# Patient Record
Sex: Male | Born: 1940
Health system: Southern US, Community
[De-identification: ages and names within clinical notes are randomized; demographics above are authoritative.]

## PROBLEM LIST (undated history)

## (undated) DIAGNOSIS — Z972 Presence of dental prosthetic device (complete) (partial): Secondary | ICD-10-CM

## (undated) DIAGNOSIS — R7303 Prediabetes: Secondary | ICD-10-CM

## (undated) DIAGNOSIS — F321 Major depressive disorder, single episode, moderate: Secondary | ICD-10-CM

## (undated) DIAGNOSIS — H269 Unspecified cataract: Secondary | ICD-10-CM

## (undated) DIAGNOSIS — I10 Essential (primary) hypertension: Secondary | ICD-10-CM

## (undated) DIAGNOSIS — F419 Anxiety disorder, unspecified: Secondary | ICD-10-CM

## (undated) DIAGNOSIS — H409 Unspecified glaucoma: Secondary | ICD-10-CM

## (undated) DIAGNOSIS — J449 Chronic obstructive pulmonary disease, unspecified: Secondary | ICD-10-CM

## (undated) DIAGNOSIS — E785 Hyperlipidemia, unspecified: Secondary | ICD-10-CM

## (undated) DIAGNOSIS — C4491 Basal cell carcinoma of skin, unspecified: Secondary | ICD-10-CM

## (undated) HISTORY — DX: Prediabetes: R73.03

## (undated) HISTORY — DX: Anxiety disorder, unspecified: F41.9

## (undated) HISTORY — PX: TONSILLECTOMY: SUR1361

## (undated) HISTORY — DX: Basal cell carcinoma of skin, unspecified: C44.91

## (undated) HISTORY — PX: EYE SURGERY: SHX253

## (undated) HISTORY — DX: Chronic obstructive pulmonary disease, unspecified: J44.9

## (undated) HISTORY — DX: Hyperlipidemia, unspecified: E78.5

## (undated) HISTORY — PX: WISDOM TOOTH EXTRACTION: SHX21

## (undated) HISTORY — DX: Unspecified glaucoma: H40.9

---
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
## 2001-03-14 ENCOUNTER — Encounter: Payer: Self-pay | Admitting: Family Medicine

## 2001-03-14 ENCOUNTER — Ambulatory Visit (HOSPITAL_COMMUNITY): Admission: RE | Admit: 2001-03-14 | Discharge: 2001-03-14 | Payer: Self-pay | Admitting: Family Medicine

## 2001-03-21 ENCOUNTER — Encounter: Payer: Self-pay | Admitting: Family Medicine

## 2001-03-21 ENCOUNTER — Ambulatory Visit (HOSPITAL_COMMUNITY): Admission: RE | Admit: 2001-03-21 | Discharge: 2001-03-21 | Payer: Self-pay | Admitting: Family Medicine

## 2002-03-26 ENCOUNTER — Other Ambulatory Visit: Admission: RE | Admit: 2002-03-26 | Discharge: 2002-03-26 | Payer: Self-pay | Admitting: Dermatology

## 2002-04-20 ENCOUNTER — Ambulatory Visit (HOSPITAL_COMMUNITY): Admission: RE | Admit: 2002-04-20 | Discharge: 2002-04-20 | Payer: Self-pay | Admitting: Internal Medicine

## 2002-05-27 ENCOUNTER — Other Ambulatory Visit: Admission: RE | Admit: 2002-05-27 | Discharge: 2002-05-27 | Payer: Self-pay | Admitting: Dermatology

## 2003-07-29 ENCOUNTER — Other Ambulatory Visit: Admission: RE | Admit: 2003-07-29 | Discharge: 2003-07-29 | Payer: Self-pay | Admitting: Unknown Physician Specialty

## 2005-03-07 ENCOUNTER — Ambulatory Visit (HOSPITAL_COMMUNITY): Admission: RE | Admit: 2005-03-07 | Discharge: 2005-03-07 | Payer: Self-pay | Admitting: Family Medicine

## 2005-03-13 ENCOUNTER — Other Ambulatory Visit: Admission: RE | Admit: 2005-03-13 | Discharge: 2005-03-13 | Payer: Self-pay | Admitting: Dermatology

## 2005-11-13 ENCOUNTER — Ambulatory Visit: Payer: Self-pay | Admitting: *Deleted

## 2005-11-23 ENCOUNTER — Ambulatory Visit: Payer: Self-pay | Admitting: *Deleted

## 2005-11-23 ENCOUNTER — Encounter (HOSPITAL_COMMUNITY): Admission: RE | Admit: 2005-11-23 | Discharge: 2005-12-23 | Payer: Self-pay | Admitting: *Deleted

## 2005-11-30 ENCOUNTER — Ambulatory Visit: Payer: Self-pay | Admitting: *Deleted

## 2005-12-03 ENCOUNTER — Ambulatory Visit: Payer: Self-pay | Admitting: *Deleted

## 2005-12-03 ENCOUNTER — Inpatient Hospital Stay (HOSPITAL_BASED_OUTPATIENT_CLINIC_OR_DEPARTMENT_OTHER): Admission: RE | Admit: 2005-12-03 | Discharge: 2005-12-03 | Payer: Self-pay | Admitting: *Deleted

## 2005-12-30 HISTORY — PX: CARDIAC CATHETERIZATION: SHX172

## 2005-12-31 ENCOUNTER — Ambulatory Visit: Payer: Self-pay | Admitting: *Deleted

## 2007-03-17 ENCOUNTER — Ambulatory Visit (HOSPITAL_COMMUNITY): Admission: RE | Admit: 2007-03-17 | Discharge: 2007-03-17 | Payer: Self-pay | Admitting: Ophthalmology

## 2007-03-17 ENCOUNTER — Encounter (INDEPENDENT_AMBULATORY_CARE_PROVIDER_SITE_OTHER): Payer: Self-pay | Admitting: Ophthalmology

## 2008-04-20 ENCOUNTER — Ambulatory Visit (HOSPITAL_BASED_OUTPATIENT_CLINIC_OR_DEPARTMENT_OTHER): Admission: RE | Admit: 2008-04-20 | Discharge: 2008-04-20 | Payer: Self-pay | Admitting: Orthopedic Surgery

## 2008-04-20 ENCOUNTER — Encounter (INDEPENDENT_AMBULATORY_CARE_PROVIDER_SITE_OTHER): Payer: Self-pay | Admitting: Orthopedic Surgery

## 2008-07-02 HISTORY — PX: HAND SURGERY: SHX662

## 2009-04-15 ENCOUNTER — Ambulatory Visit (HOSPITAL_COMMUNITY): Admission: RE | Admit: 2009-04-15 | Discharge: 2009-04-15 | Payer: Self-pay | Admitting: Family Medicine

## 2009-12-26 DIAGNOSIS — J449 Chronic obstructive pulmonary disease, unspecified: Secondary | ICD-10-CM

## 2009-12-26 DIAGNOSIS — R1032 Left lower quadrant pain: Secondary | ICD-10-CM | POA: Insufficient documentation

## 2009-12-26 DIAGNOSIS — E781 Pure hyperglyceridemia: Secondary | ICD-10-CM

## 2009-12-27 ENCOUNTER — Ambulatory Visit: Payer: Self-pay | Admitting: Urgent Care

## 2009-12-27 DIAGNOSIS — K589 Irritable bowel syndrome without diarrhea: Secondary | ICD-10-CM

## 2009-12-27 DIAGNOSIS — F411 Generalized anxiety disorder: Secondary | ICD-10-CM

## 2009-12-27 DIAGNOSIS — R195 Other fecal abnormalities: Secondary | ICD-10-CM | POA: Insufficient documentation

## 2009-12-29 ENCOUNTER — Encounter: Payer: Self-pay | Admitting: Internal Medicine

## 2009-12-30 HISTORY — PX: COLONOSCOPY: SHX174

## 2010-01-05 ENCOUNTER — Encounter: Payer: Self-pay | Admitting: Internal Medicine

## 2010-01-17 ENCOUNTER — Ambulatory Visit: Payer: Self-pay | Admitting: Internal Medicine

## 2010-01-17 ENCOUNTER — Ambulatory Visit (HOSPITAL_COMMUNITY): Admission: RE | Admit: 2010-01-17 | Discharge: 2010-01-17 | Payer: Self-pay | Admitting: Internal Medicine

## 2010-01-19 ENCOUNTER — Encounter: Payer: Self-pay | Admitting: Internal Medicine

## 2010-01-20 ENCOUNTER — Encounter: Payer: Self-pay | Admitting: Internal Medicine

## 2010-08-03 NOTE — Assessment & Plan Note (Signed)
Summary: CONSULT TCS/LLQ PAIN X 1 MONTH/SS   Visit Type:  Initial Consult Referring Provider:  Dr [name] Primary Care Provider:  Dr [name]  Chief Complaint:  abd pain, 1 month ago, and time for tcs.  History of Present Illness: c/o constipation, LLQ pain lasted approx 10 days.  Took clear lax prn from [employer] and eased pain.  Was having BM q 3 days, lifelong constipation.  Tried fiber previously.  Occ diarrhea.  Hx IBS.  Denies rectal bleeding or melena.  Denies fever or chills.  Was eating a lot popcorn.  Denies heartburn, indigestion, nausea or vomiting.  Weight down 13# intentionally over past 8 months by decreasing calories & mowing yards.    Current Problems (verified): 1)  Ibs  (ICD-564.1) 2)  Anxiety  (ICD-300.00) 3)  Abdominal Pain, Left Lower Quadrant  (ICD-789.04) 4)  Hypertriglyceridemia  (ICD-272.1) 5)  COPD  (ICD-496)  Current Medications (verified): 1)  Ambien 10 Mg Tabs (Zolpidem Tartrate) .... At Bedtime As Needed 2)  Saw Palmetto Plus  Caps (Misc Natural Products) .... At Bedtime 3)  Flaxseed  Misc (Flaxseed) .... Two Times A Day 4)  Pravastatin Sodium 20 Mg Tabs (Pravastatin Sodium) .... Once Daily 5)  Fenofibrate 160 Mg Tabs (Fenofibrate) .... Once Daily  Allergies (verified): No Known Drug Allergies  Past History:  Past Medical History: ANXIETY (ICD-300.00) ABDOMINAL PAIN, LEFT LOWER QUADRANT (ICD-789.04) HYPERTRIGLYCERIDEMIA (ICD-272.1) COPD (ICD-496) BPH Basal cell skin ca IBS  Family History: No known family history of colorectal carcinoma, IBD, liver problems. 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 quit 3 yrs ago 60 pkyr hx Alcohol Use - no Illicit Drug Use - no Patient gets regular exercise. Smoking Status:  quit Drug Use:  no Does  Patient Exercise:  yes  Review of Systems General:  Denies fever, chills, sweats, anorexia, fatigue, weakness, malaise, weight loss, and sleep disorder. CV:  Denies chest pains, angina, palpitations, syncope, dyspnea on exertion, orthopnea, PND, peripheral edema, and claudication. Resp:  Complains of dyspnea with exercise; denies dyspnea at rest, cough, sputum, wheezing, coughing up blood, and pleurisy. GI:  Denies difficulty swallowing, pain on swallowing, nausea, indigestion/heartburn, vomiting, vomiting blood, jaundice, gas/bloating, change in bowel habits, bloody BM's, black BMs, and fecal incontinence. GU:  Denies urinary burning, blood in urine, urinary frequency, urinary hesitancy, nocturnal urination, and urinary incontinence. Derm:  Denies rash, itching, dry skin, hives, moles, warts, and unhealing ulcers. Psych:  Denies depression, anxiety, memory loss, suicidal ideation, hallucinations, paranoia, phobia, and confusion. Heme:  Denies bruising, bleeding, and enlarged lymph nodes.  Vital Signs:  Patient profile:   70 year old male Height:      68 inches Weight:      170 pounds BMI:     25.94 Temp:     97.7 degrees F oral Pulse rate:   88 / minute BP sitting:   122 / 84  (left arm) Cuff size:   regular  Vitals Entered By: Hendricks Limes LPN (December 27, 2009 10:20 AM)  Physical Exam  General:  Well developed, well nourished, no acute distress. Head:  Normocephalic and atraumatic x scars left forehead prev lens replacement Eyes:  Sclera clear, no icterus. Ears:  Normal auditory acuity. Nose:  No deformity, discharge,  or lesions. Mouth:  No deformity or lesions, dentition normal. Neck:  Supple; no masses or thyromegaly. Lungs:  Clear throughout to auscultation. Heart:  Regular rate and rhythm; no murmurs, rubs,  or bruits. Abdomen:  Soft, nontender and nondistended. No masses, hepatosplenomegaly or hernias noted. Normal bowel sounds.without guarding and without rebound.     Rectal:  hemoccult positive.  somewhat mod dilated rectum.  No ext/int masses palpated. Msk:  Symmetrical with no gross deformities. Normal posture. Pulses:  Normal pulses noted. Extremities:  No clubbing, cyanosis, edema or deformities noted. Neurologic:  Alert and  oriented x4;  grossly normal neurologically. Skin:  Intact without significant lesions or rashes. Cervical Nodes:  No significant cervical adenopathy. Inguinal Nodes:  No significant inguinal adenopathy. Psych:  Alert and cooperative. Normal mood and affect.  Impression & Recommendations:  Problem # 1:  BLOOD IN STOOL, OCCULT (ICD-792.1) 70 y/o caucasian male w/ hx IBS-constipation predominate had 10-day history of LLQ pain that has resolved. Has was found to be hemocult positive in office today.  We do need to r/o colorectal carcinoma, and other differentials include flare IBS-C w/ benign source for heme positive stool.  Diagnostic colonoscopy to be performed by Dr. Suszanne Conners Rourk in the near future.  I have discussed risks and benefits which include, but are not limited to, bleeding, infection, perforation, or medication reaction.  The patient agrees with this plan and consent will be obtained.  Orders: Consultation Level III (16109) Hemoccult Guaiac-1 spec.(in office) (82270)  Problem # 2:  IBS (ICD-564.1) See #1  Problem # 3:  ABDOMINAL PAIN, LEFT LOWER QUADRANT (ICD-789.04) See #1  Patient Instructions: 1)  If pain returns, please call our office or go directly to ER

## 2010-08-03 NOTE — Letter (Signed)
Summary: referral from dr Lorin Picket luking  referral from dr scott luking   Imported By: Rosine Beat 01/05/2010 15:09:08  _____________________________________________________________________  External Attachment:    Type:   Image     Comment:   External Document

## 2010-08-03 NOTE — Letter (Signed)
Summary: Patient Notice, Colon Biopsy Results  [facility]  [address]   [place], [place] [address]   Phone: [phone]  Fax: [phone]       January 19, 2010   JOFFRE LUCKS [address] [place], [place]  [address] 1941-06-27    Dear Mr. Willets,  I am pleased to inform you that the biopsies taken during your recent colonoscopy did not show any evidence of cancer upon pathologic examination.  Additional information/recommendations:  Continue with the treatment plan as outlined on the day of your exam.  You should have a repeat colonoscopy examination  in 10 years.  Please call us if you are having persistent problems or have questions about your condition that have not been fully answered at this time.  Sincerely,    R. Roetta Sessions MD, FACP [facility] Ph: [phone]    Fax: [phone]   Appended Document: Patient Notice, Colon Biopsy Results mailed letter to pt  Appended Document: Patient Notice, Colon Biopsy Results REMINDER IN COMPUTER

## 2010-08-03 NOTE — Letter (Signed)
Summary: insurance confirmation  insurance confirmation   Imported By: Minna Merritts 01/20/2010 17:40:48  _____________________________________________________________________  External Attachment:    Type:   Image     Comment:   External Document

## 2010-08-03 NOTE — Letter (Signed)
Summary: Internal Other  Internal Other   Imported By: Peggyann Shoals 12/29/2009 14:09:11  _____________________________________________________________________  External Attachment:    Type:   Image     Comment:   External Document

## 2010-10-05 LAB — BLOOD GAS, ARTERIAL
Acid-Base Excess: 0.8 mmol/L (ref 0.0–2.0)
pCO2 arterial: 39.6 mmHg (ref 35.0–45.0)
pH, Arterial: 7.415 (ref 7.350–7.450)
pO2, Arterial: 92.3 mmHg (ref 80.0–100.0)

## 2010-11-14 NOTE — Op Note (Signed)
George Meyer, George Meyer             ACCOUNT NO.:  [id number]   MEDICAL RECORD NO.:  [id number]          PATIENT TYPE:  AMB   LOCATION:  DSC                          FACILITY:  [facility]   PHYSICIAN:  Cindee Salt, M.D.       DATE OF BIRTH:  August 28, 1940   DATE OF PROCEDURE:  04/20/2008  DATE OF DISCHARGE:                               OPERATIVE REPORT   PREOPERATIVE DIAGNOSIS:  Dupuytren contracture, right little finger.   POSTOPERATIVE DIAGNOSIS:  Dupuytren contracture, right little finger.   OPERATION:  Excision of palmar fascia, right little finger and ring  finger with V-Y advancements.   SURGEON:  Cindee Salt, MD   ASSISTANT:  Joaquin Courts. RN   ANESTHESIA:  General.   DATE OF OPERATION:  04/20/2008.   ANESTHESIOLOGIST:  Dr. Jean Rosenthal.   HISTORY:  The patient is a 70 year old male with a history of a palmar  cord contracture of his right little finger.  He is desirous of having  this excised.  He is aware of risks and complications including  infection, recurrence, injury to arteries, nerves, and tendons,  incomplete relief of symptoms, dystrophy, recurrence, and extension.  In  the preoperative area, the patient is seen.  The extremity marked by  both the patient and surgeon.  Antibiotic given.   PROCEDURE:  The patient is brought to the operating room where a general  anesthetic was carried out without difficulty using an LMA.  He was  prepped using DuraPrep, supine position, right arm free.  A time-out was  taken.  The limb was exsanguinated with an Esmarch bandage.  Tourniquet  placed on forearm, was inflated to 250 mmHg.  The planned V-Y  advancements were then marked out on the finger back to the level of the  carpal retinaculum.  The incisions were carried down through  subcutaneous tissue.  Bleeders were electrocauterized.  The dissection  was carried down to the palmar fascia over the retinaculum.  This was  isolated and transected, removing a portion of the cord to the  ring  finger.  The dissection was then carried distally after identification  of the neurovascular bundles both radially and ulnarly.  This was  followed.  The cords were identified.  A large abductor digiti quinti  cord was present along with the cord present around the flexor tendon at  the A2 pulley.  This was excised protecting neurovascular structures.  This was then followed out to the middle phalanx.  The primary cord was  on the ulnar aspect.  Both radial and ulnar digital arteries and nerves  were found to be intact.  The finger came fully straight without  difficulty.  The wound was copiously irrigated with saline.  The V's  were converted to Y's.  A doubled-over  vessel loop drain was placed.  The skin was then closed with interrupted 5-0 Vicryl Rapide sutures.  A  sterile compressive dressing was applied.  Tourniquet deflated.  Bleeders were electrocauterized during the procedure with bipolar.  On  deflation  of the tourniquet, the finger pinked.  A dorsal splint and completion of  the  dressing was performed.  The patient tolerated the procedure well  and was taken to the recovery room for observation in satisfactory  condition.  He will be discharged home to return to [facility] in 1 week on Vicodin.           ______________________________  Cindee Salt, M.D.     GK/MEDQ  D:  04/20/2008  T:  04/20/2008  Job:  [id number]   cc:   Lorin Picket A. Gerda Diss, MD

## 2010-11-14 NOTE — Op Note (Signed)
George Meyer, George Meyer             ACCOUNT NO.:  [id number]   MEDICAL RECORD NO.:  [id number]          PATIENT TYPE:  AMB   LOCATION:  SDS                          FACILITY:  [facility]   PHYSICIAN:  Lanna Poche, M.D. DATE OF BIRTH:  [birth year]   DATE OF PROCEDURE:  03/17/2007  DATE OF DISCHARGE:                               OPERATIVE REPORT   PREOPERATIVE DIAGNOSIS:  Dislocating intraocular lens, left eye.   POSTOPERATIVE DIAGNOSIS:  Dislocating intraocular lens, left eye.   PROCEDURE:  Pars plana vitrectomy, removal of dislocated IOL, secondary  IOL insertion with suture fixation ciliary body, laser photocoagulation  for retinal break, left eye.   SURGEON:  Lanna Poche, M.D.   ASSISTANT:  Bryan Lemma. Lundquist, P.A.   ANESTHESIA:  General endotracheal.   ESTIMATED BLOOD LOSS:  Less than 1 mL.   COMPLICATIONS:  None.   OPERATIVE NOTE:  The patient was taken to the operating room where after  induction of general anesthesia the left eye was prepped and draped in  usual fashion.  Lid speculum was introduced.  Conjunctival peritomy was  developed at 270 degrees sparing the inferonasal quadrant.  Hemostasis  was obtained with eraser cautery.  Sclerotomies were fashioned 3 mm  posterior limbus at 1:30, 10:30 and 4:30.  The suprasclerotomies were  plugged and 4-mm infusion cannula secured at the 4:30 sclerotomy with a  temporary suture of 7-0 Vicryl.  The tip was visually inspected and  found in good position.  A partial-thickness scleral incision of 7 mm  was made 2 mm posterior limbus superiorly and dissecting up to and then  into the anterior chamber for the full cord length.  A temporary suture  of 7-0 Vicryl was placed over this incision.  Landers ring was secured  with 7-0 Vicryl suture at 3 and 9.  Plugs removed and 30-degree  prismatic lens applied to the surface of the eye.  A central core  followed by a peripheral vitrectomy was performed.  There was apparent  posterior hyaloid separation.  The IOL was seen to be displaced  inferiorly.  It was somewhat mobile and then temporarily moved more into  a superior position, but with little manipulation, it was seen to be  fairly unstable and slipped inferiorly again.  It was elected therefore  to remove the IOL or try to place it into the anterior capsular area.  The IOL was brought into anterior capsular area as a support on the  capsule, but the anterior capsule was not deemed to be adequate as the  IOL would be stable in one area but not in the other.  It was elected to  completely remove this IOL.  The suture superiorly was cut. Lens grasped  with a straight forceps and brought out through the opening.  The  opening was temporally sutured.  Landers ring was removed and plugs  placed in the superior hole.  Scleral depression revealed there to be no  clear retinal breaks or tears but several areas of vitreoretinal pumping  temporally.  This was prophylactically surrounded with a  laser.  Attention was then  directed back to the eye where partial-thickness  scleral flaps were made at 3 and 9.  The 10-0 Prolene suture was passed  from one flap to the other with egress guided with a 27-gauge needle.  The suture was externalized, brought out superiorly and tied to both  ends of the IOL.  The IOL was a CZ70BD, serial number [id number] of  19.0 diopters power and found to be free of defect.  Once the end of the  sutures had been secured to the eye lid, the IOL was placed into the  anterior aspect of posterior chamber anterior to the sulcus.  Partial-  thickness scleral bites were made on each side with a 10-0 Prolene and  the IOL secured in place.  It was well centered.  The superior wound was  closed with interrupted stitches of 10-0 nylon.  The sclerotomies were  closed with 7-0 Vicryl.  The flaps were tied down with 7-0 Vicryl.  The  infusion capsule removed and preplaced suture secured.  The  conjunctiva  was brought up and  reapproximated with running sutures of 6-0 plain  gut.  Pressure was checked with a Baer keratometer and found to lay at  approximately 15.  Lid speculum was then removed and subconjunctival  irrigation 0.75% Marcaine followed by subconjunctival injection of 100  mg of ceftazidime and 10 mg of Decadron were given.  Mixed antibiotic  ointment was then placed over the patient's eye.  Eye patch and shield  was placed over the patient's eye, and the patient left the operating  room in stable condition.           ______________________________  Lanna Poche, M.D.     JTH/MEDQ  D:  03/17/2007  T:  03/18/2007  Job:  [id number]

## 2010-11-17 NOTE — Op Note (Signed)
NAME:  George Meyer, George Meyer                       ACCOUNT NO.:  [id number]   MEDICAL RECORD NO.:  [id number]                   PATIENT TYPE:  AMB   LOCATION:  DAY                                  FACILITY:  [facility]   PHYSICIAN:  R. Roetta Sessions, M.D.              DATE OF BIRTH:  [birth year]   DATE OF PROCEDURE:  DATE OF DISCHARGE:                                 OPERATIVE REPORT   PROCEDURE:  Screening colonoscopy.   INDICATION FOR PROCEDURE:  The patient is a 70 year old gentleman sent here  by the courtesy of Scott A. Gerda Diss, M.D., for colorectal cancer screening  via colonoscopy.  The patient is devoid of any lower GI tract symptoms, no  family history of colorectal neoplasia, and he has never had any prior  imaging of his colon.  Colonoscopy is now being done as a standard screening  maneuver.  The approach has been discussed with the patient at length at the  bedside.  The potential risks, benefits, and alternatives have been reviewed  and any questions answered and he is agreeable.  Please see my handwritten  H&P for more information.  He is at low risk for conscious sedation with  Versed and Demerol.   DESCRIPTION OF PROCEDURE:  The patient was placed in the left lateral  decubitus position.  O2 saturation, blood pressure, pulse, and respirations  were monitored throughout the entire procedure.  Conscious sedation with  Versed 6 mg IV, Demerol 75 mg IV in divided doses.   Instrument:  Olympus video chip adult colonoscope.   Findings:  Digital rectal exam revealed no abnormalities.   Endoscopic findings:  Prep was excellent.   Rectum:  Examination of the rectal mucosa, including  retroflexed view and  anteversion, revealed no abnormalities.   Colon:  The colonic mucosa was surveyed from the rectosigmoid junction  through the left, transverse, and right colon to the area of the appendiceal  orifice, the ileocecal valve, and cecum.  These structures were well-seen  and  photographed for the record.  No colonic mucosal abnormalities were  noted upon advancing the scope to the cecum.  From the level of the cecum  and ileocecal valve, the scope was slowly and cautiously withdrawn.  All  previously-mentioned mucosal surfaces were again seen, and again no  abnormalities were observed.  The patient tolerated the procedure well and  was reactivated in endoscopy.    IMPRESSION:  1. Normal rectum.  2. Normal colon.   RECOMMENDATIONS:  Repeat colonoscopy 10 years.                                                 Jonathon Bellows, M.D.    RMR/MEDQ  D:  04/20/2002  T:  04/20/2002  Job:  [id number]   cc:  Scott A. Gerda Diss, M.D.

## 2010-11-17 NOTE — Procedures (Signed)
NAMECOLEBY, YETT NO.:  [id number]   MEDICAL RECORD NO.:  [id number]           PATIENT TYPE:  REC   LOCATION:                                FACILITY:  [facility]   PHYSICIAN:  Vida Roller, M.D.   DATE OF BIRTH:  [birth year]   DATE OF PROCEDURE:  11/23/2005  DATE OF DISCHARGE:  11/23/2005                                    STRESS TEST   HISTORY:  Mr. Mcmanaway is a 70 year old male with no known coronary disease  with atypical chest discomfort.  Cardiac risk factors include age, sex,  tobacco and family history.   BASELINE DATA:  Electrocardiogram reveals a sinus rhythm at 68 beats per  minute with right bundle branch block pattern.   Patient exercised for a total of 9 minutes and 25 seconds and 10.1 METS.  Maximal heart rate achieved was 150 beats per minute which is 97% of  predicted,  maximum blood pressure 150/80, resolved down to 138/80 in  recovery.  The patient reports mild shortness of breath at end of exercise,  no chest discomfort was noted.  No ischemic changes was noted.   Final images and results are pending MD review.      Jae Dire, P.A. [facility]      Vida Roller, M.D.  Electronically Signed    AB/MEDQ  D:  11/23/2005  T:  11/23/2005  Job:  [id number]

## 2010-11-17 NOTE — Procedures (Signed)
NAMEMUNIR, VICTORIAN NO.:  [id number]   MEDICAL RECORD NO.:  [id number]           PATIENT TYPE:  REC   LOCATION:  RADP                          FACILITY:  [facility]   PHYSICIAN:  Vida Roller, M.D.   DATE OF BIRTH:  1941/02/01   DATE OF PROCEDURE:  11/23/2005  DATE OF DISCHARGE:                                  ECHOCARDIOGRAM   There is no primary physician.   Tape No. [id number], tape count [id number].   A 70 year old man with chest pain.   The technical quality of this study is adequate.   M-MODE TRACINGS:  The aorta is 34 mm.   Left atrium is 31 mm.   Septum is 9 mm.   Posterior wall is 9 mm.   The left ventricular diastolic dimension is 45 mm.   Left ventricular systolic dimension 24 mm.   2-D AND DOPPLER IMAGING:  The left ventricle is normal size.  There is  preserved LV systolic function with an ejection fraction of 60-65%.  There  are no wall motion abnormalities seen.   The right ventricle is normal size with normal systolic function.   Both atria appear to be normal size.   The aortic valve is without significant stenosis or regurgitation.   Mitral valve with trivial regurgitation.  No stenosis is seen.   Tricuspid valve with trivial regurgitation.   No pericardial effusion.   Inferior vena cava appears to be normal size.      Vida Roller, M.D.  Electronically Signed     JH/MEDQ  D:  11/23/2005  T:  11/24/2005  Job:  [id number]

## 2010-11-17 NOTE — Cardiovascular Report (Signed)
George Meyer, George Meyer NO.:  [id number]   MEDICAL RECORD NO.:  [id number]          PATIENT TYPE:  OIB   LOCATION:  1966                         FACILITY:  [facility]   PHYSICIAN:  Vida Roller, M.D.   DATE OF BIRTH:  1941-03-22   DATE OF PROCEDURE:  12/03/2005  DATE OF DISCHARGE:                              CARDIAC CATHETERIZATION   PRIMARY:  Dr. [name].   HISTORY OF PRESENT ILLNESS:  George Meyer is a 70 year old man with cardiac  risk factors and chest discomfort who presented for evaluation and had a  Myoview stress test which showed question of an anterior defect.  He was  referred for heart catheterization.   PROCEDURES PERFORMED:  1.  Left heart catheterization  2.  Selective coronary angiography.  3.  Left ventriculography, all in the right femoral artery approach.   DETAILS OF PROCEDURE:  After obtaining informed consent, the patient was  brought to the cardiac catheterization laboratory in the fasting state.  There, he was prepped and draped in the usual sterile manner.  Local  anesthetic was obtained over the right groin using 1% lidocaine without  epinephrine.  The right femoral artery was cannulated using the modified  Seldinger technique with a 4-French, 10-cm sheath.  Left heart  catheterization was performed using a Judkins left #4, a 3D RC catheter and  an angled pigtail catheter, all 4-French.  At the conclusion of the  procedure, the catheters were removed.  The patient was moved back to the  cardiology holding area where the femoral artery sheath was removed.  Hemostasis was obtained using direct manual pressure.  At the conclusion of  the hold, there was no evidence of ecchymosis or hematoma formation, and  distal pulses were intact.  Total fluoroscopic time was 2.7 minutes.  Total  ionized contrast 2.8 minutes.  Total ionized contrast was 75 mL.   RESULTS:  Aortic pressure 133/80, mean of 104 mmHg.   Left ventricular pressure 133/8  with an end-diastolic pressure of 14 mmHg.   SELECTIVE CORONARY ANGIOGRAPHY:  The left main coronary artery is a moderate  caliber vessel which is angiographically free of disease.   Left anterior descending coronary artery is a moderate caliber vessel with  two branching diagonal vessels.  It has some luminal irregularities in the  distal portion of the artery after the takeoff of the second diagonal which  is nonobstructive.   The left circumflex coronary artery is a moderate caliber nondominant vessel  with several branching obtuse marginals.  It has no significant obstructive  disease.   The right coronary artery is a moderate caliber dominant vessel with a  moderate caliber posterior descending coronary artery and several posterior  lateral branches.  It has 725% stenosis in the mid portion of the artery  just prior to the take off of an acute marginal.  The acute marginal system  is relatively large and branching and is without significant obstructive  disease.   Left ventriculogram reveals preserved LV systolic function estimated at 60%  with no wall motion abnormalities and no mitral regurgitation.   ASSESSMENT:  1.  Nonobstructive coronary disease.  2.  Normal LV systolic function.  3.  Normal left heart pressures.   PLAN:  Plan then is medical therapy for risk factors and will follow him up  in the regional office in four to six weeks.      Vida Roller, M.D.  Electronically Signed     JH/MEDQ  D:  12/03/2005  T:  12/04/2005  Job:  [id number]   cc:   Lorin Picket A. Gerda Diss, MD  Fax: [phone]

## 2011-03-10 ENCOUNTER — Emergency Department (HOSPITAL_COMMUNITY)
Admission: EM | Admit: 2011-03-10 | Discharge: 2011-03-10 | Disposition: A | Discharge status: Home / Self Care | Payer: Self-pay

## 2011-04-02 LAB — POCT HEMOGLOBIN-HEMACUE: Hemoglobin: 14.4

## 2011-04-12 LAB — COMPREHENSIVE METABOLIC PANEL
AST: 29
Alkaline Phosphatase: 49
BUN: 11
CO2: 30
Calcium: 8.9
Chloride: 106
Creatinine, Ser: 0.79
GFR calc Af Amer: >60
Total Protein: 6.3

## 2011-04-12 LAB — URINALYSIS, ROUTINE W REFLEX MICROSCOPIC
Bilirubin Urine: NEGATIVE
Glucose, UA: NEGATIVE
Hgb urine dipstick: NEGATIVE
pH: 5

## 2011-04-12 LAB — CBC
MCHC: 35
Platelets: 403 — ABNORMAL HIGH
RDW: 14.1 — ABNORMAL HIGH

## 2012-09-06 ENCOUNTER — Encounter: Payer: Self-pay | Admitting: *Deleted

## 2012-09-17 ENCOUNTER — Other Ambulatory Visit: Payer: Self-pay | Admitting: *Deleted

## 2012-09-17 MED ORDER — TRAZODONE HCL 150 MG PO TABS: 150.0000 mg | ORAL_TABLET | Freq: Every day | ORAL | Status: DC

## 2012-12-04 ENCOUNTER — Ambulatory Visit (INDEPENDENT_AMBULATORY_CARE_PROVIDER_SITE_OTHER): Payer: Medicare Other | Admitting: Family Medicine

## 2012-12-04 ENCOUNTER — Encounter: Payer: Self-pay | Admitting: Family Medicine

## 2012-12-04 VITALS — BP 122/74 | HR 88 | Temp 98.7°F | Wt 175.0 lb

## 2012-12-04 DIAGNOSIS — E781 Pure hyperglyceridemia: Secondary | ICD-10-CM

## 2012-12-04 DIAGNOSIS — R5383 Other fatigue: Secondary | ICD-10-CM | POA: Insufficient documentation

## 2012-12-04 DIAGNOSIS — R195 Other fecal abnormalities: Secondary | ICD-10-CM

## 2012-12-04 DIAGNOSIS — J449 Chronic obstructive pulmonary disease, unspecified: Secondary | ICD-10-CM

## 2012-12-04 DIAGNOSIS — R5381 Other malaise: Secondary | ICD-10-CM | POA: Insufficient documentation

## 2012-12-04 NOTE — Progress Notes (Signed)
  Subjective:    Patient ID: George Meyer, male    DOB: 1941-02-16, 72 y.o.   MRN: [id number]  HPI feeling fatigue for 1 week, with "cold sweats", denies sob, headaches, visual changes.  No new medications.   Patient does have a history of CBD using his medicines he denies excessive shortness of breath he does state a fair amount of fatigue tiredness does not have the energy he used to this been going on in the past several weeks. He denies any vomiting diarrhea he denies any type of bloody stools or hematuria. PMH-COPD, irritable bowel, Hyperlipidemia Family history noncontributory Social no longer smokes Review of Systems Negative for chest pain shortness of breath vomiting diarrhea bloody stools.    Objective:   Physical Exam  Lungs are clear, hearts regular, pulse normal, abdomen soft prostate normal extremities no edema no rash noted neck no masses throat normal ears normal sinus nontender neurologic grossly normal      Assessment & Plan:  Significant fatigue-hard to explain currently will do lab work. It is possible patient may need to have further testing followup within 2 weeks await the results of the lab work as well.

## 2012-12-05 LAB — CBC WITH DIFFERENTIAL/PLATELET
Eosinophils Absolute: 0.6 10*3/uL (ref 0.0–0.7)
Lymphocytes Relative: 24 % (ref 12–46)
Lymphs Abs: 2.9 10*3/uL (ref 0.7–4.0)
MCHC: 34.8 g/dL (ref 30.0–36.0)
MCV: 89.4 fL (ref 78.0–100.0)
WBC: 11.9 10*3/uL — ABNORMAL HIGH (ref 4.0–10.5)

## 2012-12-05 LAB — LIPID PANEL
Cholesterol: 148 mg/dL (ref 0–200)
LDL Cholesterol: 81 mg/dL (ref 0–99)
Triglycerides: 155 mg/dL — ABNORMAL HIGH (ref <150)
VLDL: 31 mg/dL (ref 0–40)

## 2012-12-05 LAB — BASIC METABOLIC PANEL
BUN: 14 mg/dL (ref 6–23)
CO2: 28 mEq/L (ref 19–32)
Chloride: 105 mEq/L (ref 96–112)
Glucose, Bld: 95 mg/dL (ref 70–99)
Sodium: 141 mEq/L (ref 135–145)

## 2012-12-05 LAB — TSH: TSH: 1.597 u[IU]/mL (ref 0.350–4.500)

## 2012-12-06 ENCOUNTER — Other Ambulatory Visit: Payer: Self-pay | Admitting: Family Medicine

## 2012-12-09 ENCOUNTER — Other Ambulatory Visit (INDEPENDENT_AMBULATORY_CARE_PROVIDER_SITE_OTHER): Payer: Medicare Other | Admitting: *Deleted

## 2012-12-09 DIAGNOSIS — K921 Melena: Secondary | ICD-10-CM

## 2012-12-09 DIAGNOSIS — Z0000 Encounter for general adult medical examination without abnormal findings: Secondary | ICD-10-CM

## 2012-12-09 LAB — POC HEMOCCULT BLD/STL (HOME/3-CARD/SCREEN)

## 2012-12-09 NOTE — Addendum Note (Signed)
Addended by: Metro Kung on: 12/09/2012 10:46 AM   Modules accepted: Orders

## 2012-12-18 LAB — CBC WITH DIFFERENTIAL/PLATELET
Eosinophils Relative: 5 % (ref 0–5)
HCT: 48 % (ref 39.0–52.0)
Hemoglobin: 16.3 g/dL (ref 13.0–17.0)
Lymphocytes Relative: 24 % (ref 12–46)
Lymphs Abs: 2.9 10*3/uL (ref 0.7–4.0)
MCV: 91.6 fL (ref 78.0–100.0)
Monocytes Absolute: 0.9 10*3/uL (ref 0.1–1.0)
Monocytes Relative: 8 % (ref 3–12)
Neutrophils Relative %: 62 % (ref 43–77)
Platelets: 456 10*3/uL — ABNORMAL HIGH (ref 150–400)
RBC: 5.24 MIL/uL (ref 4.22–5.81)
RDW: 13.9 % (ref 11.5–15.5)
WBC: 11.9 10*3/uL — ABNORMAL HIGH (ref 4.0–10.5)

## 2012-12-22 ENCOUNTER — Ambulatory Visit (INDEPENDENT_AMBULATORY_CARE_PROVIDER_SITE_OTHER): Payer: Medicare Other | Admitting: Family Medicine

## 2012-12-22 ENCOUNTER — Encounter: Payer: Self-pay | Admitting: Family Medicine

## 2012-12-22 VITALS — BP 134/76 | HR 70 | Ht 67.5 in | Wt 172.4 lb

## 2012-12-22 DIAGNOSIS — D72829 Elevated white blood cell count, unspecified: Secondary | ICD-10-CM | POA: Insufficient documentation

## 2012-12-22 DIAGNOSIS — J449 Chronic obstructive pulmonary disease, unspecified: Secondary | ICD-10-CM

## 2012-12-22 MED ORDER — PREDNISONE 20 MG PO TABS: ORAL_TABLET | ORAL | Status: DC

## 2012-12-22 NOTE — Progress Notes (Signed)
  Subjective:    Patient ID: George Meyer, male    DOB: October 25, 1940, 72 y.o.   MRN: [id number]  HPI Patient is here today for follow up visit on fatigue. He is also here to discuss lab work that was completed recently. He states that he has no concerns today.  Patient does relate that he has had some shortness of breath when he pushes himself but otherwise doing well denies any chest tightness pressure pain he denies fever chills sweats energy level overall doing better appetite doing well no sweats. PMH he states long ago he had slightly elevated WBC. Review of Systems See above. Patient does not smoke. Family history benign.    Objective:   Physical Exam Lungs are Think wheezing not rest for distress heart regular pulse normal extremities no edema skin warm dry neck no masses eardrums normal throat normal       Assessment & Plan:  Leukocytosis-repeat CBC in 6 weeks with followup office visit. This quite possibly could be just how the patient is certainly if progressive troubles as he gets further along we may need to have hematology see him.

## 2013-01-28 LAB — CBC WITH DIFFERENTIAL/PLATELET
Basophils Relative: 1 % (ref 0–1)
HCT: 44.5 % (ref 39.0–52.0)
Lymphocytes Relative: 28 % (ref 12–46)
Lymphs Abs: 3.2 10*3/uL (ref 0.7–4.0)
MCV: 89 fL (ref 78.0–100.0)
Monocytes Absolute: 0.9 10*3/uL (ref 0.1–1.0)
Neutrophils Relative %: 57 % (ref 43–77)
RBC: 5 MIL/uL (ref 4.22–5.81)
RDW: 14.5 % (ref 11.5–15.5)
WBC: 11.4 10*3/uL — ABNORMAL HIGH (ref 4.0–10.5)

## 2013-02-02 ENCOUNTER — Ambulatory Visit (INDEPENDENT_AMBULATORY_CARE_PROVIDER_SITE_OTHER): Payer: Medicare Other | Admitting: Family Medicine

## 2013-02-02 ENCOUNTER — Encounter: Payer: Self-pay | Admitting: Family Medicine

## 2013-02-02 VITALS — BP 122/82 | Ht 68.0 in | Wt 174.0 lb

## 2013-02-02 DIAGNOSIS — D72829 Elevated white blood cell count, unspecified: Secondary | ICD-10-CM

## 2013-02-02 NOTE — Progress Notes (Signed)
  Subjective:    Patient ID: George Meyer, male    DOB: [birth year], 72 y.o.   MRN: [id number]  HPI  Patient arrives to follow up on CBC results. He relates overall he is feeling okay. Not any particular worries or concerns. He is aware that his white blood count has been mildly elevated. No fevers Weight steady, appetite PMH benign no night sweats. Review of Systems  Constitutional: Negative for fever, chills, appetite change and fatigue.  HENT: Negative for congestion and neck pain.   Respiratory: Negative for chest tightness.   Cardiovascular: Negative for chest pain.  Gastrointestinal: Negative for abdominal pain.       Objective:   Physical Exam  Constitutional: He appears well-developed and well-nourished.  HENT:  Head: Normocephalic and atraumatic.  Neck: No thyromegaly present.  Cardiovascular: Normal rate, regular rhythm and normal heart sounds.   No murmur heard. Pulmonary/Chest: Effort normal. He has wheezes (faint). He has no rales.  Abdominal: Soft. He exhibits no distension and no mass. There is no tenderness.  Lymphadenopathy:    He has no cervical adenopathy.          Assessment & Plan:  persistant leukocytosis- refer hematology Copd stable, flu shot in fall, fu ov in 3 months

## 2013-02-10 ENCOUNTER — Encounter (HOSPITAL_COMMUNITY): Payer: Medicare Other | Attending: Internal Medicine

## 2013-02-10 ENCOUNTER — Encounter (HOSPITAL_COMMUNITY): Payer: Self-pay

## 2013-02-10 VITALS — BP 145/83 | HR 64 | Temp 98.0°F | Resp 20 | Ht 66.0 in | Wt 173.8 lb

## 2013-02-10 DIAGNOSIS — Z87891 Personal history of nicotine dependence: Secondary | ICD-10-CM

## 2013-02-10 DIAGNOSIS — D72829 Elevated white blood cell count, unspecified: Secondary | ICD-10-CM

## 2013-02-10 LAB — URINALYSIS, ROUTINE W REFLEX MICROSCOPIC
Glucose, UA: NEGATIVE mg/dL
Hgb urine dipstick: NEGATIVE
Specific Gravity, Urine: >1.03 — ABNORMAL HIGH (ref 1.005–1.030)
pH: 5.5 (ref 5.0–8.0)

## 2013-02-10 LAB — CBC WITH DIFFERENTIAL/PLATELET
Eosinophils Absolute: 0.7 10*3/uL (ref 0.0–0.7)
Eosinophils Relative: 5 % (ref 0–5)
HCT: 46.9 % (ref 39.0–52.0)
Hemoglobin: 15.7 g/dL (ref 13.0–17.0)
Lymphs Abs: 2.9 10*3/uL (ref 0.7–4.0)
Monocytes Relative: 8 % (ref 3–12)
Neutrophils Relative %: 64 % (ref 43–77)
RBC: 5.11 MIL/uL (ref 4.22–5.81)
RDW: 13.8 % (ref 11.5–15.5)

## 2013-02-10 NOTE — Patient Instructions (Addendum)
[facility] Discharge Instructions  RECOMMENDATIONS MADE BY THE CONSULTANT AND ANY TEST RESULTS WILL BE SENT TO YOUR REFERRING PHYSICIAN.  EXAM FINDINGS BY THE PHYSICIAN TODAY AND SIGNS OR SYMPTOMS TO REPORT TO CLINIC OR PRIMARY PHYSICIAN:  We are drawing a BCR-ABL test, checking your Iron, Total Iron Binding Capacity, and Ferritin level also.  We are checking your urine for bacteria.   We want you to return in 2 weeks to followup with the MD    Thank you for choosing [facility] to provide your oncology and hematology care.  To afford each patient quality time with our providers, please arrive at least 15 minutes before your scheduled appointment time.  With your help, our goal is to use those 15 minutes to complete the necessary work-up to ensure our physicians have the information they need to help with your evaluation and healthcare recommendations.    Effective January 1st, 2014, we ask that you re-schedule your appointment with our physicians should you arrive 10 or more minutes late for your appointment.  We strive to give you quality time with our providers, and arriving late affects you and other patients whose appointments are after yours.    Again, thank you for choosing [facility].  Our hope is that these requests will decrease the amount of time that you wait before being seen by our physicians.       _____________________________________________________________  Should you have questions after your visit to [facility], please contact our office at [phone] between the hours of 8:30 a.m. and 5:00 p.m.  Voicemails left after 4:30 p.m. will not be returned until the following business day.  For prescription refill requests, have your pharmacy contact our office with your prescription refill request.

## 2013-02-10 NOTE — Progress Notes (Signed)
[facility]    MEDICAL ONCOLOGY - INITIAL CONSULATION  Referral MD  Reason for Referral: Evaluate leukocytosis   : Chief complaint, no acute complaints   HPI: Patient referred by Dr. Threasa Beards to evaluate leukocytosis. Patient to with no acute complaints. In June he had an office visit and the total white count was high although neutrophils were borderline high normal, you followup on July 30 "white count is high at hemoglobin normal platelets were borderline high on both occasions, today he comes in for evaluation of same issue white count is actually a bit higher his absolute neutrophil borderline high at 8.4. He has no acute illness and no significant intercurrent illness. No headache dizziness chills sweats fever no cough no urinary symptoms no sinus symptoms no subacute or chronic pain he reports no particular distress as her anxieties. He did have a tick bite 2 months ago, he said that he noted the head was probably left in, he developed an irritated area in the skin that was raised inflamed but he went to a dermatologist who frozen in the head came out, this was all month ago, no residual symptoms the area is completely healed he said no discomfort in the area for over 3 weeks     Past Medical History  Diagnosis Date  . Anxiety   . Hyperlipidemia   . COPD (chronic obstructive pulmonary disease)   . Pre-diabetes   . Skin cancer, basal cell   . Glaucoma     both eyes  :  Past Surgical History  Procedure Laterality Date  . Hand surgery Right 2010    dupuytrens excision right little finger  . Cardiac catheterization N/A 12/2005    negative  . Colonoscopy N/A 12/2009    small polyp repeat in 10 years  :  Current Outpatient Prescriptions  Medication Sig Dispense Refill  . albuterol (PROVENTIL) (2.5 MG/3ML) 0.083% nebulizer solution Take 2.5 mg by nebulization every 4 (four) hours as needed for wheezing.      . budesonide-formoterol (SYMBICORT) 160-4.5 MCG/ACT  inhaler Inhale 2 puffs into the lungs 2 (two) times daily.      . dorzolamide-timolol (COSOPT) 22.3-6.8 MG/ML ophthalmic solution Place 1 drop into both eyes 2 (two) times daily.       . fenofibrate 160 MG tablet 160 mg daily.       Marland Kitchen LUMIGAN 0.01 % SOLN Place 1 drop into both eyes daily.       . pravastatin (PRAVACHOL) 20 MG tablet Take 20 mg by mouth daily.      . SAW PALMETTO, SERENOA REPENS, PO Take by mouth daily.      . traZODone (DESYREL) 150 MG tablet Take 1 tablet (150 mg total) by mouth at bedtime.  30 tablet  3  . VENTOLIN HFA 108 (90 BASE) MCG/ACT inhaler INHALE TWO PUFFS EVERY 4 HOURS AS NEEDED  18 each  2   No current facility-administered medications for this visit.     No Known Allergies:  Family History  Problem Relation Age of Onset  . Heart attack Father   . Heart attack Mother   . Lung cancer Brother   :  History   Social History  . Marital Status: Single    Spouse Name: N/A    Number of Children: N/A  . Years of Education: N/A   Occupational History  . Not on file.   Social History Main Topics  . Smoking status: Former Smoker -- 2.00 packs/day for  60 years    Types: Cigarettes    Quit date: 02/29/2012  . Smokeless tobacco: Not on file  . Alcohol Use: No  . Drug Use: No  . Sexually Active: Not on file   Other Topics Concern  . Not on file   Social History Narrative  . No narrative on file  :   a comprehensive 10 point review of systems was negative, except for the patient experiencing some shortness of breath on exertion, he had a brief episode of diuresis 3 months ago without any other associated symptoms and has been evaluated by his primary physician, he has a history of mild BPH and has nocturia x1, he's never had discomfort in the prostate or an abnormal exam   Exam: @IPVITALS @  General:  well-nourished in no acute distress.  Eyes:  no scleral icterus.  ENT: No thrush.  .  Lymphatics:  Negative cervical, supraclavicular, submandibular or  axillary adenopathy.  Respiratory: lungs were clear bilaterally without wheezing or crackles, air entry was symmetrically decreased.  Cardiovascular:  Regular rate and rhythm, .  There was no pedal edema.  GI:  abdomen was soft, flat, nontender, nondistended, without organomegaly or palpable mass.  Muscoloskeletal:  no spinal tenderness of palpation of vertebral spine.  Skin exam was without echymosis, petichae.  Neuro exam was grossly nonfocal. Cranial nerves intact.  Gait was normal.  Patient was alerted and oriented.  Attention was good.   Language was appropriate.  Mood was normal without depression.       Lab Results  Component Value Date   WBC 13.2* 02/10/2013   HGB 15.7 02/10/2013   HCT 46.9 02/10/2013   PLT 419* 02/10/2013   GLUCOSE 95 12/05/2012   CHOL 148 12/05/2012   TRIG 155* 12/05/2012   HDL 36* 12/05/2012   LDLCALC 81 12/05/2012   ALT 17 03/17/2007   AST 29 03/17/2007   NA 141 12/05/2012   K 4.3 12/05/2012   CL 105 12/05/2012   CREATININE 1.07 12/05/2012   BUN 14 12/05/2012   CO2 28 12/05/2012   TSH 1.597 12/05/2012    Additional labs, June/19/14 white count 11.9, neutrophils 7.4, platelets 456, lymphocytes 2.9, monocytes 900, eosinophils 600, repeat on July 30/14 results similar, neutrophils with 6.6, eosinophils 600, total white count 11.4  Pathology none   .  Assessment and Plan:   1.Neutrophilia. No apparent cause. No signs or symptoms of infection inflammation pain distress. He is not a smoker currently. Likely this is spurious possibly due to the steroids in his inhalers although may patient intake same medication without a leukocytosis. The platelets are borderline elevated. There is a possibility that this could be early CML or early myeloproliferative disease or even iron deficient P. vera. Also iron deficiency it can cause borderline high platelets. 2. Significant smoking history with high risk of lung cancer  3. Borderline high normal eosinophils, need to watch               Plan 1. Today  he checked the CBC as above, plus go-ahead and check iron studies and ferritin and a BCR/CML to rule out early stage CML. If abnormal take appropriate action. IF iron studies ABNORMAL would go on and check for JAK mutation to rule out IRON. deficient P. Vera, plus and appropriately recommended for a GI evaluation. Otherwise we'll follow serial CBC, for all cell lines, note also neutrophils,  and determine if it was appropriate to check the JAK mutation and/or epo level.  2.  Will want to advise patient regarding high risk of lung cancer, he is at the upper limit of the age range where noncontrast low-dose spiral CT can be recommended for cancer screening although unfortunately this is not currently covered by Medicare and by most insurance. He should be advised to discuss with his primary care physician and he also might want to look at the available resources including discounted CT scan at the [facility].  Marin Roberts, MD 02/10/2013,3:11 PM

## 2013-02-11 LAB — IRON AND TIBC
Iron: 54 ug/dL (ref 42–135)
Saturation Ratios: 18 % — ABNORMAL LOW (ref 20–55)
TIBC: 305 ug/dL (ref 215–435)

## 2013-02-11 LAB — URINE CULTURE: Colony Count: 7000

## 2013-02-11 LAB — FERRITIN: Ferritin: 192 ng/mL (ref 22–322)

## 2013-02-20 ENCOUNTER — Encounter (HOSPITAL_BASED_OUTPATIENT_CLINIC_OR_DEPARTMENT_OTHER): Payer: Medicare Other

## 2013-02-20 ENCOUNTER — Encounter (HOSPITAL_COMMUNITY): Payer: Self-pay

## 2013-02-20 VITALS — BP 123/73 | HR 72 | Temp 98.0°F | Resp 18 | Wt 176.4 lb

## 2013-02-20 DIAGNOSIS — D7289 Other specified disorders of white blood cells: Secondary | ICD-10-CM

## 2013-02-20 DIAGNOSIS — D729 Disorder of white blood cells, unspecified: Secondary | ICD-10-CM

## 2013-02-20 NOTE — Progress Notes (Signed)
[facility] Telephone:([phone]   [phone]  OFFICE PROGRESS NOTE  George Punt, MD [address] [place] [address]  DIAGNOSIS: Leukocytosis  INTERVAL HISTORY:   George Meyer 72 y.o. male returns to the clinic today for scheduled followup to review the results of labs done for leukocytosis.  Who was seen initially by Dr. Lorre Meyer on 02/10/2013 and other testing done was negative including BCR /ABL PCR. Ferritin was normal and the repeat WBC was 13.2 K with ANC of 8.4K. Monocytes was 1.1K. Patient feels well and has no new problems.  He denies shortness of breath, weight loss, night sweats fever or chills.   MEDICAL HISTORY: Past Medical History  Diagnosis Date  . Anxiety   . Hyperlipidemia   . COPD (chronic obstructive pulmonary disease)   . Pre-diabetes   . Skin cancer, basal cell   . Glaucoma     both eyes    ALLERGIES:  has No Known Allergies.  MEDICATIONS:  Current Outpatient Prescriptions  Medication Sig Dispense Refill  . albuterol (PROVENTIL) (2.5 MG/3ML) 0.083% nebulizer solution Take 2.5 mg by nebulization every 4 (four) hours as needed for wheezing.      . budesonide-formoterol (SYMBICORT) 160-4.5 MCG/ACT inhaler Inhale 2 puffs into the lungs 2 (two) times daily.      . dorzolamide-timolol (COSOPT) 22.3-6.8 MG/ML ophthalmic solution Place 1 drop into both eyes 2 (two) times daily.       . fenofibrate 160 MG tablet 160 mg daily.       Marland Kitchen LUMIGAN 0.01 % SOLN Place 1 drop into both eyes daily.       . pravastatin (PRAVACHOL) 20 MG tablet Take 20 mg by mouth daily.      . SAW PALMETTO, SERENOA REPENS, PO Take by mouth daily.      . traZODone (DESYREL) 150 MG tablet Take 1 tablet (150 mg total) by mouth at bedtime.  30 tablet  3  . VENTOLIN HFA 108 (90 BASE) MCG/ACT inhaler INHALE TWO PUFFS EVERY 4 HOURS AS NEEDED  18 each  2   No current facility-administered medications for this visit.    SURGICAL HISTORY:  Past  Surgical History  Procedure Laterality Date  . Hand surgery Right 2010    dupuytrens excision right little finger  . Cardiac catheterization N/A 12/2005    negative  . Colonoscopy N/A 12/2009    small polyp repeat in 10 years      PHYSICAL EXAMINATION:  Blood pressure 123/73, pulse 72, temperature 98 F (36.7 C), temperature source Oral, resp. rate 18, weight 176 lb 6.4 oz (80.015 kg). GENERAL: No acute distress. NEURO: Alert & oriented    LABORATORY DATA: Lab Results  Component Value Date   WBC 13.2* 02/10/2013   HGB 15.7 02/10/2013   HCT 46.9 02/10/2013   MCV 91.8 02/10/2013   PLT 419* 02/10/2013      Chemistry      Component Value Date/Time   NA 141 12/05/2012 0715   K 4.3 12/05/2012 0715   CL 105 12/05/2012 0715   CO2 28 12/05/2012 0715   BUN 14 12/05/2012 0715   CREATININE 1.07 12/05/2012 0715   CREATININE 0.79 03/17/2007 0825      Component Value Date/Time   CALCIUM 9.4 12/05/2012 0715   ALKPHOS 49 03/17/2007 0825   AST 29 03/17/2007 0825   ALT 17 03/17/2007 0825   BILITOT 0.9 03/17/2007 0825       RADIOGRAPHIC STUDIES:  No results found.   ASSESSMENT:  The patient most likely has a reactive neutrophilia , I suspect that this may be related to steroid inhalers There is no evidence of lymphoproliferative disorder at this time and WBC elevation is only marginal. PCR for BCR /ABL transcript was negative.   PLAN:  1.6 monthly followup visits. 2. Patient reassured that no evidence of malignancy exists at this time 3. Will repeat CBC with diff at that time. .   All questions were satisfactorily answered. Patient knows to call if  any concern arises.  I spent more than 50 % counseling the patient face to face. The total time spent in the appointment was 30 minutes.   Sherral Hammers, MD FACP. Hematology/Oncology.

## 2013-02-20 NOTE — Patient Instructions (Addendum)
[facility] Discharge Instructions  RECOMMENDATIONS MADE BY THE CONSULTANT AND ANY TEST RESULTS WILL BE SENT TO YOUR REFERRING PHYSICIAN.  EXAM FINDINGS BY THE PHYSICIAN TODAY AND SIGNS OR SYMPTOMS TO REPORT TO CLINIC OR PRIMARY PHYSICIAN: Discussion by Dr. Sharia Reeve.  Nothing needs to be done at this time.  Will monitor only.  MEDICATIONS PRESCRIBED:  none  INSTRUCTIONS GIVEN AND DISCUSSED: Report fevers, night sweats, unexplained weight loss, etc.  SPECIAL INSTRUCTIONS/FOLLOW-UP: Follow-up in 6 months with blood work and MD visit.  Thank you for choosing [facility] to provide your oncology and hematology care.  To afford each patient quality time with our providers, please arrive at least 15 minutes before your scheduled appointment time.  With your help, our goal is to use those 15 minutes to complete the necessary work-up to ensure our physicians have the information they need to help with your evaluation and healthcare recommendations.    Effective January 1st, 2014, we ask that you re-schedule your appointment with our physicians should you arrive 10 or more minutes late for your appointment.  We strive to give you quality time with our providers, and arriving late affects you and other patients whose appointments are after yours.    Again, thank you for choosing [facility].  Our hope is that these requests will decrease the amount of time that you wait before being seen by our physicians.       _____________________________________________________________  Should you have questions after your visit to [facility], please contact our office at [phone] between the hours of 8:30 a.m. and 5:00 p.m.  Voicemails left after 4:30 p.m. will not be returned until the following business day.  For prescription refill requests, have your pharmacy contact our office with your prescription refill request.

## 2013-03-02 ENCOUNTER — Other Ambulatory Visit: Payer: Self-pay | Admitting: Family Medicine

## 2013-04-13 ENCOUNTER — Other Ambulatory Visit: Payer: Self-pay | Admitting: Family Medicine

## 2013-05-06 ENCOUNTER — Ambulatory Visit (INDEPENDENT_AMBULATORY_CARE_PROVIDER_SITE_OTHER): Payer: Medicare Other | Admitting: Family Medicine

## 2013-05-06 ENCOUNTER — Encounter: Payer: Self-pay | Admitting: Family Medicine

## 2013-05-06 VITALS — BP 130/86 | Ht 68.0 in | Wt 174.2 lb

## 2013-05-06 DIAGNOSIS — Z23 Encounter for immunization: Secondary | ICD-10-CM

## 2013-05-06 DIAGNOSIS — J449 Chronic obstructive pulmonary disease, unspecified: Secondary | ICD-10-CM

## 2013-05-06 DIAGNOSIS — E781 Pure hyperglyceridemia: Secondary | ICD-10-CM

## 2013-05-06 NOTE — Progress Notes (Signed)
  Subjective:    Patient ID: George Meyer, male    DOB: October 21, 1940, 72 y.o.   MRN: [id number]  HPI  Patient arrives to follow up on cholesterol. Patient does try to watch the fats in his diet. He has also try to stay physically active he feels his health problems are doing well. He denies any wheezing currently denies any significant shortness breath no nausea vomiting diarrhea he does have history of leukocytosis but hematology recently told him to followup 6 months. PMH COPD hyperlipidemia leukocytosis family history noncontributory  Review of Systems Denies fever chills sweats nausea or diarrhea    Objective:   Physical Exam  Lungs clear hearts regular pulse normal extremities no edema skin warm dry blood pressure      Assessment & Plan:  #1 hyperlipidemia patient is doing a good job taken fenofibrate he is minimizing starches in the diet as well as cholesterol. His most recent lab work several months back showed triglyceride mildly elevated but I do not believe that this needs to be repeated at this point in time I recommend repeating and again in the spring  #2 COPD under good control currently warning signs regarding infections and what to watch for were discussed. Flu vaccine up-to-date.  #3 leukocytosis hematology saw him several times did a bunch of different blood work and came away with conclusion that everything was okay. They recommended a repeat his CBC in 6 months I told him that that certainly something we could follow here and I believe that he will do some lab work before he follows up at his next visit in the spring. At that point we would want to do several tests.

## 2013-07-23 ENCOUNTER — Other Ambulatory Visit: Payer: Self-pay | Admitting: Family Medicine

## 2013-08-03 ENCOUNTER — Telehealth: Payer: Self-pay | Admitting: Family Medicine

## 2013-08-03 ENCOUNTER — Other Ambulatory Visit: Payer: Self-pay | Admitting: *Deleted

## 2013-08-03 DIAGNOSIS — Z79899 Other long term (current) drug therapy: Secondary | ICD-10-CM

## 2013-08-03 DIAGNOSIS — Z125 Encounter for screening for malignant neoplasm of prostate: Secondary | ICD-10-CM

## 2013-08-03 DIAGNOSIS — E785 Hyperlipidemia, unspecified: Secondary | ICD-10-CM

## 2013-08-03 NOTE — Telephone Encounter (Signed)
Patient wants to know if he needs order for bloodwork?

## 2013-08-03 NOTE — Telephone Encounter (Signed)
Lipid, liver, metabolic 7, PSA. Diagnosis screening for prostate cancer, hyperlipidemia, high risk meds

## 2013-08-03 NOTE — Telephone Encounter (Signed)
Patient notified

## 2013-08-05 LAB — LIPID PANEL
CHOLESTEROL: 134 mg/dL (ref 0–200)
HDL: 41 mg/dL (ref >39)
LDL Cholesterol: 74 mg/dL (ref 0–99)
Total CHOL/HDL Ratio: 3.3 Ratio
Triglycerides: 95 mg/dL (ref <150)
VLDL: 19 mg/dL (ref 0–40)

## 2013-08-05 LAB — HEPATIC FUNCTION PANEL
ALT: 16 U/L (ref 0–53)
AST: 18 U/L (ref 0–37)
Albumin: 4.5 g/dL (ref 3.5–5.2)
Alkaline Phosphatase: 37 U/L — ABNORMAL LOW (ref 39–117)
BILIRUBIN INDIRECT: 0.5 mg/dL (ref 0.2–1.2)
BILIRUBIN TOTAL: 0.6 mg/dL (ref 0.2–1.2)
Bilirubin, Direct: 0.1 mg/dL (ref 0.0–0.3)
Total Protein: 6.6 g/dL (ref 6.0–8.3)

## 2013-08-05 LAB — BASIC METABOLIC PANEL
BUN: 16 mg/dL (ref 6–23)
CO2: 30 meq/L (ref 19–32)
Calcium: 9.5 mg/dL (ref 8.4–10.5)
Chloride: 105 mEq/L (ref 96–112)
Creat: 1.09 mg/dL (ref 0.50–1.35)
Glucose, Bld: 96 mg/dL (ref 70–99)
Potassium: 4.9 mEq/L (ref 3.5–5.3)
SODIUM: 141 meq/L (ref 135–145)

## 2013-08-05 LAB — PSA, MEDICARE: PSA: 0.82 ng/mL (ref <=4.00)

## 2013-08-06 ENCOUNTER — Ambulatory Visit (INDEPENDENT_AMBULATORY_CARE_PROVIDER_SITE_OTHER): Payer: Medicare Other | Admitting: Family Medicine

## 2013-08-06 ENCOUNTER — Encounter: Payer: Self-pay | Admitting: Family Medicine

## 2013-08-06 VITALS — BP 134/86 | Ht 68.0 in | Wt 171.0 lb

## 2013-08-06 DIAGNOSIS — J449 Chronic obstructive pulmonary disease, unspecified: Secondary | ICD-10-CM

## 2013-08-06 DIAGNOSIS — D72829 Elevated white blood cell count, unspecified: Secondary | ICD-10-CM

## 2013-08-06 DIAGNOSIS — G609 Hereditary and idiopathic neuropathy, unspecified: Secondary | ICD-10-CM

## 2013-08-06 NOTE — Progress Notes (Signed)
   Subjective:    Patient ID: George Meyer, male    DOB: January 17, 1941, 73 y.o.   MRN: [id number]  HPIMed check up. Follow up on bloodwork done on 08/03/13. He states he's been eating healthy. He is trying to stay physically active. His COPD does limit him. Spot on tongue. He denies any nodule. He states it just burns and feels a little discomforting.  He does take his cholesterol medicine on regular basis tries to watch fats in his diet.  He states COPD he has been stable lately. But it does bother him to some degree when he tries to walk  He does use salt palmetto he states urination is going okay denies rectal bleeding    Review of Systems  Constitutional: Negative for activity change, appetite change and fatigue.  HENT: Negative for congestion and rhinorrhea.   Respiratory: Positive for shortness of breath and wheezing. Negative for cough.   Cardiovascular: Negative for chest pain.  Gastrointestinal: Negative for abdominal pain.  Endocrine: Negative for polydipsia and polyphagia.  Genitourinary: Negative for difficulty urinating.  Neurological: Negative for weakness.  Psychiatric/Behavioral: Negative for confusion.       Objective:   Physical Exam  Constitutional: He appears well-developed and well-nourished.  HENT:  Head: Normocephalic and atraumatic.  Nose: Nose normal.  Mouth/Throat: Oropharynx is clear and moist.  Neck: No thyromegaly present.  Cardiovascular: Normal rate, regular rhythm and normal heart sounds.   No murmur heard. Pulmonary/Chest: Effort normal and breath sounds normal. No respiratory distress. He has no wheezes.  Abdominal: Soft. Bowel sounds are normal. He exhibits no distension and no mass. There is no tenderness.  Musculoskeletal: Normal range of motion. He exhibits no edema.  Lymphadenopathy:    He has no cervical adenopathy.  Neurological: He is alert. He exhibits normal muscle tone.  Skin: Skin is warm and dry. No erythema.  Psychiatric:  He has a normal mood and affect. His behavior is normal. Judgment normal.          Assessment & Plan:  Prostate exam next visit patient defers at this time  Leukocytosis check CBC await the result. Has seen oncology before.  Numbness on the end of the tongue will check B12 deficiency possible  COPD-wheezing noted, otherwise stable if flareup notify us.  Hyperlipidemia stable continue medication.

## 2013-08-10 LAB — CBC WITH DIFFERENTIAL/PLATELET
BASOS ABS: 0.1 10*3/uL (ref 0.0–0.1)
BASOS PCT: 1 % (ref 0–1)
EOS PCT: 5 % (ref 0–5)
Eosinophils Absolute: 0.6 10*3/uL (ref 0.0–0.7)
HEMATOCRIT: 44.9 % (ref 39.0–52.0)
Hemoglobin: 15.5 g/dL (ref 13.0–17.0)
Lymphocytes Relative: 25 % (ref 12–46)
Lymphs Abs: 3.1 10*3/uL (ref 0.7–4.0)
MCH: 30.8 pg (ref 26.0–34.0)
MCHC: 34.5 g/dL (ref 30.0–36.0)
MCV: 89.3 fL (ref 78.0–100.0)
MONO ABS: 1 10*3/uL (ref 0.1–1.0)
Monocytes Relative: 8 % (ref 3–12)
Neutro Abs: 7.6 10*3/uL (ref 1.7–7.7)
Neutrophils Relative %: 61 % (ref 43–77)
PLATELETS: 443 10*3/uL — AB (ref 150–400)
RBC: 5.03 MIL/uL (ref 4.22–5.81)
RDW: 14.7 % (ref 11.5–15.5)
WBC: 12.3 10*3/uL — ABNORMAL HIGH (ref 4.0–10.5)

## 2013-08-11 LAB — VITAMIN B12: Vitamin B-12: 372 pg/mL (ref 211–911)

## 2013-08-24 ENCOUNTER — Other Ambulatory Visit (HOSPITAL_COMMUNITY): Payer: Self-pay

## 2013-08-24 ENCOUNTER — Ambulatory Visit (HOSPITAL_COMMUNITY): Payer: Self-pay

## 2013-09-16 ENCOUNTER — Encounter: Payer: Self-pay | Admitting: Family Medicine

## 2013-09-16 ENCOUNTER — Other Ambulatory Visit: Payer: Self-pay | Admitting: Family Medicine

## 2013-09-16 ENCOUNTER — Ambulatory Visit (INDEPENDENT_AMBULATORY_CARE_PROVIDER_SITE_OTHER): Payer: Medicare Other | Admitting: Family Medicine

## 2013-09-16 VITALS — BP 130/88 | Ht 68.0 in | Wt 173.1 lb

## 2013-09-16 DIAGNOSIS — K1321 Leukoplakia of oral mucosa, including tongue: Secondary | ICD-10-CM

## 2013-09-16 NOTE — Progress Notes (Signed)
   Subjective:    Patient ID: George Meyer, male    DOB: 1941-04-08, 73 y.o.   MRN: [id number]  Mouth Lesions  The current episode started more than 2 weeks ago. The onset was gradual. The problem occurs continuously. The problem has been unchanged. The problem is moderate. Nothing relieves the symptoms. Nothing aggravates the symptoms. Associated symptoms include mouth sores. He has been behaving normally. He has been eating and drinking normally. Recently, medical care has been given by the PCP.  Patient has no other concerns at this time.  PMH benign   Review of Systems  HENT: Positive for mouth sores.    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

## 2013-09-24 ENCOUNTER — Other Ambulatory Visit: Payer: Self-pay | Admitting: Family Medicine

## 2013-10-01 ENCOUNTER — Ambulatory Visit (INDEPENDENT_AMBULATORY_CARE_PROVIDER_SITE_OTHER): Payer: Medicare Other | Admitting: Otolaryngology

## 2013-10-01 DIAGNOSIS — K121 Other forms of stomatitis: Secondary | ICD-10-CM

## 2013-10-01 DIAGNOSIS — K1230 Oral mucositis (ulcerative), unspecified: Secondary | ICD-10-CM

## 2013-10-02 ENCOUNTER — Ambulatory Visit (INDEPENDENT_AMBULATORY_CARE_PROVIDER_SITE_OTHER): Payer: Medicare Other | Admitting: Family Medicine

## 2013-10-02 ENCOUNTER — Telehealth: Payer: Self-pay | Admitting: Family Medicine

## 2013-10-02 ENCOUNTER — Encounter: Payer: Self-pay | Admitting: Family Medicine

## 2013-10-02 VITALS — BP 130/90 | Temp 98.4°F | Ht 68.0 in | Wt 175.0 lb

## 2013-10-02 DIAGNOSIS — J441 Chronic obstructive pulmonary disease with (acute) exacerbation: Secondary | ICD-10-CM

## 2013-10-02 MED ORDER — LEVOFLOXACIN 500 MG PO TABS: 500.0000 mg | ORAL_TABLET | Freq: Every day | ORAL | Status: AC

## 2013-10-02 MED ORDER — PREDNISONE 20 MG PO TABS: ORAL_TABLET | ORAL | Status: DC

## 2013-10-02 NOTE — Progress Notes (Signed)
   Subjective:    Patient ID: George Meyer, male    DOB: 11-14-1940, 73 y.o.   MRN: [id number]  Sinusitis This is a new problem. The current episode started yesterday. The problem is unchanged. There has been no fever. The pain is moderate. Associated symptoms include congestion and coughing. (Myalgias) Treatments tried: ibuprofen. The treatment provided no relief.  Patient has no other concerns at this time.   Started wed turn for the worst  Quite a bit of headache  Cough dry aggravating  Chest irritated  Chilled a bit  Little cong or stuffiness  No current smoking  Review of Systems  HENT: Positive for congestion.   Respiratory: Positive for cough.        Objective:   Physical Exam Alert hydration good. Frontal mass or tenderness evident. Pharynx erythematous neck supple. Lungs bronchial cough wheezing is noted heart regular in rhythm.       Assessment & Plan:  Impression sinusitis bronchitis with exacerbation of COPD. Plan BUN dose prednisone taper. Antibiotics prescribed. Albuterol when necessary. Warning signs discussed. WSL

## 2013-10-02 NOTE — Telephone Encounter (Signed)
Error

## 2013-10-08 ENCOUNTER — Ambulatory Visit (INDEPENDENT_AMBULATORY_CARE_PROVIDER_SITE_OTHER): Payer: Medicare Other | Admitting: Otolaryngology

## 2013-10-08 DIAGNOSIS — K141 Geographic tongue: Secondary | ICD-10-CM

## 2013-11-02 ENCOUNTER — Other Ambulatory Visit: Payer: Self-pay | Admitting: Family Medicine

## 2013-12-08 ENCOUNTER — Telehealth: Payer: Self-pay | Admitting: Family Medicine

## 2013-12-08 DIAGNOSIS — E785 Hyperlipidemia, unspecified: Secondary | ICD-10-CM

## 2013-12-08 DIAGNOSIS — Z0189 Encounter for other specified special examinations: Secondary | ICD-10-CM

## 2013-12-08 DIAGNOSIS — Z79899 Other long term (current) drug therapy: Secondary | ICD-10-CM

## 2013-12-08 NOTE — Telephone Encounter (Signed)
Pt needs bw orders for appt on 6/18 call when done

## 2013-12-08 NOTE — Telephone Encounter (Signed)
08/10/13 had: CBC and Vit B12  08/03/13: PSA, Lip, Liv, Met7

## 2013-12-09 NOTE — Telephone Encounter (Signed)
bloodwork orders ready. Pt notified.  

## 2013-12-09 NOTE — Telephone Encounter (Signed)
Lipid, liver, glucose 

## 2013-12-14 LAB — LIPID PANEL
CHOLESTEROL: 134 mg/dL (ref 0–200)
HDL: 35 mg/dL — AB (ref >39)
LDL Cholesterol: 75 mg/dL (ref 0–99)
Total CHOL/HDL Ratio: 3.8 Ratio
Triglycerides: 119 mg/dL (ref <150)
VLDL: 24 mg/dL (ref 0–40)

## 2013-12-14 LAB — HEPATIC FUNCTION PANEL
ALBUMIN: 4.3 g/dL (ref 3.5–5.2)
ALK PHOS: 38 U/L — AB (ref 39–117)
ALT: 13 U/L (ref 0–53)
AST: 19 U/L (ref 0–37)
BILIRUBIN INDIRECT: 0.5 mg/dL (ref 0.2–1.2)
Bilirubin, Direct: 0.2 mg/dL (ref 0.0–0.3)
Total Bilirubin: 0.7 mg/dL (ref 0.2–1.2)
Total Protein: 6.8 g/dL (ref 6.0–8.3)

## 2013-12-14 LAB — GLUCOSE, RANDOM: Glucose, Bld: 89 mg/dL (ref 70–99)

## 2013-12-17 ENCOUNTER — Ambulatory Visit (INDEPENDENT_AMBULATORY_CARE_PROVIDER_SITE_OTHER): Payer: Medicare Other | Admitting: Family Medicine

## 2013-12-17 ENCOUNTER — Encounter: Payer: Self-pay | Admitting: Family Medicine

## 2013-12-17 VITALS — BP 130/90 | Ht 68.0 in | Wt 171.5 lb

## 2013-12-17 DIAGNOSIS — J449 Chronic obstructive pulmonary disease, unspecified: Secondary | ICD-10-CM

## 2013-12-17 DIAGNOSIS — Z23 Encounter for immunization: Secondary | ICD-10-CM

## 2013-12-17 DIAGNOSIS — I10 Essential (primary) hypertension: Secondary | ICD-10-CM

## 2013-12-17 MED ORDER — AMLODIPINE BESYLATE 2.5 MG PO TABS: 2.5000 mg | ORAL_TABLET | Freq: Every day | ORAL | Status: DC

## 2013-12-17 NOTE — Progress Notes (Signed)
   Subjective:    Patient ID: George Meyer, male    DOB: 06/15/1941, 73 y.o.   MRN: [id number]  HPI Patient states he is here today for his COPD follow up visit. Patient is complaint with all medications. Patient states he has no concerns at this time. He is doing very well.   Patient does have a has a history of slight elevation of white blood cells in his stayed stable recently. He also has a history of HTN and lipid problems. He does try to eat healthy. He is unable to do a lot of physical activity because of his COPD  Review of Systems  Constitutional: Negative for activity change, appetite change and fatigue.  HENT: Negative for congestion.   Respiratory: Negative for cough.   Cardiovascular: Negative for chest pain.  Gastrointestinal: Negative for abdominal pain.  Endocrine: Negative for polydipsia and polyphagia.  Neurological: Negative for weakness.  Psychiatric/Behavioral: Negative for confusion.       Objective:   Physical Exam  Vitals reviewed. Constitutional: He appears well-nourished. No distress.  Cardiovascular: Normal rate, regular rhythm and normal heart sounds.   No murmur heard. Pulmonary/Chest: Effort normal and breath sounds normal. No respiratory distress.  Musculoskeletal: He exhibits no edema.  Lymphadenopathy:    He has no cervical adenopathy.  Neurological: He is alert.  Psychiatric: His behavior is normal.          Assessment & Plan:  1-COPD stable continue current measures Leukocytosis recheck CBC later this fall Hyperlipidemia labs look good no need to recheck any time soon Lipid profile will not be needed in the fall Followup in 4-6 months check CBC at that time

## 2013-12-17 NOTE — Patient Instructions (Signed)
DASH Eating Plan DASH stands for "Dietary Approaches to Stop Hypertension." The DASH eating plan is a healthy eating plan that has been shown to reduce high blood pressure (hypertension). Additional health benefits may include reducing the risk of type 2 diabetes mellitus, heart disease, and stroke. The DASH eating plan may also help with weight loss. WHAT DO I NEED TO KNOW ABOUT THE DASH EATING PLAN? For the DASH eating plan, you will follow these general guidelines:  Choose foods with a percent daily value for sodium of less than 5% (as listed on the food label).  Use salt-free seasonings or herbs instead of table salt or sea salt.  Check with your health care provider or pharmacist before using salt substitutes.  Eat lower-sodium products, often labeled as "lower sodium" or "no salt added."  Eat fresh foods.  Eat more vegetables, fruits, and low-fat dairy products.  Choose whole grains. Look for the word "whole" as the first word in the ingredient list.  Choose fish and skinless chicken or turkey more often than red meat. Limit fish, poultry, and meat to 6 oz (170 g) each day.  Limit sweets, desserts, sugars, and sugary drinks.  Choose heart-healthy fats.  Limit cheese to 1 oz (28 g) per day.  Eat more home-cooked food and less restaurant, buffet, and fast food.  Limit fried foods.  [place] Peden foods using methods other than frying.  Limit canned vegetables. If you do use them, rinse them well to decrease the sodium.  When eating at a restaurant, ask that your food be prepared with less salt, or no salt if possible. WHAT FOODS CAN I EAT? Seek help from a dietitian for individual calorie needs. Grains Whole grain or whole wheat bread. Brown rice. Whole grain or whole wheat pasta. Quinoa, bulgur, and whole grain cereals. Low-sodium cereals. Corn or whole wheat flour tortillas. Whole grain cornbread. Whole grain crackers. Low-sodium crackers. Vegetables Fresh or frozen vegetables  (raw, steamed, roasted, or grilled). Low-sodium or reduced-sodium tomato and vegetable juices. Low-sodium or reduced-sodium tomato sauce and paste. Low-sodium or reduced-sodium canned vegetables.  Fruits All fresh, canned (in natural juice), or frozen fruits. Meat and Other Protein Products Ground beef (85% or leaner), grass-fed beef, or beef trimmed of fat. Skinless chicken or turkey. Ground chicken or turkey. Pork trimmed of fat. All fish and seafood. Eggs. Dried beans, peas, or lentils. Unsalted nuts and seeds. Unsalted canned beans. Dairy Low-fat dairy products, such as skim or 1% milk, 2% or reduced-fat cheeses, low-fat ricotta or cottage cheese, or plain low-fat yogurt. Low-sodium or reduced-sodium cheeses. Fats and Oils Tub margarines without trans fats. Light or reduced-fat mayonnaise and salad dressings (reduced sodium). Avocado. Safflower, olive, or canola oils. Natural peanut or almond butter. Other Unsalted popcorn and pretzels. The items listed above may not be a complete list of recommended foods or beverages. Contact your dietitian for more options. WHAT FOODS ARE NOT RECOMMENDED? Grains White bread. White pasta. White rice. Refined cornbread. Bagels and croissants. Crackers that contain trans fat. Vegetables Creamed or fried vegetables. Vegetables in a cheese sauce. Regular canned vegetables. Regular canned tomato sauce and paste. Regular tomato and vegetable juices. Fruits Dried fruits. Canned fruit in light or heavy syrup. Fruit juice. Meat and Other Protein Products Fatty cuts of meat. Ribs, chicken wings, bacon, sausage, bologna, salami, chitterlings, fatback, hot dogs, bratwurst, and packaged luncheon meats. Salted nuts and seeds. Canned beans with salt. Dairy Whole or 2% milk, cream, half-and-half, and cream cheese. Whole-fat or sweetened yogurt. Full-fat   cheeses or blue cheese. Nondairy creamers and whipped toppings. Processed cheese, cheese spreads, or cheese  curds. Condiments Onion and garlic salt, seasoned salt, table salt, and sea salt. Canned and packaged gravies. Worcestershire sauce. Tartar sauce. Barbecue sauce. Teriyaki sauce. Soy sauce, including reduced sodium. Steak sauce. Fish sauce. Oyster sauce. Cocktail sauce. Horseradish. Ketchup and mustard. Meat flavorings and tenderizers. Bouillon cubes. Hot sauce. Tabasco sauce. Marinades. Taco seasonings. Relishes. Fats and Oils Butter, stick margarine, lard, shortening, ghee, and bacon fat. Coconut, palm kernel, or palm oils. Regular salad dressings. Other Pickles and olives. Salted popcorn and pretzels. The items listed above may not be a complete list of foods and beverages to avoid. Contact your dietitian for more information. WHERE CAN I FIND MORE INFORMATION? National Heart, Lung, and Blood Institute: travelstabloid.com Document Released: 06/07/2011 Document Revised: 06/23/2013 Document Reviewed: 04/22/2013 [facility] Patient Information 2015 [place], [place]. This information is not intended to replace advice given to you by your health care provider. Make sure you discuss any questions you have with your health care provider. Hypertension Hypertension, commonly called high blood pressure, is when the force of blood pumping through your arteries is too strong. Your arteries are the blood vessels that carry blood from your heart throughout your body. A blood pressure reading consists of a higher number over a lower number, such as 110/72. The higher number (systolic) is the pressure inside your arteries when your heart pumps. The lower number (diastolic) is the pressure inside your arteries when your heart relaxes. Ideally you want your blood pressure below 120/80. Hypertension forces your heart to work harder to pump blood. Your arteries may become narrow or stiff. Having hypertension puts you at risk for heart disease, stroke, and other problems.  RISK  FACTORS Some risk factors for high blood pressure are controllable. Others are not.  Risk factors you cannot control include:   Race. You may be at higher risk if you are African American.  Age. Risk increases with age.  Gender. Men are at higher risk than women before age 46 years. After age 78, women are at higher risk than men. Risk factors you can control include:  Not getting enough exercise or physical activity.  Being overweight.  Getting too much fat, sugar, calories, or salt in your diet.  Drinking too much alcohol. SIGNS AND SYMPTOMS Hypertension does not usually cause signs or symptoms. Extremely high blood pressure (hypertensive crisis) may cause headache, anxiety, shortness of breath, and nosebleed. DIAGNOSIS  To check if you have hypertension, your health care provider will measure your blood pressure while you are seated, with your arm held at the level of your heart. It should be measured at least twice using the same arm. Certain conditions can cause a difference in blood pressure between your right and left arms. A blood pressure reading that is higher than normal on one occasion does not mean that you need treatment. If one blood pressure reading is high, ask your health care provider about having it checked again. TREATMENT  Treating high blood pressure includes making lifestyle changes and possibly taking medication. Living a healthy lifestyle can help lower high blood pressure. You may need to change some of your habits. Lifestyle changes may include:  Following the DASH diet. This diet is high in fruits, vegetables, and whole grains. It is low in salt, red meat, and added sugars.  Getting at least 2 1/2 hours of brisk physical activity every week.  Losing weight if necessary.  Not smoking.  Limiting alcoholic beverages.  Learning ways to reduce stress. If lifestyle changes are not enough to get your blood pressure under control, your health care provider  may prescribe medicine. You may need to take more than one. Work closely with your health care provider to understand the risks and benefits. HOME CARE INSTRUCTIONS  Have your blood pressure rechecked as directed by your health care provider.   Only take medicine as directed by your health care provider. Follow the directions carefully. Blood pressure medicines must be taken as prescribed. The medicine does not work as well when you skip doses. Skipping doses also puts you at risk for problems.   Do not smoke.   Monitor your blood pressure at home as directed by your health care provider. SEEK MEDICAL CARE IF:   You think you are having a reaction to medicines taken.  You have recurrent headaches or feel dizzy.  You have swelling in your ankles.  You have trouble with your vision. SEEK IMMEDIATE MEDICAL CARE IF:  You develop a severe headache or confusion.  You have unusual weakness, numbness, or feel faint.  You have severe chest or abdominal pain.  You vomit repeatedly.  You have trouble breathing. MAKE SURE YOU:   Understand these instructions.  Will watch your condition.  Will get help right away if you are not doing well or get worse. Document Released: 06/18/2005 Document Revised: 06/23/2013 Document Reviewed: 04/10/2013 [facility] Patient Information 2015 [place], [place]. This information is not intended to replace advice given to you by your health care provider. Make sure you discuss any questions you have with your health care provider.

## 2013-12-21 ENCOUNTER — Telehealth: Payer: Self-pay | Admitting: Family Medicine

## 2013-12-21 MED ORDER — LISINOPRIL 2.5 MG PO TABS: 2.5000 mg | ORAL_TABLET | Freq: Every day | ORAL | Status: DC

## 2013-12-21 NOTE — Telephone Encounter (Signed)
#  1 May stop amlodipine #2 please put amlodipine on allergy list #3 lisinopril 2.5 mg one daily, #30, 4 refills, he should followup in 4 weeks' time to recheck blood pressure

## 2013-12-21 NOTE — Telephone Encounter (Signed)
Patient was allergic to medication prescribes to him on 6/18 amlodipine vesylate 2.5mg  . It makes him itche all over. What else can he take in place of that medication.

## 2013-12-21 NOTE — Addendum Note (Signed)
Addended byCharolotte Capuchin D on: 12/21/2013 02:35 PM   Modules accepted: Orders

## 2013-12-21 NOTE — Telephone Encounter (Signed)
D/C'd med, added to allergy list, and send in new med. Pt notified, verbalized understanding, and transferred up front for f/u in 4 weeks.

## 2014-01-06 ENCOUNTER — Ambulatory Visit (INDEPENDENT_AMBULATORY_CARE_PROVIDER_SITE_OTHER): Payer: Medicare Other | Admitting: Family Medicine

## 2014-01-06 ENCOUNTER — Encounter: Payer: Self-pay | Admitting: Family Medicine

## 2014-01-06 VITALS — BP 124/80 | Temp 98.4°F | Ht 68.0 in | Wt 173.4 lb

## 2014-01-06 DIAGNOSIS — R590 Localized enlarged lymph nodes: Secondary | ICD-10-CM

## 2014-01-06 DIAGNOSIS — R599 Enlarged lymph nodes, unspecified: Secondary | ICD-10-CM

## 2014-01-06 MED ORDER — BUDESONIDE-FORMOTEROL FUMARATE 160-4.5 MCG/ACT IN AERO: INHALATION_SPRAY | RESPIRATORY_TRACT | Status: DC

## 2014-01-06 NOTE — Progress Notes (Signed)
   Subjective:    Patient ID: George Meyer, male    DOB: [birth year], 73 y.o.   MRN: [id number]  HPI Patient is here today because he found a lump in his throat. Patient first noticed this on Sunday morning. No pain noted. Patient is concerned because he was a former smoker.   Patient states he has no other concerns at this time.    Review of Systems Denies fever chills weight loss sweats nausea vomiting diarrhea    Objective:   Physical Exam He has a small nodule in the left side of his neck the neck is supple the lungs clear to throat normal heart regular pulse normal no other masses are felt       Assessment & Plan:  CT scan first- soft tissue neck, am appointment, this patient has not 1 left-sided neck this is new. It is firm and hard. Approximately 1 cm. Along the left anterior cervical line. I would recommend a scan also recommend ENT evaluation he is a former smoker just recently quit concerning for the possibility of cancer  ENT-may need ENT referral depending on the results of the scan to my gut feeling says it would be best to go ahead and set him up with ENT so that they can visualize his vocal cords he is a former smoker.

## 2014-01-08 LAB — BASIC METABOLIC PANEL
BUN: 14 mg/dL (ref 6–23)
CHLORIDE: 104 meq/L (ref 96–112)
CO2: 29 meq/L (ref 19–32)
CREATININE: 1.09 mg/dL (ref 0.50–1.35)
Calcium: 9.4 mg/dL (ref 8.4–10.5)
GLUCOSE: 92 mg/dL (ref 70–99)
Potassium: 4.7 mEq/L (ref 3.5–5.3)
Sodium: 139 mEq/L (ref 135–145)

## 2014-01-12 ENCOUNTER — Encounter (HOSPITAL_COMMUNITY): Payer: Self-pay

## 2014-01-12 ENCOUNTER — Ambulatory Visit (HOSPITAL_COMMUNITY)
Admission: RE | Admit: 2014-01-12 | Discharge: 2014-01-12 | Disposition: A | Discharge status: Home / Self Care | Payer: Medicare Other | Source: Ambulatory Visit | Attending: Family Medicine | Admitting: Family Medicine

## 2014-01-12 DIAGNOSIS — F17200 Nicotine dependence, unspecified, uncomplicated: Secondary | ICD-10-CM | POA: Insufficient documentation

## 2014-01-12 DIAGNOSIS — R220 Localized swelling, mass and lump, head: Secondary | ICD-10-CM | POA: Insufficient documentation

## 2014-01-12 DIAGNOSIS — R937 Abnormal findings on diagnostic imaging of other parts of musculoskeletal system: Secondary | ICD-10-CM | POA: Insufficient documentation

## 2014-01-12 DIAGNOSIS — I7090 Unspecified atherosclerosis: Secondary | ICD-10-CM | POA: Insufficient documentation

## 2014-01-12 DIAGNOSIS — R221 Localized swelling, mass and lump, neck: Principal | ICD-10-CM

## 2014-01-12 MED ORDER — IOHEXOL 300 MG/ML  SOLN
80.0000 mL | Freq: Once | INTRAMUSCULAR | Status: AC | PRN
  Administered 2014-01-12: 80 mL via INTRAVENOUS

## 2014-01-17 ENCOUNTER — Other Ambulatory Visit: Payer: Self-pay | Admitting: Family Medicine

## 2014-01-21 ENCOUNTER — Encounter: Payer: Self-pay | Admitting: Family Medicine

## 2014-01-21 ENCOUNTER — Ambulatory Visit (INDEPENDENT_AMBULATORY_CARE_PROVIDER_SITE_OTHER): Payer: Medicare Other | Admitting: Family Medicine

## 2014-01-21 VITALS — BP 138/86 | Ht 68.0 in | Wt 173.0 lb

## 2014-01-21 DIAGNOSIS — F411 Generalized anxiety disorder: Secondary | ICD-10-CM

## 2014-01-21 DIAGNOSIS — I10 Essential (primary) hypertension: Secondary | ICD-10-CM

## 2014-01-21 MED ORDER — CITALOPRAM HYDROBROMIDE 10 MG PO TABS: 10.0000 mg | ORAL_TABLET | Freq: Every day | ORAL | Status: DC

## 2014-01-21 MED ORDER — ALPRAZOLAM 0.5 MG PO TABS
ORAL_TABLET | ORAL | Status: DC
Start: 2014-01-21 — End: 2014-02-20

## 2014-01-21 NOTE — Progress Notes (Signed)
   Subjective:    Patient ID: George Meyer, male    DOB: 1940-11-26, 72 y.o.   MRN: [id number]  HPI  Patient arrives to follow up on knot in neck. ENT said everything was good- no concerns. Patient relates feeling anxious multiple different times per week. Denies being depressed no nausea vomiting diarrhea Review of Systems No fever cough wheezing    Objective:   Physical Exam  Lungs clear heart regular neck no masses blood pressure good      Assessment & Plan:  ENT/resolved. Followup if ongoing troubles  Chronic anxiety low dose Celexa Xanax when necessary followup if ongoing troubles warning signs discussed do not use Xanax when driving

## 2014-02-20 ENCOUNTER — Emergency Department (HOSPITAL_COMMUNITY)
Admission: EM | Admit: 2014-02-20 | Discharge: 2014-02-20 | Disposition: A | Discharge status: Home / Self Care | Payer: Medicare Other | Attending: Emergency Medicine | Admitting: Emergency Medicine

## 2014-02-20 ENCOUNTER — Emergency Department (HOSPITAL_COMMUNITY): Payer: Medicare Other

## 2014-02-20 ENCOUNTER — Encounter (HOSPITAL_COMMUNITY): Payer: Self-pay | Admitting: Emergency Medicine

## 2014-02-20 DIAGNOSIS — Z9889 Other specified postprocedural states: Secondary | ICD-10-CM | POA: Diagnosis not present

## 2014-02-20 DIAGNOSIS — F411 Generalized anxiety disorder: Secondary | ICD-10-CM | POA: Diagnosis not present

## 2014-02-20 DIAGNOSIS — Z8639 Personal history of other endocrine, nutritional and metabolic disease: Secondary | ICD-10-CM | POA: Insufficient documentation

## 2014-02-20 DIAGNOSIS — Z792 Long term (current) use of antibiotics: Secondary | ICD-10-CM | POA: Diagnosis not present

## 2014-02-20 DIAGNOSIS — J441 Chronic obstructive pulmonary disease with (acute) exacerbation: Secondary | ICD-10-CM | POA: Diagnosis not present

## 2014-02-20 DIAGNOSIS — IMO0002 Reserved for concepts with insufficient information to code with codable children: Secondary | ICD-10-CM | POA: Insufficient documentation

## 2014-02-20 DIAGNOSIS — H409 Unspecified glaucoma: Secondary | ICD-10-CM | POA: Insufficient documentation

## 2014-02-20 DIAGNOSIS — Z79899 Other long term (current) drug therapy: Secondary | ICD-10-CM | POA: Insufficient documentation

## 2014-02-20 DIAGNOSIS — Z87891 Personal history of nicotine dependence: Secondary | ICD-10-CM | POA: Diagnosis not present

## 2014-02-20 DIAGNOSIS — Z85828 Personal history of other malignant neoplasm of skin: Secondary | ICD-10-CM | POA: Diagnosis not present

## 2014-02-20 DIAGNOSIS — R0602 Shortness of breath: Secondary | ICD-10-CM | POA: Diagnosis present

## 2014-02-20 DIAGNOSIS — Z862 Personal history of diseases of the blood and blood-forming organs and certain disorders involving the immune mechanism: Secondary | ICD-10-CM | POA: Diagnosis not present

## 2014-02-20 MED ORDER — IPRATROPIUM-ALBUTEROL 0.5-2.5 (3) MG/3ML IN SOLN
3.0000 mL | Freq: Once | RESPIRATORY_TRACT | Status: AC
  Administered 2014-02-20: 3 mL via RESPIRATORY_TRACT
  Filled 2014-02-20: qty 3

## 2014-02-20 MED ORDER — IPRATROPIUM BROMIDE 0.02 % IN SOLN
0.5000 mg | Freq: Once | RESPIRATORY_TRACT | Status: DC
  Filled 2014-02-20: qty 2.5

## 2014-02-20 MED ORDER — ALBUTEROL SULFATE (2.5 MG/3ML) 0.083% IN NEBU
5.0000 mg | INHALATION_SOLUTION | Freq: Once | RESPIRATORY_TRACT | Status: DC
  Filled 2014-02-20: qty 6

## 2014-02-20 MED ORDER — ALBUTEROL SULFATE (2.5 MG/3ML) 0.083% IN NEBU
2.5000 mg | INHALATION_SOLUTION | Freq: Once | RESPIRATORY_TRACT | Status: AC
  Administered 2014-02-20: 2.5 mg via RESPIRATORY_TRACT
  Filled 2014-02-20: qty 3

## 2014-02-20 MED ORDER — PREDNISONE 20 MG PO TABS: 60.0000 mg | ORAL_TABLET | Freq: Every day | ORAL | Status: DC

## 2014-02-20 MED ORDER — LEVOFLOXACIN 500 MG PO TABS: 500.0000 mg | ORAL_TABLET | Freq: Every day | ORAL | Status: DC

## 2014-02-20 NOTE — ED Notes (Signed)
Pt reports fatigue, SOB, intermittent fever, and cough productive at times.

## 2014-02-20 NOTE — ED Notes (Signed)
MD at bedside. 

## 2014-02-20 NOTE — Discharge Instructions (Signed)

## 2014-02-20 NOTE — ED Provider Notes (Signed)
CSN: [id number]     Arrival date & time 02/20/14  6734 History  This chart was scribed for [employer]. Alvino Chapel, MD by Erling Conte, ED Scribe. This patient was seen in room APA08/APA08 and the patient's care was started at 9:39 AM.    Chief Complaint  Patient presents with  . Fatigue  . Shortness of Breath     The history is provided by the patient. No language interpreter was used.   HPI Comments: George Meyer is a 73 y.o. male with a h/o COPD, hyperlipidemia, anxiety, basal cell skin cancer, and bilateral glaucoma, who presents to the Emergency Department complaining of intermittent, gradually worsening shortness of breath for 4 days. Pt has an associated sore throat, intermittent subjective fever, and cough productive of yellow sputum. He states he has been taking a Z-pak since Wednesday with no relief.. Did not see a provider, states he had a spare Z-pak that has previously been prescribed. COPD limits activity. Denies any h/o taking oral steroids for his COPD. Denies any sick contacts. Denies any h/o hospitalization for COPD. Denies any nausea, emesis, chest pain, diarrhea or abdominal pain.  Past Medical History  Diagnosis Date  . Anxiety   . Hyperlipidemia   . COPD (chronic obstructive pulmonary disease)   . Pre-diabetes   . Skin cancer, basal cell   . Glaucoma     both eyes   Past Surgical History  Procedure Laterality Date  . Hand surgery Right 2010    dupuytrens excision right little finger  . Cardiac catheterization N/A 12/2005    negative  . Colonoscopy N/A 12/2009    small polyp repeat in 10 years   Family History  Problem Relation Age of Onset  . Heart attack Father   . Heart attack Mother   . Lung cancer Brother    History  Substance Use Topics  . Smoking status: Former Smoker -- 2.00 packs/day for 60 years    Types: Cigarettes    Quit date: 02/29/2012  . Smokeless tobacco: Never Used  . Alcohol Use: No    Review of Systems  Constitutional:  Positive for fever (subjective).  HENT: Positive for sore throat.   Respiratory: Positive for cough (productive of yellow sputum) and shortness of breath.   Cardiovascular: Negative for chest pain.  Gastrointestinal: Negative for nausea, vomiting, abdominal pain and diarrhea.  All other systems reviewed and are negative.     Allergies  Amlodipine  Home Medications   Prior to Admission medications   Medication Sig Start Date End Date Taking? Authorizing Provider  albuterol (PROVENTIL HFA;VENTOLIN HFA) 108 (90 BASE) MCG/ACT inhaler Inhale 2 puffs into the lungs every 4 (four) hours as needed for wheezing or shortness of breath.   Yes Historical Provider, MD  ALPRAZolam Duanne Moron) 0.5 MG tablet Take 0.25-0.5 mg by mouth 3 (three) times daily as needed for anxiety.   Yes Historical Provider, MD  budesonide-formoterol (SYMBICORT) 160-4.5 MCG/ACT inhaler Inhale 2 puffs into the lungs 2 (two) times daily.   Yes Historical Provider, MD  citalopram (CELEXA) 10 MG tablet Take 1 tablet (10 mg total) by mouth daily. 01/21/14 01/21/15 Yes Kathyrn Drown, MD  dorzolamide-timolol (COSOPT) 22.3-6.8 MG/ML ophthalmic solution Place 1 drop into both eyes 2 (two) times daily.  10/15/12  Yes Historical Provider, MD  fenofibrate 160 MG tablet Take 160 mg by mouth daily.   Yes Historical Provider, MD  ibuprofen (ADVIL,MOTRIN) 200 MG tablet Take 400 mg by mouth every 6 (six) hours as  needed for headache.   Yes Historical Provider, MD  lisinopril (PRINIVIL,ZESTRIL) 2.5 MG tablet Take 1 tablet (2.5 mg total) by mouth daily. 12/21/13  Yes Scott A Luking, MD  LUMIGAN 0.01 % SOLN Place 1 drop into both eyes daily.  10/15/12  Yes Historical Provider, MD  SAW PALMETTO, SERENOA REPENS, PO Take 1 capsule by mouth daily.    Yes Historical Provider, MD  levofloxacin (LEVAQUIN) 500 MG tablet Take 1 tablet (500 mg total) by mouth daily. 02/20/14   Jasper Riling. Dellanira Dillow, MD  predniSONE (DELTASONE) 20 MG tablet Take 3 tablets (60 mg  total) by mouth daily. 02/20/14   Jasper Riling. Demiya Magno, MD   Triage Vitals: BP 130/73  Pulse 100  Temp(Src) 98.2 F (36.8 C) (Oral)  Resp 20  Ht 5\' 8"  (1.727 m)  Wt 166 lb (75.297 kg)  BMI 25.25 kg/m2  SpO2 94%  Physical Exam  Nursing note and vitals reviewed. Constitutional: He is oriented to person, place, and time. He appears well-developed and well-nourished. No distress.  Awake, appropriate, NAD  HENT:  Head: Normocephalic and atraumatic.  Mouth/Throat: Mucous membranes are normal. Posterior oropharyngeal erythema present. No oropharyngeal exudate or posterior oropharyngeal edema.  Eyes: Conjunctivae and EOM are normal.  Neck: Neck supple. No tracheal deviation present.  Cardiovascular: Normal rate.   Pulmonary/Chest: Effort normal. No respiratory distress. He has wheezes (slight with inspiration).  Upper airway sounds on lung exam. Slight wheezes, no respiratory distress  Abdominal: Soft. Bowel sounds are normal. There is no tenderness.  Musculoskeletal: Normal range of motion.  Lymphadenopathy:    He has cervical adenopathy (mild).  Neurological: He is alert and oriented to person, place, and time. He has normal reflexes. No cranial nerve deficit. Coordination normal.  Skin: Skin is warm and dry.  Psychiatric: He has a normal mood and affect. His behavior is normal.    ED Course  Procedures (including critical care time)  COORDINATION OF CARE: 9:45 AM- Will order EKG, CXR, albuterol and Atrovent breathing treatment.  Pt advised of plan for treatment and pt agrees.   Labs Review Labs Reviewed - No data to display  Imaging Review Dg Chest 2 View  02/20/2014   CLINICAL DATA:  Fatigue with shortness of breath, congestion and wheezing. Former smoker.  EXAM: CHEST  2 VIEW  COMPARISON:  04/15/2009.  FINDINGS: The heart size and mediastinal contours are stable. There are calcified left hilar lymph nodes. The lungs are hyperinflated with stable scattered scarring. No airspace  disease, edema or developing mass identified. There is no pleural effusion. There are stable degenerative changes within the spine.  IMPRESSION: Stable chronic obstructive lung disease with evidence of prior granulomatous disease. No acute cardiopulmonary process.   Electronically Signed   By: Camie Patience M.D.   On: 02/20/2014 11:11     EKG Interpretation None      MDM   Final diagnoses:  COPD with exacerbation    Patient with shortness of breath. No severe distress. Appears to be COPD exacerbation. With increased sputum production will give antibiotics. Able to ambulate without drop her pulse ox. Will discharge home.  I personally performed the services described in this documentation, which was scribed in my presence. The recorded information has been reviewed and is accurate.      Jasper Riling. Alvino Chapel, MD 02/21/14 [phone]

## 2014-02-20 NOTE — ED Notes (Signed)
SOB, poor appetite since Wed.  Used neb about 1007 with mild relief.

## 2014-02-20 NOTE — ED Notes (Signed)
SATs 94% during and following ambulation.  Pt says he is not SOB.    HR 102.

## 2014-02-22 ENCOUNTER — Other Ambulatory Visit: Payer: Self-pay | Admitting: Family Medicine

## 2014-03-02 ENCOUNTER — Ambulatory Visit (INDEPENDENT_AMBULATORY_CARE_PROVIDER_SITE_OTHER): Payer: Medicare Other | Admitting: Family Medicine

## 2014-03-02 ENCOUNTER — Encounter: Payer: Self-pay | Admitting: Family Medicine

## 2014-03-02 VITALS — BP 130/82 | Ht 68.0 in | Wt 167.0 lb

## 2014-03-02 DIAGNOSIS — J441 Chronic obstructive pulmonary disease with (acute) exacerbation: Secondary | ICD-10-CM

## 2014-03-02 MED ORDER — PREDNISONE 20 MG PO TABS: ORAL_TABLET | ORAL | Status: DC

## 2014-03-02 NOTE — Progress Notes (Signed)
   Subjective:    Patient ID: George Meyer, male    DOB: January 11, 1941, 73 y.o.   MRN: [id number]  HPI Patient is here today for follow up visit after emergency room visit for COPD. Patient states he has no other concerns & that he feels great. He relates he had a severe COPD now prednisone helped he started to do better now  Review of Systems He states no fever currently less productive cough denies sweats chills nausea vomiting.    Objective:   Physical Exam Lungs are clear with a slight wheeze no respiratory distress heart regular pulse normal skin warm dry       Assessment & Plan:  COPD flareup doing better now he has emergency prednisone at home in case he has another flareup he needs to followup this fall continue all medicines as is  15 minutes spent with patient greater than half in discussion and review of all his tests in the ER visit

## 2014-04-02 ENCOUNTER — Ambulatory Visit (INDEPENDENT_AMBULATORY_CARE_PROVIDER_SITE_OTHER): Payer: Medicare Other

## 2014-04-02 DIAGNOSIS — Z23 Encounter for immunization: Secondary | ICD-10-CM

## 2014-04-20 ENCOUNTER — Other Ambulatory Visit: Payer: Self-pay | Admitting: Family Medicine

## 2014-04-30 ENCOUNTER — Other Ambulatory Visit: Payer: Self-pay | Admitting: *Deleted

## 2014-04-30 ENCOUNTER — Telehealth: Payer: Self-pay | Admitting: Family Medicine

## 2014-04-30 MED ORDER — NYSTATIN 100000 UNIT/ML MT SUSP: OROMUCOSAL | Status: DC

## 2014-04-30 NOTE — Telephone Encounter (Signed)
Discussed with patient. Med sent to pharm.  

## 2014-04-30 NOTE — Telephone Encounter (Signed)
Patient has a yeast infection in his mouth from the symbicort inhaler that he was using.  He is requesting something called in for this. Please advise.   [facility] [place]

## 2014-04-30 NOTE — Telephone Encounter (Signed)
Nystatin swish and swallow qid for 7 days. 3 refills. Rinse with water after using steriod inhaler. Per Dr. Nicki Reaper.

## 2014-05-16 ENCOUNTER — Other Ambulatory Visit: Payer: Self-pay | Admitting: Family Medicine

## 2014-05-19 ENCOUNTER — Other Ambulatory Visit: Payer: Self-pay | Admitting: Family Medicine

## 2014-05-22 ENCOUNTER — Other Ambulatory Visit: Payer: Self-pay | Admitting: *Deleted

## 2014-05-22 MED ORDER — FENOFIBRATE 160 MG PO TABS: 160.0000 mg | ORAL_TABLET | Freq: Every day | ORAL | Status: DC

## 2014-05-28 ENCOUNTER — Other Ambulatory Visit: Payer: Self-pay | Admitting: Family Medicine

## 2014-06-05 ENCOUNTER — Other Ambulatory Visit: Payer: Self-pay | Admitting: Family Medicine

## 2014-06-24 ENCOUNTER — Other Ambulatory Visit: Payer: Self-pay | Admitting: Family Medicine

## 2014-07-12 ENCOUNTER — Ambulatory Visit: Payer: Self-pay | Admitting: Family Medicine

## 2014-07-13 ENCOUNTER — Other Ambulatory Visit: Payer: Self-pay | Admitting: Family Medicine

## 2014-09-13 ENCOUNTER — Other Ambulatory Visit: Payer: Self-pay | Admitting: Family Medicine

## 2014-10-18 ENCOUNTER — Other Ambulatory Visit: Payer: Self-pay | Admitting: Family Medicine

## 2014-10-19 ENCOUNTER — Ambulatory Visit (INDEPENDENT_AMBULATORY_CARE_PROVIDER_SITE_OTHER): Payer: Medicare HMO | Admitting: Family Medicine

## 2014-10-19 ENCOUNTER — Encounter: Payer: Self-pay | Admitting: Family Medicine

## 2014-10-19 VITALS — BP 122/88 | Ht 68.0 in | Wt 163.0 lb

## 2014-10-19 DIAGNOSIS — F17200 Nicotine dependence, unspecified, uncomplicated: Secondary | ICD-10-CM

## 2014-10-19 DIAGNOSIS — E785 Hyperlipidemia, unspecified: Secondary | ICD-10-CM | POA: Diagnosis not present

## 2014-10-19 DIAGNOSIS — R634 Abnormal weight loss: Secondary | ICD-10-CM

## 2014-10-19 DIAGNOSIS — Z720 Tobacco use: Secondary | ICD-10-CM

## 2014-10-19 DIAGNOSIS — I10 Essential (primary) hypertension: Secondary | ICD-10-CM

## 2014-10-19 NOTE — Patient Instructions (Signed)
symbicort alternative is:  duoneb ( ipratropium and albuterol ) via a nebulizer 4 times a day  Covered?  Doughnut hole with it?

## 2014-10-19 NOTE — Progress Notes (Addendum)
   Subjective:    Patient ID: George Meyer, male    DOB: [birth year], 74 y.o.   MRN: [id number]  Hypertension This is a chronic problem. The current episode started more than 1 year ago. Pertinent negatives include no chest pain. Compliance problems include diet.   pt does not exercise but he cuts grass. Stays active.  Pt states no concerns today.  Needs all meds refilled and put on file.  25 minutes spent today discussing multiple things including medications alternatives to Symbicort screening for lung cancer new importance of maintaining a healthy diet he has also little bit of weight I believe it's due to healthy eating also discussed high blood pressure dietary measures as well as cholesterol in the importance of taking his medicine  Review of Systems  Constitutional: Negative for activity change, appetite change and fatigue.  HENT: Negative for congestion.   Respiratory: Negative for cough.   Cardiovascular: Negative for chest pain.  Gastrointestinal: Negative for abdominal pain.  Endocrine: Negative for polydipsia and polyphagia.  Neurological: Negative for weakness.  Psychiatric/Behavioral: Negative for confusion.       Objective:   Physical Exam  Constitutional: He appears well-nourished. No distress.  Cardiovascular: Normal rate, regular rhythm and normal heart sounds.   No murmur heard. Pulmonary/Chest: Effort normal and breath sounds normal. No respiratory distress.  Musculoskeletal: He exhibits no edema.  Lymphadenopathy:    He has no cervical adenopathy.  Neurological: He is alert.  Psychiatric: His behavior is normal.  Vitals reviewed.         Assessment & Plan:  Hypertension decent control continue current measures. Watch diet closely  COPD having a hard time affording Symbicort he might end up going to a DuoNeb treatment 4 times a day he will let us know.  Patient with extensive history of smoking to get a CT scan of the chest  Significant fatigue  and tiredness check CBC see thyroid function. Denies sleep apnea symptoms  Hyperlipidemia continue current medication check lipid profile  Hypertension need to check kidney function sodium-potassium.  This patient quit smoking in 2013. He has the equivalent of 120 pack years. He is not having any signs of cancer currently. He is 74 years old. We'll discuss the low dose CAT scans and how this can decrease the risk of dying from lung cancer but we also discussed that there are risks including overdiagnosis, faults positive rates, radiation exposure, and the need for follow-up diagnostic testing. We also discussed that doing nothing could potentially overlook a problem. We also discussed the importance of staying away from smoking. We also discussed the importance of continuing the annual lung cancer screening all the way through age 61. We also discussed that if this does show a cancer he must be willing to treat it or go through further testing. In addition to this we discussed how Medicare does provide coverage for this.

## 2014-10-26 ENCOUNTER — Ambulatory Visit (HOSPITAL_COMMUNITY): Admission: RE | Admit: 2014-10-26 | Payer: Medicare HMO | Source: Ambulatory Visit

## 2014-10-26 ENCOUNTER — Other Ambulatory Visit: Payer: Self-pay | Admitting: Family Medicine

## 2014-10-26 LAB — CBC WITH DIFFERENTIAL/PLATELET
BASOS ABS: 0.1 10*3/uL (ref 0.0–0.2)
Basos: 1 %
EOS (ABSOLUTE): 0.5 10*3/uL — ABNORMAL HIGH (ref 0.0–0.4)
Eos: 4 %
HEMOGLOBIN: 16.8 g/dL (ref 12.6–17.7)
Hematocrit: 48.4 % (ref 37.5–51.0)
IMMATURE GRANS (ABS): 0 10*3/uL (ref 0.0–0.1)
Immature Granulocytes: 0 %
Lymphocytes Absolute: 2.6 10*3/uL (ref 0.7–3.1)
Lymphs: 21 %
MCH: 30.9 pg (ref 26.6–33.0)
MCHC: 34.7 g/dL (ref 31.5–35.7)
MCV: 89 fL (ref 79–97)
MONOCYTES: 9 %
MONOS ABS: 1.1 10*3/uL — AB (ref 0.1–0.9)
NEUTROS PCT: 65 %
Neutrophils Absolute: 8.2 10*3/uL — ABNORMAL HIGH (ref 1.4–7.0)
Platelets: 410 10*3/uL — ABNORMAL HIGH (ref 150–379)
RBC: 5.43 x10E6/uL (ref 4.14–5.80)
RDW: 15.4 % (ref 12.3–15.4)
WBC: 12.6 10*3/uL — AB (ref 3.4–10.8)

## 2014-10-26 LAB — LIPID PANEL
CHOLESTEROL TOTAL: 155 mg/dL (ref 100–199)
Chol/HDL Ratio: 3.6 ratio units (ref 0.0–5.0)
HDL: 43 mg/dL (ref >39)
LDL Calculated: 91 mg/dL (ref 0–99)
Triglycerides: 104 mg/dL (ref 0–149)
VLDL Cholesterol Cal: 21 mg/dL (ref 5–40)

## 2014-10-26 LAB — TSH: TSH: 1.33 u[IU]/mL (ref 0.450–4.500)

## 2014-10-26 LAB — BASIC METABOLIC PANEL
BUN/Creatinine Ratio: 12 (ref 10–22)
BUN: 14 mg/dL (ref 8–27)
CO2: 28 mmol/L (ref 18–29)
Calcium: 9.4 mg/dL (ref 8.6–10.2)
Chloride: 98 mmol/L (ref 97–108)
Creatinine, Ser: 1.19 mg/dL (ref 0.76–1.27)
GFR calc Af Amer: 69 mL/min/{1.73_m2} (ref >59)
GFR, EST NON AFRICAN AMERICAN: 59 mL/min/{1.73_m2} — AB (ref >59)
Glucose: 99 mg/dL (ref 65–99)
Potassium: 4.8 mmol/L (ref 3.5–5.2)
SODIUM: 140 mmol/L (ref 134–144)

## 2014-11-04 ENCOUNTER — Other Ambulatory Visit: Payer: Self-pay

## 2014-11-04 DIAGNOSIS — D72829 Elevated white blood cell count, unspecified: Secondary | ICD-10-CM

## 2014-11-09 ENCOUNTER — Encounter: Payer: Self-pay | Admitting: Family Medicine

## 2014-11-09 ENCOUNTER — Ambulatory Visit (HOSPITAL_COMMUNITY)
Admission: RE | Admit: 2014-11-09 | Discharge: 2014-11-09 | Disposition: A | Discharge status: Home / Self Care | Payer: Medicare HMO | Source: Ambulatory Visit | Attending: Family Medicine | Admitting: Family Medicine

## 2014-11-09 DIAGNOSIS — Z720 Tobacco use: Secondary | ICD-10-CM | POA: Diagnosis not present

## 2014-11-09 DIAGNOSIS — R911 Solitary pulmonary nodule: Secondary | ICD-10-CM | POA: Insufficient documentation

## 2014-11-09 DIAGNOSIS — Z122 Encounter for screening for malignant neoplasm of respiratory organs: Secondary | ICD-10-CM | POA: Diagnosis not present

## 2014-11-20 ENCOUNTER — Other Ambulatory Visit: Payer: Self-pay | Admitting: Family Medicine

## 2014-12-09 ENCOUNTER — Other Ambulatory Visit: Payer: Self-pay | Admitting: Family Medicine

## 2015-01-07 ENCOUNTER — Other Ambulatory Visit: Payer: Self-pay | Admitting: Family Medicine

## 2015-01-07 DIAGNOSIS — D72829 Elevated white blood cell count, unspecified: Secondary | ICD-10-CM

## 2015-01-08 ENCOUNTER — Other Ambulatory Visit: Payer: Self-pay | Admitting: Family Medicine

## 2015-01-08 LAB — CBC WITH DIFFERENTIAL/PLATELET
BASOS: 1 %
Basophils Absolute: 0.1 10*3/uL (ref 0.0–0.2)
EOS (ABSOLUTE): 0.5 10*3/uL — ABNORMAL HIGH (ref 0.0–0.4)
Eos: 4 %
HEMOGLOBIN: 16.1 g/dL (ref 12.6–17.7)
Hematocrit: 48.4 % (ref 37.5–51.0)
IMMATURE GRANS (ABS): 0 10*3/uL (ref 0.0–0.1)
Immature Granulocytes: 0 %
LYMPHS: 26 %
Lymphocytes Absolute: 3.3 10*3/uL — ABNORMAL HIGH (ref 0.7–3.1)
MCH: 30.6 pg (ref 26.6–33.0)
MCHC: 33.3 g/dL (ref 31.5–35.7)
MCV: 92 fL (ref 79–97)
Monocytes Absolute: 1 10*3/uL — ABNORMAL HIGH (ref 0.1–0.9)
Monocytes: 7 %
NEUTROS ABS: 8 10*3/uL — AB (ref 1.4–7.0)
NEUTROS PCT: 62 %
Platelets: 399 10*3/uL — ABNORMAL HIGH (ref 150–379)
RBC: 5.26 x10E6/uL (ref 4.14–5.80)
RDW: 15.1 % (ref 12.3–15.4)
WBC: 13 10*3/uL — AB (ref 3.4–10.8)

## 2015-01-11 NOTE — Addendum Note (Signed)
Addended by: Carmelina Noun on: 01/11/2015 05:23 PM   Modules accepted: Orders

## 2015-01-14 ENCOUNTER — Other Ambulatory Visit: Payer: Self-pay | Admitting: Family Medicine

## 2015-01-26 ENCOUNTER — Ambulatory Visit (HOSPITAL_COMMUNITY): Payer: Medicare HMO | Admitting: Hematology & Oncology

## 2015-01-27 ENCOUNTER — Other Ambulatory Visit (HOSPITAL_COMMUNITY)
Admission: RE | Admit: 2015-01-27 | Discharge: 2015-01-27 | Disposition: A | Discharge status: Home / Self Care | Payer: Medicare HMO | Source: Ambulatory Visit | Attending: Hematology & Oncology | Admitting: Hematology & Oncology

## 2015-01-27 ENCOUNTER — Telehealth: Payer: Self-pay | Admitting: Family Medicine

## 2015-01-27 ENCOUNTER — Encounter (HOSPITAL_COMMUNITY): Payer: Medicare HMO | Attending: Hematology & Oncology | Admitting: Hematology & Oncology

## 2015-01-27 VITALS — BP 165/85 | HR 64 | Temp 98.4°F | Resp 16 | Ht 65.0 in | Wt 163.9 lb

## 2015-01-27 DIAGNOSIS — D72829 Elevated white blood cell count, unspecified: Secondary | ICD-10-CM | POA: Insufficient documentation

## 2015-01-27 DIAGNOSIS — D473 Essential (hemorrhagic) thrombocythemia: Secondary | ICD-10-CM | POA: Diagnosis not present

## 2015-01-27 LAB — COMPREHENSIVE METABOLIC PANEL
ALK PHOS: 39 U/L (ref 38–126)
ALT: 15 U/L — ABNORMAL LOW (ref 17–63)
ANION GAP: 8 (ref 5–15)
AST: 20 U/L (ref 15–41)
Albumin: 4.5 g/dL (ref 3.5–5.0)
BILIRUBIN TOTAL: 0.8 mg/dL (ref 0.3–1.2)
BUN: 12 mg/dL (ref 6–20)
CALCIUM: 9.3 mg/dL (ref 8.9–10.3)
CO2: 29 mmol/L (ref 22–32)
Chloride: 101 mmol/L (ref 101–111)
Creatinine, Ser: 1.05 mg/dL (ref 0.61–1.24)
GFR calc Af Amer: >60 mL/min (ref >60)
GLUCOSE: 110 mg/dL — AB (ref 65–99)
POTASSIUM: 4.2 mmol/L (ref 3.5–5.1)
Sodium: 138 mmol/L (ref 135–145)
TOTAL PROTEIN: 7.3 g/dL (ref 6.5–8.1)

## 2015-01-27 LAB — C-REACTIVE PROTEIN

## 2015-01-27 LAB — CBC WITH DIFFERENTIAL/PLATELET
Basophils Absolute: 0.1 10*3/uL (ref 0.0–0.1)
Basophils Relative: 1 % (ref 0–1)
EOS PCT: 4 % (ref 0–5)
Eosinophils Absolute: 0.4 10*3/uL (ref 0.0–0.7)
HEMATOCRIT: 48.7 % (ref 39.0–52.0)
HEMOGLOBIN: 16.7 g/dL (ref 13.0–17.0)
LYMPHS PCT: 27 % (ref 12–46)
Lymphs Abs: 3.3 10*3/uL (ref 0.7–4.0)
MCH: 31.7 pg (ref 26.0–34.0)
MCHC: 34.3 g/dL (ref 30.0–36.0)
MCV: 92.6 fL (ref 78.0–100.0)
MONO ABS: 1 10*3/uL (ref 0.1–1.0)
MONOS PCT: 8 % (ref 3–12)
NEUTROS ABS: 7.4 10*3/uL (ref 1.7–7.7)
Neutrophils Relative %: 60 % (ref 43–77)
Platelets: 414 10*3/uL — ABNORMAL HIGH (ref 150–400)
RBC: 5.26 MIL/uL (ref 4.22–5.81)
RDW: 14.3 % (ref 11.5–15.5)
WBC: 12.2 10*3/uL — ABNORMAL HIGH (ref 4.0–10.5)

## 2015-01-27 LAB — SEDIMENTATION RATE: SED RATE: 5 mm/h (ref 0–16)

## 2015-01-27 NOTE — Progress Notes (Signed)
George Meyer presented for labwork. Labs per MD order drawn via Peripheral Line 21 gauge needle inserted in left AC  Good blood return present. Procedure without incident.  Needle removed intact. Patient tolerated procedure well.

## 2015-01-27 NOTE — Telephone Encounter (Signed)
Pt would like to have a script for chantix please   [facility]

## 2015-01-27 NOTE — Telephone Encounter (Signed)
Lets do it plus two ref

## 2015-01-27 NOTE — Progress Notes (Signed)
[facility] NOTE  Patient Care Team: Kathyrn Drown, MD as PCP - General (Family Medicine)  CHIEF COMPLAINTS/PURPOSE OF CONSULTATION:  Leukocytosis Thrombocytosis  HISTORY OF PRESENTING ILLNESS:  George Meyer 74 y.o. male is here because of persistent leukocytosis. On review of laboratory studies he is noted to have a mild elevation and multiple white cell lines. He is also noted to have a mild thrombocytosis. He has been referred here today by Dr. Wolfgang Phoenix for additional evaluation.  He is here alone today and goes by "George Meyer".  He has lost 8 lbs in the last year due to loss in appetite. This has not concerned him, as he has talked with friends of similar age that have the same issue. He denies night sweats, no significant change in his PS or energy level. Previously diagnosed with COPD, with occasional wheezing. He plans to quit smoking soon, having Dr. Wolfgang Phoenix prescribe him Chantix. This year he started having a problem with seasonal allergies. He has never had issues with them previously. He is up to date on his colonoscopies.  MEDICAL HISTORY:  Past Medical History  Diagnosis Date  . Anxiety   . Hyperlipidemia   . COPD (chronic obstructive pulmonary disease)   . Pre-diabetes   . Skin cancer, basal cell   . Glaucoma     both eyes    SURGICAL HISTORY: Past Surgical History  Procedure Laterality Date  . Hand surgery Right 2010    dupuytrens excision right little finger  . Cardiac catheterization N/A 12/2005    negative  . Colonoscopy N/A 12/2009    small polyp repeat in 10 years   SOCIAL HISTORY: Social History   Social History  . Marital Status: Single    Spouse Name: N/A  . Number of Children: N/A  . Years of Education: N/A   Occupational History  . Not on file.   Social History Main Topics  . Smoking status: Former Smoker -- 2.00 packs/day for 60 years    Types: Cigarettes    Quit date: 02/29/2012  . Smokeless  tobacco: Never Used  . Alcohol Use: No  . Drug Use: No  . Sexual Activity: Not on file   Other Topics Concern  . Not on file   Social History Narrative  Single. 2 children. 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 Smoker, started at 74 yo. He plans on quitting, having Dr. Wolfgang Phoenix prescribe him Chantix. ETOH, he drank in his early years, he hasn't drank in 12 years. No chewing tobacco use. 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  FAMILY HISTORY: Family History  Problem Relation Age of Onset  . Heart attack Father   . Heart attack Mother   . Lung cancer Brother    indicated that his mother is deceased. He indicated that his father is deceased. He indicated that his brother is deceased.  Mother deceased at 11 yo from heart attack. Father deceased at 60 yo from heart attack. 5 siblings deceased. He is the youngest. 1 died of lung cancer, having never smoked. 1 died of polio. 1 died "drinking himself to death" 1 did of unknown causes  ALLERGIES:  is allergic to amlodipine.  MEDICATIONS:  Current Outpatient Prescriptions  Medication Sig Dispense Refill  . dorzolamide-timolol (COSOPT) 22.3-6.8 MG/ML ophthalmic solution Place 1 drop into both eyes 2 (two) times daily.     Marland Kitchen ibuprofen (ADVIL,MOTRIN) 200  MG tablet Take 400 mg by mouth every 6 (six) hours as needed for headache.    . latanoprost (XALATAN) 0.005 % ophthalmic solution Place 1 drop into both eyes at bedtime.     Marland Kitchen lisinopril (PRINIVIL,ZESTRIL) 2.5 MG tablet TAKE ONE TABLET BY MOUTH ONCE DAILY 30 tablet 5  . pravastatin (PRAVACHOL) 20 MG tablet TAKE ONE TABLET BY MOUTH ONCE DAILY **PATIENT  NEEDS  OFFICE  VISIT** 30 tablet 3  . SAW PALMETTO, SERENOA REPENS, PO Take 1 capsule by mouth daily.     . SYMBICORT 160-4.5 MCG/ACT inhaler INHALE TWO PUFFS BY MOUTH TWICE DAILY ** STOP SPIRIVA ** 10.2 g 5  . VENTOLIN HFA 108 (90 BASE) MCG/ACT inhaler  INHALE TWO PUFFS BY MOUTH EVERY 4 HOURS AS NEEDED 18 each 4  . citalopram (CELEXA) 10 MG tablet TAKE ONE TABLET BY MOUTH ONCE DAILY 30 tablet 5  . fenofibrate 160 MG tablet TAKE ONE TABLET BY MOUTH ONCE DAILY 30 tablet 2  . varenicline (CHANTIX CONTINUING MONTH PAK) 1 MG tablet Take 1 tablet (1 mg total) by mouth 2 (two) times daily. 60 tablet 1  . varenicline (CHANTIX STARTING MONTH PAK) 0.5 MG X 11 & 1 MG X 42 tablet Take one 0.5 mg tablet by mouth once daily for 3 days, then increase to one 0.5 mg tablet twice daily for 4 days, then increase to one 1 mg tablet twice daily. 53 tablet 0   No current facility-administered medications for this visit.    Review of Systems  Constitutional: Positive for weight loss. Negative for malaise/fatigue.       Lost 8 lbs in the last year due to loss in appetite.  Respiratory: Positive for shortness of breath and wheezing.        COPD Seasonal allergies, which began this year.  Gastrointestinal: Negative for nausea, vomiting and abdominal pain.  All other systems reviewed and are negative.  14 point ROS was done and is otherwise as detailed above or in HPI   PHYSICAL EXAMINATION: ECOG PERFORMANCE STATUS: 0 - Asymptomatic  Filed Vitals:   01/27/15 1430  BP: 165/85  Pulse: 64  Temp: 98.4 F (36.9 C)  Resp: 16   Filed Weights   01/27/15 1430  Weight: 163 lb 14.4 oz (74.345 kg)     Physical Exam  Constitutional: He is oriented to person, place, and time and well-developed, well-nourished, and in no distress.  HENT:  Head: Normocephalic and atraumatic.  Nose: Nose normal.  Mouth/Throat: Oropharynx is clear and moist. No oropharyngeal exudate.  Eyes: Conjunctivae and EOM are normal. Pupils are equal, round, and reactive to light. Right eye exhibits no discharge. Left eye exhibits no discharge. No scleral icterus.  Neck: Normal range of motion. Neck supple. No tracheal deviation present. No thyromegaly present.  Cardiovascular: Normal rate,  regular rhythm and normal heart sounds.  Exam reveals no gallop and no friction rub.   No murmur heard. Pulmonary/Chest: Effort normal and breath sounds normal. He has no wheezes. He has no rales.  Abdominal: Soft. Bowel sounds are normal. He exhibits no distension and no mass. There is no tenderness. There is no rebound and no guarding.  Musculoskeletal: Normal range of motion. He exhibits no edema.  Lymphadenopathy:    He has no cervical adenopathy.  Neurological: He is alert and oriented to person, place, and time. He has normal reflexes. No cranial nerve deficit. Gait normal. Coordination normal.  Skin: Skin is warm and dry. No rash noted.  Psychiatric:  Mood, memory, affect and judgment normal.  Nursing note and vitals reviewed.    LABORATORY DATA:  I have reviewed the data as listed  Results for George Meyer, George Meyer (MRN [id number]) as of 02/25/2015 11:02  Ref. Range 10/25/2014 08:16 11/09/2014 09:35 01/07/2015 08:11  WBC Latest Ref Range: 3.4-10.8 x10E3/uL 12.6 (H)  13.0 (H)  RBC Latest Ref Range: 4.14-5.80 x10E6/uL 5.43  5.26  Hemoglobin Latest Ref Range: 12.6-17.7 g/dL 16.8  16.1  HCT Latest Ref Range: 37.5-51.0 % 48.4  48.4  MCV Latest Ref Range: 79-97 fL 89  92  MCH Latest Ref Range: 26.6-33.0 pg 30.9  30.6  MCHC Latest Ref Range: 31.5-35.7 g/dL 34.7  33.3  RDW Latest Ref Range: 12.3-15.4 % 15.4  15.1  Platelets Latest Ref Range: 150-379 x10E3/uL 410 (H)  399 (H)  NEUT# Latest Ref Range: 1.4-7.0 x10E3/uL 8.2 (H)  8.0 (H)  Neutrophils Latest Units: % 65  62  Lymphocyte # Latest Ref Range: 0.7-3.1 x10E3/uL 2.6  3.3 (H)  Monocytes Absolute Latest Ref Range: 0.1-0.9 x10E3/uL 1.1 (H)  1.0 (H)  Basophils Absolute Latest Ref Range: 0.0-0.2 x10E3/uL 0.1  0.1  Immature Granulocytes Latest Units: % 0  0  Immature Grans (Abs) Latest Ref Range: 0.0-0.1 x10E3/uL 0.0  0.0  Lymphs Latest Units: % 21  26  Monocytes Latest Units: % 9  7  Basos Latest Units: % 1  1  Eos Latest Units: % 4  4    EOS (ABSOLUTE) Latest Ref Range: 0.0-0.4 x10E3/uL 0.5 (H)  0.5 (H)  Hematology Comments: Unknown   Note:  Glucose Latest Ref Range: 65-99 mg/dL 99    TSH Latest Ref Range: 0.450-4.500 uIU/mL 1.330      ASSESSMENT & PLAN:  Leukocytosis, nonspecific Thrombocytosis  I discussed with the patient potential causes of his leukocytosis and thrombocytosis. He does not have specific elevation of any one white blood cell line but has overall "diffuse" leukocytosis. I recommended starting with peripheral evaluation today including flow cytometry and a peripheral smear review. I currently do not feel we need to move towards a bone marrow biopsy. I did discuss this procedure with the patient and advised him that at some point we may need to consider. I will have him follow up with [place] within 2 weeks to review the results of today's blood work. We will make additional recommendations regarding follow-up and/or additional evaluation at that time.  Orders Placed This Encounter  Procedures  . JAK2 genotypr  . CBC with Differential  . Comprehensive metabolic panel  . Sedimentation rate  . C-reactive protein    All questions were answered. The patient knows to call the clinic with any problems, questions or concerns.  This note was electronically signed.    This document serves as a record of services personally performed by Ancil Linsey, MD. It was created on her behalf by Arlyce Harman, a trained medical scribe. The creation of this record is based on the scribe's personal observations and the provider's statements to them. This document has been checked and approved by the attending provider.  I have reviewed the above documentation for accuracy and completeness, and I agree with the above.  Molli Hazard, MD  02/25/2015 11:02 AM

## 2015-01-27 NOTE — Patient Instructions (Signed)
[place] at [facility] Discharge Instructions  RECOMMENDATIONS MADE BY THE CONSULTANT AND ANY TEST RESULTS WILL BE SENT TO YOUR REFERRING PHYSICIAN.  Exam and discussion by Dr. Whitney Muse. Will check some additional labs today to complete your work-up and see you back in 2 weeks to discuss test results.  Thank you for choosing [place] at [facility] to provide your oncology and hematology care.  To afford each patient quality time with our provider, please arrive at least 15 minutes before your scheduled appointment time.    You need to re-schedule your appointment should you arrive 10 or more minutes late.  We strive to give you quality time with our providers, and arriving late affects you and other patients whose appointments are after yours.  Also, if you no show three or more times for appointments you may be dismissed from the clinic at the providers discretion.     Again, thank you for choosing [facility].  Our hope is that these requests will decrease the amount of time that you wait before being seen by our physicians.       _____________________________________________________________  Should you have questions after your visit to [facility], please contact our office at [phone] between the hours of 8:30 a.m. and 4:30 p.m.  Voicemails left after 4:30 p.m. will not be returned until the following business day.  For prescription refill requests, have your pharmacy contact our office.

## 2015-01-28 ENCOUNTER — Other Ambulatory Visit: Payer: Self-pay | Admitting: Family Medicine

## 2015-01-28 MED ORDER — VARENICLINE TARTRATE 1 MG PO TABS: 1.0000 mg | ORAL_TABLET | Freq: Two times a day (BID) | ORAL | Status: DC

## 2015-01-28 MED ORDER — VARENICLINE TARTRATE 0.5 MG X 11 & 1 MG X 42 PO MISC: ORAL | Status: DC

## 2015-01-28 NOTE — Telephone Encounter (Signed)
Scripts faxed to pharmacy. Left message on voicemail notifying patient.

## 2015-02-01 LAB — JAK2 GENOTYPR

## 2015-02-03 ENCOUNTER — Other Ambulatory Visit: Payer: Self-pay | Admitting: Family Medicine

## 2015-02-03 NOTE — Telephone Encounter (Signed)
Ok plus five ref scotts name

## 2015-02-11 ENCOUNTER — Encounter (HOSPITAL_COMMUNITY): Payer: Medicare HMO | Attending: Hematology & Oncology | Admitting: Hematology & Oncology

## 2015-02-11 VITALS — BP 166/81 | HR 81 | Temp 98.2°F | Resp 18 | Wt 164.0 lb

## 2015-02-11 DIAGNOSIS — D72829 Elevated white blood cell count, unspecified: Secondary | ICD-10-CM | POA: Insufficient documentation

## 2015-02-11 NOTE — Progress Notes (Signed)
Sallee Lange, MD  [place] [id number]   DIAGNOSIS:  Leukocytosis Thrombocytosis JAK2 V617F mutation negative JAK 2 exon 12 and 13 negative Negative Flow Cytometry Normal ESR, CRP  CURRENT THERAPY: Observation  INTERVAL HISTORY: George Meyer 74 y.o. male returns for follow-up of leukocytosis and thrombocytosis. As no major complaints today he is here for laboratory studies review.  He is feeling great.  He has started taking Chantix and says that it is already taking a positive effect on him.  He does not sleep well at night, this has been consistent for about 2 years.  MEDICAL HISTORY: Past Medical History  Diagnosis Date  . Anxiety   . Hyperlipidemia   . COPD (chronic obstructive pulmonary disease)   . Pre-diabetes   . Skin cancer, basal cell   . Glaucoma     both eyes    has HYPERTRIGLYCERIDEMIA; ANXIETY; COPD; IBS; ABDOMINAL PAIN, LEFT LOWER QUADRANT; BLOOD IN STOOL, OCCULT; Other malaise and fatigue; Leukocytosis, unspecified; HTN (hypertension); and Solitary pulmonary nodule on his problem list.     is allergic to amlodipine.  $RemoveBefo'@[email or web address]'[id number]$ @  SURGICAL HISTORY: Past Surgical History  Procedure Laterality Date  . Hand surgery Right 2010    dupuytrens excision right little finger  . Cardiac catheterization N/A 12/2005    negative  . Colonoscopy N/A 12/2009    small polyp repeat in 10 years    SOCIAL HISTORY: Social History   Social History  . Marital Status: Single    Spouse Name: N/A  . Number of Children: N/A  . Years of Education: N/A   Occupational History  . Not on file.   Social History Main Topics  . Smoking status: Former Smoker -- 2.00 packs/day for 60 years    Types: Cigarettes    Quit date: 02/29/2012  . Smokeless tobacco: Never Used  . Alcohol Use: No  . Drug Use: No  . Sexual Activity: Not on file   Other Topics Concern  . Not on file   Social History Narrative  Single. 2 children. 4  grandchildren Was a Horticulturist, commercial for a Copywriter, advertising, now retired. Smoker, started at 74 yo. He plans on quitting, having Dr. Wolfgang Phoenix prescribe him Chantix. ETOH, he drank in his early years, he hasn't drank in 12 years. No chewing tobacco use. 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  FAMILY HISTORY: Family History  Problem Relation Age of Onset  . Heart attack Father   . Heart attack Mother   . Lung cancer Brother   indicated that his mother is deceased. He indicated that his father is deceased. He indicated that his brother is deceased.  Mother deceased at 80 yo from heart attack. Father deceased at 60 yo from heart attack. 5 siblings deceased. He is the youngest. 1 died of lung cancer, having never smoked. 1 died of polio. 1 died "drinking himself to death" 1 did of unknown causes  Review of Systems  Constitutional: Positive for weight loss. Negative for fever, chills and malaise/fatigue.  HENT: Negative for congestion, hearing loss, nosebleeds, sore throat and tinnitus.   Eyes: Negative for blurred vision, double vision, pain and discharge.  Respiratory: Negative for cough, hemoptysis, sputum production, shortness of breath and wheezing.   Cardiovascular: Negative for chest pain, palpitations, claudication, leg swelling and PND.  Gastrointestinal: Negative for heartburn, nausea, vomiting, abdominal pain, diarrhea, constipation, blood in stool and melena.  Genitourinary: Negative for  dysuria, urgency, frequency and hematuria.  Musculoskeletal: Negative for myalgias, joint pain and falls.  Skin: Negative for itching and rash.  Neurological: Negative for dizziness, tingling, tremors, sensory change, speech change, focal weakness, seizures, loss of consciousness, weakness and headaches.  Endo/Heme/Allergies: Does not bruise/bleed easily.  Psychiatric/Behavioral: Negative for depression, suicidal ideas, memory loss and substance  abuse. The patient is not nervous/anxious and does not have insomnia.   14 point review of systems was performed and is negative except as detailed under history of present illness and above   PHYSICAL EXAMINATION  ECOG PERFORMANCE STATUS: 1 - Symptomatic but completely ambulatory  Filed Vitals:   02/11/15 1012  BP: 166/81  Pulse: 81  Temp: 98.2 F (36.8 C)  Resp: 18    Physical Exam  Constitutional: He is oriented to person, place, and time and well-developed, well-nourished, and in no distress.  HENT:  Head: Normocephalic and atraumatic.  Nose: Nose normal.  Mouth/Throat: Oropharynx is clear and moist. No oropharyngeal exudate.  Eyes: Conjunctivae and EOM are normal. Pupils are equal, round, and reactive to light. Right eye exhibits no discharge. Left eye exhibits no discharge. No scleral icterus.  Neck: Normal range of motion. Neck supple. No tracheal deviation present. No thyromegaly present.  Cardiovascular: Normal rate, regular rhythm and normal heart sounds.  Exam reveals no gallop and no friction rub.   No murmur heard. Pulmonary/Chest: Effort normal and breath sounds normal. He has no wheezes. He has no rales.  Abdominal: Soft. Bowel sounds are normal. He exhibits no distension and no mass. There is no tenderness. There is no rebound and no guarding.  Musculoskeletal: Normal range of motion. He exhibits no edema.  Lymphadenopathy:    He has no cervical adenopathy.  Neurological: He is alert and oriented to person, place, and time. He has normal reflexes. No cranial nerve deficit. Gait normal. Coordination normal.  Skin: Skin is warm and dry. No rash noted.  Psychiatric: Mood, memory, affect and judgment normal.  Nursing note and vitals reviewed.   LABORATORY DATA: I have reviewed the laboratory studies listed below  Results for WHITNEY, BINGAMAN (MRN [id number]) as of 03/13/2015 15:12  Ref. Range 01/27/2015 16:20  WBC Latest Ref Range: 4.0-10.5 K/uL 12.2 (H)  RBC  Latest Ref Range: 4.22-5.81 MIL/uL 5.26  Hemoglobin Latest Ref Range: 13.0-17.0 g/dL 16.7  HCT Latest Ref Range: 39.0-52.0 % 48.7  MCV Latest Ref Range: 78.0-100.0 fL 92.6  MCH Latest Ref Range: 26.0-34.0 pg 31.7  MCHC Latest Ref Range: 30.0-36.0 g/dL 34.3  RDW Latest Ref Range: 11.5-15.5 % 14.3  Platelets Latest Ref Range: 150-400 K/uL 414 (H)  Neutrophils Latest Ref Range: 43-77 % 60  Lymphocytes Latest Ref Range: 12-46 % 27  Monocytes Relative Latest Ref Range: 3-12 % 8  Eosinophil Latest Ref Range: 0-5 % 4  Basophil Latest Ref Range: 0-1 % 1  NEUT# Latest Ref Range: 1.7-7.7 K/uL 7.4  Lymphocyte # Latest Ref Range: 0.7-4.0 K/uL 3.3  Monocyte # Latest Ref Range: 0.1-1.0 K/uL 1.0  Eosinophils Absolute Latest Ref Range: 0.0-0.7 K/uL 0.4  Basophils Absolute Latest Ref Range: 0.0-0.1 K/uL 0.1  Sed Rate Latest Ref Range: 0-16 mm/hr 5  Glucose Latest Ref Range: 65-99 mg/dL 110 (H)    RADIOGRAPHIC STUDIES: CLINICAL DATA: 74 year old male former smoker (quit 3 years ago) with 180 pack-year history of smoking, for initial lung cancer screening  EXAM: CT CHEST WITHOUT CONTRAST IMPRESSION: 4 mm nodule in the posterior right upper lobe. Lung-RADS Category 2, benign appearance  or behavior. Continue annual screening with low-dose chest CT without contrast in 12 months.   Electronically Signed  By: Julian Hy M.D.  On: 11/09/2014 13:15  PATHOLOGY: RPRETATION Interpretation Peripheral Blood Flow Cytometry - NO MONOCLONAL B-CELL POPULATION OR ABNORMAL T-CELL PHENOTYPE IDENTIFIED. Susanne Greenhouse MD Pathologist, Electronic Signature (Case signed 01/28/2015)   ASSESSMENT and THERAPY PLAN:  Leukocytosis Thrombocytosis JAK2 V617F mutation negative JAK 2 exon 12 and 13 negative Negative Flow Cytometry Normal ESR, CRP  I discussed with the patient that currently it appears his findings are benign. I would like to however continue following his counts. I have recommended a  follow-up in 3 months with repeat CBC and peripheral smear. We can obtain a CALR are and MPL mutation at that visit.  I have continued to encourage his smoking cessation. He continues to follow with Dr. looking on a regular basis. We will see him as detailed.  Orders Placed This Encounter  Procedures  . CBC with Differential    Standing Status: Future     Number of Occurrences:      Standing Expiration Date: 02/11/2016  . JAK2 V617F, rflx exons 12 and 13    All questions were answered. The patient knows to call the clinic with any problems, questions or concerns. We can certainly see the patient much sooner if necessary. //   This document serves as a record of services personally performed by Ancil Linsey, MD. It was created on her behalf by Janace Hoard, a trained medical scribe. The creation of this record is based on the scribe's personal observations and the provider's statements to them. This document has been checked and approved by the attending provider.  I have reviewed the above documentation for accuracy and completeness, and I agree with the above.  This note was electronically signed.  Kelby Fam. Whitney Muse, MD

## 2015-02-15 LAB — JAK2 V617F, RFLX EXONS 12 AND 13: JAK2 EXONS 12 & 13: NOT DETECTED

## 2015-02-25 ENCOUNTER — Encounter (HOSPITAL_COMMUNITY): Payer: Self-pay | Admitting: Hematology & Oncology

## 2015-03-13 ENCOUNTER — Encounter (HOSPITAL_COMMUNITY): Payer: Self-pay | Admitting: Hematology & Oncology

## 2015-03-14 ENCOUNTER — Other Ambulatory Visit (HOSPITAL_COMMUNITY): Payer: Self-pay | Admitting: *Deleted

## 2015-03-22 ENCOUNTER — Other Ambulatory Visit (HOSPITAL_COMMUNITY): Payer: Self-pay

## 2015-04-18 ENCOUNTER — Encounter: Payer: Self-pay | Admitting: Family Medicine

## 2015-04-18 ENCOUNTER — Ambulatory Visit (INDEPENDENT_AMBULATORY_CARE_PROVIDER_SITE_OTHER): Payer: Medicare HMO | Admitting: Family Medicine

## 2015-04-18 VITALS — BP 130/88 | Ht 68.0 in | Wt 166.1 lb

## 2015-04-18 DIAGNOSIS — E785 Hyperlipidemia, unspecified: Secondary | ICD-10-CM | POA: Insufficient documentation

## 2015-04-18 DIAGNOSIS — J449 Chronic obstructive pulmonary disease, unspecified: Secondary | ICD-10-CM | POA: Diagnosis not present

## 2015-04-18 DIAGNOSIS — Z23 Encounter for immunization: Secondary | ICD-10-CM | POA: Diagnosis not present

## 2015-04-18 DIAGNOSIS — I10 Essential (primary) hypertension: Secondary | ICD-10-CM | POA: Diagnosis not present

## 2015-04-18 MED ORDER — BUPROPION HCL ER (SR) 150 MG PO TB12: 150.0000 mg | ORAL_TABLET | Freq: Two times a day (BID) | ORAL | Status: DC

## 2015-04-18 MED ORDER — LISINOPRIL 5 MG PO TABS: 5.0000 mg | ORAL_TABLET | Freq: Every day | ORAL | Status: DC

## 2015-04-18 MED ORDER — PREDNISONE 20 MG PO TABS: ORAL_TABLET | ORAL | Status: DC

## 2015-04-18 NOTE — Progress Notes (Signed)
   Subjective:    Patient ID: George Meyer, male    DOB: 05-26-1941, 74 y.o.   MRN: [id number]  Hypertension This is a chronic problem. The current episode started more than 1 year ago. The problem has been gradually improving since onset. Pertinent negatives include no chest pain. There are no associated agents to hypertension. There are no known risk factors for coronary artery disease. Treatments tried: lisinopril. The current treatment provides moderate improvement. There are no compliance problems.    Patient states that he has no concerns at this time.  Patient still smokes a few cigarettes a day he knows he needs to quit could not tolerate Chantix He isn't send try something different COPD intermittent troubles with this cannot afford medication. He knows smoking makes it worse. Uses albuterol seems to help. 2 no longer afford Symbicort. Patient states blood pressure is doing okay. Patient denies any significant reflux issues currently Review of Systems  Constitutional: Negative for activity change, appetite change and fatigue.  HENT: Negative for congestion.   Respiratory: Negative for cough.   Cardiovascular: Negative for chest pain.  Gastrointestinal: Negative for abdominal pain.  Endocrine: Negative for polydipsia and polyphagia.  Neurological: Negative for weakness.  Psychiatric/Behavioral: Negative for confusion.       Objective:   Physical Exam  Constitutional: He appears well-nourished. No distress.  Cardiovascular: Normal rate, regular rhythm and normal heart sounds.   No murmur heard. Pulmonary/Chest: Effort normal. No respiratory distress. He has wheezes.  Musculoskeletal: He exhibits no edema.  Lymphadenopathy:    He has no cervical adenopathy.  Neurological: He is alert.  Psychiatric: His behavior is normal.  Vitals reviewed.         Assessment & Plan:  Flu vaccine today Prevnar 13 and December Blood pressure good control continue current  medications but we will increase the dose from 2.5 now use 5 mg daily to get blood pressure under better control Hyperlipidemia-continue medication. Check lipid profile. Recommend to stop fenofibrate triglycerides at goal also medicine. Expensive COPD patient cannot afford Symbicort currently will use Pulmicort via nebulizer hopefully this will be less expensive Patient given a prescription prednisone in case he has a flareup of COPD in the next few months he continues this he is to follow-up here if any problems he understands that. Patient still smokes a few cigarettes a day I recommended Wellbutrin twice daily stopped Celexa. 81 mg aspirin recommended for prevention of heart dis 25 minutes was spent with the patient. Greater than half the time was spent in discussion and answering questions and counseling regarding the issues that the patient came in for today.

## 2015-04-22 ENCOUNTER — Other Ambulatory Visit: Payer: Self-pay | Admitting: Family Medicine

## 2015-04-27 ENCOUNTER — Encounter: Payer: Self-pay | Admitting: Family Medicine

## 2015-04-27 LAB — LIPID PANEL
CHOLESTEROL TOTAL: 173 mg/dL (ref 100–199)
Chol/HDL Ratio: 4 ratio units (ref 0.0–5.0)
HDL: 43 mg/dL (ref >39)
LDL Calculated: 102 mg/dL — ABNORMAL HIGH (ref 0–99)
TRIGLYCERIDES: 142 mg/dL (ref 0–149)
VLDL Cholesterol Cal: 28 mg/dL (ref 5–40)

## 2015-04-27 LAB — HEPATIC FUNCTION PANEL
ALBUMIN: 4.5 g/dL (ref 3.5–4.8)
ALT: 9 IU/L (ref 0–44)
AST: 14 IU/L (ref 0–40)
Alkaline Phosphatase: 50 IU/L (ref 39–117)
BILIRUBIN TOTAL: 0.4 mg/dL (ref 0.0–1.2)
BILIRUBIN, DIRECT: 0.11 mg/dL (ref 0.00–0.40)
Total Protein: 7 g/dL (ref 6.0–8.5)

## 2015-05-06 ENCOUNTER — Other Ambulatory Visit: Payer: Self-pay | Admitting: Family Medicine

## 2015-05-15 NOTE — Assessment & Plan Note (Signed)
Leukocytosis and thrombocytosis in the setting of JAK2 V617F mutation negative, JAK 2 exon 12 and 13 negative, Negative Flow Cytometry, and normal ESR and CRP.  Labs today: CBC diff, CALR mutation analysis, MPL mutation analysis, pathologist smear review.  Labs in 4 months: CBC diff  Smoking cessation education provided.  He will continue to follow-up with Dr. Wolfgang Phoenix as directed.  Return in 4 months for follow-up, sooner if necessary.

## 2015-05-15 NOTE — Progress Notes (Signed)
Sallee Lange, MD [place] [address]  Leukocytosis - Plan: MPL mutation analysis, Calreticulin (CALR) Mutation Analysis, Pathologist smear review, MPL mutation analysis, Calreticulin (CALR) Mutation Analysis, Pathologist smear review  CURRENT THERAPY: Observation  INTERVAL HISTORY: George Meyer 74 y.o. male returns for followup of leukocytosis and thrombocytosis in the setting of JAK2 V617F mutation negative, JAK 2 exon 12 and 13 negative, Negative Flow Cytometry, and normal ESR and CRP.  I personally reviewed and went over laboratory results with the patient.  The results are noted within this dictation.  Labs are being updated today.  Patient is provided smoking cessation education today.  Thus far, work-up has been benign.  As a result, we will complete peripheral work-up today.  I've provided the patient education on benign causes of leukocytosis with thrombocytosis.  This is likely related to his tobacco abuse.  I believe understands this cause quite well now, after our discussion today.  He is educated on the role of WBC and platelets.  He denies any B symptoms.  He denies any complaints today.  Weight is stable.  "I'm as healthy as a horse."  Past Medical History  Diagnosis Date  . Anxiety   . Hyperlipidemia   . COPD (chronic obstructive pulmonary disease) ([place])   . Pre-diabetes   . Skin cancer, basal cell   . Glaucoma     both eyes    has HYPERTRIGLYCERIDEMIA; ANXIETY; COPD mixed type ([place]); IBS; ABDOMINAL PAIN, LEFT LOWER QUADRANT; BLOOD IN STOOL, OCCULT; Other malaise and fatigue; Leukocytosis; HTN (hypertension); Solitary pulmonary nodule; and Hyperlipidemia on his problem list.     is allergic to amlodipine.  Current Outpatient Prescriptions on File Prior to Visit  Medication Sig Dispense Refill  . aspirin 81 MG tablet Take 81 mg by mouth daily.    . budesonide (PULMICORT) 0.5 MG/2ML nebulizer solution Take 0.5 mg by  nebulization 2 (two) times daily.    Marland Kitchen buPROPion (WELLBUTRIN SR) 150 MG 12 hr tablet Take 1 tablet (150 mg total) by mouth 2 (two) times daily. 60 tablet 5  . dorzolamide-timolol (COSOPT) 22.3-6.8 MG/ML ophthalmic solution Place 1 drop into both eyes 2 (two) times daily.     Marland Kitchen ibuprofen (ADVIL,MOTRIN) 200 MG tablet Take 400 mg by mouth every 6 (six) hours as needed for headache.    . latanoprost (XALATAN) 0.005 % ophthalmic solution Place 1 drop into both eyes at bedtime.     Marland Kitchen lisinopril (PRINIVIL,ZESTRIL) 5 MG tablet Take 1 tablet (5 mg total) by mouth daily. 30 tablet 5  . pravastatin (PRAVACHOL) 20 MG tablet Take 1 tablet (20 mg total) by mouth daily. 30 tablet 5  . SAW PALMETTO, SERENOA REPENS, PO Take 1 capsule by mouth daily.     . SYMBICORT 160-4.5 MCG/ACT inhaler INHALE TWO PUFFS BY MOUTH TWICE DAILY ** STOP SPIRIVA ** 10.2 g 5  . VENTOLIN HFA 108 (90 BASE) MCG/ACT inhaler INHALE TWO PUFFS BY MOUTH EVERY 4 HOURS AS NEEDED 18 each 5  . predniSONE (DELTASONE) 20 MG tablet 3qd for 3d then 2qd for 3d then 1qd for 3d (Patient not taking: Reported on 05/16/2015) 18 tablet 0   No current facility-administered medications on file prior to visit.    Past Surgical History  Procedure Laterality Date  . Hand surgery Right 2010    dupuytrens excision right little finger  . Cardiac catheterization N/A 12/2005    negative  . Colonoscopy N/A 12/2009  small polyp repeat in 10 years    Denies any headaches, dizziness, double vision, fevers, chills, night sweats, nausea, vomiting, diarrhea, constipation, chest pain, heart palpitations, shortness of breath, blood in stool, black tarry stool, urinary pain, urinary burning, urinary frequency, hematuria.   PHYSICAL EXAMINATION  ECOG PERFORMANCE STATUS: 0 - Asymptomatic  Filed Vitals:   05/16/15 0911  BP: 170/78  Pulse: 58  Temp: 97.8 F (36.6 C)  Resp: 18    GENERAL:alert, no distress, well nourished, well developed, comfortable,  cooperative, smiling and chronically ill appearing with thickened facial skin, unaccompanied today. SKIN: skin color, texture, turgor are normal, no rashes or significant lesions HEAD: Normocephalic, No masses, lesions, tenderness or abnormalities EYES: normal, PERRLA, EOMI, Conjunctiva are pink and non-injected EARS: External ears normal OROPHARYNX:lips, buccal mucosa, and tongue normal and mucous membranes are moist  NECK: supple, no adenopathy, thyroid normal size, non-tender, without nodularity, no stridor, non-tender, trachea midline LYMPH:  no palpable lymphadenopathy BREAST:not examined LUNGS: clear to auscultation and percussion, decreased breath sounds HEART: regular rate & rhythm, no murmurs, no gallops, S1 normal and S2 normal ABDOMEN:abdomen soft, non-tender and normal bowel sounds BACK: Back symmetric, no curvature., No CVA tenderness EXTREMITIES:less then 2 second capillary refill, no joint deformities, effusion, or inflammation, no skin discoloration, no clubbing, no cyanosis  NEURO: alert & oriented x 3 with fluent speech, no focal motor/sensory deficits, gait normal   LABORATORY DATA: CBC    Component Value Date/Time   WBC 12.2* 01/27/2015 1620   WBC 13.0* 01/07/2015 0811   RBC 5.26 01/27/2015 1620   RBC 5.26 01/07/2015 0811   HGB 16.7 01/27/2015 1620   HCT 48.7 01/27/2015 1620   HCT 48.4 01/07/2015 0811   PLT 414* 01/27/2015 1620   MCV 92.6 01/27/2015 1620   MCH 31.7 01/27/2015 1620   MCH 30.6 01/07/2015 0811   MCHC 34.3 01/27/2015 1620   MCHC 33.3 01/07/2015 0811   RDW 14.3 01/27/2015 1620   RDW 15.1 01/07/2015 0811   LYMPHSABS 3.3 01/27/2015 1620   LYMPHSABS 3.3* 01/07/2015 0811   MONOABS 1.0 01/27/2015 1620   EOSABS 0.4 01/27/2015 1620   BASOSABS 0.1 01/27/2015 1620   BASOSABS 0.1 01/07/2015 0811      Chemistry      Component Value Date/Time   NA 138 01/27/2015 1620   NA 140 10/25/2014 0816   K 4.2 01/27/2015 1620   CL 101 01/27/2015 1620   CO2  29 01/27/2015 1620   BUN 12 01/27/2015 1620   BUN 14 10/25/2014 0816   CREATININE 1.05 01/27/2015 1620   CREATININE 1.09 01/07/2014 0705      Component Value Date/Time   CALCIUM 9.3 01/27/2015 1620   ALKPHOS 50 04/26/2015 0803   AST 14 04/26/2015 0803   ALT 9 04/26/2015 0803   BILITOT 0.4 04/26/2015 0803   BILITOT 0.8 01/27/2015 1620        PENDING LABS:   RADIOGRAPHIC STUDIES:  No results found.   PATHOLOGY:    ASSESSMENT AND PLAN:  Leukocytosis Leukocytosis and thrombocytosis in the setting of JAK2 V617F mutation negative, JAK 2 exon 12 and 13 negative, Negative Flow Cytometry, and normal ESR and CRP.  Labs today: CBC diff, CALR mutation analysis, MPL mutation analysis, pathologist smear review.  Labs in 4 months: CBC diff  Smoking cessation education provided.  He will continue to follow-up with Dr. Wolfgang Phoenix as directed.  Return in 4 months for follow-up, sooner if necessary.   THERAPY PLAN:  Continue with observation and  completion of peripheral work-up.  All questions were answered. The patient knows to call the clinic with any problems, questions or concerns. We can certainly see the patient much sooner if necessary.  Patient and plan discussed with Dr. Ancil Linsey and she is in agreement with the aforementioned.   This note is electronically signed by: Doy Mince 05/16/2015 9:32 AM

## 2015-05-16 ENCOUNTER — Encounter (HOSPITAL_BASED_OUTPATIENT_CLINIC_OR_DEPARTMENT_OTHER): Payer: Medicare HMO | Admitting: Oncology

## 2015-05-16 ENCOUNTER — Encounter (HOSPITAL_COMMUNITY): Payer: Self-pay | Admitting: Oncology

## 2015-05-16 ENCOUNTER — Ambulatory Visit (HOSPITAL_COMMUNITY): Payer: Medicare HMO | Admitting: Hematology & Oncology

## 2015-05-16 ENCOUNTER — Encounter (HOSPITAL_COMMUNITY): Payer: Medicare HMO | Attending: Hematology & Oncology

## 2015-05-16 VITALS — BP 170/78 | HR 58 | Temp 97.8°F | Resp 18 | Wt 164.2 lb

## 2015-05-16 DIAGNOSIS — Z720 Tobacco use: Secondary | ICD-10-CM

## 2015-05-16 DIAGNOSIS — D72829 Elevated white blood cell count, unspecified: Secondary | ICD-10-CM | POA: Insufficient documentation

## 2015-05-16 DIAGNOSIS — D473 Essential (hemorrhagic) thrombocythemia: Secondary | ICD-10-CM

## 2015-05-16 LAB — CBC WITH DIFFERENTIAL/PLATELET
BASOS ABS: 0.1 10*3/uL (ref 0.0–0.1)
Basophils Relative: 1 %
Eosinophils Absolute: 0.6 10*3/uL (ref 0.0–0.7)
Eosinophils Relative: 5 %
HCT: 48.2 % (ref 39.0–52.0)
HEMOGLOBIN: 16.2 g/dL (ref 13.0–17.0)
LYMPHS PCT: 24 %
Lymphs Abs: 2.7 10*3/uL (ref 0.7–4.0)
MCH: 31.2 pg (ref 26.0–34.0)
MCHC: 33.6 g/dL (ref 30.0–36.0)
MCV: 92.9 fL (ref 78.0–100.0)
MONOS PCT: 7 %
Monocytes Absolute: 0.8 10*3/uL (ref 0.1–1.0)
Neutro Abs: 7.1 10*3/uL (ref 1.7–7.7)
Neutrophils Relative %: 63 %
PLATELETS: 378 10*3/uL (ref 150–400)
RBC: 5.19 MIL/uL (ref 4.22–5.81)
RDW: 13.9 % (ref 11.5–15.5)
WBC: 11.3 10*3/uL — AB (ref 4.0–10.5)

## 2015-05-16 NOTE — Progress Notes (Signed)
Labs drawn for calr and mpl. Calr had to be manually generated as miscso

## 2015-05-16 NOTE — Patient Instructions (Signed)
[place] at [facility] Discharge Instructions  RECOMMENDATIONS MADE BY THE CONSULTANT AND ANY TEST RESULTS WILL BE SENT TO YOUR REFERRING PHYSICIAN.  Exam and discussion by Robynn Pane, PA-C Will check labs today and if any concerns we will contact you Call with any concerns  Follow-up with labs and office visit in 4 months.  Thank you for choosing [place] at [facility] to provide your oncology and hematology care.  To afford each patient quality time with our provider, please arrive at least 15 minutes before your scheduled appointment time.    You need to re-schedule your appointment should you arrive 10 or more minutes late.  We strive to give you quality time with our providers, and arriving late affects you and other patients whose appointments are after yours.  Also, if you no show three or more times for appointments you may be dismissed from the clinic at the providers discretion.     Again, thank you for choosing [facility].  Our hope is that these requests will decrease the amount of time that you wait before being seen by our physicians.       _____________________________________________________________  Should you have questions after your visit to [facility], please contact our office at [phone] between the hours of 8:30 a.m. and 4:30 p.m.  Voicemails left after 4:30 p.m. will not be returned until the following business day.  For prescription refill requests, have your pharmacy contact our office.

## 2015-05-17 LAB — PATHOLOGIST SMEAR REVIEW

## 2015-05-23 LAB — MISC LABCORP TEST (SEND OUT): Labcorp test code: 489450

## 2015-06-01 LAB — MPL MUTATION ANALYSIS

## 2015-08-19 ENCOUNTER — Encounter: Payer: Self-pay | Admitting: Family Medicine

## 2015-08-19 ENCOUNTER — Ambulatory Visit (INDEPENDENT_AMBULATORY_CARE_PROVIDER_SITE_OTHER): Payer: PPO | Admitting: Family Medicine

## 2015-08-19 VITALS — BP 132/82 | Ht 68.0 in | Wt 162.2 lb

## 2015-08-19 DIAGNOSIS — Z79899 Other long term (current) drug therapy: Secondary | ICD-10-CM

## 2015-08-19 DIAGNOSIS — E781 Pure hyperglyceridemia: Secondary | ICD-10-CM | POA: Diagnosis not present

## 2015-08-19 DIAGNOSIS — I10 Essential (primary) hypertension: Secondary | ICD-10-CM | POA: Diagnosis not present

## 2015-08-19 MED ORDER — LISINOPRIL 5 MG PO TABS: 5.0000 mg | ORAL_TABLET | Freq: Every day | ORAL | Status: DC

## 2015-08-19 MED ORDER — ALBUTEROL SULFATE HFA 108 (90 BASE) MCG/ACT IN AERS: INHALATION_SPRAY | RESPIRATORY_TRACT | Status: DC

## 2015-08-19 MED ORDER — BUDESONIDE-FORMOTEROL FUMARATE 160-4.5 MCG/ACT IN AERO: INHALATION_SPRAY | RESPIRATORY_TRACT | Status: DC

## 2015-08-19 NOTE — Patient Instructions (Signed)
At the end of April or May do your labs  Recheck in 6 months

## 2015-08-19 NOTE — Progress Notes (Signed)
   Subjective:    Patient ID: George Meyer, male    DOB: 1941-04-30, 75 y.o.   MRN: [id number]  Hypertension This is a chronic problem. The current episode started more than 1 year ago. There are no compliance problems.    Patient denies any problems with his breathing recently. He is trying to be compliant with his medicine denies issues currently. Patient states no other concerns this visit.  Review of Systems    denies chest tightness pressure pain shortness breath Objective:   Physical Exam Lungs clear pulse normal BP recheck several times was good. Extremities no edema skin warm dry       Assessment & Plan:  Lab work before next visit Safety dietary measures discussed Continue current medications Blood pressure good

## 2015-09-13 ENCOUNTER — Encounter (HOSPITAL_COMMUNITY): Payer: Self-pay | Admitting: Hematology & Oncology

## 2015-09-13 ENCOUNTER — Encounter (HOSPITAL_COMMUNITY): Payer: PPO | Attending: Hematology & Oncology | Admitting: Hematology & Oncology

## 2015-09-13 ENCOUNTER — Encounter (HOSPITAL_COMMUNITY): Payer: PPO

## 2015-09-13 VITALS — BP 174/81 | HR 67 | Temp 98.0°F | Resp 20 | Wt 161.0 lb

## 2015-09-13 DIAGNOSIS — F419 Anxiety disorder, unspecified: Secondary | ICD-10-CM | POA: Insufficient documentation

## 2015-09-13 DIAGNOSIS — K589 Irritable bowel syndrome without diarrhea: Secondary | ICD-10-CM | POA: Insufficient documentation

## 2015-09-13 DIAGNOSIS — D72829 Elevated white blood cell count, unspecified: Secondary | ICD-10-CM

## 2015-09-13 DIAGNOSIS — Z85828 Personal history of other malignant neoplasm of skin: Secondary | ICD-10-CM | POA: Insufficient documentation

## 2015-09-13 DIAGNOSIS — Z87891 Personal history of nicotine dependence: Secondary | ICD-10-CM | POA: Insufficient documentation

## 2015-09-13 DIAGNOSIS — Z720 Tobacco use: Secondary | ICD-10-CM

## 2015-09-13 DIAGNOSIS — J449 Chronic obstructive pulmonary disease, unspecified: Secondary | ICD-10-CM | POA: Diagnosis not present

## 2015-09-13 DIAGNOSIS — I10 Essential (primary) hypertension: Secondary | ICD-10-CM | POA: Diagnosis not present

## 2015-09-13 DIAGNOSIS — R7303 Prediabetes: Secondary | ICD-10-CM | POA: Insufficient documentation

## 2015-09-13 DIAGNOSIS — D473 Essential (hemorrhagic) thrombocythemia: Secondary | ICD-10-CM

## 2015-09-13 DIAGNOSIS — E781 Pure hyperglyceridemia: Secondary | ICD-10-CM | POA: Diagnosis not present

## 2015-09-13 LAB — CBC WITH DIFFERENTIAL/PLATELET
BASOS ABS: 0.1 10*3/uL (ref 0.0–0.1)
Basophils Relative: 1 %
Eosinophils Absolute: 0.7 10*3/uL (ref 0.0–0.7)
Eosinophils Relative: 5 %
HEMATOCRIT: 46.4 % (ref 39.0–52.0)
Hemoglobin: 15.7 g/dL (ref 13.0–17.0)
LYMPHS ABS: 2.5 10*3/uL (ref 0.7–4.0)
LYMPHS PCT: 19 %
MCH: 31.8 pg (ref 26.0–34.0)
MCHC: 33.8 g/dL (ref 30.0–36.0)
MCV: 94.1 fL (ref 78.0–100.0)
Monocytes Absolute: 1.1 10*3/uL — ABNORMAL HIGH (ref 0.1–1.0)
Monocytes Relative: 9 %
NEUTROS ABS: 8.8 10*3/uL — AB (ref 1.7–7.7)
Neutrophils Relative %: 67 %
Platelets: 368 10*3/uL (ref 150–400)
RBC: 4.93 MIL/uL (ref 4.22–5.81)
RDW: 14.3 % (ref 11.5–15.5)
WBC: 13.3 10*3/uL — AB (ref 4.0–10.5)

## 2015-09-13 NOTE — Patient Instructions (Addendum)
[place] at [facility] Discharge Instructions  RECOMMENDATIONS MADE BY THE CONSULTANT AND ANY TEST RESULTS WILL BE SENT TO YOUR REFERRING PHYSICIAN.    Exam and discussion by Dr Whitney Muse today WBC 13.3 today All your blood work is negative  Return to see the doctor in 6 months with labs  Please call the clinic if you have any questions or concerns    Thank you for choosing [place] at [facility] to provide your oncology and hematology care.  To afford each patient quality time with our provider, please arrive at least 15 minutes before your scheduled appointment time.   Beginning January 23rd 2017 lab work for the [facility] will be done in the  Main lab at [facility] on 1st floor. If you have a lab appointment with the [facility] please come in thru the  Main Entrance and check in at the main information desk  You need to re-schedule your appointment should you arrive 10 or more minutes late.  We strive to give you quality time with our providers, and arriving late affects you and other patients whose appointments are after yours.  Also, if you no show three or more times for appointments you may be dismissed from the clinic at the providers discretion.     Again, thank you for choosing [facility].  Our hope is that these requests will decrease the amount of time that you wait before being seen by our physicians.       _____________________________________________________________  Should you have questions after your visit to [facility], please contact our office at [phone] between the hours of 8:30 a.m. and 4:30 p.m.  Voicemails left after 4:30 p.m. will not be returned until the following business day.  For prescription refill requests, have your pharmacy contact our office.         Resources For Cancer Patients and their Caregivers ? American Cancer Society: Can assist with  transportation, wigs, general needs, runs Look Good Feel Better.        267-852-0520 ? Cancer Care: Provides financial assistance, online support groups, medication/co-pay assistance.  1-800-813-HOPE 903-180-9034) ? [facility] Assists [place] cancer patients and their families through emotional , educational and financial support.  (340) 407-5658 ? [place] DSS Where to apply for food stamps, Medicaid and utility assistance. 661-657-6513 ? [facility]: Transportation to medical appointments. (717)880-5647 ? Social Security Administration: May apply for disability if have a Stage IV cancer. 530-061-9363 205-551-3610 ? [employer], Disability and Transit Services: Assists with nutrition, care and transit needs. 4797764523

## 2015-09-13 NOTE — Progress Notes (Signed)
[facility] [place], [place]  [place] [address]   DIAGNOSIS:  Leukocytosis Thrombocytosis JAK2 V617F mutation negative JAK 2 exon 12 and 13 negative Negative Flow Cytometry Normal ESR, CRP  CURRENT THERAPY: Observation  INTERVAL HISTORY: George Meyer 75 y.o. male returns for follow-up of leukocytosis and thrombocytosis. George Meyer returns to the [facility] alone today.  When asked if he has anything planned for the summer, he remarks "just living.''  He notes that his appetite hasn't been "real good" lately. He blames this on the fact that he's tired of eating by himself, tired of cooking, and tired of doing dishes. He notes that he does go to [employer] from time to time to "get my veggies."  He notes that he's smoking again. He says that the Chantix was about to drive him crazy. He says that he is now on Welbutrin instead, which has "slowed him down a lot." He's smoking about 1/2ppd.   He denies participating in golf as a hobby, noting that it's "too much walking." He describes himself as fairly inactive.    MEDICAL HISTORY: Past Medical History  Diagnosis Date  . Anxiety   . Hyperlipidemia   . COPD (chronic obstructive pulmonary disease) ([place])   . Pre-diabetes   . Skin cancer, basal cell   . Glaucoma     both eyes    has HYPERTRIGLYCERIDEMIA; ANXIETY; COPD mixed type ([place]); IBS; ABDOMINAL PAIN, LEFT LOWER QUADRANT; BLOOD IN STOOL, OCCULT; Other malaise and fatigue; Leukocytosis; HTN (hypertension); Solitary pulmonary nodule; and Hyperlipidemia on his problem list.     is allergic to amlodipine.  _0 @  SURGICAL HISTORY: Past Surgical History  Procedure Laterality Date  . Hand surgery Right 2010    dupuytrens excision right little finger  . Cardiac catheterization N/A 12/2005    negative  . Colonoscopy N/A 12/2009    small polyp repeat in 10 years    SOCIAL  HISTORY: Social History   Social History  . Marital Status: Single    Spouse Name: N/A  . Number of Children: N/A  . Years of Education: N/A   Occupational History  . Not on file.   Social History Main Topics  . Smoking status: Former Smoker -- 2.00 packs/day for 60 years    Types: Cigarettes    Quit date: 02/29/2012  . Smokeless tobacco: Never Used  . Alcohol Use: No  . Drug Use: No  . Sexual Activity: Not on file   Other Topics Concern  . Not on file   Social History Narrative  Single. 2 children. 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 Smoker, started at 75 yo. He plans on quitting, having Dr. Wolfgang Phoenix prescribe him Chantix. ETOH, he drank in his early years, he hasn't drank in 12 years. No chewing tobacco use. 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  FAMILY HISTORY: Family History  Problem Relation Age of Onset  . Heart attack Father   . Heart attack Mother   . Lung cancer Brother   indicated that his mother is deceased. He indicated that his father is deceased. He indicated that his brother is deceased.  Mother deceased at 31 yo from heart attack. Father deceased at 35 yo from heart attack. 5 siblings deceased. He is the youngest. 1 died of lung cancer, having never smoked. 1 died of polio.  1 died "drinking himself to death" 1 did of unknown causes  Review of Systems  Constitutional: Positive for weight loss. Negative for fever, chills and malaise/fatigue.  HENT: Negative for congestion, hearing loss, nosebleeds, sore throat and tinnitus.   Eyes: Negative for blurred vision, double vision, pain and discharge.  Respiratory: Negative for cough, hemoptysis, sputum production, shortness of breath and wheezing.   Cardiovascular: Negative for chest pain, palpitations, claudication, leg swelling and PND.  Gastrointestinal: Negative for heartburn, nausea, vomiting, abdominal pain,  diarrhea, constipation, blood in stool and melena.  Genitourinary: Negative for dysuria, urgency, frequency and hematuria.  Musculoskeletal: Negative for myalgias, joint pain and falls.  Skin: Negative for itching and rash.  Neurological: Negative for dizziness, tingling, tremors, sensory change, speech change, focal weakness, seizures, loss of consciousness, weakness and headaches.  Endo/Heme/Allergies: Does not bruise/bleed easily.  Psychiatric/Behavioral: Negative for depression, suicidal ideas, memory loss and substance abuse. The patient is not nervous/anxious and does not have insomnia.   14 point review of systems was performed and is negative except as detailed under history of present illness and above   PHYSICAL EXAMINATION  ECOG PERFORMANCE STATUS: 1 - Symptomatic but completely ambulatory  Filed Vitals:   09/13/15 1053  BP: 174/81  Pulse: 67  Temp: 98 F (36.7 C)  Resp: 20    Physical Exam  Constitutional: He is oriented to person, place, and time and well-developed, well-nourished, and in no distress.  HENT:  Head: Normocephalic and atraumatic.  Nose: Nose normal.  Mouth/Throat: Oropharynx is clear and moist. No oropharyngeal exudate.  Eyes: Conjunctivae and EOM are normal. Pupils are equal, round, and reactive to light. Right eye exhibits no discharge. Left eye exhibits no discharge. No scleral icterus.  Neck: Normal range of motion. Neck supple. No tracheal deviation present. No thyromegaly present.  Cardiovascular: Normal rate, regular rhythm and normal heart sounds.  Exam reveals no gallop and no friction rub.   No murmur heard. Pulmonary/Chest: Effort normal and breath sounds normal. He has no wheezes. He has no rales.  Abdominal: Soft. Bowel sounds are normal. He exhibits no distension and no mass. There is no tenderness. There is no rebound and no guarding.  Musculoskeletal: Normal range of motion. He exhibits no edema.  Lymphadenopathy:    He has no cervical  adenopathy.  Neurological: He is alert and oriented to person, place, and time. He has normal reflexes. No cranial nerve deficit. Gait normal. Coordination normal.  Skin: Skin is warm and dry. No rash noted.  Psychiatric: Mood, memory, affect and judgment normal.  Nursing note and vitals reviewed.   LABORATORY DATA: I have reviewed the laboratory studies listed below  CBC    Component Value Date/Time   WBC 13.3* 09/13/2015 0932   WBC 13.0* 01/07/2015 0811   RBC 4.93 09/13/2015 0932   RBC 5.26 01/07/2015 0811   HGB 15.7 09/13/2015 0932   HCT 46.4 09/13/2015 0932   HCT 48.4 01/07/2015 0811   PLT 368 09/13/2015 0932   PLT 399* 01/07/2015 0811   MCV 94.1 09/13/2015 0932   MCV 92 01/07/2015 0811   MCH 31.8 09/13/2015 0932   MCH 30.6 01/07/2015 0811   MCHC 33.8 09/13/2015 0932   MCHC 33.3 01/07/2015 0811   RDW 14.3 09/13/2015 0932   RDW 15.1 01/07/2015 0811   LYMPHSABS 2.5 09/13/2015 0932   LYMPHSABS 3.3* 01/07/2015 0811   MONOABS 1.1* 09/13/2015 0932   EOSABS 0.7 09/13/2015 0932   EOSABS 0.5* 01/07/2015 0811   BASOSABS 0.1  09/13/2015 0932   BASOSABS 0.1 01/07/2015 0811    CMP     Component Value Date/Time   NA 138 01/27/2015 1620   NA 140 10/25/2014 0816   K 4.2 01/27/2015 1620   CL 101 01/27/2015 1620   CO2 29 01/27/2015 1620   GLUCOSE 110* 01/27/2015 1620   GLUCOSE 99 10/25/2014 0816   BUN 12 01/27/2015 1620   BUN 14 10/25/2014 0816   CREATININE 1.05 01/27/2015 1620   CREATININE 1.09 01/07/2014 0705   CALCIUM 9.3 01/27/2015 1620   PROT 7.0 04/26/2015 0803   PROT 7.3 01/27/2015 1620   ALBUMIN 4.5 04/26/2015 0803   ALBUMIN 4.5 01/27/2015 1620   AST 14 04/26/2015 0803   ALT 9 04/26/2015 0803   ALKPHOS 50 04/26/2015 0803   BILITOT 0.4 04/26/2015 0803   BILITOT 0.8 01/27/2015 1620   GFRNONAA >60 01/27/2015 1620   GFRAA >60 01/27/2015 1620        RADIOGRAPHIC STUDIES: CLINICAL DATA: 75 year old male former smoker (quit 3 years ago) with 180 pack-year  history of smoking, for initial lung cancer screening  EXAM: CT CHEST WITHOUT CONTRAST IMPRESSION: 4 mm nodule in the posterior right upper lobe. Lung-RADS Category 2, benign appearance or behavior. Continue annual screening with low-dose chest CT without contrast in 12 months.   Electronically Signed  By: Julian Hy M.D.  On: 11/09/2014 13:15  PATHOLOGY: RPRETATION Interpretation Peripheral Blood Flow Cytometry - NO MONOCLONAL B-CELL POPULATION OR ABNORMAL T-CELL PHENOTYPE IDENTIFIED. Susanne Greenhouse MD Pathologist, Electronic Signature (Case signed 01/28/2015)   ASSESSMENT and THERAPY PLAN:  Leukocytosis Thrombocytosis JAK2 V617F mutation negative JAK 2 exon 12 and 13 negative Negative Flow Cytometry Negative MPL mutation Negative CALR mutation Normal ESR, CRP  I discussed with the patient that currently it appears his findings are benign. I would like to however continue following his counts.  I have continued to encourage his smoking cessation. He continues to follow with Dr. Wolfgang Phoenix on a regular basis. We will see him as detailed.  We will move him out to 6 months.   Orders Placed This Encounter  Procedures  . CBC with Differential    Standing Status: Standing     Number of Occurrences: 1     Standing Expiration Date:   . CBC with Differential    Standing Status: Future     Number of Occurrences:      Standing Expiration Date: 09/12/2016    All questions were answered. The patient knows to call the clinic with any problems, questions or concerns. We can certainly see the patient much sooner if necessary.   This document serves as a record of services personally performed by Ancil Linsey, MD. It was created on her behalf by Toni Amend, a trained medical scribe. The creation of this record is based on the scribe's personal observations and the provider's statements to them. This document has been checked and approved by the attending  provider.  I have reviewed the above documentation for accuracy and completeness, and I agree with the above.  This note was electronically signed.  Kelby Fam. Whitney Muse, MD

## 2015-12-01 ENCOUNTER — Other Ambulatory Visit: Payer: Self-pay | Admitting: Family Medicine

## 2015-12-01 NOTE — Telephone Encounter (Signed)
Patient taking both Wellbutrin and Celexa. He stated that he would prefer to take just the Wellbutrin.

## 2015-12-01 NOTE — Telephone Encounter (Signed)
Is not advisable for the patient be on both Wellbutrin and Celexa because of potential for drug interaction. Please talk with the patient find out is he truly taking both. If so we would recommend stopping one or the other. Let me know if any questions or difficulties

## 2015-12-05 NOTE — Telephone Encounter (Signed)
This patient due to medication interactions cannot be on citalopram and Wellbutrin. There is no increased risk of drug interaction neck could cause serotonin syndrome which could be very serious I would recommend talking with the patient. I believe the patient should have well putrid and stopped unless he would rather stop the Celexa

## 2015-12-16 DIAGNOSIS — I10 Essential (primary) hypertension: Secondary | ICD-10-CM | POA: Diagnosis not present

## 2015-12-16 DIAGNOSIS — E781 Pure hyperglyceridemia: Secondary | ICD-10-CM | POA: Diagnosis not present

## 2015-12-16 DIAGNOSIS — Z79899 Other long term (current) drug therapy: Secondary | ICD-10-CM | POA: Diagnosis not present

## 2015-12-17 LAB — HEPATIC FUNCTION PANEL
ALBUMIN: 4.1 g/dL (ref 3.5–4.8)
ALK PHOS: 61 IU/L (ref 39–117)
ALT: 8 IU/L (ref 0–44)
AST: 12 IU/L (ref 0–40)
BILIRUBIN TOTAL: 0.5 mg/dL (ref 0.0–1.2)
Bilirubin, Direct: 0.14 mg/dL (ref 0.00–0.40)
Total Protein: 6.5 g/dL (ref 6.0–8.5)

## 2015-12-17 LAB — BASIC METABOLIC PANEL
BUN/Creatinine Ratio: 16 (ref 10–24)
BUN: 13 mg/dL (ref 8–27)
CO2: 24 mmol/L (ref 18–29)
Calcium: 9.3 mg/dL (ref 8.6–10.2)
Chloride: 101 mmol/L (ref 96–106)
Creatinine, Ser: 0.81 mg/dL (ref 0.76–1.27)
GFR, EST AFRICAN AMERICAN: 100 mL/min/{1.73_m2} (ref >59)
GFR, EST NON AFRICAN AMERICAN: 86 mL/min/{1.73_m2} (ref >59)
Glucose: 95 mg/dL (ref 65–99)
POTASSIUM: 4.5 mmol/L (ref 3.5–5.2)
SODIUM: 141 mmol/L (ref 134–144)

## 2015-12-17 LAB — LIPID PANEL
CHOLESTEROL TOTAL: 133 mg/dL (ref 100–199)
Chol/HDL Ratio: 4 ratio units (ref 0.0–5.0)
HDL: 33 mg/dL — ABNORMAL LOW (ref >39)
LDL Calculated: 62 mg/dL (ref 0–99)
TRIGLYCERIDES: 192 mg/dL — AB (ref 0–149)
VLDL CHOLESTEROL CAL: 38 mg/dL (ref 5–40)

## 2015-12-18 ENCOUNTER — Encounter: Payer: Self-pay | Admitting: Family Medicine

## 2016-02-07 ENCOUNTER — Other Ambulatory Visit: Payer: Self-pay | Admitting: Family Medicine

## 2016-02-14 ENCOUNTER — Other Ambulatory Visit: Payer: Self-pay | Admitting: *Deleted

## 2016-02-14 DIAGNOSIS — R911 Solitary pulmonary nodule: Secondary | ICD-10-CM

## 2016-02-14 DIAGNOSIS — J432 Centrilobular emphysema: Secondary | ICD-10-CM | POA: Diagnosis not present

## 2016-02-14 NOTE — Progress Notes (Signed)
ct 

## 2016-02-16 ENCOUNTER — Ambulatory Visit (INDEPENDENT_AMBULATORY_CARE_PROVIDER_SITE_OTHER): Payer: PPO | Admitting: Family Medicine

## 2016-02-16 ENCOUNTER — Encounter: Payer: Self-pay | Admitting: Family Medicine

## 2016-02-16 ENCOUNTER — Telehealth: Payer: Self-pay | Admitting: Family Medicine

## 2016-02-16 VITALS — BP 112/74 | Ht 68.0 in | Wt 160.0 lb

## 2016-02-16 DIAGNOSIS — J449 Chronic obstructive pulmonary disease, unspecified: Secondary | ICD-10-CM

## 2016-02-16 DIAGNOSIS — D225 Melanocytic nevi of trunk: Secondary | ICD-10-CM | POA: Diagnosis not present

## 2016-02-16 DIAGNOSIS — Z23 Encounter for immunization: Secondary | ICD-10-CM

## 2016-02-16 DIAGNOSIS — Z1283 Encounter for screening for malignant neoplasm of skin: Secondary | ICD-10-CM | POA: Diagnosis not present

## 2016-02-16 DIAGNOSIS — L570 Actinic keratosis: Secondary | ICD-10-CM | POA: Diagnosis not present

## 2016-02-16 DIAGNOSIS — F411 Generalized anxiety disorder: Secondary | ICD-10-CM | POA: Diagnosis not present

## 2016-02-16 DIAGNOSIS — R911 Solitary pulmonary nodule: Secondary | ICD-10-CM

## 2016-02-16 DIAGNOSIS — E785 Hyperlipidemia, unspecified: Secondary | ICD-10-CM

## 2016-02-16 DIAGNOSIS — L723 Sebaceous cyst: Secondary | ICD-10-CM | POA: Diagnosis not present

## 2016-02-16 DIAGNOSIS — X32XXXD Exposure to sunlight, subsequent encounter: Secondary | ICD-10-CM | POA: Diagnosis not present

## 2016-02-16 DIAGNOSIS — Z85820 Personal history of malignant melanoma of skin: Secondary | ICD-10-CM | POA: Diagnosis not present

## 2016-02-16 DIAGNOSIS — G4700 Insomnia, unspecified: Secondary | ICD-10-CM | POA: Insufficient documentation

## 2016-02-16 DIAGNOSIS — L720 Epidermal cyst: Secondary | ICD-10-CM | POA: Diagnosis not present

## 2016-02-16 DIAGNOSIS — Z08 Encounter for follow-up examination after completed treatment for malignant neoplasm: Secondary | ICD-10-CM | POA: Diagnosis not present

## 2016-02-16 MED ORDER — BUDESONIDE-FORMOTEROL FUMARATE 160-4.5 MCG/ACT IN AERO: INHALATION_SPRAY | RESPIRATORY_TRACT | 4 refills | Status: DC

## 2016-02-16 MED ORDER — CLONAZEPAM 0.5 MG PO TABS: ORAL_TABLET | ORAL | 1 refills | Status: DC

## 2016-02-16 MED ORDER — ALBUTEROL SULFATE HFA 108 (90 BASE) MCG/ACT IN AERS: INHALATION_SPRAY | RESPIRATORY_TRACT | 4 refills | Status: DC

## 2016-02-16 NOTE — Progress Notes (Signed)
   Subjective:    Patient ID: George Meyer, male    DOB: 03/13/1941, 75 y.o.   MRN: [id number]  Hypertension  This is a chronic problem. The current episode started more than 1 year ago. Compliance problems include exercise.    patient does take his blood pressure medicine on a regular basis. Tries watch diet  Has COPD has not had any flareups recently inhalers help does not smoke  History of solitary pulmonary nodule needs follow-up on CT scan this is coming up in the next week  History leukocytosis no sign of any leukemia has follow-up with oncology hematology next month  Hyperlipidemia takes medication does well with it. Watch his diet recent labs reviewed with patient  Mild insomnia anxiety his ex-wife gave him a Klonopin at nighttime seen to help him he requests a prescription for. We had a long discussion held this type of medicine can be helpful but not to overuse it or to use large doses to minimize risk of side effects he was warned that he may be drowsy not to drive while taking the medication Pt states no concerns or problems.    Review of Systems     Objective:   Physical Exam  lungs has some slight expiratory wheezes heart regular pulse normal BP good extremities no edema  25 minutes was spent with the patient. Greater than half the time was spent in discussion and answering questions and counseling regarding the issues that the patient came in for today.      Assessment & Plan:   HTN good control continue current measures    Pulmonary nodule-follow-up CT scan of the lungs indicated later this month await the results of this.   pneumonia vaccine given today  Mild anxiety and insomnia may use Klonopin at nighttime 0.5 mg  COPD refills given he will be getting these through her pharmacy in [place]  Hyperlipidemia continue medication previous labs reviewed

## 2016-02-16 NOTE — Telephone Encounter (Signed)
Spoke with patient and informed him per Dr.Scott Luking- Dr.Scott is comfortable with Klonopin 0.5 mg 1 daily bedtime when necessary for insomnia. Patient verbalized understanding.

## 2016-02-16 NOTE — Telephone Encounter (Signed)
I am comfortable with Klonopin 0.5 mg 1 daily at bedtime when necessary insomnia, #30, 3 refills

## 2016-02-16 NOTE — Telephone Encounter (Signed)
Pt called stating that the nerve pill that he is on is Klonopin.

## 2016-02-22 ENCOUNTER — Ambulatory Visit (HOSPITAL_COMMUNITY): Admission: RE | Admit: 2016-02-22 | Payer: PPO | Source: Ambulatory Visit

## 2016-03-09 ENCOUNTER — Other Ambulatory Visit: Payer: Self-pay | Admitting: Family Medicine

## 2016-03-14 ENCOUNTER — Ambulatory Visit (HOSPITAL_COMMUNITY)
Admission: RE | Admit: 2016-03-14 | Discharge: 2016-03-14 | Disposition: A | Discharge status: Home / Self Care | Payer: PPO | Source: Ambulatory Visit | Attending: Family Medicine | Admitting: Family Medicine

## 2016-03-14 DIAGNOSIS — J432 Centrilobular emphysema: Secondary | ICD-10-CM | POA: Diagnosis not present

## 2016-03-14 DIAGNOSIS — R911 Solitary pulmonary nodule: Secondary | ICD-10-CM | POA: Diagnosis not present

## 2016-03-14 DIAGNOSIS — I700 Atherosclerosis of aorta: Secondary | ICD-10-CM | POA: Diagnosis not present

## 2016-03-14 DIAGNOSIS — Z87891 Personal history of nicotine dependence: Secondary | ICD-10-CM | POA: Diagnosis not present

## 2016-03-14 DIAGNOSIS — I2510 Atherosclerotic heart disease of native coronary artery without angina pectoris: Secondary | ICD-10-CM | POA: Insufficient documentation

## 2016-03-16 ENCOUNTER — Other Ambulatory Visit (HOSPITAL_COMMUNITY): Payer: PPO

## 2016-03-16 ENCOUNTER — Ambulatory Visit (HOSPITAL_COMMUNITY): Payer: PPO | Admitting: Oncology

## 2016-03-20 ENCOUNTER — Encounter (HOSPITAL_COMMUNITY): Payer: PPO

## 2016-03-20 ENCOUNTER — Encounter (HOSPITAL_COMMUNITY): Payer: PPO | Attending: Oncology | Admitting: Oncology

## 2016-03-20 ENCOUNTER — Encounter (HOSPITAL_COMMUNITY): Payer: Self-pay | Admitting: Oncology

## 2016-03-20 DIAGNOSIS — D72829 Elevated white blood cell count, unspecified: Secondary | ICD-10-CM | POA: Diagnosis not present

## 2016-03-20 DIAGNOSIS — R911 Solitary pulmonary nodule: Secondary | ICD-10-CM

## 2016-03-20 DIAGNOSIS — Z720 Tobacco use: Secondary | ICD-10-CM | POA: Diagnosis not present

## 2016-03-20 DIAGNOSIS — D473 Essential (hemorrhagic) thrombocythemia: Secondary | ICD-10-CM | POA: Diagnosis not present

## 2016-03-20 LAB — CBC WITH DIFFERENTIAL/PLATELET
Basophils Absolute: 0.1 10*3/uL (ref 0.0–0.1)
Basophils Relative: 1 %
EOS ABS: 0.9 10*3/uL — AB (ref 0.0–0.7)
EOS PCT: 6 %
HCT: 48.5 % (ref 39.0–52.0)
Hemoglobin: 16.4 g/dL (ref 13.0–17.0)
LYMPHS ABS: 3 10*3/uL (ref 0.7–4.0)
Lymphocytes Relative: 22 %
MCH: 31.5 pg (ref 26.0–34.0)
MCHC: 33.8 g/dL (ref 30.0–36.0)
MCV: 93.3 fL (ref 78.0–100.0)
MONO ABS: 0.9 10*3/uL (ref 0.1–1.0)
MONOS PCT: 7 %
Neutro Abs: 8.8 10*3/uL — ABNORMAL HIGH (ref 1.7–7.7)
Neutrophils Relative %: 64 %
PLATELETS: 349 10*3/uL (ref 150–400)
RBC: 5.2 MIL/uL (ref 4.22–5.81)
RDW: 14 % (ref 11.5–15.5)
WBC: 13.7 10*3/uL — ABNORMAL HIGH (ref 4.0–10.5)

## 2016-03-20 NOTE — Patient Instructions (Signed)
[place] at [facility] Discharge Instructions  RECOMMENDATIONS MADE BY THE CONSULTANT AND ANY TEST RESULTS WILL BE SENT TO YOUR REFERRING PHYSICIAN.   You were seen today by Kirby Crigler PA-C. Return in 6 months for labs and follow up visit.  Follow up with PCP regarding angina episode and flu shot.    Thank you for choosing [place] at [facility] to provide your oncology and hematology care.  To afford each patient quality time with our provider, please arrive at least 15 minutes before your scheduled appointment time.   Beginning January 23rd 2017 lab work for the [facility] will be done in the  Main lab at [facility] on 1st floor. If you have a lab appointment with the [facility] please come in thru the  Main Entrance and check in at the main information desk  You need to re-schedule your appointment should you arrive 10 or more minutes late.  We strive to give you quality time with our providers, and arriving late affects you and other patients whose appointments are after yours.  Also, if you no show three or more times for appointments you may be dismissed from the clinic at the providers discretion.     Again, thank you for choosing [facility].  Our hope is that these requests will decrease the amount of time that you wait before being seen by our physicians.       _____________________________________________________________  Should you have questions after your visit to [facility], please contact our office at [phone] between the hours of 8:30 a.m. and 4:30 p.m.  Voicemails left after 4:30 p.m. will not be returned until the following business day.  For prescription refill requests, have your pharmacy contact our office.         Resources For Cancer Patients and their Caregivers ? American Cancer Society: Can assist with transportation, wigs, general needs, runs Look Good Feel Better.         541-299-1434 ? Cancer Care: Provides financial assistance, online support groups, medication/co-pay assistance.  1-800-813-HOPE (681)099-9449) ? [facility] [place] Assists [place] cancer patients and their families through emotional , educational and financial support.  289-074-9360 ? [place] DSS Where to apply for food stamps, Medicaid and utility assistance. (807) 506-4545 ? [facility]: Transportation to medical appointments. 202-584-7675 ? Social Security Administration: May apply for disability if have a Stage IV cancer. (313)178-8974 (709) 173-3320 ? [employer], Disability and Transit Services: Assists with nutrition, care and transit needs. [place] Support Programs: @10RELATIVEDAYS @ > Cancer Support Group  2nd Tuesday of the month 1pm-2pm, Journey Room  > Creative Journey  3rd Tuesday of the month 1130am-1pm, Journey Room  > Look Good Feel Better  1st Wednesday of the month 10am-12 noon, Journey Room (Call [place] to register [phone])

## 2016-03-20 NOTE — Assessment & Plan Note (Addendum)
Leukocytosis with thrombocytosis.  Negative hematologic work-up including JAK2 V617F mutation negative, JAK 2 exon 12 and 13 negative, and negative Flow Cytometry.  Labs today: CBC diff.  I personally reviewed and went over laboratory results with the patient.  The results are noted within this dictation.  Labs in 6 months: CBC diff.  Smoking cessation education is provided.    Of note, recent CT of chest for follow-up on pulmonary nodule is reviewed.  Nodule is stable and radiology recommendation is to continue annual imaging.  I will defer to Dr. Wolfgang Phoenix who has been following.  He notes an episode of angina lasting 1 hour on Saturday.  He notes SOB and diaphoresis.  He nearly came to the ED, but it eased some and therefore he did not report to ED.  He denies any symptoms today.  He will discuss with his PCP next week at appointment.  He is advised to report to ED with recurrence.  Return in 6 months for follow-up.  He is advised of signs/symptoms to report to [place] including, but not limited to, unexplained fevers/chills/nigh sweats and unintentional weight loss.

## 2016-03-20 NOTE — Progress Notes (Signed)
Sallee Lange, MD [place] [address]  Leukocytosis - Plan: CBC with Differential  CURRENT THERAPY: Observation  INTERVAL HISTORY: George Meyer 75 y.o. male returns for followup of Leukocytosis with thrombocytosis.  Negative hematologic work-up including JAK2 V617F mutation negative, JAK 2 exon 12 and 13 negative, and negative Flow Cytometry. No BCR/ABL or bone marrow aspiration/biopsy performed to date.  He denies any B symptoms.  He continues to smoke.    He relays an episode of angina lasting 1 hour on Saturday.  This was accompanied by diaphoresis.  He notes that he rested and it resolved spontaneously after 1 hour.  "10 more minutes and I was coming to the ED."  He is on ASA daily.  I do not see any nitroglycerin on his medication list.  He is educated on signs and symptoms of MI.  Review of Systems  Constitutional: Negative.  Negative for chills, fever and weight loss.  HENT: Negative.   Eyes: Negative.  Negative for blurred vision.  Respiratory: Negative.  Negative for cough.   Cardiovascular: Negative for chest pain (None currently).  Gastrointestinal: Negative.   Genitourinary: Negative.   Musculoskeletal: Negative.   Skin: Negative.   Neurological: Negative.  Negative for weakness.  Endo/Heme/Allergies: Negative.   Psychiatric/Behavioral: Negative.     Past Medical History:  Diagnosis Date  . Anxiety   . COPD (chronic obstructive pulmonary disease) ([place])   . Glaucoma    both eyes  . Hyperlipidemia   . Pre-diabetes   . Skin cancer, basal cell     Past Surgical History:  Procedure Laterality Date  . CARDIAC CATHETERIZATION N/A 12/2005   negative  . COLONOSCOPY N/A 12/2009   small polyp repeat in 10 years  . HAND SURGERY Right 2010   dupuytrens excision right little finger    Family History  Problem Relation Age of Onset  . Heart attack Father   . Heart attack Mother   . Lung cancer Brother     Social History    Social History  . Marital status: Single    Spouse name: N/A  . Number of children: N/A  . Years of education: N/A   Social History Main Topics  . Smoking status: Current Some Day Smoker    Packs/day: 2.00    Years: 60.00    Types: Cigarettes    Last attempt to quit: 02/29/2012  . Smokeless tobacco: Never Used  . Alcohol use No  . Drug use: No  . Sexual activity: Not Asked   Other Topics Concern  . None   Social History Narrative  . None     PHYSICAL EXAMINATION  ECOG PERFORMANCE STATUS: 1 - Symptomatic but completely ambulatory  Vitals:   03/20/16 1055  BP: (!) 177/86  Pulse: 68  Resp: 16  Temp: 98.3 F (36.8 C)    GENERAL:alert, no distress, well nourished, well developed, comfortable, cooperative, smiling and un accompanied, chronically ill appearing. SKIN: skin color, texture, turgor are normal, no rashes or significant lesions HEAD: Normocephalic, No masses, lesions, tenderness or abnormalities EYES: normal, EOMI, Conjunctiva are pink and non-injected EARS: External ears normal OROPHARYNX:lips, buccal mucosa, and tongue normal and mucous membranes are moist  NECK: supple, no adenopathy, thyroid normal size, non-tender, without nodularity, trachea midline LYMPH:  no palpable lymphadenopathy BREAST:not examined LUNGS: positive findings: rhonchi  diffusely, wheezing  diffusely HEART: regular rate & rhythm ABDOMEN:abdomen soft and normal bowel sounds BACK: Back symmetric, no  curvature. EXTREMITIES:less then 2 second capillary refill, no joint deformities, effusion, or inflammation, no skin discoloration, no cyanosis  NEURO: alert & oriented x 3 with fluent speech, no focal motor/sensory deficits, gait normal   LABORATORY DATA: CBC    Component Value Date/Time   WBC 13.7 (H) 03/20/2016 0936   RBC 5.20 03/20/2016 0936   HGB 16.4 03/20/2016 0936   HCT 48.5 03/20/2016 0936   HCT 48.4 01/07/2015 0811   PLT 349 03/20/2016 0936   PLT 399 (H) 01/07/2015  0811   MCV 93.3 03/20/2016 0936   MCV 92 01/07/2015 0811   MCH 31.5 03/20/2016 0936   MCHC 33.8 03/20/2016 0936   RDW 14.0 03/20/2016 0936   RDW 15.1 01/07/2015 0811   LYMPHSABS 3.0 03/20/2016 0936   LYMPHSABS 3.3 (H) 01/07/2015 0811   MONOABS 0.9 03/20/2016 0936   EOSABS 0.9 (H) 03/20/2016 0936   EOSABS 0.5 (H) 01/07/2015 0811   BASOSABS 0.1 03/20/2016 0936   BASOSABS 0.1 01/07/2015 0811      Chemistry      Component Value Date/Time   NA 141 12/16/2015 0806   K 4.5 12/16/2015 0806   CL 101 12/16/2015 0806   CO2 24 12/16/2015 0806   BUN 13 12/16/2015 0806   CREATININE 0.81 12/16/2015 0806   CREATININE 1.09 01/07/2014 0705      Component Value Date/Time   CALCIUM 9.3 12/16/2015 0806   ALKPHOS 61 12/16/2015 0806   AST 12 12/16/2015 0806   ALT 8 12/16/2015 0806   BILITOT 0.5 12/16/2015 0806        PENDING LABS:   RADIOGRAPHIC STUDIES:  Ct Chest Nodule Follow Up Low Dose W/o  Result Date: 03/14/2016 CLINICAL DATA:  75 year old male former smoker with 180 pack-year smoking history, quit smoking 4 years prior. EXAM: CT CHEST WITHOUT CONTRAST TECHNIQUE: Multidetector CT imaging of the chest was performed following the standard protocol without IV contrast. COMPARISON:  11/09/2014 screening chest CT. FINDINGS: Cardiovascular: Normal heart size. No significant pericardial fluid/thickening. Left main, left anterior descending, left circumflex and right coronary atherosclerosis. Atherosclerotic nonaneurysmal thoracic aorta. Normal caliber pulmonary arteries. Mediastinum/Nodes: No discrete thyroid nodules. Unremarkable esophagus. No pathologically enlarged axillary, mediastinal or gross hilar lymph nodes, noting limited sensitivity for the detection of hilar adenopathy on this noncontrast study. Stable coarsely calcified granulomatous left hilar nodes. Lungs/Pleura: No pneumothorax. No pleural effusion. Mild centrilobular emphysema with diffuse bronchial wall thickening. A few  scattered pulmonary nodules in both lungs measuring up to 4.7 mm in volume derived mean diameter in the apical right upper lobe (series 3/ image 31) are not appreciably changed. No new significant pulmonary nodules. Calcified granuloma in the left lower lobe is stable. Upper abdomen: Unremarkable. Musculoskeletal: No aggressive appearing focal osseous lesions. Moderate thoracic spondylosis. Stable chronic mild T12 vertebral compression deformity. Stable sclerotic lesion in the T4 vertebral body, probably a benign bone island. IMPRESSION: 1. Lung-RADS Category 2S, benign appearance or behavior. Continue annual screening with low-dose chest CT without contrast in 12 months. 2. The "S" modifier above refers to potentially clinically significant non lung cancer related findings. Specifically, left main and 3 vessel coronary atherosclerosis . 3. Mild centrilobular emphysema with diffuse bronchial wall thickening, suggesting COPD. 4. Aortic atherosclerosis. Electronically Signed   By: Ilona Sorrel M.D.   On: 03/14/2016 12:47     PATHOLOGY:    ASSESSMENT AND PLAN:  Leukocytosis Leukocytosis with thrombocytosis.  Negative hematologic work-up including JAK2 V617F mutation negative, JAK 2 exon 12 and 13 negative, and  negative Flow Cytometry.  Labs today: CBC diff.  I personally reviewed and went over laboratory results with the patient.  The results are noted within this dictation.  Labs in 6 months: CBC diff.  Smoking cessation education is provided.    Of note, recent CT of chest for follow-up on pulmonary nodule is reviewed.  Nodule is stable and radiology recommendation is to continue annual imaging.  I will defer to Dr. Wolfgang Phoenix who has been following.  He notes an episode of angina lasting 1 hour on Saturday.  He notes SOB and diaphoresis.  He nearly came to the ED, but it eased some and therefore he did not report to ED.  He denies any symptoms today.  He will discuss with his PCP next week at  appointment.  He is advised to report to ED with recurrence.  Return in 6 months for follow-up.  He is advised of signs/symptoms to report to [place] including, but not limited to, unexplained fevers/chills/nigh sweats and unintentional weight loss.    ORDERS PLACED FOR THIS ENCOUNTER: Orders Placed This Encounter  Procedures  . CBC with Differential    MEDICATIONS PRESCRIBED THIS ENCOUNTER: No orders of the defined types were placed in this encounter.   THERAPY PLAN:  Continue ongoing observation.  All questions were answered. The patient knows to call the clinic with any problems, questions or concerns. We can certainly see the patient much sooner if necessary.  Patient and plan discussed with Dr. Ancil Linsey and she is in agreement with the aforementioned.   This note is electronically signed by: Doy Mince 03/20/2016 6:57 PM

## 2016-03-27 ENCOUNTER — Ambulatory Visit (INDEPENDENT_AMBULATORY_CARE_PROVIDER_SITE_OTHER): Payer: PPO | Admitting: Family Medicine

## 2016-03-27 ENCOUNTER — Encounter: Payer: Self-pay | Admitting: Family Medicine

## 2016-03-27 VITALS — BP 132/84 | Ht 68.0 in | Wt 156.5 lb

## 2016-03-27 DIAGNOSIS — Z23 Encounter for immunization: Secondary | ICD-10-CM | POA: Diagnosis not present

## 2016-03-27 DIAGNOSIS — R911 Solitary pulmonary nodule: Secondary | ICD-10-CM

## 2016-03-27 DIAGNOSIS — I208 Other forms of angina pectoris: Secondary | ICD-10-CM

## 2016-03-27 MED ORDER — NITROGLYCERIN 0.4 MG SL SUBL: SUBLINGUAL_TABLET | SUBLINGUAL | 3 refills | Status: DC

## 2016-03-27 NOTE — Progress Notes (Signed)
Per Dr Nicki Reaper : Please put in the tickler file for the patient have CT scan of the chest in approximately 12 months-low-dose thank you-pulmonary nodule -Put in tickler file per doctor's orders

## 2016-03-27 NOTE — Progress Notes (Signed)
   Subjective:    Patient ID: George Meyer, male    DOB: Dec 28, 1940, 75 y.o.   MRN: [id number]  HPI Patient in today to discuss Low dose Lung CT results.  This patient had a recent CT scan showed coronary atherosclerosis also showed some small pulmonary nodules. Patient still smokes he's been counseled to quit he tried Chantix but he had side effects tried Wellbutrin did not help he is going to try nicotine patches. States no other concerns this visit. Recent cholesterol LDL is below 70  Patient also is having some chest pressure tightness that occurred over the weekend lasted for 30 minutes did not break out in sweat with it was at rest when it occurred then it went away did not radiate into the arms or the legs or the neck Review of Systems  Constitutional: Negative for activity change, appetite change and fatigue.  HENT: Negative for congestion.   Respiratory: Negative for cough.   Cardiovascular: Positive for chest pain.  Gastrointestinal: Negative for abdominal pain.  Endocrine: Negative for polydipsia and polyphagia.  Neurological: Negative for weakness.  Psychiatric/Behavioral: Negative for confusion.       Objective:   Physical Exam  Constitutional: He appears well-nourished. No distress.  Cardiovascular: Normal rate, regular rhythm and normal heart sounds.   No murmur heard. Pulmonary/Chest: Effort normal and breath sounds normal. No respiratory distress.  Musculoskeletal: He exhibits no edema.  Lymphadenopathy:    He has no cervical adenopathy.  Neurological: He is alert.  Psychiatric: His behavior is normal.  Vitals reviewed.    25 minutes was spent with the patient. Greater than half the time was spent in discussion and answering questions and counseling regarding the issues that the patient came in for today.      Assessment & Plan:  Patient counseled to quit smoking Patient with angina chest pain nitroglycerin given just in case continue 81 mg aspirin  consultation with cardiology for probable stress testing  Calcification of coronary arteries on CT scan-this plays into the above problem may end up needing catheterization if fails stress test  Pulmonary nodules follow-up CT scan in one years time

## 2016-03-28 ENCOUNTER — Encounter: Payer: Self-pay | Admitting: Family Medicine

## 2016-03-30 ENCOUNTER — Encounter: Payer: Self-pay | Admitting: Family Medicine

## 2016-04-20 NOTE — Progress Notes (Signed)
Cardiology Office Note   Date:  04/23/2016   ID:  JARIF NONG, DOB [birth year], MRN [id number]  PCP:  Sallee Lange, MD  Cardiologist:   Jenkins Rouge, MD   No chief complaint on file.     History of Present Illness: George Meyer is a 75 y.o. male who presents for evaluation of "angina".  Last seen by cardiology Dr Wilhemina Cash In 2007 Cath at that time with nonobstructive CAD, normal LV systolic function and normal left heart pressures Smoker. Unable to quit with chantix, welbutrin ? To try patches.  CT lung screen  No nodule but commented on LM/3V coronary  Calcium and aortic atherosclerosis   2 episodes of dizziness last few months first at church sitting 2nd non exertional at home lasted about 45 minutes With some nausea sounded like vertigo Few weeks ago different episode with right sided resting chest pain.   Past Medical History:  Diagnosis Date  . Anxiety   . COPD (chronic obstructive pulmonary disease) ([place])   . Glaucoma    both eyes  . Hyperlipidemia   . Pre-diabetes   . Skin cancer, basal cell     Past Surgical History:  Procedure Laterality Date  . CARDIAC CATHETERIZATION N/A 12/2005   negative  . COLONOSCOPY N/A 12/2009   small polyp repeat in 10 years  . HAND SURGERY Right 2010   dupuytrens excision right little finger     Current Outpatient Prescriptions  Medication Sig Dispense Refill  . albuterol (VENTOLIN HFA) 108 (90 Base) MCG/ACT inhaler INHALE TWO PUFFS BY MOUTH EVERY 4 HOURS AS NEEDED 3 Inhaler 4  . aspirin 81 MG tablet Take 81 mg by mouth daily.    . budesonide (PULMICORT) 0.5 MG/2ML nebulizer solution Take 0.5 mg by nebulization 2 (two) times daily.    . budesonide-formoterol (SYMBICORT) 160-4.5 MCG/ACT inhaler INHALE TWO PUFFS BY MOUTH TWICE DAILY ** STOP SPIRIVA ** 3 Inhaler 4  . citalopram (CELEXA) 10 MG tablet Take 10 mg by mouth daily.    . clonazePAM (KLONOPIN) 0.5 MG tablet Take 1 tablet by mouth at bedtime as needed for insomnia.  20 tablet 1  . dorzolamide-timolol (COSOPT) 22.3-6.8 MG/ML ophthalmic solution Place 1 drop into both eyes 2 (two) times daily.     Marland Kitchen ibuprofen (ADVIL,MOTRIN) 200 MG tablet Take 400 mg by mouth every 6 (six) hours as needed for headache.    . latanoprost (XALATAN) 0.005 % ophthalmic solution Place 1 drop into both eyes at bedtime.     Marland Kitchen lisinopril (PRINIVIL,ZESTRIL) 5 MG tablet Take 1 tablet (5 mg total) by mouth daily. 30 tablet 5  . nitroGLYCERIN (NITROSTAT) 0.4 MG SL tablet 1 sl q 5 min prn chest pressure as directed 50 tablet 3  . pravastatin (PRAVACHOL) 20 MG tablet Take 1 tablet (20 mg total) by mouth daily. 30 tablet 5  . SAW PALMETTO, SERENOA REPENS, PO Take 1 capsule by mouth daily.      No current facility-administered medications for this visit.     Allergies:   Amlodipine    Social History:  The patient  reports that he has been smoking Cigarettes.  He has a 120.00 pack-year smoking history. He has never used smokeless tobacco. He reports that he does not drink alcohol or use drugs.   Family History:  The patient's family history includes Heart attack in his father and mother; Lung cancer in his brother.    ROS:  Please see the history of present illness.  Otherwise, review of systems are positive for none.   All other systems are reviewed and negative.    PHYSICAL EXAM: VS:  BP 122/80   Pulse 79   Ht 5\' 8"  (1.727 m)   Wt 70.8 kg (156 lb)   SpO2 93%   BMI 23.72 kg/m  , BMI Body mass index is 23.72 kg/m. Affect appropriate Healthy:  appears stated age 53: normal Neck supple with no adenopathy JVP normal no bruits no thyromegaly Lungs clear with no wheezing and good diaphragmatic motion Heart:  S1/S2 no murmur, no rub, gallop or click PMI normal Abdomen: benighn, BS positve, no tenderness, no AAA no bruit.  No HSM or HJR Distal pulses intact with no bruits No edema Neuro non-focal Skin warm and dry No muscular weakness    EKG:  SR RBBB 03/28/16      Recent Labs: 12/16/2015: ALT 8; BUN 13; Creatinine, Ser 0.81; Potassium 4.5; Sodium 141 03/20/2016: Hemoglobin 16.4; Platelets 349    Lipid Panel    Component Value Date/Time   CHOL 133 12/16/2015 0806   TRIG 192 (H) 12/16/2015 0806   HDL 33 (L) 12/16/2015 0806   CHOLHDL 4.0 12/16/2015 0806   CHOLHDL 3.8 12/14/2013 0704   VLDL 24 12/14/2013 0704   LDLCALC 62 12/16/2015 0806      Wt Readings from Last 3 Encounters:  04/23/16 70.8 kg (156 lb)  03/27/16 71 kg (156 lb 8 oz)  03/20/16 73 kg (161 lb)      Other studies Reviewed: Additional studies/ records that were reviewed today include: Notes from primary ECG CT scan .    ASSESSMENT AND PLAN:  1. Atherosclerosis :  On CT in smoker with atypical chest pain ECG with RBBB with normal ST segments  F/u ETT 2. Dizziness:  F/u carotid duplex make sure carotids and vertebral's ok.   3. COPD:  F/u [place] thinks he could try chantix again.  CT no nodules f/u lung cancer screeing 4. HTN:  Well controlled.  Continue current medications and low sodium Dash type diet.   5. Chol:  With atherosclerosis on CT continue asa and statin r/o obstructive CAD with ETT   Current medicines are reviewed at length with the patient today.  The patient does not have concerns regarding medicines.  The following changes have been made:  no change  Labs/ tests ordered today include: ETT and carotid duplex  No orders of the defined types were placed in this encounter.    Disposition:   FU with a year      Signed, Jenkins Rouge, MD  04/23/2016 9:19 AM    [facility] [address], [place], [place]  [address] Phone: [phone]; Fax: [phone]

## 2016-04-23 ENCOUNTER — Ambulatory Visit (INDEPENDENT_AMBULATORY_CARE_PROVIDER_SITE_OTHER): Payer: PPO | Admitting: Cardiovascular Disease

## 2016-04-23 ENCOUNTER — Encounter: Payer: Self-pay | Admitting: Cardiovascular Disease

## 2016-04-23 VITALS — BP 122/80 | HR 79 | Ht 68.0 in | Wt 156.0 lb

## 2016-04-23 DIAGNOSIS — R42 Dizziness and giddiness: Secondary | ICD-10-CM | POA: Diagnosis not present

## 2016-04-23 DIAGNOSIS — R9431 Abnormal electrocardiogram [ECG] [EKG]: Secondary | ICD-10-CM

## 2016-04-23 DIAGNOSIS — R079 Chest pain, unspecified: Secondary | ICD-10-CM

## 2016-04-23 NOTE — Patient Instructions (Addendum)
Medication Instructions:  Your physician recommends that you continue on your current medications as directed. Please refer to the Current Medication list given to you today.   Labwork: NONE  Testing/Procedures: Your physician has requested that you have a carotid duplex. This test is an ultrasound of the carotid arteries in your neck. It looks at blood flow through these arteries that supply the brain with blood. Allow one hour for this exam. There are no restrictions or special instructions.    Your physician has requested that you have an exercise tolerance test. For further information please visit [email or web address]. Please also follow instruction sheet, as given.     Follow-Up: Your physician wants you to follow-up in: 1 YEAR .  You will receive a reminder letter in the mail two months in advance. If you don't receive a letter, please call our office to schedule the follow-up appointment.   Any Other Special Instructions Will Be Listed Below (If Applicable).     If you need a refill on your cardiac medications before your next appointment, please call your pharmacy.

## 2016-04-26 ENCOUNTER — Ambulatory Visit (HOSPITAL_COMMUNITY)
Admission: RE | Admit: 2016-04-26 | Discharge: 2016-04-26 | Disposition: A | Discharge status: Home / Self Care | Payer: PPO | Source: Ambulatory Visit | Attending: Cardiovascular Disease | Admitting: Cardiovascular Disease

## 2016-04-26 DIAGNOSIS — R079 Chest pain, unspecified: Secondary | ICD-10-CM | POA: Diagnosis not present

## 2016-04-26 DIAGNOSIS — R42 Dizziness and giddiness: Secondary | ICD-10-CM | POA: Diagnosis not present

## 2016-04-26 DIAGNOSIS — I6523 Occlusion and stenosis of bilateral carotid arteries: Secondary | ICD-10-CM | POA: Diagnosis not present

## 2016-04-26 DIAGNOSIS — R9431 Abnormal electrocardiogram [ECG] [EKG]: Secondary | ICD-10-CM | POA: Diagnosis not present

## 2016-04-26 LAB — EXERCISE TOLERANCE TEST
CHL RATE OF PERCEIVED EXERTION: 15
CSEPED: 1 min
CSEPHR: 75 %
Estimated workload: 4.6 METS
Exercise duration (sec): 19 s
MPHR: 144 {beats}/min
Peak HR: 108 {beats}/min
Rest HR: 79 {beats}/min

## 2016-05-01 ENCOUNTER — Ambulatory Visit: Payer: PPO | Admitting: Cardiology

## 2016-05-10 ENCOUNTER — Other Ambulatory Visit: Payer: Self-pay

## 2016-05-10 DIAGNOSIS — R9431 Abnormal electrocardiogram [ECG] [EKG]: Secondary | ICD-10-CM

## 2016-05-14 ENCOUNTER — Inpatient Hospital Stay (HOSPITAL_COMMUNITY): Admission: RE | Admit: 2016-05-14 | Payer: PPO | Source: Ambulatory Visit

## 2016-05-14 ENCOUNTER — Encounter (HOSPITAL_COMMUNITY)
Admission: RE | Admit: 2016-05-14 | Discharge: 2016-05-14 | Disposition: A | Discharge status: Home / Self Care | Payer: PPO | Source: Ambulatory Visit | Attending: Cardiovascular Disease | Admitting: Cardiovascular Disease

## 2016-05-14 ENCOUNTER — Encounter (HOSPITAL_COMMUNITY): Payer: Self-pay

## 2016-05-14 DIAGNOSIS — R9431 Abnormal electrocardiogram [ECG] [EKG]: Secondary | ICD-10-CM | POA: Diagnosis not present

## 2016-05-14 LAB — NM MYOCAR MULTI W/SPECT W/WALL MOTION / EF
CHL CUP NUCLEAR SDS: 3
CHL CUP NUCLEAR SRS: 1
CHL CUP NUCLEAR SSS: 4
LV sys vol: 20 mL
LVDIAVOL: 59 mL (ref 62–150)
NUC STRESS TID: 0.89
Peak HR: 86 {beats}/min
RATE: 0.19
Rest HR: 64 {beats}/min

## 2016-05-14 MED ORDER — TECHNETIUM TC 99M TETROFOSMIN IV KIT
10.0000 | PACK | Freq: Once | INTRAVENOUS | Status: AC | PRN
  Administered 2016-05-14: 11 via INTRAVENOUS

## 2016-05-14 MED ORDER — SODIUM CHLORIDE 0.9% FLUSH
INTRAVENOUS | Status: AC
  Administered 2016-05-14: 10 mL via INTRAVENOUS
  Filled 2016-05-14: qty 10

## 2016-05-14 MED ORDER — REGADENOSON 0.4 MG/5ML IV SOLN
INTRAVENOUS | Status: AC
  Administered 2016-05-14: 0.4 mg via INTRAVENOUS
  Filled 2016-05-14: qty 5

## 2016-05-14 MED ORDER — TECHNETIUM TC 99M TETROFOSMIN IV KIT
30.0000 | PACK | Freq: Once | INTRAVENOUS | Status: AC | PRN
  Administered 2016-05-14: 32 via INTRAVENOUS

## 2016-07-04 ENCOUNTER — Encounter: Payer: Self-pay | Admitting: Family Medicine

## 2016-07-04 ENCOUNTER — Ambulatory Visit (INDEPENDENT_AMBULATORY_CARE_PROVIDER_SITE_OTHER): Payer: PPO | Admitting: Family Medicine

## 2016-07-04 DIAGNOSIS — J441 Chronic obstructive pulmonary disease with (acute) exacerbation: Secondary | ICD-10-CM | POA: Insufficient documentation

## 2016-07-04 MED ORDER — PREDNISONE 20 MG PO TABS
ORAL_TABLET | ORAL | 1 refills | Status: DC
Start: 2016-07-04 — End: 2016-10-30

## 2016-07-04 MED ORDER — AMOXICILLIN-POT CLAVULANATE 875-125 MG PO TABS
1.0000 | ORAL_TABLET | Freq: Two times a day (BID) | ORAL | 0 refills | Status: DC
Start: 2016-07-04 — End: 2016-10-30

## 2016-07-04 MED ORDER — ALBUTEROL SULFATE (2.5 MG/3ML) 0.083% IN NEBU: 2.5000 mg | INHALATION_SOLUTION | RESPIRATORY_TRACT | 12 refills | Status: DC | PRN

## 2016-07-04 NOTE — Progress Notes (Signed)
   Subjective:    Patient ID: George Meyer, male    DOB: 27-Aug-1940, 76 y.o.   MRN: [id number]  URI   This is a new problem. The current episode started in the past 7 days. The problem has been unchanged. There has been no fever. Associated symptoms include congestion, coughing, rhinorrhea and wheezing. Pertinent negatives include no chest pain or ear pain. Treatments tried: Prednisone. The treatment provided no relief.  Patient with a history of COPD. No longer smokes. Relates a lot of coughing congestion bringing up discolored phlegm denies high fever chills body aches relates a fair amount of wheezing. Some shortness of breath when he is moving around. Patient has no other concerns at this time.    Review of Systems  Constitutional: Negative for activity change and fever.  HENT: Positive for congestion and rhinorrhea. Negative for ear pain.   Eyes: Negative for discharge.  Respiratory: Positive for cough, shortness of breath and wheezing.   Cardiovascular: Negative for chest pain.       Objective:   Physical Exam  Constitutional: He appears well-developed.  HENT:  Head: Normocephalic.  Mouth/Throat: Oropharynx is clear and moist. No oropharyngeal exudate.  Neck: Normal range of motion.  Cardiovascular: Normal rate, regular rhythm and normal heart sounds.   No murmur heard. Pulmonary/Chest: Effort normal. He has wheezes.  Lymphadenopathy:    He has no cervical adenopathy.  Neurological: He exhibits normal muscle tone.  Skin: Skin is warm and dry.  Nursing note and vitals reviewed.         Assessment & Plan:  COPD exacerbation-redness on taper antibiotics frequent albuterol use he may use the albuterol inhaler this is more affordable to him. No other significant aspects on today's exam importance of following up in the spring discussed. Warning signs were discussed in detail.

## 2016-08-17 ENCOUNTER — Ambulatory Visit: Payer: PPO | Admitting: Family Medicine

## 2016-09-10 DIAGNOSIS — H401131 Primary open-angle glaucoma, bilateral, mild stage: Secondary | ICD-10-CM | POA: Diagnosis not present

## 2016-09-18 ENCOUNTER — Other Ambulatory Visit: Payer: Self-pay | Admitting: Family Medicine

## 2016-09-19 ENCOUNTER — Other Ambulatory Visit (HOSPITAL_COMMUNITY): Payer: PPO

## 2016-09-19 ENCOUNTER — Ambulatory Visit (HOSPITAL_COMMUNITY): Payer: PPO | Admitting: Oncology

## 2016-10-17 DIAGNOSIS — L820 Inflamed seborrheic keratosis: Secondary | ICD-10-CM | POA: Diagnosis not present

## 2016-10-17 DIAGNOSIS — X32XXXD Exposure to sunlight, subsequent encounter: Secondary | ICD-10-CM | POA: Diagnosis not present

## 2016-10-17 DIAGNOSIS — L570 Actinic keratosis: Secondary | ICD-10-CM | POA: Diagnosis not present

## 2016-10-30 ENCOUNTER — Encounter: Payer: Self-pay | Admitting: Family Medicine

## 2016-10-30 ENCOUNTER — Ambulatory Visit (INDEPENDENT_AMBULATORY_CARE_PROVIDER_SITE_OTHER): Payer: PPO | Admitting: Family Medicine

## 2016-10-30 VITALS — BP 124/86 | Ht 68.0 in | Wt 156.0 lb

## 2016-10-30 DIAGNOSIS — D72829 Elevated white blood cell count, unspecified: Secondary | ICD-10-CM

## 2016-10-30 DIAGNOSIS — E784 Other hyperlipidemia: Secondary | ICD-10-CM | POA: Diagnosis not present

## 2016-10-30 DIAGNOSIS — J449 Chronic obstructive pulmonary disease, unspecified: Secondary | ICD-10-CM

## 2016-10-30 DIAGNOSIS — Z79899 Other long term (current) drug therapy: Secondary | ICD-10-CM

## 2016-10-30 DIAGNOSIS — E7849 Other hyperlipidemia: Secondary | ICD-10-CM

## 2016-10-30 NOTE — Progress Notes (Signed)
   Subjective:    Patient ID: George Meyer, male    DOB: Jul 25, 1940, 76 y.o.   MRN: [id number]  Hypertension  This is a chronic problem. The current episode started more than 1 year ago. Pertinent negatives include no chest pain, headaches or shortness of breath.   Pt states no concerns or problems.  He denies night sweats fever chills he denies weight loss. States his COPD has been stable. Does take his cholesterol medicine tries watch his diet previous labs reviewed His COPD seems to be stable. Not causing any major flareups. He has learned to live with insomnia does not want to be on any type mood medicines denies being depressed no falls  Review of Systems  Constitutional: Negative for activity change, fatigue and fever.  Respiratory: Negative for cough and shortness of breath.   Cardiovascular: Negative for chest pain and leg swelling.  Neurological: Negative for headaches.       Objective:   Physical Exam  Constitutional: He appears well-nourished. No distress.  Cardiovascular: Normal rate, regular rhythm and normal heart sounds.   No murmur heard. Pulmonary/Chest: Effort normal and breath sounds normal. No respiratory distress.  Musculoskeletal: He exhibits no edema.  Lymphadenopathy:    He has no cervical adenopathy.  Neurological: He is alert.  Psychiatric: His behavior is normal.  Vitals reviewed.  25 minutes was spent with the patient. Greater than half the time was spent in discussion and answering questions and counseling regarding the issues that the patient came in for today.   He has been seen by hematology in the past for leukocytosis he is due for a CBC no sign of any type of leukemia issues     Assessment & Plan:  Cardiac prevention continue aspirin tolerating well  COPD stable continue current measures. Some wheezing noted if flareups this spring or summer follow-up otherwise recheck in the fall  Hyperlipidemia previous labs reviewed current labs  ordered continue medication  Leukocytosis will recheck CBC to monitor how this is doing if dramatically elevated may need to get back in with hematology  Insomnia/moods stable no longer taking Celexa no longer taking her medication  Follow-up 6 months

## 2016-10-31 ENCOUNTER — Other Ambulatory Visit: Payer: Self-pay | Admitting: Family Medicine

## 2016-11-15 ENCOUNTER — Other Ambulatory Visit: Payer: Self-pay | Admitting: *Deleted

## 2016-11-15 MED ORDER — PRAVASTATIN SODIUM 20 MG PO TABS: 20.0000 mg | ORAL_TABLET | Freq: Every day | ORAL | 1 refills | Status: DC

## 2016-11-16 DIAGNOSIS — D72829 Elevated white blood cell count, unspecified: Secondary | ICD-10-CM | POA: Diagnosis not present

## 2016-11-16 DIAGNOSIS — E784 Other hyperlipidemia: Secondary | ICD-10-CM | POA: Diagnosis not present

## 2016-11-16 DIAGNOSIS — Z79899 Other long term (current) drug therapy: Secondary | ICD-10-CM | POA: Diagnosis not present

## 2016-11-17 ENCOUNTER — Encounter: Payer: Self-pay | Admitting: Family Medicine

## 2016-11-17 LAB — BASIC METABOLIC PANEL
BUN / CREAT RATIO: 12 (ref 10–24)
BUN: 10 mg/dL (ref 8–27)
CHLORIDE: 102 mmol/L (ref 96–106)
CO2: 29 mmol/L (ref 18–29)
Calcium: 9.6 mg/dL (ref 8.6–10.2)
Creatinine, Ser: 0.83 mg/dL (ref 0.76–1.27)
GFR calc Af Amer: 98 mL/min/{1.73_m2} (ref >59)
GFR calc non Af Amer: 85 mL/min/{1.73_m2} (ref >59)
GLUCOSE: 101 mg/dL — AB (ref 65–99)
Potassium: 4.9 mmol/L (ref 3.5–5.2)
SODIUM: 144 mmol/L (ref 134–144)

## 2016-11-17 LAB — CBC WITH DIFFERENTIAL/PLATELET
BASOS ABS: 0.1 10*3/uL (ref 0.0–0.2)
Basos: 1 %
EOS (ABSOLUTE): 0.7 10*3/uL — AB (ref 0.0–0.4)
Eos: 7 %
HEMATOCRIT: 49.7 % (ref 37.5–51.0)
Hemoglobin: 16.6 g/dL (ref 13.0–17.7)
IMMATURE GRANULOCYTES: 0 %
Immature Grans (Abs): 0 10*3/uL (ref 0.0–0.1)
Lymphocytes Absolute: 2.5 10*3/uL (ref 0.7–3.1)
Lymphs: 24 %
MCH: 30.7 pg (ref 26.6–33.0)
MCHC: 33.4 g/dL (ref 31.5–35.7)
MCV: 92 fL (ref 79–97)
MONOS ABS: 0.8 10*3/uL (ref 0.1–0.9)
Monocytes: 8 %
NEUTROS PCT: 60 %
Neutrophils Absolute: 6.4 10*3/uL (ref 1.4–7.0)
PLATELETS: 422 10*3/uL — AB (ref 150–379)
RBC: 5.4 x10E6/uL (ref 4.14–5.80)
RDW: 14.6 % (ref 12.3–15.4)
WBC: 10.6 10*3/uL (ref 3.4–10.8)

## 2016-11-17 LAB — HEPATIC FUNCTION PANEL
ALT: 10 IU/L (ref 0–44)
AST: 14 IU/L (ref 0–40)
Albumin: 4.2 g/dL (ref 3.5–4.8)
Alkaline Phosphatase: 63 IU/L (ref 39–117)
BILIRUBIN, DIRECT: 0.09 mg/dL (ref 0.00–0.40)
Bilirubin Total: 0.3 mg/dL (ref 0.0–1.2)
Total Protein: 6.5 g/dL (ref 6.0–8.5)

## 2016-11-17 LAB — LIPID PANEL
CHOL/HDL RATIO: 4.1 ratio (ref 0.0–5.0)
Cholesterol, Total: 138 mg/dL (ref 100–199)
HDL: 34 mg/dL — AB (ref >39)
LDL Calculated: 63 mg/dL (ref 0–99)
TRIGLYCERIDES: 207 mg/dL — AB (ref 0–149)
VLDL CHOLESTEROL CAL: 41 mg/dL — AB (ref 5–40)

## 2016-12-11 ENCOUNTER — Encounter (HOSPITAL_COMMUNITY): Payer: PPO | Attending: Oncology | Admitting: Oncology

## 2016-12-11 ENCOUNTER — Encounter (HOSPITAL_COMMUNITY): Payer: Self-pay | Admitting: Oncology

## 2016-12-11 VITALS — BP 165/80 | HR 73 | Temp 98.1°F | Resp 18 | Wt 156.1 lb

## 2016-12-11 DIAGNOSIS — Z720 Tobacco use: Secondary | ICD-10-CM

## 2016-12-11 DIAGNOSIS — D72829 Elevated white blood cell count, unspecified: Secondary | ICD-10-CM | POA: Diagnosis not present

## 2016-12-11 DIAGNOSIS — D473 Essential (hemorrhagic) thrombocythemia: Secondary | ICD-10-CM | POA: Diagnosis not present

## 2016-12-11 NOTE — Progress Notes (Signed)
Kathyrn Drown, MD [place] [address]  No diagnosis found.  CURRENT THERAPY: Observation  INTERVAL HISTORY: George Meyer 76 y.o. male returns for followup of Leukocytosis with thrombocytosis.  Negative hematologic work-up including JAK2 V617F mutation negative, JAK 2 exon 12 and 13 negative, and negative Flow Cytometry. No BCR/ABL or bone marrow aspiration/biopsy performed to date.  He denies any B symptoms.  He continues to smoke.    Patient is here for ongoing evaluation remains asymptomatic. Patient continues to smoke  Review of Systems  Constitutional: Negative.  Negative for chills, fever and weight loss.  HENT: Negative.   Eyes: Negative.  Negative for blurred vision.  Respiratory: Negative.  Negative for cough.   Cardiovascular: Negative for chest pain (None currently).  Gastrointestinal: Negative.   Genitourinary: Negative.   Musculoskeletal: Negative.   Skin: Negative.   Neurological: Negative.  Negative for weakness.  Endo/Heme/Allergies: Negative.   Psychiatric/Behavioral: Negative.     Past Medical History:  Diagnosis Date  . Anxiety   . COPD (chronic obstructive pulmonary disease) ([place])   . Glaucoma    both eyes  . Hyperlipidemia   . Pre-diabetes   . Skin cancer, basal cell     Past Surgical History:  Procedure Laterality Date  . CARDIAC CATHETERIZATION N/A 12/2005   negative  . COLONOSCOPY N/A 12/2009   small polyp repeat in 10 years  . HAND SURGERY Right 2010   dupuytrens excision right little finger    Family History  Problem Relation Age of Onset  . Heart attack Father   . Heart attack Mother   . Lung cancer Brother     Social History   Social History  . Marital status: Single    Spouse name: N/A  . Number of children: N/A  . Years of education: N/A   Social History Main Topics  . Smoking status: Current Some Day Smoker    Packs/day: 2.00    Years: 60.00    Types: Cigarettes    Last  attempt to quit: 02/29/2012  . Smokeless tobacco: Never Used  . Alcohol use No  . Drug use: No  . Sexual activity: Yes    Partners: Female   Other Topics Concern  . None   Social History Narrative  . None     PHYSICAL EXAMINATION  ECOG PERFORMANCE STATUS: 1 - Symptomatic but completely ambulatory  Vitals:   12/11/16 1450  BP: (!) 165/80  Pulse: 73  Resp: 18  Temp: 98.1 F (36.7 C)    GENERAL:alert, no distress, well nourished, well developed, comfortable, cooperative, smiling and un accompanied, chronically ill appearing. SKIN: skin color, texture, turgor are normal, no rashes or significant lesions HEAD: Normocephalic, No masses, lesions, tenderness or abnormalities EYES: normal, EOMI, Conjunctiva are pink and non-injected EARS: External ears normal OROPHARYNX:lips, buccal mucosa, and tongue normal and mucous membranes are moist  NECK: supple, no adenopathy, thyroid normal size, non-tender, without nodularity, trachea midline LYMPH:  no palpable lymphadenopathy BREAST:not examined LUNGS: positive findings: rhonchi  diffusely, wheezing  diffusely HEART: regular rate & rhythm ABDOMEN:abdomen soft and normal bowel sounds BACK: Back symmetric, no curvature. EXTREMITIES:less then 2 second capillary refill, no joint deformities, effusion, or inflammation, no skin discoloration, no cyanosis  NEURO: alert & oriented x 3 with fluent speech, no focal motor/sensory deficits, gait normal   LABORATORY DATA: CBC    Component Value Date/Time   WBC 10.6 11/16/2016 0805   WBC  13.7 (H) 03/20/2016 0936   RBC 5.40 11/16/2016 0805   RBC 5.20 03/20/2016 0936   HGB 16.6 11/16/2016 0805   HCT 49.7 11/16/2016 0805   PLT 422 (H) 11/16/2016 0805   MCV 92 11/16/2016 0805   MCH 30.7 11/16/2016 0805   MCH 31.5 03/20/2016 0936   MCHC 33.4 11/16/2016 0805   MCHC 33.8 03/20/2016 0936   RDW 14.6 11/16/2016 0805   LYMPHSABS 2.5 11/16/2016 0805   MONOABS 0.9 03/20/2016 0936   EOSABS 0.7  (H) 11/16/2016 0805   BASOSABS 0.1 11/16/2016 0805      Chemistry      Component Value Date/Time   NA 144 11/16/2016 0805   K 4.9 11/16/2016 0805   CL 102 11/16/2016 0805   CO2 29 11/16/2016 0805   BUN 10 11/16/2016 0805   CREATININE 0.83 11/16/2016 0805   CREATININE 1.09 01/07/2014 0705      Component Value Date/Time   CALCIUM 9.6 11/16/2016 0805   ALKPHOS 63 11/16/2016 0805   AST 14 11/16/2016 0805   ALT 10 11/16/2016 0805   BILITOT 0.3 11/16/2016 0805          RADIOGRAPHIC STUDIES:  No results found.   PATHOLOGY:    ASSESSMENT AND PLAN:  leuco cytosis and thrombocytosis  remains asymptomatic Stable white count and platelet count No further investigation needed 2.  Chronic tobacco abuse Patient was counseled regarding smoking cessation program He is getting easy yearly CT scan for lung nodule follow-up by primary care physician   ORDERS PLACED FOR THIS ENCOUNTER: Penicillin chemistry during next visit MEDICATIONS PRESCRIBED THIS ENCOUNTER: No orders of the defined types were placed in this encounter.   THERAPY PLAN:  Continue ongoing observation.  All questions were answered. The patient knows to call the clinic with any problems, questions or concerns. We can certainly see the patient much sooner if necessary.    This note is electronically signed by: Forest Gleason, MD 12/11/2016 3:27 PM

## 2016-12-11 NOTE — Addendum Note (Signed)
Addended by: Farley Ly on: 12/11/2016 03:44 PM   Modules accepted: Orders

## 2016-12-11 NOTE — Patient Instructions (Addendum)
[facility] at [facility] Discharge Instructions  RECOMMENDATIONS MADE BY THE CONSULTANT AND ANY TEST RESULTS WILL BE SENT TO YOUR REFERRING PHYSICIAN.  You were seen today by Dr. Choksi. Return in 1 year for labs and follow up.   Thank you for choosing [facility] at [facility] to provide your oncology and hematology care.  To afford each patient quality time with our provider, please arrive at least 15 minutes before your scheduled appointment time.    If you have a lab appointment with the Cancer Center please come in thru the  Main Entrance and check in at the main information desk  You need to re-schedule your appointment should you arrive 10 or more minutes late.  We strive to give you quality time with our providers, and arriving late affects you and other patients whose appointments are after yours.  Also, if you no show three or more times for appointments you may be dismissed from the clinic at the providers discretion.     Again, thank you for choosing [facility].  Our hope is that these requests will decrease the amount of time that you wait before being seen by our physicians.       _____________________________________________________________  Should you have questions after your visit to [facility], please contact our office at [phone] between the hours of 8:30 a.m. and 4:30 p.m.  Voicemails left after 4:30 p.m. will not be returned until the following business day.  For prescription refill requests, have your pharmacy contact our office.       Resources For Cancer Patients and their Caregivers ? American Cancer Society: Can assist with transportation, wigs, general needs, runs Look Good Feel Better.        1-888-227-6333 ? Cancer Care: Provides financial assistance, online support groups, medication/co-pay assistance.  1-800-813-HOPE (4673) ? [facility] Assists  [place] cancer patients and their families through emotional , educational and financial support.  336-427-4357 ? [place] DSS Where to apply for food stamps, Medicaid and utility assistance. 336-342-1394 ? [facility]: Transportation to medical appointments. 336-347-2287 ? Social Security Administration: May apply for disability if have a Stage IV cancer. 336-342-7796 1-800-772-1213 ? [place] Aging, Disability and Transit Services: Assists with nutrition, care and transit needs. 336-349-2343  Cancer Center Support Programs: @[email or web address]@ > Cancer Support Group  2nd Tuesday of the month 1pm-2pm, Journey Room  > Creative Journey  3rd Tuesday of the month 1130am-1pm, Journey Room  > Look Good Feel Better  1st Wednesday of the month 10am-12 noon, Journey Room (Call American Cancer Society to register 1-800-395-5775)    

## 2016-12-17 ENCOUNTER — Ambulatory Visit (INDEPENDENT_AMBULATORY_CARE_PROVIDER_SITE_OTHER): Payer: PPO | Admitting: Family Medicine

## 2016-12-17 ENCOUNTER — Encounter: Payer: Self-pay | Admitting: Family Medicine

## 2016-12-17 VITALS — BP 128/80 | Temp 98.5°F | Ht 68.0 in | Wt 156.0 lb

## 2016-12-17 DIAGNOSIS — R221 Localized swelling, mass and lump, neck: Secondary | ICD-10-CM | POA: Diagnosis not present

## 2016-12-17 NOTE — Progress Notes (Signed)
   Subjective:    Patient ID: George Meyer, male    DOB: 1940-07-19, 76 y.o.   MRN: [id number]  HPI Lump on left side of neck. Has had this before, was referred to ear, nose and throat Dr in the past. This has reappeared. This patient states he has had a nodule in the left side the neck that seemingly got better after he saw the ENT but now it seems to be getting worse it causes him some slight discomfort when he swallows he denies fever chills or sweats denies weight loss denies appetite change has a history of smoking in the past He denies hemoptysis He does relate some shortness of breath with activity related to his asthma COPD He does have a tobacco history He is worried about the possibility of cancer  Review of Systems See above. No fever chills sweats no weight loss. No difficulty swallowing    Objective:   Physical Exam On physical exam lungs have expiratory wheezes rest real rate is normal eardrums normal throat is normal there is a slight nodularity on the left side of his neck but this could easily be the hyoid bone or adjoining muscles. It is hard to know for certain if this is a growth or a tumor.       Assessment & Plan:  Neck mass-we will lean on the side of caution to do a CT scan of the neck to make sure that there is not a possibility of a tumor going on here. Patient is aware of this he would like to proceed forward.

## 2016-12-18 ENCOUNTER — Telehealth: Payer: Self-pay | Admitting: Family Medicine

## 2016-12-18 NOTE — Telephone Encounter (Signed)
Test scheduled [facility]. June 29th 2pm. Register 1:45. Liquids only 4 hours prior to test. Pt notified and verbalized understanding.

## 2016-12-18 NOTE — Telephone Encounter (Signed)
Kathyrn Drown, MD  [facility] Clinical Pool        Please set up CT scan neck with contrast notify patient of the appointment please   CT ordered.. Left message return call to see when patient can go have CT done.

## 2016-12-18 NOTE — Telephone Encounter (Signed)
Prior auth for CT of neck APPROVED Auth obtained through [employer] # [id number], valid 12/18/16-03/18/17, sent approval to be scanned in chart

## 2016-12-28 ENCOUNTER — Ambulatory Visit (HOSPITAL_COMMUNITY)
Admission: RE | Admit: 2016-12-28 | Discharge: 2016-12-28 | Disposition: A | Discharge status: Home / Self Care | Payer: PPO | Source: Ambulatory Visit | Attending: Family Medicine | Admitting: Family Medicine

## 2016-12-28 DIAGNOSIS — I700 Atherosclerosis of aorta: Secondary | ICD-10-CM | POA: Insufficient documentation

## 2016-12-28 DIAGNOSIS — R221 Localized swelling, mass and lump, neck: Secondary | ICD-10-CM | POA: Diagnosis not present

## 2016-12-28 DIAGNOSIS — R911 Solitary pulmonary nodule: Secondary | ICD-10-CM | POA: Insufficient documentation

## 2016-12-28 DIAGNOSIS — J439 Emphysema, unspecified: Secondary | ICD-10-CM | POA: Insufficient documentation

## 2016-12-28 LAB — POCT I-STAT CREATININE: CREATININE: 0.9 mg/dL (ref 0.61–1.24)

## 2016-12-28 MED ORDER — IOPAMIDOL (ISOVUE-300) INJECTION 61%
75.0000 mL | Freq: Once | INTRAVENOUS | Status: AC | PRN
  Administered 2016-12-28: 75 mL via INTRAVENOUS

## 2016-12-30 ENCOUNTER — Encounter: Payer: Self-pay | Admitting: Family Medicine

## 2016-12-30 DIAGNOSIS — I700 Atherosclerosis of aorta: Secondary | ICD-10-CM | POA: Insufficient documentation

## 2017-01-31 ENCOUNTER — Encounter: Payer: Self-pay | Admitting: Family Medicine

## 2017-01-31 ENCOUNTER — Ambulatory Visit (INDEPENDENT_AMBULATORY_CARE_PROVIDER_SITE_OTHER): Payer: PPO | Admitting: Family Medicine

## 2017-01-31 VITALS — BP 140/86 | Ht 68.0 in | Wt 158.0 lb

## 2017-01-31 DIAGNOSIS — I700 Atherosclerosis of aorta: Secondary | ICD-10-CM | POA: Diagnosis not present

## 2017-01-31 DIAGNOSIS — R911 Solitary pulmonary nodule: Secondary | ICD-10-CM | POA: Diagnosis not present

## 2017-01-31 MED ORDER — CHLORZOXAZONE 500 MG PO TABS
500.0000 mg | ORAL_TABLET | Freq: Four times a day (QID) | ORAL | 0 refills | Status: DC | PRN
Start: 2017-01-31 — End: 2019-07-30

## 2017-01-31 NOTE — Progress Notes (Signed)
   Subjective:    Patient ID: George Meyer, male    DOB: 1940/09/27, 76 y.o.   MRN: [id number]  HPIFollow up CT scan.  Patient had CT scan of the neck because of a possible nodule he states he seems to be doing better there denies any discomfort or difficulty swallowing denies high fever chills sweats  Does have a history of pulmonary nodule needs follow-up CAT scan in September  Back pain for one and a half weeks. States he moved up onto dirt cause pain in the lower back localized to the right side does not radiate down the legs    Review of Systems Denies cough wheeze hemoptysis weight loss fever chills sweats    Objective:   Physical Exam neck no masses lungs clear no crackles lower back mild tenderness negative straight leg raise      Assessment & Plan:  Reassuring CAT scan of the neck  If progressive troubles or issues follow-up  Pulmonary nodule needs repeat CT scan in September  Low back strain muscle relaxers for home use only [place] drowsiness  Aortic atherosclerosis importance of healthy diet keeping cholesterol under control keep blood pressure under control  Leukocytosis for which he saw hematology under good control now but he states that he really was not impressed with his care there I told him that if this reoccurs we'll set him up with different hematology  Follow-up in November

## 2017-01-31 NOTE — Patient Instructions (Signed)
DASH Eating Plan DASH stands for "Dietary Approaches to Stop Hypertension." The DASH eating plan is a healthy eating plan that has been shown to reduce high blood pressure (hypertension). It may also reduce your risk for type 2 diabetes, heart disease, and stroke. The DASH eating plan may also help with weight loss. What are tips for following this plan? General guidelines  Avoid eating more than 2,300 mg (milligrams) of salt (sodium) a day. If you have hypertension, you may need to reduce your sodium intake to 1,500 mg a day.  Limit alcohol intake to no more than 1 drink a day for nonpregnant women and 2 drinks a day for men. One drink equals 12 oz of beer, 5 oz of wine, or 1 oz of hard liquor.  Work with your health care provider to maintain a healthy body weight or to lose weight. Ask what an ideal weight is for you.  Get at least 30 minutes of exercise that causes your heart to beat faster (aerobic exercise) most days of the week. Activities may include walking, swimming, or biking.  Work with your health care provider or diet and nutrition specialist (dietitian) to adjust your eating plan to your individual calorie needs. Reading food labels  Check food labels for the amount of sodium per serving. Choose foods with less than 5 percent of the Daily Value of sodium. Generally, foods with less than 300 mg of sodium per serving fit into this eating plan.  To find whole grains, look for the word "whole" as the first word in the ingredient list. Shopping  Buy products labeled as "low-sodium" or "no salt added."  Buy fresh foods. Avoid canned foods and premade or frozen meals. Cooking  Avoid adding salt when cooking. Use salt-free seasonings or herbs instead of table salt or sea salt. Check with your health care provider or pharmacist before using salt substitutes.  Do not fry foods. Cook foods using healthy methods such as baking, boiling, grilling, and broiling instead.  Cook with  heart-healthy oils, such as olive, canola, soybean, or sunflower oil. Meal planning   Eat a balanced diet that includes: ? 5 or more servings of fruits and vegetables each day. At each meal, try to fill half of your plate with fruits and vegetables. ? Up to 6-8 servings of whole grains each day. ? Less than 6 oz of lean meat, poultry, or fish each day. A 3-oz serving of meat is about the same size as a deck of cards. One egg equals 1 oz. ? 2 servings of low-fat dairy each day. ? A serving of nuts, seeds, or beans 5 times each week. ? Heart-healthy fats. Healthy fats called Omega-3 fatty acids are found in foods such as flaxseeds and coldwater fish, like sardines, salmon, and mackerel.  Limit how much you eat of the following: ? Canned or prepackaged foods. ? Food that is high in trans fat, such as fried foods. ? Food that is high in saturated fat, such as fatty meat. ? Sweets, desserts, sugary drinks, and other foods with added sugar. ? Full-fat dairy products.  Do not salt foods before eating.  Try to eat at least 2 vegetarian meals each week.  Eat more home-cooked food and less restaurant, buffet, and fast food.  When eating at a restaurant, ask that your food be prepared with less salt or no salt, if possible. What foods are recommended? The items listed may not be a complete list. Talk with your dietitian about what   dietary choices are best for you. Grains Whole-grain or whole-wheat bread. Whole-grain or whole-wheat pasta. Brown rice. Oatmeal. Quinoa. Bulgur. Whole-grain and low-sodium cereals. Pita bread. Low-fat, low-sodium crackers. Whole-wheat flour tortillas. Vegetables Fresh or frozen vegetables (raw, steamed, roasted, or grilled). Low-sodium or reduced-sodium tomato and vegetable juice. Low-sodium or reduced-sodium tomato sauce and tomato paste. Low-sodium or reduced-sodium canned vegetables. Fruits All fresh, dried, or frozen fruit. Canned fruit in natural juice (without  added sugar). Meat and other protein foods Skinless chicken or turkey. Ground chicken or turkey. Pork with fat trimmed off. Fish and seafood. Egg whites. Dried beans, peas, or lentils. Unsalted nuts, nut butters, and seeds. Unsalted canned beans. Lean cuts of beef with fat trimmed off. Low-sodium, lean deli meat. Dairy Low-fat (1%) or fat-free (skim) milk. Fat-free, low-fat, or reduced-fat cheeses. Nonfat, low-sodium ricotta or cottage cheese. Low-fat or nonfat yogurt. Low-fat, low-sodium cheese. Fats and oils Soft margarine without trans fats. Vegetable oil. Low-fat, reduced-fat, or light mayonnaise and salad dressings (reduced-sodium). Canola, safflower, olive, soybean, and sunflower oils. Avocado. Seasoning and other foods Herbs. Spices. Seasoning mixes without salt. Unsalted popcorn and pretzels. Fat-free sweets. What foods are not recommended? The items listed may not be a complete list. Talk with your dietitian about what dietary choices are best for you. Grains Baked goods made with fat, such as croissants, muffins, or some breads. Dry pasta or rice meal packs. Vegetables Creamed or fried vegetables. Vegetables in a cheese sauce. Regular canned vegetables (not low-sodium or reduced-sodium). Regular canned tomato sauce and paste (not low-sodium or reduced-sodium). Regular tomato and vegetable juice (not low-sodium or reduced-sodium). Pickles. Olives. Fruits Canned fruit in a light or heavy syrup. Fried fruit. Fruit in cream or butter sauce. Meat and other protein foods Fatty cuts of meat. Ribs. Fried meat. Bacon. Sausage. Bologna and other processed lunch meats. Salami. Fatback. Hotdogs. Bratwurst. Salted nuts and seeds. Canned beans with added salt. Canned or smoked fish. Whole eggs or egg yolks. Chicken or turkey with skin. Dairy Whole or 2% milk, cream, and half-and-half. Whole or full-fat cream cheese. Whole-fat or sweetened yogurt. Full-fat cheese. Nondairy creamers. Whipped toppings.  Processed cheese and cheese spreads. Fats and oils Butter. Stick margarine. Lard. Shortening. Ghee. Bacon fat. Tropical oils, such as coconut, palm kernel, or palm oil. Seasoning and other foods Salted popcorn and pretzels. Onion salt, garlic salt, seasoned salt, table salt, and sea salt. Worcestershire sauce. Tartar sauce. Barbecue sauce. Teriyaki sauce. Soy sauce, including reduced-sodium. Steak sauce. Canned and packaged gravies. Fish sauce. Oyster sauce. Cocktail sauce. Horseradish that you find on the shelf. Ketchup. Mustard. Meat flavorings and tenderizers. Bouillon cubes. Hot sauce and Tabasco sauce. Premade or packaged marinades. Premade or packaged taco seasonings. Relishes. Regular salad dressings. Where to find more information:  National Heart, Lung, and Blood Institute: www.nhlbi.nih.gov  American Heart Association: www.heart.org Summary  The DASH eating plan is a healthy eating plan that has been shown to reduce high blood pressure (hypertension). It may also reduce your risk for type 2 diabetes, heart disease, and stroke.  With the DASH eating plan, you should limit salt (sodium) intake to 2,300 mg a day. If you have hypertension, you may need to reduce your sodium intake to 1,500 mg a day.  When on the DASH eating plan, aim to eat more fresh fruits and vegetables, whole grains, lean proteins, low-fat dairy, and heart-healthy fats.  Work with your health care provider or diet and nutrition specialist (dietitian) to adjust your eating plan to your individual   calorie needs. This information is not intended to replace advice given to you by your health care provider. Make sure you discuss any questions you have with your health care provider. Document Released: 06/07/2011 Document Revised: 06/11/2016 Document Reviewed: 06/11/2016 Elsevier Interactive Patient Education  2017 Elsevier Inc.  

## 2017-02-05 ENCOUNTER — Telehealth: Payer: Self-pay | Admitting: Family Medicine

## 2017-02-05 NOTE — Telephone Encounter (Signed)
Help-what is covered?

## 2017-02-05 NOTE — Telephone Encounter (Signed)
Patient's prior authorization for Chlorzoxazone has been denied by patient's insurance as a non covered medication. Please advise?

## 2017-02-06 NOTE — Telephone Encounter (Signed)
No alternatives were listed

## 2017-02-06 NOTE — Telephone Encounter (Signed)
Explained to the patient that medication not covered by his insurance. Often insurance companies do not want to cover muscle relaxers for individuals over 65. Either he can do without and use stretching in compresses or he would have to pay for medication out of pocket-also he can always call his insurance company to talk with them to find out what other options could be covered since we are not privy to that information

## 2017-02-07 NOTE — Telephone Encounter (Signed)
Spoke with patient and informed him per Dr.Steve Luking- Explained to the patient that medication not covered by his insurance. Often insurance companies do not want to cover muscle relaxers for individuals over 65. Either he can do without and use stretching in compresses or he would have to pay for medication out of pocket-also he can always call his insurance company to talk with them to find out what other options could be covered since we are not privy to that information. Patient verbalized understanding.

## 2017-02-12 ENCOUNTER — Other Ambulatory Visit: Payer: Self-pay | Admitting: *Deleted

## 2017-02-12 MED ORDER — LISINOPRIL 5 MG PO TABS: 5.0000 mg | ORAL_TABLET | Freq: Every day | ORAL | 0 refills | Status: DC

## 2017-02-21 DIAGNOSIS — X32XXXD Exposure to sunlight, subsequent encounter: Secondary | ICD-10-CM | POA: Diagnosis not present

## 2017-02-21 DIAGNOSIS — L570 Actinic keratosis: Secondary | ICD-10-CM | POA: Diagnosis not present

## 2017-02-21 DIAGNOSIS — Z85820 Personal history of malignant melanoma of skin: Secondary | ICD-10-CM | POA: Diagnosis not present

## 2017-02-21 DIAGNOSIS — Z1283 Encounter for screening for malignant neoplasm of skin: Secondary | ICD-10-CM | POA: Diagnosis not present

## 2017-02-21 DIAGNOSIS — C44212 Basal cell carcinoma of skin of right ear and external auricular canal: Secondary | ICD-10-CM | POA: Diagnosis not present

## 2017-02-21 DIAGNOSIS — Z08 Encounter for follow-up examination after completed treatment for malignant neoplasm: Secondary | ICD-10-CM | POA: Diagnosis not present

## 2017-03-18 ENCOUNTER — Ambulatory Visit (HOSPITAL_COMMUNITY)
Admission: RE | Admit: 2017-03-18 | Discharge: 2017-03-18 | Disposition: A | Discharge status: Home / Self Care | Payer: PPO | Source: Ambulatory Visit | Attending: Family Medicine | Admitting: Family Medicine

## 2017-03-18 DIAGNOSIS — R911 Solitary pulmonary nodule: Secondary | ICD-10-CM | POA: Diagnosis present

## 2017-03-18 DIAGNOSIS — M4856XA Collapsed vertebra, not elsewhere classified, lumbar region, initial encounter for fracture: Secondary | ICD-10-CM | POA: Diagnosis not present

## 2017-03-18 DIAGNOSIS — M4854XA Collapsed vertebra, not elsewhere classified, thoracic region, initial encounter for fracture: Secondary | ICD-10-CM | POA: Diagnosis not present

## 2017-03-18 DIAGNOSIS — J439 Emphysema, unspecified: Secondary | ICD-10-CM | POA: Insufficient documentation

## 2017-03-18 DIAGNOSIS — I2510 Atherosclerotic heart disease of native coronary artery without angina pectoris: Secondary | ICD-10-CM | POA: Insufficient documentation

## 2017-03-18 DIAGNOSIS — I700 Atherosclerosis of aorta: Secondary | ICD-10-CM | POA: Diagnosis not present

## 2017-03-19 ENCOUNTER — Other Ambulatory Visit: Payer: Self-pay

## 2017-03-19 DIAGNOSIS — E785 Hyperlipidemia, unspecified: Secondary | ICD-10-CM

## 2017-03-21 DIAGNOSIS — H401131 Primary open-angle glaucoma, bilateral, mild stage: Secondary | ICD-10-CM | POA: Diagnosis not present

## 2017-03-25 DIAGNOSIS — E785 Hyperlipidemia, unspecified: Secondary | ICD-10-CM | POA: Diagnosis not present

## 2017-03-26 ENCOUNTER — Other Ambulatory Visit: Payer: Self-pay | Admitting: Family Medicine

## 2017-03-26 ENCOUNTER — Encounter: Payer: Self-pay | Admitting: Family Medicine

## 2017-03-26 LAB — LIPID PANEL
Chol/HDL Ratio: 3.7 ratio (ref 0.0–5.0)
Cholesterol, Total: 147 mg/dL (ref 100–199)
HDL: 40 mg/dL (ref >39)
LDL Calculated: 73 mg/dL (ref 0–99)
Triglycerides: 172 mg/dL — ABNORMAL HIGH (ref 0–149)
VLDL Cholesterol Cal: 34 mg/dL (ref 5–40)

## 2017-04-01 ENCOUNTER — Ambulatory Visit (INDEPENDENT_AMBULATORY_CARE_PROVIDER_SITE_OTHER): Payer: PPO | Admitting: Family Medicine

## 2017-04-01 ENCOUNTER — Encounter: Payer: Self-pay | Admitting: Family Medicine

## 2017-04-01 VITALS — BP 122/80 | Ht 68.0 in | Wt 148.6 lb

## 2017-04-01 DIAGNOSIS — I10 Essential (primary) hypertension: Secondary | ICD-10-CM

## 2017-04-01 DIAGNOSIS — R634 Abnormal weight loss: Secondary | ICD-10-CM

## 2017-04-01 DIAGNOSIS — J449 Chronic obstructive pulmonary disease, unspecified: Secondary | ICD-10-CM | POA: Diagnosis not present

## 2017-04-01 DIAGNOSIS — Z23 Encounter for immunization: Secondary | ICD-10-CM

## 2017-04-01 DIAGNOSIS — I700 Atherosclerosis of aorta: Secondary | ICD-10-CM

## 2017-04-01 DIAGNOSIS — E781 Pure hyperglyceridemia: Secondary | ICD-10-CM | POA: Diagnosis not present

## 2017-04-01 DIAGNOSIS — D72829 Elevated white blood cell count, unspecified: Secondary | ICD-10-CM

## 2017-04-01 MED ORDER — BUDESONIDE-FORMOTEROL FUMARATE 160-4.5 MCG/ACT IN AERO: INHALATION_SPRAY | RESPIRATORY_TRACT | 3 refills | Status: DC

## 2017-04-01 MED ORDER — ALBUTEROL SULFATE (2.5 MG/3ML) 0.083% IN NEBU: 2.5000 mg | INHALATION_SOLUTION | RESPIRATORY_TRACT | 3 refills | Status: DC | PRN

## 2017-04-01 MED ORDER — PRAVASTATIN SODIUM 40 MG PO TABS: 40.0000 mg | ORAL_TABLET | Freq: Every day | ORAL | 1 refills | Status: DC

## 2017-04-01 NOTE — Progress Notes (Signed)
   Subjective:    Patient ID: George Meyer, male    DOB: 1941-01-20, 76 y.o.   MRN: [id number]  Hypertension  This is a chronic problem. The current episode started more than 1 year ago. Pertinent negatives include no chest pain, headaches or shortness of breath. Risk factors for coronary artery disease include dyslipidemia. Treatments tried: lisinopril. There are no compliance problems.    Would like flu shot today  patient recently had CT scan which show a small nodule bt did not show any major issus  He had evidence of coronary artry disease on theCat cnbut th wsprstfth astseveral years he dnies an hest tightness pressure in  He does stay he's been having some weight loss. He thinks its because food just doesn't taste as good as it used to He denies any rectal bleeding constipation issues or abdominal pain Recent lab work done with his cholesterol taking his medicine watching diet but numbers are not quite where we want them to be He has a history leukocytosis seen needs a follow-up on his lab work denies night sweats fevers chills COPD is been stable inhalers are helping he would like Printed prescriptions today he does not smoke currently Review of Systems  Constitutional: Negative for activity change, fatigue and fever.  Respiratory: Negative for cough and shortness of breath.   Cardiovascular: Negative for chest pain and leg swelling.  Neurological: Negative for headaches.       Objective:   Physical Exam  Constitutional: He appears well-nourished. No distress.  Cardiovascular: Normal rate, regular rhythm and normal heart sounds.   No murmur heard. Pulmonary/Chest: Effort normal and breath sounds normal. No respiratory distress.  Musculoskeletal: He exhibits no edema.  Lymphadenopathy:    He has no cervical adenopathy.  Neurological: He is alert.  Psychiatric: His behavior is normal.  Vitals reviewed.         Assessment & Plan:  Blood pressure good control  continue current measures  Aortic atherosclerosis as well as coronary artery disease stable symptomatology importance of keeping blood pressure cholesterol under good control  Weight loss check stool tests for blood will recheck CBC in November more than likely this is related to his COPD healthy eating regular physical activity discussed  COPD stable prescriptions written for the patient  Hyperlipidemia increase Pravachol to 40 mg to get LDL cholesterol under better control  History leukocytosis repeat CBC in November

## 2017-04-04 DIAGNOSIS — C44212 Basal cell carcinoma of skin of right ear and external auricular canal: Secondary | ICD-10-CM | POA: Diagnosis not present

## 2017-04-04 DIAGNOSIS — X32XXXD Exposure to sunlight, subsequent encounter: Secondary | ICD-10-CM | POA: Diagnosis not present

## 2017-04-04 DIAGNOSIS — L570 Actinic keratosis: Secondary | ICD-10-CM | POA: Diagnosis not present

## 2017-04-19 ENCOUNTER — Other Ambulatory Visit: Payer: Self-pay | Admitting: *Deleted

## 2017-04-19 DIAGNOSIS — R634 Abnormal weight loss: Secondary | ICD-10-CM

## 2017-04-19 LAB — IFOBT (OCCULT BLOOD): IFOBT: NEGATIVE

## 2017-04-29 DIAGNOSIS — D72829 Elevated white blood cell count, unspecified: Secondary | ICD-10-CM | POA: Diagnosis not present

## 2017-04-29 DIAGNOSIS — I10 Essential (primary) hypertension: Secondary | ICD-10-CM | POA: Diagnosis not present

## 2017-04-30 LAB — CBC WITH DIFFERENTIAL/PLATELET
BASOS ABS: 0.1 10*3/uL (ref 0.0–0.2)
BASOS: 0 %
EOS (ABSOLUTE): 0.9 10*3/uL — AB (ref 0.0–0.4)
Eos: 7 %
HEMOGLOBIN: 16.8 g/dL (ref 13.0–17.7)
Hematocrit: 49.3 % (ref 37.5–51.0)
IMMATURE GRANS (ABS): 0 10*3/uL (ref 0.0–0.1)
IMMATURE GRANULOCYTES: 0 %
LYMPHS: 25 %
Lymphocytes Absolute: 3.1 10*3/uL (ref 0.7–3.1)
MCH: 31.7 pg (ref 26.6–33.0)
MCHC: 34.1 g/dL (ref 31.5–35.7)
MCV: 93 fL (ref 79–97)
MONOCYTES: 8 %
Monocytes Absolute: 1 10*3/uL — ABNORMAL HIGH (ref 0.1–0.9)
NEUTROS PCT: 60 %
Neutrophils Absolute: 7.5 10*3/uL — ABNORMAL HIGH (ref 1.4–7.0)
PLATELETS: 393 10*3/uL — AB (ref 150–379)
RBC: 5.3 x10E6/uL (ref 4.14–5.80)
RDW: 14.7 % (ref 12.3–15.4)
WBC: 12.4 10*3/uL — ABNORMAL HIGH (ref 3.4–10.8)

## 2017-04-30 LAB — BASIC METABOLIC PANEL
BUN / CREAT RATIO: 12 (ref 10–24)
BUN: 10 mg/dL (ref 8–27)
CO2: 27 mmol/L (ref 20–29)
CREATININE: 0.83 mg/dL (ref 0.76–1.27)
Calcium: 9.5 mg/dL (ref 8.6–10.2)
Chloride: 104 mmol/L (ref 96–106)
GFR calc Af Amer: 98 mL/min/{1.73_m2} (ref >59)
GFR, EST NON AFRICAN AMERICAN: 85 mL/min/{1.73_m2} (ref >59)
GLUCOSE: 108 mg/dL — AB (ref 65–99)
POTASSIUM: 4.3 mmol/L (ref 3.5–5.2)
SODIUM: 146 mmol/L — AB (ref 134–144)

## 2017-05-02 ENCOUNTER — Ambulatory Visit: Payer: PPO | Admitting: Family Medicine

## 2017-07-03 ENCOUNTER — Other Ambulatory Visit: Payer: Self-pay | Admitting: Family Medicine

## 2017-07-24 ENCOUNTER — Other Ambulatory Visit: Payer: Self-pay | Admitting: *Deleted

## 2017-07-24 MED ORDER — BUDESONIDE-FORMOTEROL FUMARATE 160-4.5 MCG/ACT IN AERO: INHALATION_SPRAY | RESPIRATORY_TRACT | 5 refills | Status: DC

## 2017-08-05 ENCOUNTER — Encounter: Payer: Self-pay | Admitting: Family Medicine

## 2017-08-05 ENCOUNTER — Ambulatory Visit (INDEPENDENT_AMBULATORY_CARE_PROVIDER_SITE_OTHER): Payer: PPO | Admitting: Family Medicine

## 2017-08-05 ENCOUNTER — Other Ambulatory Visit: Payer: Self-pay | Admitting: Family Medicine

## 2017-08-05 VITALS — BP 134/84 | Ht 68.0 in | Wt 149.0 lb

## 2017-08-05 DIAGNOSIS — Z79899 Other long term (current) drug therapy: Secondary | ICD-10-CM

## 2017-08-05 DIAGNOSIS — J449 Chronic obstructive pulmonary disease, unspecified: Secondary | ICD-10-CM

## 2017-08-05 DIAGNOSIS — L570 Actinic keratosis: Secondary | ICD-10-CM | POA: Diagnosis not present

## 2017-08-05 DIAGNOSIS — L820 Inflamed seborrheic keratosis: Secondary | ICD-10-CM | POA: Diagnosis not present

## 2017-08-05 DIAGNOSIS — I10 Essential (primary) hypertension: Secondary | ICD-10-CM | POA: Diagnosis not present

## 2017-08-05 DIAGNOSIS — F5101 Primary insomnia: Secondary | ICD-10-CM | POA: Diagnosis not present

## 2017-08-05 DIAGNOSIS — D72820 Lymphocytosis (symptomatic): Secondary | ICD-10-CM | POA: Diagnosis not present

## 2017-08-05 DIAGNOSIS — X32XXXD Exposure to sunlight, subsequent encounter: Secondary | ICD-10-CM | POA: Diagnosis not present

## 2017-08-05 DIAGNOSIS — E782 Mixed hyperlipidemia: Secondary | ICD-10-CM | POA: Diagnosis not present

## 2017-08-05 MED ORDER — ZOSTER VAC RECOMB ADJUVANTED 50 MCG/0.5ML IM SUSR: 0.5000 mL | Freq: Once | INTRAMUSCULAR | 0 refills | Status: AC

## 2017-08-05 MED ORDER — ALBUTEROL SULFATE HFA 108 (90 BASE) MCG/ACT IN AERS: INHALATION_SPRAY | RESPIRATORY_TRACT | 5 refills | Status: DC

## 2017-08-05 MED ORDER — ALPRAZOLAM 0.5 MG PO TABS: 0.5000 mg | ORAL_TABLET | Freq: Every evening | ORAL | 3 refills | Status: DC | PRN

## 2017-08-05 NOTE — Telephone Encounter (Signed)
May refill x6 

## 2017-08-05 NOTE — Progress Notes (Signed)
Subjective:    Patient ID: George Meyer, male    DOB: 1940/09/25, 77 y.o.   MRN: [id number]  Hypertension  This is a chronic problem. The current episode started more than 1 year ago. Pertinent negatives include no chest pain, headaches or shortness of breath. Compliance problems include exercise (takes meds every day, eats healthy).    Pt states no concerns today.   Patient here for follow-up regarding cholesterol.  Patient does try to maintain a reasonable diet.  Patient does take the medication on a regular basis.  Denies missing a dose.  The patient denies any obvious side effects.  Prior blood work results reviewed with the patient.  The patient is aware of his cholesterol goals and the need to keep it under good control to lessen the risk of disease.  Patient for blood pressure check up. Patient relates compliance with meds. Todays BP reviewed with the patient. Patient denies issues with medication. Patient relates reasonable diet. Patient tries to minimize salt. Patient aware of BP goals.  Patient has history of elevated white blood count he denies night sweats fever chills does state his appetite not as good as it typically is but he relates this to just getting older. Patient with significant insomnia issues would like to try medications significant time spent discussing his insomnia as well as medication choices  Review of Systems  Constitutional: Negative for activity change, appetite change and fatigue.  HENT: Negative for congestion and rhinorrhea.   Respiratory: Negative for cough, chest tightness and shortness of breath.   Cardiovascular: Negative for chest pain and leg swelling.  Gastrointestinal: Negative for abdominal pain, diarrhea and nausea.  Endocrine: Negative for polydipsia and polyphagia.  Genitourinary: Negative for dysuria and hematuria.  Neurological: Negative for weakness and headaches.  Psychiatric/Behavioral: Negative for confusion and dysphoric mood.        Objective:   Physical Exam  Constitutional: He appears well-nourished. No distress.  HENT:  Head: Normocephalic and atraumatic.  Eyes: Right eye exhibits no discharge. Left eye exhibits no discharge.  Neck: No tracheal deviation present.  Cardiovascular: Normal rate, regular rhythm and normal heart sounds.  No murmur heard. Pulmonary/Chest: Effort normal and breath sounds normal. No respiratory distress. He has no wheezes.  Musculoskeletal: He exhibits no edema.  Lymphadenopathy:    He has no cervical adenopathy.  Neurological: He is alert.  Skin: Skin is warm. No rash noted.  Psychiatric: His behavior is normal.  Vitals reviewed.   25 minutes was spent with the patient.  This statement verifies that 25 minutes was indeed spent with the patient. Greater than half the time was spent in discussion, counseling and answering questions  regarding the issues that the patient came in for today as reflected in the diagnosis (s) please refer to documentation for further details.       Assessment & Plan:  1. Primary insomnia Long discussion held with the patient regarding pros and cons of medications patient does not want to try trazodone again.  Patient is not a candidate for Ambien.  He would like to try Xanax he understands this can increase risk of falls and injuries he has taken it before without difficulty he will use it as prescribed intermittently  2. COPD mixed type [facility]) His control of his COPD is doing fairly well with his medications he understands the importance of avoiding smoking.  And using his medication on a regular basis  3. Essential hypertension Blood pressure readings are good I recommend  checking a kidney profile before the next visit continue medication watch diet closely avoid excessive salt - Basic metabolic panel  4. Mixed hyperlipidemia Watch diet closely avoid excessive fatty foods fried foods stay physically active keep weight in check - Lipid  panel  5. Lymphocytosis Periodically monitor CBC check this before next visit oncology states no cancer at the's point but recommend monitoring it - CBC with Differential/Platelet  6. High risk medication use Because of medication check liver profile - Hepatic function panel

## 2017-09-11 DIAGNOSIS — D72820 Lymphocytosis (symptomatic): Secondary | ICD-10-CM | POA: Diagnosis not present

## 2017-09-11 DIAGNOSIS — E782 Mixed hyperlipidemia: Secondary | ICD-10-CM | POA: Diagnosis not present

## 2017-09-11 DIAGNOSIS — Z79899 Other long term (current) drug therapy: Secondary | ICD-10-CM | POA: Diagnosis not present

## 2017-09-11 DIAGNOSIS — I10 Essential (primary) hypertension: Secondary | ICD-10-CM | POA: Diagnosis not present

## 2017-09-12 ENCOUNTER — Encounter: Payer: Self-pay | Admitting: Family Medicine

## 2017-09-12 LAB — CBC WITH DIFFERENTIAL/PLATELET
BASOS ABS: 0.1 10*3/uL (ref 0.0–0.2)
BASOS: 1 %
EOS (ABSOLUTE): 0.7 10*3/uL — AB (ref 0.0–0.4)
Eos: 5 %
HEMOGLOBIN: 16.8 g/dL (ref 13.0–17.7)
Hematocrit: 49.2 % (ref 37.5–51.0)
IMMATURE GRANS (ABS): 0 10*3/uL (ref 0.0–0.1)
Immature Granulocytes: 0 %
LYMPHS: 24 %
Lymphocytes Absolute: 3.3 10*3/uL — ABNORMAL HIGH (ref 0.7–3.1)
MCH: 31.8 pg (ref 26.6–33.0)
MCHC: 34.1 g/dL (ref 31.5–35.7)
MCV: 93 fL (ref 79–97)
MONOCYTES: 8 %
Monocytes Absolute: 1.1 10*3/uL — ABNORMAL HIGH (ref 0.1–0.9)
NEUTROS ABS: 8.4 10*3/uL — AB (ref 1.4–7.0)
NEUTROS PCT: 62 %
PLATELETS: 410 10*3/uL — AB (ref 150–379)
RBC: 5.29 x10E6/uL (ref 4.14–5.80)
RDW: 15.4 % (ref 12.3–15.4)
WBC: 13.5 10*3/uL — ABNORMAL HIGH (ref 3.4–10.8)

## 2017-09-12 LAB — HEPATIC FUNCTION PANEL
ALBUMIN: 4.2 g/dL (ref 3.5–4.8)
ALT: 8 IU/L (ref 0–44)
AST: 15 IU/L (ref 0–40)
Alkaline Phosphatase: 62 IU/L (ref 39–117)
BILIRUBIN TOTAL: 0.3 mg/dL (ref 0.0–1.2)
Bilirubin, Direct: 0.11 mg/dL (ref 0.00–0.40)
Total Protein: 6.5 g/dL (ref 6.0–8.5)

## 2017-09-12 LAB — LIPID PANEL
CHOL/HDL RATIO: 3.4 ratio (ref 0.0–5.0)
Cholesterol, Total: 141 mg/dL (ref 100–199)
HDL: 41 mg/dL (ref >39)
LDL CALC: 71 mg/dL (ref 0–99)
Triglycerides: 146 mg/dL (ref 0–149)
VLDL CHOLESTEROL CAL: 29 mg/dL (ref 5–40)

## 2017-09-12 LAB — BASIC METABOLIC PANEL
BUN/Creatinine Ratio: 14 (ref 10–24)
BUN: 11 mg/dL (ref 8–27)
CALCIUM: 9.5 mg/dL (ref 8.6–10.2)
CHLORIDE: 102 mmol/L (ref 96–106)
CO2: 29 mmol/L (ref 20–29)
Creatinine, Ser: 0.81 mg/dL (ref 0.76–1.27)
GFR, EST AFRICAN AMERICAN: 99 mL/min/{1.73_m2} (ref >59)
GFR, EST NON AFRICAN AMERICAN: 86 mL/min/{1.73_m2} (ref >59)
Glucose: 92 mg/dL (ref 65–99)
POTASSIUM: 4.9 mmol/L (ref 3.5–5.2)
Sodium: 143 mmol/L (ref 134–144)

## 2017-09-16 ENCOUNTER — Encounter: Payer: Self-pay | Admitting: Family Medicine

## 2017-09-16 ENCOUNTER — Ambulatory Visit (INDEPENDENT_AMBULATORY_CARE_PROVIDER_SITE_OTHER): Payer: PPO | Admitting: Family Medicine

## 2017-09-16 VITALS — BP 132/68 | Temp 98.4°F | Ht 68.0 in | Wt 153.2 lb

## 2017-09-16 DIAGNOSIS — J441 Chronic obstructive pulmonary disease with (acute) exacerbation: Secondary | ICD-10-CM | POA: Diagnosis not present

## 2017-09-16 MED ORDER — CEFDINIR 300 MG PO CAPS: 300.0000 mg | ORAL_CAPSULE | Freq: Two times a day (BID) | ORAL | 0 refills | Status: DC

## 2017-09-16 MED ORDER — METHYLPREDNISOLONE ACETATE 40 MG/ML IJ SUSP
40.0000 mg | Freq: Once | INTRAMUSCULAR | Status: AC
  Administered 2017-09-16: 40 mg via INTRAMUSCULAR

## 2017-09-16 NOTE — Progress Notes (Signed)
   Subjective:    Patient ID: George Meyer, male    DOB: Jun 07, 1941, 77 y.o.   MRN: [id number]  URI   This is a new problem. There has been no fever. Associated symptoms include congestion, coughing, rhinorrhea, a sore throat and wheezing. Pertinent negatives include no chest pain, ear pain, nausea or vomiting. Treatments tried: prednisone.   Cough congestion drainage along with some wheezing using inhalers using nebulizer findings some congestion shortness of breath denies high fever chills sweats present over the past several days   Review of Systems  Constitutional: Negative for activity change, chills and fever.  HENT: Positive for congestion, rhinorrhea and sore throat. Negative for ear pain.   Eyes: Negative for discharge.  Respiratory: Positive for cough and wheezing.   Cardiovascular: Negative for chest pain.  Gastrointestinal: Negative for nausea and vomiting.  Musculoskeletal: Negative for arthralgias.       Objective:   Physical Exam  Constitutional: He appears well-developed.  HENT:  Head: Normocephalic and atraumatic.  Mouth/Throat: Oropharynx is clear and moist. No oropharyngeal exudate.  Eyes: Right eye exhibits no discharge. Left eye exhibits no discharge.  Neck: Normal range of motion.  Cardiovascular: Normal rate, regular rhythm and normal heart sounds.  No murmur heard. Pulmonary/Chest: Effort normal and breath sounds normal. No respiratory distress. He has no wheezes. He has no rales.  Lymphadenopathy:    He has no cervical adenopathy.  Neurological: He exhibits normal muscle tone.  Skin: Skin is warm and dry.  Nursing note and vitals reviewed.         Assessment & Plan:  Asthma scratch that COPD exacerbation Depo-Medrol shot prednisone taper should gradually get better antibiotics prescribed Patient was seen today for upper respiratory illness. It is felt that the patient is dealing with sinusitis. Antibiotics were prescribed today. Importance of  compliance with medication was discussed. Symptoms should gradually resolve over the course of the next several days. If high fevers, progressive illness, difficulty breathing, worsening condition or failure for symptoms to improve over the next several days then the patient is to follow-up. If any emergent conditions the patient is to follow-up in the emergency department otherwise to follow-up in the office.

## 2017-09-18 ENCOUNTER — Telehealth: Payer: Self-pay

## 2017-09-18 ENCOUNTER — Other Ambulatory Visit: Payer: Self-pay | Admitting: *Deleted

## 2017-09-18 MED ORDER — PREDNISONE 20 MG PO TABS: ORAL_TABLET | ORAL | 0 refills | Status: DC

## 2017-09-18 NOTE — Telephone Encounter (Signed)
I would recommend a short course of prednisone 20 mg tablet take 2 tablets daily for the next 5 days, continue the antibiotics, use saline nasal sprays on a regular basis, use humidifier, if getting worse in the next 48 hours please notify us if not clearing up by Monday let us know

## 2017-09-18 NOTE — Telephone Encounter (Signed)
Patient states he thinks that his cold went from his chest to his head. Head stopped up and congested and states he is blowing nose so much his nose has started bleeding. He feels 50 % worse than he did when he was here on Monday. Not sure if running a fever as no thermometer. He feels flush,and not him self. Was given a steroid shot and was given omnicef 300 mg BID.

## 2017-09-18 NOTE — Progress Notes (Signed)
pred 

## 2017-09-18 NOTE — Telephone Encounter (Signed)
Discussed with pt. Pt verbalized understanding and med sent to pharm.

## 2017-09-27 ENCOUNTER — Other Ambulatory Visit: Payer: Self-pay | Admitting: Family Medicine

## 2017-09-30 ENCOUNTER — Other Ambulatory Visit: Payer: Self-pay | Admitting: *Deleted

## 2017-09-30 MED ORDER — ALBUTEROL SULFATE (2.5 MG/3ML) 0.083% IN NEBU: INHALATION_SOLUTION | RESPIRATORY_TRACT | 0 refills | Status: DC

## 2017-10-02 ENCOUNTER — Telehealth: Payer: Self-pay | Admitting: Family Medicine

## 2017-10-02 ENCOUNTER — Other Ambulatory Visit: Payer: Self-pay | Admitting: *Deleted

## 2017-10-02 MED ORDER — ALBUTEROL SULFATE (2.5 MG/3ML) 0.083% IN NEBU: 2.5000 mg | INHALATION_SOLUTION | RESPIRATORY_TRACT | 1 refills | Status: DC | PRN

## 2017-10-02 NOTE — Telephone Encounter (Signed)
Patient got a refill on vials for his nebulizer on 4/1 but only got 25 vials in of 75. He states not enough to cover him for them month.Can you send in for 50 more vials to cover him the rest of the month.His old prescription had 75 vials but didn't understand why he only got 25 this time. [facility] [place]

## 2017-10-02 NOTE — Telephone Encounter (Signed)
[place] per doctor. Med sent to pharm. Pt notified.

## 2017-12-11 ENCOUNTER — Inpatient Hospital Stay (HOSPITAL_COMMUNITY): Payer: PPO | Attending: Hematology

## 2017-12-11 DIAGNOSIS — Z85828 Personal history of other malignant neoplasm of skin: Secondary | ICD-10-CM | POA: Diagnosis not present

## 2017-12-11 DIAGNOSIS — Z7982 Long term (current) use of aspirin: Secondary | ICD-10-CM | POA: Diagnosis not present

## 2017-12-11 DIAGNOSIS — R918 Other nonspecific abnormal finding of lung field: Secondary | ICD-10-CM | POA: Diagnosis not present

## 2017-12-11 DIAGNOSIS — F17210 Nicotine dependence, cigarettes, uncomplicated: Secondary | ICD-10-CM | POA: Insufficient documentation

## 2017-12-11 DIAGNOSIS — Z720 Tobacco use: Secondary | ICD-10-CM

## 2017-12-11 DIAGNOSIS — I700 Atherosclerosis of aorta: Secondary | ICD-10-CM | POA: Insufficient documentation

## 2017-12-11 DIAGNOSIS — Z79899 Other long term (current) drug therapy: Secondary | ICD-10-CM | POA: Diagnosis not present

## 2017-12-11 DIAGNOSIS — I10 Essential (primary) hypertension: Secondary | ICD-10-CM | POA: Insufficient documentation

## 2017-12-11 DIAGNOSIS — K589 Irritable bowel syndrome without diarrhea: Secondary | ICD-10-CM | POA: Diagnosis not present

## 2017-12-11 DIAGNOSIS — D72828 Other elevated white blood cell count: Secondary | ICD-10-CM | POA: Diagnosis not present

## 2017-12-11 DIAGNOSIS — E785 Hyperlipidemia, unspecified: Secondary | ICD-10-CM | POA: Insufficient documentation

## 2017-12-11 DIAGNOSIS — D72829 Elevated white blood cell count, unspecified: Secondary | ICD-10-CM | POA: Insufficient documentation

## 2017-12-11 DIAGNOSIS — E781 Pure hyperglyceridemia: Secondary | ICD-10-CM | POA: Diagnosis not present

## 2017-12-11 DIAGNOSIS — J449 Chronic obstructive pulmonary disease, unspecified: Secondary | ICD-10-CM | POA: Diagnosis not present

## 2017-12-11 DIAGNOSIS — F419 Anxiety disorder, unspecified: Secondary | ICD-10-CM | POA: Diagnosis not present

## 2017-12-11 DIAGNOSIS — F411 Generalized anxiety disorder: Secondary | ICD-10-CM | POA: Diagnosis not present

## 2017-12-11 DIAGNOSIS — R911 Solitary pulmonary nodule: Secondary | ICD-10-CM | POA: Diagnosis not present

## 2017-12-11 LAB — COMPREHENSIVE METABOLIC PANEL
ALT: 10 U/L — ABNORMAL LOW (ref 17–63)
ANION GAP: 6 (ref 5–15)
AST: 15 U/L (ref 15–41)
Albumin: 4.1 g/dL (ref 3.5–5.0)
Alkaline Phosphatase: 63 U/L (ref 38–126)
BUN: 10 mg/dL (ref 6–20)
CHLORIDE: 102 mmol/L (ref 101–111)
CO2: 32 mmol/L (ref 22–32)
Calcium: 9.2 mg/dL (ref 8.9–10.3)
Creatinine, Ser: 0.81 mg/dL (ref 0.61–1.24)
GFR calc Af Amer: >60 mL/min (ref >60)
Glucose, Bld: 106 mg/dL — ABNORMAL HIGH (ref 65–99)
Potassium: 4.6 mmol/L (ref 3.5–5.1)
Sodium: 140 mmol/L (ref 135–145)
Total Bilirubin: 0.7 mg/dL (ref 0.3–1.2)
Total Protein: 7.2 g/dL (ref 6.5–8.1)

## 2017-12-11 LAB — CBC WITH DIFFERENTIAL/PLATELET
BASOS ABS: 0 10*3/uL (ref 0.0–0.1)
Basophils Relative: 0 %
EOS PCT: 8 %
Eosinophils Absolute: 0.9 10*3/uL — ABNORMAL HIGH (ref 0.0–0.7)
HCT: 51.6 % (ref 39.0–52.0)
Hemoglobin: 17.2 g/dL — ABNORMAL HIGH (ref 13.0–17.0)
LYMPHS ABS: 2.9 10*3/uL (ref 0.7–4.0)
LYMPHS PCT: 24 %
MCH: 31.1 pg (ref 26.0–34.0)
MCHC: 33.3 g/dL (ref 30.0–36.0)
MCV: 93.3 fL (ref 78.0–100.0)
MONO ABS: 1.2 10*3/uL — AB (ref 0.1–1.0)
Monocytes Relative: 10 %
Neutro Abs: 7.1 10*3/uL (ref 1.7–7.7)
Neutrophils Relative %: 59 %
PLATELETS: 369 10*3/uL (ref 150–400)
RBC: 5.53 MIL/uL (ref 4.22–5.81)
RDW: 14.3 % (ref 11.5–15.5)
WBC: 12.2 10*3/uL — ABNORMAL HIGH (ref 4.0–10.5)

## 2017-12-13 ENCOUNTER — Encounter (HOSPITAL_COMMUNITY): Payer: Self-pay | Admitting: Internal Medicine

## 2017-12-13 ENCOUNTER — Inpatient Hospital Stay (HOSPITAL_BASED_OUTPATIENT_CLINIC_OR_DEPARTMENT_OTHER): Payer: PPO | Admitting: Internal Medicine

## 2017-12-13 ENCOUNTER — Ambulatory Visit (HOSPITAL_COMMUNITY): Payer: PPO | Admitting: Hematology

## 2017-12-13 ENCOUNTER — Other Ambulatory Visit (HOSPITAL_COMMUNITY): Payer: PPO

## 2017-12-13 VITALS — BP 149/71 | HR 81 | Temp 97.9°F | Resp 20 | Wt 156.1 lb

## 2017-12-13 DIAGNOSIS — J449 Chronic obstructive pulmonary disease, unspecified: Secondary | ICD-10-CM

## 2017-12-13 DIAGNOSIS — K589 Irritable bowel syndrome without diarrhea: Secondary | ICD-10-CM | POA: Diagnosis not present

## 2017-12-13 DIAGNOSIS — R918 Other nonspecific abnormal finding of lung field: Secondary | ICD-10-CM | POA: Diagnosis not present

## 2017-12-13 DIAGNOSIS — I10 Essential (primary) hypertension: Secondary | ICD-10-CM

## 2017-12-13 DIAGNOSIS — E781 Pure hyperglyceridemia: Secondary | ICD-10-CM

## 2017-12-13 DIAGNOSIS — D72828 Other elevated white blood cell count: Secondary | ICD-10-CM | POA: Diagnosis not present

## 2017-12-13 DIAGNOSIS — Z85828 Personal history of other malignant neoplasm of skin: Secondary | ICD-10-CM

## 2017-12-13 DIAGNOSIS — F17210 Nicotine dependence, cigarettes, uncomplicated: Secondary | ICD-10-CM

## 2017-12-13 DIAGNOSIS — Z79899 Other long term (current) drug therapy: Secondary | ICD-10-CM

## 2017-12-13 DIAGNOSIS — I700 Atherosclerosis of aorta: Secondary | ICD-10-CM

## 2017-12-13 DIAGNOSIS — E785 Hyperlipidemia, unspecified: Secondary | ICD-10-CM | POA: Diagnosis not present

## 2017-12-13 DIAGNOSIS — R911 Solitary pulmonary nodule: Secondary | ICD-10-CM | POA: Diagnosis not present

## 2017-12-13 DIAGNOSIS — F419 Anxiety disorder, unspecified: Secondary | ICD-10-CM

## 2017-12-13 DIAGNOSIS — D72829 Elevated white blood cell count, unspecified: Secondary | ICD-10-CM

## 2017-12-13 DIAGNOSIS — F411 Generalized anxiety disorder: Secondary | ICD-10-CM | POA: Diagnosis not present

## 2017-12-13 DIAGNOSIS — Z7982 Long term (current) use of aspirin: Secondary | ICD-10-CM

## 2017-12-13 NOTE — Progress Notes (Signed)
Diagnosis Leukocytosis, unspecified type  Other elevated white blood cell (WBC) count - Plan: CBC with Differential/Platelet, Comprehensive metabolic panel, Lactate dehydrogenase  Staging Cancer Staging No matching staging information was found for the patient.  Assessment and Plan:  1.  Leucocytosis and thrombocytosis.  Labs done 12/11/2017 reviewed with pt and showed WBC 12.2, HB 17.2 and plts 369,000.  He has  Had extensive evaluation with JAK 2 and BCR/ABL negative.  I discussed with him that WBC is minimally elevated at 12,000 and is likely due to smoking.  He will RTC in 1 year for  Follow-up and repeat labs.    2  Smoking.  Cessation recommended.  He is recommended to continue yearly CT scan for ongoing lung nodule follow-up with PCP.    3.  Pulmonary nodule.  This was noted on CT scan neck done 12/28/2016.  He should continue yearly CT scan for ongoing follow-up.    4. HTN.  BP is noted to be 148/71.  Follow-up with PCP.     Current Status:  Pt is seen today for follow-up to go over labs.  He continues to smoke.     Problem List Patient Active Problem List   Diagnosis Date Noted  . Aortic atherosclerosis ([place]) [I70.0] 12/30/2016  . COPD exacerbation ([place]) [J44.1] 07/04/2016  . Insomnia [G47.00] 02/16/2016  . Hyperlipidemia [E78.5] 04/18/2015  . Solitary pulmonary nodule [R91.1] 11/09/2014  . HTN (hypertension) [I10] 12/17/2013  . Leukocytosis [D72.829] 12/22/2012  . Other malaise and fatigue [R53.81, R53.83] 12/04/2012  . Generalized anxiety disorder [F41.1] 12/27/2009  . IBS [K58.9] 12/27/2009  . BLOOD IN STOOL, OCCULT [R19.5] 12/27/2009  . HYPERTRIGLYCERIDEMIA [E78.1] 12/26/2009  . COPD mixed type ([place]) [J44.9] 12/26/2009  . ABDOMINAL PAIN, LEFT LOWER QUADRANT [R10.32] 12/26/2009    Past Medical History Past Medical History:  Diagnosis Date  . Anxiety   . COPD (chronic obstructive pulmonary disease) ([place])   . Glaucoma    both eyes  . Hyperlipidemia   .  Pre-diabetes   . Skin cancer, basal cell     Past Surgical History Past Surgical History:  Procedure Laterality Date  . CARDIAC CATHETERIZATION N/A 12/2005   negative  . COLONOSCOPY N/A 12/2009   small polyp repeat in 10 years  . HAND SURGERY Right 2010   dupuytrens excision right little finger    Family History Family History  Problem Relation Age of Onset  . Heart attack Father   . Heart attack Mother   . Lung cancer Brother      Social History  reports that he has been smoking cigarettes.  He has a 120.00 pack-year smoking history. He has never used smokeless tobacco. He reports that he does not drink alcohol or use drugs.  Medications  Current Outpatient Medications:  .  albuterol (PROVENTIL) (2.5 MG/3ML) 0.083% nebulizer solution, Take 3 mLs (2.5 mg total) by nebulization every 4 (four) hours as needed for wheezing., Disp: 75 vial, Rfl: 1 .  albuterol (VENTOLIN HFA) 108 (90 Base) MCG/ACT inhaler, INHALE TWO PUFFS BY MOUTH EVERY 4 HOURS AS NEEDED, Disp: 18 each, Rfl: 5 .  ALPRAZolam (XANAX) 0.5 MG tablet, Take 1 tablet (0.5 mg total) by mouth at bedtime as needed for anxiety., Disp: 30 tablet, Rfl: 3 .  aspirin 81 MG tablet, Take 81 mg by mouth daily., Disp: , Rfl:  .  budesonide (PULMICORT) 0.5 MG/2ML nebulizer solution, Take 0.5 mg by nebulization 2 (two) times daily., Disp: , Rfl:  .  budesonide-formoterol (SYMBICORT) 160-4.5  MCG/ACT inhaler, INHALE TWO PUFFS BY MOUTH TWICE DAILY ** STOP SPIRIVA **, Disp: 3 Inhaler, Rfl: 5 .  cefdinir (OMNICEF) 300 MG capsule, Take 1 capsule (300 mg total) by mouth 2 (two) times daily., Disp: 20 capsule, Rfl: 0 .  chlorzoxazone (PARAFON FORTE DSC) 500 MG tablet, Take 1 tablet (500 mg total) by mouth 4 (four) times daily as needed for muscle spasms., Disp: 30 tablet, Rfl: 0 .  dorzolamide-timolol (COSOPT) 22.3-6.8 MG/ML ophthalmic solution, Place 1 drop into both eyes 2 (two) times daily. , Disp: , Rfl:  .  ibuprofen (ADVIL,MOTRIN) 200 MG  tablet, Take 400 mg by mouth every 6 (six) hours as needed for headache., Disp: , Rfl:  .  latanoprost (XALATAN) 0.005 % ophthalmic solution, Place 1 drop into both eyes at bedtime. , Disp: , Rfl:  .  lisinopril (PRINIVIL,ZESTRIL) 5 MG tablet, Take 1 tablet (5 mg total) by mouth daily., Disp: 30 tablet, Rfl: 0 .  lisinopril (PRINIVIL,ZESTRIL) 5 MG tablet, TAKE 1 TABLET BY MOUTH ONCE DAILY, Disp: 90 tablet, Rfl: 0 .  nitroGLYCERIN (NITROSTAT) 0.4 MG SL tablet, 1 sl q 5 min prn chest pressure as directed, Disp: 50 tablet, Rfl: 3 .  pravastatin (PRAVACHOL) 40 MG tablet, Take 1 tablet (40 mg total) by mouth daily., Disp: 90 tablet, Rfl: 1 .  predniSONE (DELTASONE) 20 MG tablet, Take 2 qd for 5 days, Disp: 10 tablet, Rfl: 0 .  SAW PALMETTO, SERENOA REPENS, PO, Take 1 capsule by mouth daily. , Disp: , Rfl:   Allergies Amlodipine  Review of Systems Review of Systems - Oncology ROS as per HPI otherwise 12 point ROS is negative.   Physical Exam  Vitals Wt Readings from Last 3 Encounters:  12/13/17 156 lb 1.6 oz (70.8 kg)  09/16/17 153 lb 3.2 oz (69.5 kg)  08/05/17 149 lb (67.6 kg)   Temp Readings from Last 3 Encounters:  12/13/17 97.9 F (36.6 C) (Oral)  09/16/17 98.4 F (36.9 C) (Oral)  12/17/16 98.5 F (36.9 C) (Oral)   BP Readings from Last 3 Encounters:  12/13/17 (!) 149/71  09/16/17 132/68  08/05/17 134/84   Pulse Readings from Last 3 Encounters:  12/13/17 81  12/11/16 73  04/23/16 79    Constitutional: Well-developed, well-nourished, and in no distress.   HENT: Head: Normocephalic and atraumatic.  Mouth/Throat: No oropharyngeal exudate. Mucosa moist. Eyes: Pupils are equal, round, and reactive to light. Conjunctivae are normal. No scleral icterus.  Neck: Normal range of motion. Neck supple. No JVD present.  Cardiovascular: Normal rate, regular rhythm and normal heart sounds.  Exam reveals no gallop and no friction rub.   No murmur heard. Pulmonary/Chest: Effort normal.   Coarse BS.   Abdominal: Soft. Bowel sounds are normal. No distension. There is no tenderness. There is no guarding.  Musculoskeletal: No edema or tenderness.  Lymphadenopathy: No cervical, axillary or supraclavicular adenopathy.  Neurological: Alert and oriented to person, place, and time. No cranial nerve deficit.  Skin: Skin is warm and dry. No rash noted. No erythema. No pallor.  Psychiatric: Affect and judgment normal.   Labs Appointment on 12/11/2017  Component Date Value Ref Range Status  . WBC 12/11/2017 12.2* 4.0 - 10.5 K/uL Final  . RBC 12/11/2017 5.53  4.22 - 5.81 MIL/uL Final  . Hemoglobin 12/11/2017 17.2* 13.0 - 17.0 g/dL Final  . HCT 12/11/2017 51.6  39.0 - 52.0 % Final  . MCV 12/11/2017 93.3  78.0 - 100.0 fL Final  . [facility] 12/11/2017 31.1  26.0 - 34.0 pg Final  . MCHC 12/11/2017 33.3  30.0 - 36.0 g/dL Final  . RDW 12/11/2017 14.3  11.5 - 15.5 % Final  . Platelets 12/11/2017 369  150 - 400 K/uL Final  . Neutrophils Relative % 12/11/2017 59  % Final  . Neutro Abs 12/11/2017 7.1  1.7 - 7.7 K/uL Final  . Lymphocytes Relative 12/11/2017 24  % Final  . Lymphs Abs 12/11/2017 2.9  0.7 - 4.0 K/uL Final  . Monocytes Relative 12/11/2017 10  % Final  . Monocytes Absolute 12/11/2017 1.2* 0.1 - 1.0 K/uL Final  . Eosinophils Relative 12/11/2017 8  % Final  . Eosinophils Absolute 12/11/2017 0.9* 0.0 - 0.7 K/uL Final  . Basophils Relative 12/11/2017 0  % Final  . Basophils Absolute 12/11/2017 0.0  0.0 - 0.1 K/uL Final   Performed at [facility], [address] [place], [place] [address]  . Sodium 12/11/2017 140  135 - 145 mmol/L Final  . Potassium 12/11/2017 4.6  3.5 - 5.1 mmol/L Final  . Chloride 12/11/2017 102  101 - 111 mmol/L Final  . CO2 12/11/2017 32  22 - 32 mmol/L Final  . Glucose, Bld 12/11/2017 106* 65 - 99 mg/dL Final  . BUN 12/11/2017 10  6 - 20 mg/dL Final  . Creatinine, Ser 12/11/2017 0.81  0.61 - 1.24 mg/dL Final  . Calcium 12/11/2017 9.2  8.9 - 10.3 mg/dL Final  .  Total Protein 12/11/2017 7.2  6.5 - 8.1 g/dL Final  . Albumin 12/11/2017 4.1  3.5 - 5.0 g/dL Final  . AST 12/11/2017 15  15 - 41 U/L Final  . ALT 12/11/2017 10* 17 - 63 U/L Final  . Alkaline Phosphatase 12/11/2017 63  38 - 126 U/L Final  . Total Bilirubin 12/11/2017 0.7  0.3 - 1.2 mg/dL Final  . GFR calc non Af Amer 12/11/2017 >60  >60 mL/min Final  . GFR calc Af Amer 12/11/2017 >60  >60 mL/min Final   Comment: (NOTE) The eGFR has been calculated using the CKD EPI equation. This calculation has not been validated in all clinical situations. eGFR's persistently <60 mL/min signify possible Chronic Kidney Disease.   Georgiann Hahn gap 12/11/2017 6  5 - 15 Final   Performed at [facility], [address] [place], [place] [address]     Pathology Orders Placed This Encounter  Procedures  . CBC with Differential/Platelet    Standing Status:   Future    Standing Expiration Date:   12/14/2019  . Comprehensive metabolic panel    Standing Status:   Future    Standing Expiration Date:   12/14/2019  . Lactate dehydrogenase    Standing Status:   Future    Standing Expiration Date:   12/14/2019       Zoila Shutter MD

## 2017-12-13 NOTE — Patient Instructions (Signed)
[facility] Discharge Instructions  You saw Dr. Higgs today.   Thank you for choosing [facility] at [facility] to provide your oncology and hematology care.  To afford each patient quality time with our provider, please arrive at least 15 minutes before your scheduled appointment time.   If you have a lab appointment with the Cancer Center please come in thru the  Main Entrance and check in at the main information desk  You need to re-schedule your appointment should you arrive 10 or more minutes late.  We strive to give you quality time with our providers, and arriving late affects you and other patients whose appointments are after yours.  Also, if you no show three or more times for appointments you may be dismissed from the clinic at the providers discretion.     Again, thank you for choosing [facility].  Our hope is that these requests will decrease the amount of time that you wait before being seen by our physicians.       _____________________________________________________________  Should you have questions after your visit to [facility], please contact our office at [phone] between the hours of 8:30 a.m. and 4:30 p.m.  Voicemails left after 4:30 p.m. will not be returned until the following business day.  For prescription refill requests, have your pharmacy contact our office.       Resources For Cancer Patients and their Caregivers ? American Cancer Society: Can assist with transportation, wigs, general needs, runs Look Good Feel Better.        1-888-227-6333 ? Cancer Care: Provides financial assistance, online support groups, medication/co-pay assistance.  1-800-813-HOPE (4673) ? [facility] Assists [place] cancer patients and their families through emotional , educational and financial support.  336-427-4357 ? [place] DSS Where to apply for food  stamps, Medicaid and utility assistance. 336-342-1394 ? [facility]: Transportation to medical appointments. 336-347-2287 ? Social Security Administration: May apply for disability if have a Stage IV cancer. 336-342-7796 1-800-772-1213 ? [place] Aging, Disability and Transit Services: Assists with nutrition, care and transit needs. 336-349-2343  Cancer Center Support Programs:   > Cancer Support Group  2nd Tuesday of the month 1pm-2pm, Journey Room   > Creative Journey  3rd Tuesday of the month 1130am-1pm, Journey Room     

## 2017-12-23 ENCOUNTER — Other Ambulatory Visit: Payer: Self-pay | Admitting: Family Medicine

## 2017-12-26 ENCOUNTER — Encounter: Payer: Self-pay | Admitting: Family Medicine

## 2017-12-26 ENCOUNTER — Ambulatory Visit (INDEPENDENT_AMBULATORY_CARE_PROVIDER_SITE_OTHER): Payer: PPO | Admitting: Family Medicine

## 2017-12-26 VITALS — BP 132/90 | Ht 68.0 in | Wt 153.0 lb

## 2017-12-26 DIAGNOSIS — D72820 Lymphocytosis (symptomatic): Secondary | ICD-10-CM | POA: Diagnosis not present

## 2017-12-26 DIAGNOSIS — R911 Solitary pulmonary nodule: Secondary | ICD-10-CM

## 2017-12-26 DIAGNOSIS — J449 Chronic obstructive pulmonary disease, unspecified: Secondary | ICD-10-CM

## 2017-12-26 DIAGNOSIS — E782 Mixed hyperlipidemia: Secondary | ICD-10-CM

## 2017-12-26 DIAGNOSIS — I10 Essential (primary) hypertension: Secondary | ICD-10-CM | POA: Diagnosis not present

## 2017-12-26 DIAGNOSIS — F5101 Primary insomnia: Secondary | ICD-10-CM | POA: Diagnosis not present

## 2017-12-26 MED ORDER — LISINOPRIL 5 MG PO TABS: 5.0000 mg | ORAL_TABLET | Freq: Every day | ORAL | 1 refills | Status: DC

## 2017-12-26 MED ORDER — ALPRAZOLAM 0.5 MG PO TABS: 0.5000 mg | ORAL_TABLET | Freq: Every evening | ORAL | 5 refills | Status: DC | PRN

## 2017-12-26 MED ORDER — PRAVASTATIN SODIUM 40 MG PO TABS: 40.0000 mg | ORAL_TABLET | Freq: Every day | ORAL | 1 refills | Status: DC

## 2017-12-26 MED ORDER — ALBUTEROL SULFATE (2.5 MG/3ML) 0.083% IN NEBU: INHALATION_SOLUTION | RESPIRATORY_TRACT | 6 refills | Status: DC

## 2017-12-26 MED ORDER — ALBUTEROL SULFATE HFA 108 (90 BASE) MCG/ACT IN AERS: INHALATION_SPRAY | RESPIRATORY_TRACT | 3 refills | Status: DC

## 2017-12-26 MED ORDER — BUDESONIDE-FORMOTEROL FUMARATE 160-4.5 MCG/ACT IN AERO: INHALATION_SPRAY | RESPIRATORY_TRACT | 3 refills | Status: DC

## 2017-12-26 MED ORDER — PREDNISONE 20 MG PO TABS: ORAL_TABLET | ORAL | 0 refills | Status: DC

## 2017-12-26 NOTE — Progress Notes (Signed)
Subjective:    Patient ID: George Meyer, male    DOB: [birth year], 77 y.o.   MRN: [id number]  Hypertension  This is a chronic problem. Pertinent negatives include no chest pain, headaches or shortness of breath. Compliance problems include exercise (takes meds every day, eats healthy).    Concerns about the quantity of abluterol solution he gets from pharm.  We had to reorder the medication because he was not getting it he uses his albuterol 3-4 times a day if not more when he is having flareups but other times does not use as much  Uses Xanax at nighttime is had insomnia for a long time does help with sleep denies excessive drowsiness  Patient still smokes needs and knows he needs to quit working on doing so he is cutting back  Patient has pulmonary nodule it was benign but it is recommended to do a follow-up one again in September we will help set this up  Patient for blood pressure check up.  The patient does have hypertension.  The patient is on medication.  Patient relates compliance with meds. Todays BP reviewed with the patient. Patient denies issues with medication. Patient relates reasonable diet. Patient tries to minimize salt. Patient aware of BP goals.  Patient here for follow-up regarding cholesterol.  The patient does have hyperlipidemia.  Patient does try to maintain a reasonable diet.  Patient does take the medication on a regular basis.  Denies missing a dose.  The patient denies any obvious side effects.  Prior blood work results reviewed with the patient.  The patient is aware of his cholesterol goals and the need to keep it under good control to lessen the risk of disease.      Review of Systems  Constitutional: Negative for activity change, appetite change and fatigue.  HENT: Negative for congestion and rhinorrhea.   Respiratory: Negative for cough, chest tightness and shortness of breath.   Cardiovascular: Negative for chest pain and leg swelling.    Gastrointestinal: Negative for abdominal pain, diarrhea and nausea.  Endocrine: Negative for polydipsia and polyphagia.  Genitourinary: Negative for dysuria and hematuria.  Neurological: Negative for weakness and headaches.  Psychiatric/Behavioral: Negative for confusion and dysphoric mood.       Objective:   Physical Exam  Constitutional: He appears well-nourished. No distress.  HENT:  Head: Normocephalic and atraumatic.  Eyes: Right eye exhibits no discharge. Left eye exhibits no discharge.  Cardiovascular: Normal rate, regular rhythm and normal heart sounds.  No murmur heard. Pulmonary/Chest: Effort normal and breath sounds normal. No respiratory distress.  Musculoskeletal: He exhibits no edema.  Lymphadenopathy:    He has no cervical adenopathy.  Neurological: He is alert.  Psychiatric: His behavior is normal.  Vitals reviewed.         Assessment & Plan:  Pulmonary nodule follow-up CAT scan in September COPD stable continue current medications Blood pressure good control continue current measures Xanax for insomnia at nighttime continue current measures Leukocytosis stable Patient at high risk of stroke continue 81 mg aspirin 25 minutes was spent with the patient.  This statement verifies that 25 minutes was indeed spent with the patient.  More than 50% of this visit-total duration of the visit-was spent in counseling and coordination of care. The issues that the patient came in for today as reflected in the diagnosis (s) please refer to documentation for further details. Significant time spent with patient rechecking his blood pressure discussing blood pressure discussing COPD and his pulmonary  nodule and follow-up for CAT scan

## 2018-02-10 ENCOUNTER — Other Ambulatory Visit: Payer: Self-pay | Admitting: Family Medicine

## 2018-02-11 ENCOUNTER — Encounter: Payer: Self-pay | Admitting: Family Medicine

## 2018-02-11 ENCOUNTER — Ambulatory Visit (INDEPENDENT_AMBULATORY_CARE_PROVIDER_SITE_OTHER): Payer: PPO | Admitting: Family Medicine

## 2018-02-11 VITALS — BP 132/80 | Temp 99.0°F | Ht 68.0 in | Wt 152.6 lb

## 2018-02-11 DIAGNOSIS — J441 Chronic obstructive pulmonary disease with (acute) exacerbation: Secondary | ICD-10-CM

## 2018-02-11 MED ORDER — AMOXICILLIN-POT CLAVULANATE 875-125 MG PO TABS: 1.0000 | ORAL_TABLET | Freq: Two times a day (BID) | ORAL | 0 refills | Status: DC

## 2018-02-11 MED ORDER — METHYLPREDNISOLONE ACETATE 40 MG/ML IJ SUSP
40.0000 mg | Freq: Once | INTRAMUSCULAR | Status: AC
Start: 2018-02-11 — End: 2018-02-11
  Administered 2018-02-11: 40 mg via INTRAMUSCULAR

## 2018-02-11 NOTE — Progress Notes (Signed)
   Subjective:    Patient ID: George Meyer, male    DOB: 07/25/1940, 77 y.o.   MRN: [id number]   URI    This is a new problem. The current episode started 1 to 4 weeks ago. Associated symptoms include congestion, coughing, headaches, rhinorrhea and wheezing. Pertinent negatives include no chest pain, ear pain, nausea or vomiting. Treatments tried: steroids.  The treatment provided mild relief.  Progressive congestion coughing bringing up discolored phlegm denies high fever chills sweats does relate some wheezing and shortness of breath.  His history of a history of COPD Review of Systems  Constitutional: Negative for activity change, chills and fever.  HENT: Positive for congestion and rhinorrhea. Negative for ear pain.   Eyes: Negative for discharge.  Respiratory: Positive for cough and wheezing.   Cardiovascular: Negative for chest pain.  Gastrointestinal: Negative for nausea and vomiting.  Musculoskeletal: Negative for arthralgias.  Neurological: Positive for headaches.       Objective:   Physical Exam  Constitutional: He appears well-developed.  HENT:  Head: Normocephalic.  Mouth/Throat: Oropharynx is clear and moist. No oropharyngeal exudate.  Neck: Normal range of motion.  Cardiovascular: Normal rate, regular rhythm and normal heart sounds.  No murmur heard. Pulmonary/Chest: Effort normal and breath sounds normal. He has no wheezes.  Lymphadenopathy:    He has no cervical adenopathy.  Neurological: He exhibits normal muscle tone.  Skin: Skin is warm and dry.  Nursing note and vitals reviewed.    Warning signs was discussed     Assessment & Plan:  COPD exacerbation Continue prednisone Add Depo-Medrol shot In addition to this add antibiotics If progressive troubles or worse follow-up

## 2018-02-13 ENCOUNTER — Encounter: Payer: Self-pay | Admitting: Family Medicine

## 2018-02-13 ENCOUNTER — Ambulatory Visit (INDEPENDENT_AMBULATORY_CARE_PROVIDER_SITE_OTHER): Payer: PPO | Admitting: Family Medicine

## 2018-02-13 VITALS — BP 138/84 | Ht 68.0 in | Wt 150.6 lb

## 2018-02-13 DIAGNOSIS — J441 Chronic obstructive pulmonary disease with (acute) exacerbation: Secondary | ICD-10-CM | POA: Diagnosis not present

## 2018-02-13 DIAGNOSIS — I10 Essential (primary) hypertension: Secondary | ICD-10-CM

## 2018-02-13 DIAGNOSIS — E782 Mixed hyperlipidemia: Secondary | ICD-10-CM | POA: Diagnosis not present

## 2018-02-13 NOTE — Progress Notes (Signed)
Subjective:    Patient ID: George Meyer, male    DOB: 02-Feb-1941, 77 y.o.   MRN: [id number]  Hypertension  This is a chronic problem. The current episode started more than 1 month ago. The problem has been gradually improving since onset. The problem is controlled. Pertinent negatives include no anxiety, chest pain, headaches, palpitations or shortness of breath. There are no associated agents to hypertension. Past treatments include ACE inhibitors. The current treatment provides significant improvement. There are no compliance problems.  There is no history of kidney disease or CAD/MI. There is no history of chronic renal disease.  Hyperlipidemia  This is a chronic problem. The current episode started more than 1 year ago. The problem is controlled. Recent lipid tests were reviewed and are normal. He has no history of chronic renal disease or liver disease. There are no known factors aggravating his hyperlipidemia. Pertinent negatives include no chest pain or shortness of breath. Current antihyperlipidemic treatment includes statins. The current treatment provides significant improvement of lipids. There are no compliance problems.  Risk factors for coronary artery disease include hypertension and male sex.   PT here for 6 month follow up.    Review of Systems  Constitutional: Negative for diaphoresis and fatigue.  HENT: Negative for congestion and rhinorrhea.   Respiratory: Negative for cough and shortness of breath.   Cardiovascular: Negative for chest pain, palpitations and leg swelling.  Gastrointestinal: Negative for abdominal pain and diarrhea.  Skin: Negative for color change and rash.  Neurological: Negative for dizziness and headaches.  Psychiatric/Behavioral: Negative for behavioral problems and confusion.       Objective:   Physical Exam  Constitutional: He appears well-nourished. No distress.  HENT:  Head: Normocephalic and atraumatic.  Eyes: Right eye exhibits no  discharge. Left eye exhibits no discharge.  Neck: No tracheal deviation present.  Cardiovascular: Normal rate, regular rhythm and normal heart sounds.  No murmur heard. Pulmonary/Chest: Effort normal. No respiratory distress. He has wheezes.  Musculoskeletal: He exhibits no edema.  Lymphadenopathy:    He has no cervical adenopathy.  Neurological: He is alert. Coordination normal.  Skin: Skin is warm and dry.  Psychiatric: He has a normal mood and affect. His behavior is normal.  Vitals reviewed.  No respiratory distress       Assessment & Plan:  HTN- Patient was seen today as part of a visit regarding hypertension. The importance of healthy diet and regular physical activity was discussed. The importance of compliance with medications discussed.  Ideal goal is to keep blood pressure low elevated levels certainly below 409/81 when possible.  The patient was counseled that keeping blood pressure under control lessen his risk of complications.  The importance of regular follow-ups was discussed with the patient.  Low-salt diet such as DASH recommended.  Regular physical activity was recommended as well.  Patient was advised to keep regular follow-ups.  The patient was seen today as part of an evaluation regarding hyperlipidemia.  Recent lab work has been reviewed with the patient as well as the goals for good cholesterol care.  In addition to this medications have been discussed the importance of compliance with diet and medications discussed as well.  Finally the patient is aware that poor control of cholesterol, noncompliance can dramatically increase the risk of complications. The patient will keep regular office visits and the patient does agreed to periodic lab work. COPD exacerbation actually doing some better he will call us if not improving see previous notes  as well

## 2018-03-06 ENCOUNTER — Other Ambulatory Visit: Payer: Self-pay | Admitting: *Deleted

## 2018-03-06 DIAGNOSIS — R911 Solitary pulmonary nodule: Secondary | ICD-10-CM

## 2018-03-07 ENCOUNTER — Encounter: Payer: Self-pay | Admitting: Family Medicine

## 2018-03-25 ENCOUNTER — Ambulatory Visit (HOSPITAL_COMMUNITY)
Admission: RE | Admit: 2018-03-25 | Discharge: 2018-03-25 | Disposition: A | Discharge status: Home / Self Care | Payer: PPO | Source: Ambulatory Visit | Attending: Family Medicine | Admitting: Family Medicine

## 2018-03-25 DIAGNOSIS — I2510 Atherosclerotic heart disease of native coronary artery without angina pectoris: Secondary | ICD-10-CM | POA: Insufficient documentation

## 2018-03-25 DIAGNOSIS — Z122 Encounter for screening for malignant neoplasm of respiratory organs: Secondary | ICD-10-CM | POA: Insufficient documentation

## 2018-03-25 DIAGNOSIS — R911 Solitary pulmonary nodule: Secondary | ICD-10-CM | POA: Diagnosis not present

## 2018-03-25 DIAGNOSIS — J439 Emphysema, unspecified: Secondary | ICD-10-CM | POA: Diagnosis not present

## 2018-03-25 DIAGNOSIS — J432 Centrilobular emphysema: Secondary | ICD-10-CM | POA: Insufficient documentation

## 2018-03-25 DIAGNOSIS — I700 Atherosclerosis of aorta: Secondary | ICD-10-CM | POA: Insufficient documentation

## 2018-03-27 DIAGNOSIS — L570 Actinic keratosis: Secondary | ICD-10-CM | POA: Diagnosis not present

## 2018-03-27 DIAGNOSIS — Z85820 Personal history of malignant melanoma of skin: Secondary | ICD-10-CM | POA: Diagnosis not present

## 2018-03-27 DIAGNOSIS — Z08 Encounter for follow-up examination after completed treatment for malignant neoplasm: Secondary | ICD-10-CM | POA: Diagnosis not present

## 2018-03-27 DIAGNOSIS — X32XXXD Exposure to sunlight, subsequent encounter: Secondary | ICD-10-CM | POA: Diagnosis not present

## 2018-03-27 DIAGNOSIS — B078 Other viral warts: Secondary | ICD-10-CM | POA: Diagnosis not present

## 2018-04-04 ENCOUNTER — Ambulatory Visit (INDEPENDENT_AMBULATORY_CARE_PROVIDER_SITE_OTHER): Payer: PPO

## 2018-04-04 DIAGNOSIS — H401131 Primary open-angle glaucoma, bilateral, mild stage: Secondary | ICD-10-CM | POA: Diagnosis not present

## 2018-04-04 DIAGNOSIS — Z23 Encounter for immunization: Secondary | ICD-10-CM

## 2018-04-04 DIAGNOSIS — H35372 Puckering of macula, left eye: Secondary | ICD-10-CM | POA: Diagnosis not present

## 2018-05-26 ENCOUNTER — Other Ambulatory Visit: Payer: Self-pay | Admitting: Family Medicine

## 2018-07-16 ENCOUNTER — Other Ambulatory Visit: Payer: Self-pay | Admitting: Family Medicine

## 2018-07-20 NOTE — Telephone Encounter (Signed)
May have this +3 refills 

## 2018-07-21 ENCOUNTER — Ambulatory Visit (INDEPENDENT_AMBULATORY_CARE_PROVIDER_SITE_OTHER): Payer: PPO | Admitting: Family Medicine

## 2018-07-21 ENCOUNTER — Encounter: Payer: Self-pay | Admitting: Family Medicine

## 2018-07-21 VITALS — BP 116/70 | Ht 68.0 in | Wt 144.0 lb

## 2018-07-21 DIAGNOSIS — R634 Abnormal weight loss: Secondary | ICD-10-CM

## 2018-07-21 DIAGNOSIS — I10 Essential (primary) hypertension: Secondary | ICD-10-CM

## 2018-07-21 DIAGNOSIS — D72820 Lymphocytosis (symptomatic): Secondary | ICD-10-CM | POA: Diagnosis not present

## 2018-07-21 DIAGNOSIS — J449 Chronic obstructive pulmonary disease, unspecified: Secondary | ICD-10-CM

## 2018-07-21 MED ORDER — ALBUTEROL SULFATE HFA 108 (90 BASE) MCG/ACT IN AERS: INHALATION_SPRAY | RESPIRATORY_TRACT | 3 refills | Status: DC

## 2018-07-21 MED ORDER — ALPRAZOLAM 0.5 MG PO TABS: 0.5000 mg | ORAL_TABLET | Freq: Every evening | ORAL | 5 refills | Status: DC | PRN

## 2018-07-21 MED ORDER — BUDESONIDE-FORMOTEROL FUMARATE 160-4.5 MCG/ACT IN AERO: INHALATION_SPRAY | RESPIRATORY_TRACT | 3 refills | Status: DC

## 2018-07-21 NOTE — Progress Notes (Signed)
   Subjective:    Patient ID: George Meyer, male    DOB: 01-03-1941, 78 y.o.   MRN: [id number]  Hypertension  This is a chronic problem. Pertinent negatives include no chest pain, headaches or shortness of breath. Compliance problems include diet and exercise.    Concerns about weight loss. Pt states he has lost 28 lbs in the past year. Does not have an appetitie.   I reviewed over his weights over the past 2 years.  The highest his weight was was 166 more commonly he was right around 1 58-1 52 He relates he just does not have much appetite he does eat a moderate breakfast does not eat much for lunch and needs a fair supper He denies rectal bleeding hematuria denies chest pressure tightness Has underlying condition of COPD Also has underlying condition of leukocytosis which is been felt to be benign and being monitored Also has yearly CT scan of the lungs which so far have not shown any cancer  Review of Systems  Constitutional: Positive for fatigue and unexpected weight change. Negative for diaphoresis and fever.  HENT: Negative for congestion and rhinorrhea.   Respiratory: Negative for cough and shortness of breath.   Cardiovascular: Negative for chest pain and leg swelling.  Gastrointestinal: Negative for abdominal pain and diarrhea.  Skin: Negative for color change and rash.  Neurological: Negative for dizziness and headaches.  Psychiatric/Behavioral: Negative for behavioral problems and confusion.       Objective:   Physical Exam Vitals signs reviewed.  Constitutional:      General: He is not in acute distress. HENT:     Head: Normocephalic and atraumatic.  Eyes:     General:        Right eye: No discharge.        Left eye: No discharge.  Neck:     Trachea: No tracheal deviation.  Cardiovascular:     Rate and Rhythm: Normal rate and regular rhythm.     Heart sounds: Normal heart sounds. No murmur.  Pulmonary:     Effort: Pulmonary effort is normal. No respiratory  distress.     Breath sounds: Normal breath sounds.  Lymphadenopathy:     Cervical: No cervical adenopathy.  Skin:    General: Skin is warm and dry.  Neurological:     Mental Status: He is alert.     Coordination: Coordination normal.  Psychiatric:        Behavior: Behavior normal.           Assessment & Plan:  Weight loss unintentional Needs to improve calorie intake despite not feeling like he wants to eat much Very important for the patient to weigh himself on a weekly basis and to send [place] updates every 4 to 6 weeks Follow-up within 6 to 8 weeks We will do a chest x-ray to look at this we will also do lab work  Blood pressure good control continue current measures minimize salt  Xanax benefits from this at nighttime this prescription was sent in  History of lymphocytosis repeat CBC to look at white blood count may need further intervention depending on this may need other testing  COPD patient continues his inhalers prescriptions were given we will do a chest x-ray.  Follow-up sooner if any problems

## 2018-07-24 ENCOUNTER — Ambulatory Visit (HOSPITAL_COMMUNITY)
Admission: RE | Admit: 2018-07-24 | Discharge: 2018-07-24 | Disposition: A | Discharge status: Home / Self Care | Payer: PPO | Source: Ambulatory Visit | Attending: Family Medicine | Admitting: Family Medicine

## 2018-07-24 ENCOUNTER — Other Ambulatory Visit (HOSPITAL_COMMUNITY)
Admission: RE | Admit: 2018-07-24 | Discharge: 2018-07-24 | Disposition: A | Discharge status: Home / Self Care | Payer: PPO | Source: Ambulatory Visit | Attending: Family Medicine | Admitting: Family Medicine

## 2018-07-24 DIAGNOSIS — R634 Abnormal weight loss: Secondary | ICD-10-CM

## 2018-07-24 DIAGNOSIS — D72820 Lymphocytosis (symptomatic): Secondary | ICD-10-CM | POA: Diagnosis not present

## 2018-07-24 DIAGNOSIS — I10 Essential (primary) hypertension: Secondary | ICD-10-CM

## 2018-07-24 DIAGNOSIS — J449 Chronic obstructive pulmonary disease, unspecified: Secondary | ICD-10-CM | POA: Insufficient documentation

## 2018-07-24 DIAGNOSIS — R05 Cough: Secondary | ICD-10-CM | POA: Diagnosis not present

## 2018-07-24 LAB — CBC WITH DIFFERENTIAL/PLATELET
ABS IMMATURE GRANULOCYTES: 0.05 10*3/uL (ref 0.00–0.07)
Basophils Absolute: 0.1 10*3/uL (ref 0.0–0.1)
Basophils Relative: 1 %
Eosinophils Absolute: 1 10*3/uL — ABNORMAL HIGH (ref 0.0–0.5)
Eosinophils Relative: 8 %
HCT: 52 % (ref 39.0–52.0)
Hemoglobin: 16.8 g/dL (ref 13.0–17.0)
Immature Granulocytes: 0 %
Lymphocytes Relative: 24 %
Lymphs Abs: 3.2 10*3/uL (ref 0.7–4.0)
MCH: 30.7 pg (ref 26.0–34.0)
MCHC: 32.3 g/dL (ref 30.0–36.0)
MCV: 95.1 fL (ref 80.0–100.0)
Monocytes Absolute: 1 10*3/uL (ref 0.1–1.0)
Monocytes Relative: 8 %
NEUTROS ABS: 7.6 10*3/uL (ref 1.7–7.7)
Neutrophils Relative %: 59 %
Platelets: 382 10*3/uL (ref 150–400)
RBC: 5.47 MIL/uL (ref 4.22–5.81)
RDW: 13.3 % (ref 11.5–15.5)
WBC: 13 10*3/uL — ABNORMAL HIGH (ref 4.0–10.5)
nRBC: 0 % (ref 0.0–0.2)

## 2018-07-24 LAB — HEPATIC FUNCTION PANEL
ALT: 13 U/L (ref 0–44)
AST: 16 U/L (ref 15–41)
Albumin: 4 g/dL (ref 3.5–5.0)
Alkaline Phosphatase: 55 U/L (ref 38–126)
Bilirubin, Direct: 0.1 mg/dL (ref 0.0–0.2)
Indirect Bilirubin: 0.4 mg/dL (ref 0.3–0.9)
Total Bilirubin: 0.5 mg/dL (ref 0.3–1.2)
Total Protein: 7.1 g/dL (ref 6.5–8.1)

## 2018-07-24 LAB — BASIC METABOLIC PANEL
ANION GAP: 7 (ref 5–15)
BUN: 11 mg/dL (ref 8–23)
CO2: 30 mmol/L (ref 22–32)
Calcium: 8.7 mg/dL — ABNORMAL LOW (ref 8.9–10.3)
Chloride: 102 mmol/L (ref 98–111)
Creatinine, Ser: 0.78 mg/dL (ref 0.61–1.24)
GFR calc non Af Amer: >60 mL/min (ref >60)
Glucose, Bld: 84 mg/dL (ref 70–99)
Potassium: 4.1 mmol/L (ref 3.5–5.1)
Sodium: 139 mmol/L (ref 135–145)

## 2018-07-25 LAB — HEPATIC FUNCTION PANEL
ALT: 14 IU/L (ref 0–44)
AST: 13 IU/L (ref 0–40)
Albumin: 4.4 g/dL (ref 3.7–4.7)
Alkaline Phosphatase: 71 IU/L (ref 39–117)
BILIRUBIN TOTAL: 0.4 mg/dL (ref 0.0–1.2)
Bilirubin, Direct: 0.1 mg/dL (ref 0.00–0.40)
Total Protein: 6.8 g/dL (ref 6.0–8.5)

## 2018-07-25 LAB — CBC WITH DIFFERENTIAL/PLATELET
Basophils Absolute: 0.1 10*3/uL (ref 0.0–0.2)
Basos: 1 %
EOS (ABSOLUTE): 0.9 10*3/uL — ABNORMAL HIGH (ref 0.0–0.4)
EOS: 8 %
Hematocrit: 53.5 % — ABNORMAL HIGH (ref 37.5–51.0)
Hemoglobin: 17.9 g/dL — ABNORMAL HIGH (ref 13.0–17.7)
Immature Grans (Abs): 0 10*3/uL (ref 0.0–0.1)
Immature Granulocytes: 0 %
Lymphocytes Absolute: 3 10*3/uL (ref 0.7–3.1)
Lymphs: 27 %
MCH: 31.2 pg (ref 26.6–33.0)
MCHC: 33.5 g/dL (ref 31.5–35.7)
MCV: 93 fL (ref 79–97)
MONOS ABS: 0.7 10*3/uL (ref 0.1–0.9)
Monocytes: 7 %
Neutrophils Absolute: 6.4 10*3/uL (ref 1.4–7.0)
Neutrophils: 57 %
Platelets: 390 10*3/uL (ref 150–450)
RBC: 5.73 x10E6/uL (ref 4.14–5.80)
RDW: 13.2 % (ref 11.6–15.4)
WBC: 11.2 10*3/uL — ABNORMAL HIGH (ref 3.4–10.8)

## 2018-07-25 LAB — BASIC METABOLIC PANEL
BUN/Creatinine Ratio: 10 (ref 10–24)
BUN: 9 mg/dL (ref 8–27)
CO2: 29 mmol/L (ref 20–29)
Calcium: 9.4 mg/dL (ref 8.6–10.2)
Chloride: 100 mmol/L (ref 96–106)
Creatinine, Ser: 0.92 mg/dL (ref 0.76–1.27)
GFR calc Af Amer: 92 mL/min/{1.73_m2} (ref >59)
GFR calc non Af Amer: 79 mL/min/{1.73_m2} (ref >59)
Glucose: 94 mg/dL (ref 65–99)
POTASSIUM: 4.6 mmol/L (ref 3.5–5.2)
Sodium: 144 mmol/L (ref 134–144)

## 2018-07-29 ENCOUNTER — Other Ambulatory Visit: Payer: Self-pay | Admitting: Family Medicine

## 2018-08-26 IMAGING — CT CT CHEST NODULE FOLLOW UP LOW DOSE W/O CM
1 of 5 series · 5 of 40 positions shown, 7 images · non-contrast
Comparison: 11/09/2014 screening chest CT.

CLINICAL DATA: 75-year-old male former smoker with 180 pack-year
smoking history, quit smoking 4 years prior.

EXAM:
CT CHEST WITHOUT CONTRAST
TECHNIQUE: Multidetector CT imaging of the chest was performed following the
standard protocol without IV contrast.

[ct lung segmentation data · axial · 0.72mm/px · z∈[-276,-276]mm · 5 of 173 frames shown]
[frame 1/173  mediastinal]
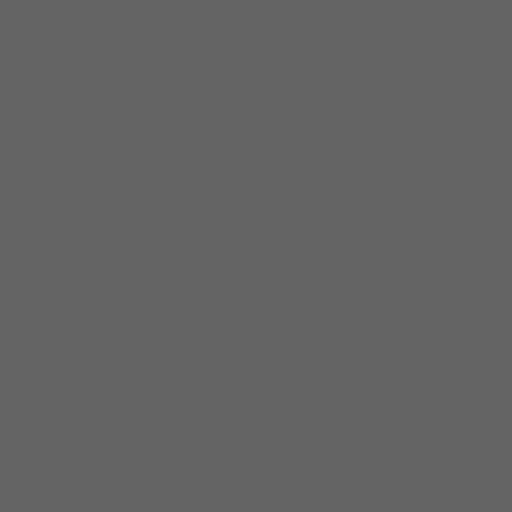
[frame 1/173  lung]
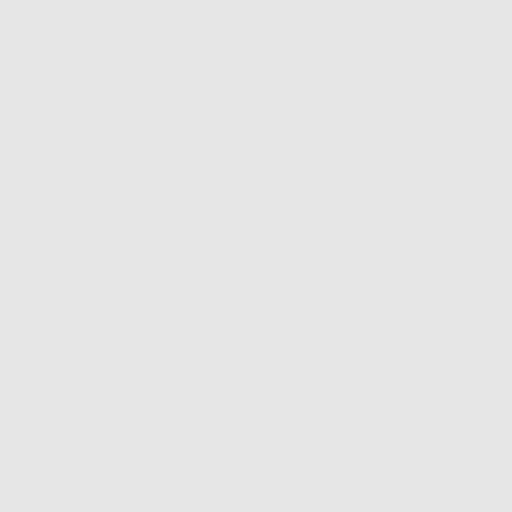
[frame 20/173  lung]
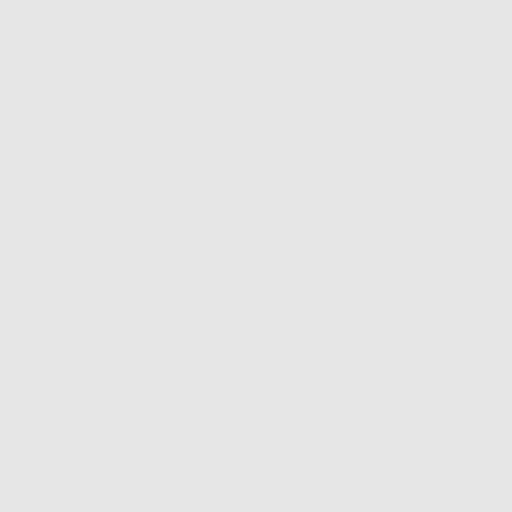
[frame 39/173  lung]
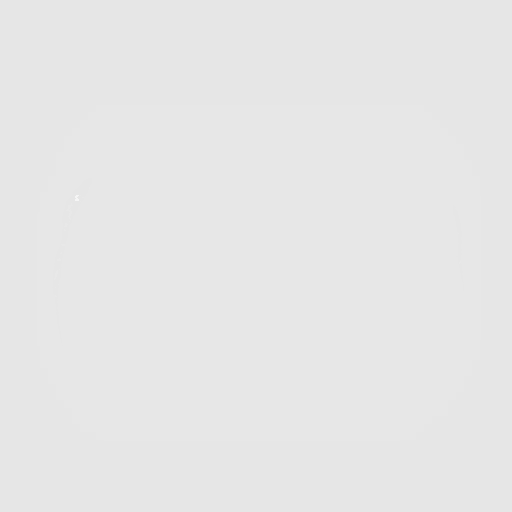
[frame 58/173  lung]
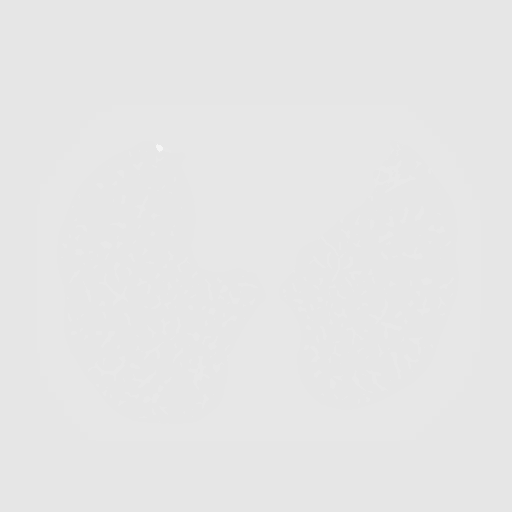
[frame 77/173  mediastinal]
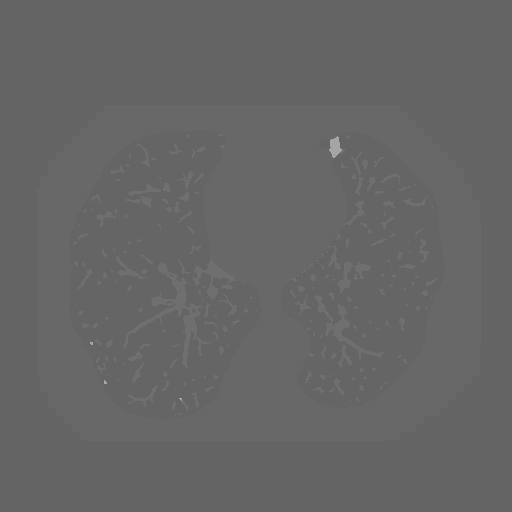
[frame 77/173  lung]
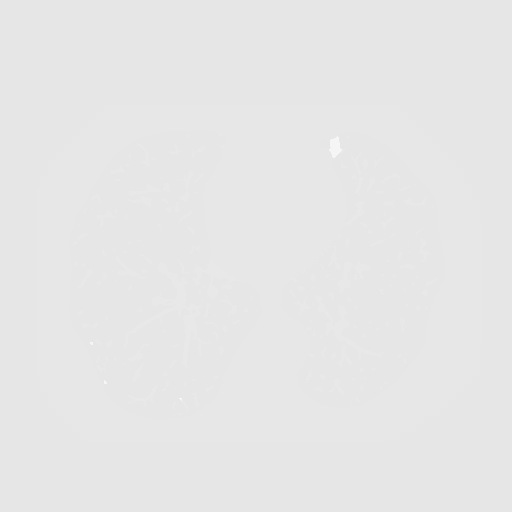

[5 of 40 positions shown; findings below may reference images not displayed]

FINDINGS: Cardiovascular: Normal heart size. No significant pericardial
fluid/thickening. Left main, left anterior descending, left
circumflex and right coronary atherosclerosis. Atherosclerotic
nonaneurysmal thoracic aorta. Normal caliber pulmonary arteries.

Mediastinum/Nodes: No discrete thyroid nodules. Unremarkable
esophagus. No pathologically enlarged axillary, mediastinal or gross
hilar lymph nodes, noting limited sensitivity for the detection of
hilar adenopathy on this noncontrast study. Stable coarsely
calcified granulomatous left hilar nodes.

Lungs/Pleura: No pneumothorax. No pleural effusion. Mild
centrilobular emphysema with diffuse bronchial wall thickening. A
few scattered pulmonary nodules in both lungs measuring up to 4.7 mm
in volume derived mean diameter in the apical right upper lobe
(series 3/ image 31) are not appreciably changed. No new significant
pulmonary nodules. Calcified granuloma in the left lower lobe is
stable.

Upper abdomen: Unremarkable.

Musculoskeletal: No aggressive appearing focal osseous lesions.
Moderate thoracic spondylosis. Stable chronic mild T12 vertebral
compression deformity. Stable sclerotic lesion in the T4 vertebral
body, probably a benign bone island.
IMPRESSION: 1. Lung-RADS Category 2S, benign appearance or behavior. Continue
annual screening with low-dose chest CT without contrast in 12
months.
2. The "S" modifier above refers to potentially clinically
significant non lung cancer related findings. Specifically, left
main and 3 vessel coronary atherosclerosis .
3. Mild centrilobular emphysema with diffuse bronchial wall
thickening, suggesting COPD.
4. Aortic atherosclerosis.

## 2018-09-04 ENCOUNTER — Ambulatory Visit: Payer: PPO | Admitting: Family Medicine

## 2018-09-05 ENCOUNTER — Ambulatory Visit: Payer: PPO | Admitting: Family Medicine

## 2018-09-11 ENCOUNTER — Ambulatory Visit (INDEPENDENT_AMBULATORY_CARE_PROVIDER_SITE_OTHER): Payer: PPO | Admitting: Family Medicine

## 2018-09-11 ENCOUNTER — Other Ambulatory Visit: Payer: Self-pay

## 2018-09-11 ENCOUNTER — Encounter: Payer: Self-pay | Admitting: Family Medicine

## 2018-09-11 VITALS — BP 138/86 | Ht 68.0 in | Wt 143.8 lb

## 2018-09-11 DIAGNOSIS — I10 Essential (primary) hypertension: Secondary | ICD-10-CM | POA: Diagnosis not present

## 2018-09-11 DIAGNOSIS — E782 Mixed hyperlipidemia: Secondary | ICD-10-CM | POA: Diagnosis not present

## 2018-09-11 DIAGNOSIS — J449 Chronic obstructive pulmonary disease, unspecified: Secondary | ICD-10-CM | POA: Diagnosis not present

## 2018-09-11 DIAGNOSIS — F5101 Primary insomnia: Secondary | ICD-10-CM | POA: Diagnosis not present

## 2018-09-11 NOTE — Progress Notes (Signed)
Assessment plan HTN good control with medication continue to watch diet closely minimize salt  COPD stable continue current inhalers  Hyperlipidemia continue current measures watch diet previous labs reviewed  Insomnia uses Xanax as needed does not cause excessive drowsiness  Patient will follow-up in approximately 6 months  15 minutes was spent with patient today discussing healthcare issues which they came.  More than 50% of this visit-total duration of visit-was spent in counseling and coordination of care.  Please see diagnosis regarding the focus of this coordination and care   Patient was seen today for a regular follow-up visit regarding multiple health issues he is trying to watch his diet is trying to eat on a regular basis.  Denies any type of chest tightness pressure pain shortness of breath.  He does have COPD hypertension hyperlipidemia insomnia  On exam lungs are clear heart regular HEENT benign  Extremities no edema

## 2018-09-20 ENCOUNTER — Other Ambulatory Visit: Payer: Self-pay | Admitting: Family Medicine

## 2018-12-04 ENCOUNTER — Other Ambulatory Visit: Payer: Self-pay

## 2018-12-04 ENCOUNTER — Inpatient Hospital Stay (HOSPITAL_COMMUNITY): Payer: PPO | Attending: Hematology

## 2018-12-04 DIAGNOSIS — Z801 Family history of malignant neoplasm of trachea, bronchus and lung: Secondary | ICD-10-CM | POA: Insufficient documentation

## 2018-12-04 DIAGNOSIS — J449 Chronic obstructive pulmonary disease, unspecified: Secondary | ICD-10-CM | POA: Diagnosis not present

## 2018-12-04 DIAGNOSIS — Z85828 Personal history of other malignant neoplasm of skin: Secondary | ICD-10-CM | POA: Insufficient documentation

## 2018-12-04 DIAGNOSIS — F17210 Nicotine dependence, cigarettes, uncomplicated: Secondary | ICD-10-CM | POA: Insufficient documentation

## 2018-12-04 DIAGNOSIS — Z79899 Other long term (current) drug therapy: Secondary | ICD-10-CM | POA: Insufficient documentation

## 2018-12-04 DIAGNOSIS — E785 Hyperlipidemia, unspecified: Secondary | ICD-10-CM | POA: Diagnosis not present

## 2018-12-04 DIAGNOSIS — Z7982 Long term (current) use of aspirin: Secondary | ICD-10-CM | POA: Diagnosis not present

## 2018-12-04 DIAGNOSIS — D72829 Elevated white blood cell count, unspecified: Secondary | ICD-10-CM | POA: Diagnosis not present

## 2018-12-04 DIAGNOSIS — F419 Anxiety disorder, unspecified: Secondary | ICD-10-CM | POA: Insufficient documentation

## 2018-12-04 DIAGNOSIS — D72828 Other elevated white blood cell count: Secondary | ICD-10-CM

## 2018-12-04 LAB — COMPREHENSIVE METABOLIC PANEL
ALT: 12 U/L (ref 0–44)
AST: 15 U/L (ref 15–41)
Albumin: 3.9 g/dL (ref 3.5–5.0)
Alkaline Phosphatase: 56 U/L (ref 38–126)
Anion gap: 10 (ref 5–15)
BUN: 9 mg/dL (ref 8–23)
CO2: 30 mmol/L (ref 22–32)
Calcium: 8.7 mg/dL — ABNORMAL LOW (ref 8.9–10.3)
Chloride: 101 mmol/L (ref 98–111)
Creatinine, Ser: 0.78 mg/dL (ref 0.61–1.24)
GFR calc Af Amer: >60 mL/min (ref >60)
GFR calc non Af Amer: >60 mL/min (ref >60)
Glucose, Bld: 95 mg/dL (ref 70–99)
Potassium: 4.2 mmol/L (ref 3.5–5.1)
Sodium: 141 mmol/L (ref 135–145)
Total Bilirubin: 0.7 mg/dL (ref 0.3–1.2)
Total Protein: 6.6 g/dL (ref 6.5–8.1)

## 2018-12-04 LAB — CBC WITH DIFFERENTIAL/PLATELET
Abs Immature Granulocytes: 0.03 10*3/uL (ref 0.00–0.07)
Basophils Absolute: 0.1 10*3/uL (ref 0.0–0.1)
Basophils Relative: 1 %
Eosinophils Absolute: 0.9 10*3/uL — ABNORMAL HIGH (ref 0.0–0.5)
Eosinophils Relative: 8 %
HCT: 49.9 % (ref 39.0–52.0)
Hemoglobin: 15.7 g/dL (ref 13.0–17.0)
Immature Granulocytes: 0 %
Lymphocytes Relative: 26 %
Lymphs Abs: 2.7 10*3/uL (ref 0.7–4.0)
MCH: 30.3 pg (ref 26.0–34.0)
MCHC: 31.5 g/dL (ref 30.0–36.0)
MCV: 96.3 fL (ref 80.0–100.0)
Monocytes Absolute: 0.8 10*3/uL (ref 0.1–1.0)
Monocytes Relative: 8 %
Neutro Abs: 6.1 10*3/uL (ref 1.7–7.7)
Neutrophils Relative %: 57 %
Platelets: 399 10*3/uL (ref 150–400)
RBC: 5.18 MIL/uL (ref 4.22–5.81)
RDW: 14.6 % (ref 11.5–15.5)
WBC: 10.7 10*3/uL — ABNORMAL HIGH (ref 4.0–10.5)
nRBC: 0 % (ref 0.0–0.2)

## 2018-12-04 LAB — LACTATE DEHYDROGENASE: LDH: 113 U/L (ref 98–192)

## 2018-12-04 NOTE — Progress Notes (Signed)
For review.  Please update ordering provider

## 2018-12-05 ENCOUNTER — Encounter: Payer: Self-pay | Admitting: Family Medicine

## 2018-12-05 ENCOUNTER — Ambulatory Visit (INDEPENDENT_AMBULATORY_CARE_PROVIDER_SITE_OTHER): Payer: PPO | Admitting: Family Medicine

## 2018-12-05 ENCOUNTER — Other Ambulatory Visit: Payer: Self-pay

## 2018-12-05 ENCOUNTER — Other Ambulatory Visit: Payer: PPO

## 2018-12-05 VITALS — BP 148/84 | Temp 97.3°F | Ht 68.0 in | Wt 142.3 lb

## 2018-12-05 DIAGNOSIS — I889 Nonspecific lymphadenitis, unspecified: Secondary | ICD-10-CM | POA: Diagnosis not present

## 2018-12-05 DIAGNOSIS — R6889 Other general symptoms and signs: Secondary | ICD-10-CM | POA: Diagnosis not present

## 2018-12-05 DIAGNOSIS — Z20822 Contact with and (suspected) exposure to covid-19: Secondary | ICD-10-CM

## 2018-12-05 MED ORDER — AMOXICILLIN 500 MG PO CAPS: 500.0000 mg | ORAL_CAPSULE | Freq: Three times a day (TID) | ORAL | 0 refills | Status: DC

## 2018-12-05 NOTE — Progress Notes (Signed)
   Subjective:    Patient ID: George Meyer, male    DOB: September 28, 1940, 78 y.o.   MRN: [id number]  HPIleft side of neck swelling since yesterday.   Patient presents with a 2-day history of mild symptoms in the left anterior neck.  He seems to be particularly concerned about this.  No trouble swallowing no fever.  No headache.  Patient relates a prior history of a similar episode in the past which led to a cascade of referrals scans etc.  Review of Systems No headache, no major weight loss or weight gain, no chest pain no back pain abdominal pain no change in bowel habits complete ROS otherwise negative    //Will cover with antibiotics and reevaluation in a couple weeks Objective:   Physical Exam  Alert vitals stable, NAD. Blood pressure good on repeat. HEENT normal. Lungs clear. Heart regular rate and rhythm. Left anterior neck minimal prominence of cervical lymph node to palpation at most with very slight tenderness, pharynx within normal limits the rest of the neck normal     Assessment & Plan:  Impression cervical lymphadenitis very mild with fair amount of anxiety.  Will cover with trial of antibiotics hopefully discomfort and anxiety will calm down so we do not need to do another work-up

## 2018-12-08 LAB — NOVEL CORONAVIRUS, NAA: SARS-CoV-2, NAA: NOT DETECTED

## 2018-12-10 ENCOUNTER — Other Ambulatory Visit: Payer: Self-pay

## 2018-12-11 ENCOUNTER — Inpatient Hospital Stay (HOSPITAL_BASED_OUTPATIENT_CLINIC_OR_DEPARTMENT_OTHER): Payer: PPO | Admitting: Hematology

## 2018-12-11 ENCOUNTER — Encounter (HOSPITAL_COMMUNITY): Payer: Self-pay | Admitting: Hematology

## 2018-12-11 VITALS — BP 151/89 | HR 79 | Temp 98.4°F | Resp 16 | Wt 140.1 lb

## 2018-12-11 DIAGNOSIS — Z7982 Long term (current) use of aspirin: Secondary | ICD-10-CM | POA: Diagnosis not present

## 2018-12-11 DIAGNOSIS — Z79899 Other long term (current) drug therapy: Secondary | ICD-10-CM | POA: Diagnosis not present

## 2018-12-11 DIAGNOSIS — Z801 Family history of malignant neoplasm of trachea, bronchus and lung: Secondary | ICD-10-CM | POA: Diagnosis not present

## 2018-12-11 DIAGNOSIS — E785 Hyperlipidemia, unspecified: Secondary | ICD-10-CM | POA: Diagnosis not present

## 2018-12-11 DIAGNOSIS — F17210 Nicotine dependence, cigarettes, uncomplicated: Secondary | ICD-10-CM | POA: Diagnosis not present

## 2018-12-11 DIAGNOSIS — F419 Anxiety disorder, unspecified: Secondary | ICD-10-CM

## 2018-12-11 DIAGNOSIS — D72829 Elevated white blood cell count, unspecified: Secondary | ICD-10-CM | POA: Diagnosis not present

## 2018-12-11 DIAGNOSIS — Z720 Tobacco use: Secondary | ICD-10-CM

## 2018-12-11 DIAGNOSIS — Z85828 Personal history of other malignant neoplasm of skin: Secondary | ICD-10-CM | POA: Diagnosis not present

## 2018-12-11 DIAGNOSIS — J449 Chronic obstructive pulmonary disease, unspecified: Secondary | ICD-10-CM

## 2018-12-11 MED ORDER — ALBUTEROL SULFATE HFA 108 (90 BASE) MCG/ACT IN AERS: INHALATION_SPRAY | RESPIRATORY_TRACT | 3 refills | Status: DC

## 2018-12-11 NOTE — Progress Notes (Signed)
George Meyer, [place] [id number]   CLINIC:  Medical Oncology/Hematology  PCP:  Kathyrn Drown, MD [address] [place] [address] [phone]   REASON FOR VISIT:  Follow-up for leukocytosis  CURRENT THERAPY: Clinical surveillance    INTERVAL HISTORY:  Mr. Livsey 78 y.o. male presents today for follow-up.  Reports overall doing well.  Denies any significant fatigue.  Denies any recent infections.  Denies any fevers, chills, night sweats.  Patient does continue to smoke half a pack of cigarettes a day.  Denies any change in his weight.  Denies any abdominal pain.  No change in bowel habits.  He is here for repeat labs and office visit.   REVIEW OF SYSTEMS:  Review of Systems  Constitutional: Negative.   HENT:  Negative.   Eyes: Negative.   Respiratory: Negative.   Cardiovascular: Negative.   Gastrointestinal: Negative.   Endocrine: Negative.   Genitourinary: Negative.    Musculoskeletal: Negative.   Skin: Negative.   Neurological: Negative.   Hematological: Negative.   Psychiatric/Behavioral: Negative.      PAST MEDICAL/SURGICAL HISTORY:  Past Medical History:  Diagnosis Date  . Anxiety   . COPD (chronic obstructive pulmonary disease) ([place])   . Glaucoma    both eyes  . Hyperlipidemia   . Pre-diabetes   . Skin cancer, basal cell    Past Surgical History:  Procedure Laterality Date  . CARDIAC CATHETERIZATION N/A 12/2005   negative  . COLONOSCOPY N/A 12/2009   small polyp repeat in 10 years  . HAND SURGERY Right 2010   dupuytrens excision right little finger     SOCIAL HISTORY:  Social History   Socioeconomic History  . Marital status: Single    Spouse name: Not on file  . Number of children: Not on file  . Years of education: Not on file  . Highest education level: Not on file  Occupational History  . Not on file  Social Needs  . Financial resource strain: Not on file  . Food insecurity   Worry: Not on file    Inability: Not on file  . Transportation needs    Medical: Not on file    Non-medical: Not on file  Tobacco Use  . Smoking status: Current Every Day Smoker    Packs/day: 2.00    Years: 60.00    Pack years: 120.00    Types: Cigarettes  . Smokeless tobacco: Never Used  . Tobacco comment: started smoking age 14  Substance and Sexual Activity  . Alcohol use: No  . Drug use: No  . Sexual activity: Yes    Partners: Female  Lifestyle  . Physical activity    Days per week: Not on file    Minutes per session: Not on file  . Stress: Not on file  Relationships  . Social Herbalist on phone: Not on file    Gets together: Not on file    Attends religious service: Not on file    Active member of club or organization: Not on file    Attends meetings of clubs or organizations: Not on file    Relationship status: Not on file  . Intimate partner violence    Fear of current or ex partner: Not on file    Emotionally abused: Not on file    Physically abused: Not on file    Forced sexual activity: Not on file  Other Topics Concern  .  Not on file  Social History Narrative  . Not on file    FAMILY HISTORY:  Family History  Problem Relation Age of Onset  . Heart attack Father   . Heart attack Mother   . Lung cancer Brother     CURRENT MEDICATIONS:  Outpatient Encounter Medications as of 12/11/2018  Medication Sig Note  . albuterol (PROVENTIL HFA;VENTOLIN HFA) 108 (90 Base) MCG/ACT inhaler INHALE 2 PUFFS BY MOUTH EVERY 4 HOURS AS NEEDED   . albuterol (PROVENTIL) (2.5 MG/3ML) 0.083% nebulizer solution USE 1 VIAL IN NEBULIZER EVERY 4 HOURS AS NEEDED FOR WHEEZING   . albuterol (VENTOLIN HFA) 108 (90 Base) MCG/ACT inhaler INHALE TWO PUFFS BY MOUTH EVERY 4 HOURS AS NEEDED   . ALPRAZolam (XANAX) 0.5 MG tablet TAKE 1 TABLET BY MOUTH AT BEDTIME AS NEEDED FOR ANXIETY   . aspirin 81 MG tablet Take 81 mg by mouth daily.   . budesonide (PULMICORT) 0.5 MG/2ML  nebulizer solution Take 0.5 mg by nebulization 2 (two) times daily.   . budesonide-formoterol (SYMBICORT) 160-4.5 MCG/ACT inhaler INHALE TWO PUFFS BY MOUTH TWICE DAILY ** STOP SPIRIVA **   . chlorzoxazone (PARAFON FORTE DSC) 500 MG tablet Take 1 tablet (500 mg total) by mouth 4 (four) times daily as needed for muscle spasms.   . dorzolamide-timolol (COSOPT) 22.3-6.8 MG/ML ophthalmic solution Place 1 drop into both eyes 2 (two) times daily.    Marland Kitchen ibuprofen (ADVIL,MOTRIN) 200 MG tablet Take 400 mg by mouth every 6 (six) hours as needed for headache.   . latanoprost (XALATAN) 0.005 % ophthalmic solution Place 1 drop into both eyes at bedtime.  10/19/2014: Received from: External Pharmacy  . lisinopril (PRINIVIL,ZESTRIL) 5 MG tablet Take 1 tablet by mouth once daily   . pravastatin (PRAVACHOL) 40 MG tablet TAKE 1 TABLET BY MOUTH ONCE DAILY   . SAW PALMETTO, SERENOA REPENS, PO Take 1 capsule by mouth daily.    . [DISCONTINUED] albuterol (VENTOLIN HFA) 108 (90 Base) MCG/ACT inhaler INHALE TWO PUFFS BY MOUTH EVERY 4 HOURS AS NEEDED   . nitroGLYCERIN (NITROSTAT) 0.4 MG SL tablet 1 sl q 5 min prn chest pressure as directed (Patient not taking: Reported on 12/11/2018)   . [DISCONTINUED] ALPRAZolam (XANAX) 0.5 MG tablet Take 1 tablet (0.5 mg total) by mouth at bedtime as needed for anxiety.   . [DISCONTINUED] amoxicillin (AMOXIL) 500 MG capsule Take 1 capsule (500 mg total) by mouth 3 (three) times daily.    No facility-administered encounter medications on file as of 12/11/2018.     ALLERGIES:  Allergies  Allergen Reactions  . Amlodipine Itching     PHYSICAL EXAM:  ECOG Performance status: 1  Vitals:   12/11/18 1400  BP: (!) 151/89  Pulse: 79  Resp: 16  Temp: 98.4 F (36.9 C)  SpO2: 93%   Filed Weights   12/11/18 1400  Weight: 140 lb 2 oz (63.6 kg)    Physical Exam Constitutional:      Appearance: Normal appearance.  HENT:     Head: Normocephalic.     Mouth/Throat:     Mouth: Mucous  membranes are moist.     Pharynx: Oropharynx is clear.  Eyes:     Extraocular Movements: Extraocular movements intact.     Conjunctiva/sclera: Conjunctivae normal.  Neck:     Musculoskeletal: Normal range of motion.  Cardiovascular:     Rate and Rhythm: Normal rate and regular rhythm.     Pulses: Normal pulses.     Heart sounds: Normal  heart sounds.  Pulmonary:     Effort: Pulmonary effort is normal.     Breath sounds: Normal breath sounds.  Abdominal:     General: Bowel sounds are normal.     Palpations: Abdomen is soft.  Musculoskeletal: Normal range of motion.  Skin:    General: Skin is warm and dry.  Neurological:     General: No focal deficit present.     Mental Status: He is alert and oriented to person, place, and time.  Psychiatric:        Mood and Affect: Mood normal.        Behavior: Behavior normal.        Thought Content: Thought content normal.        Judgment: Judgment normal.      LABORATORY DATA:  I have reviewed the labs as listed.  CBC    Component Value Date/Time   WBC 10.7 (H) 12/04/2018 1136   RBC 5.18 12/04/2018 1136   HGB 15.7 12/04/2018 1136   HGB 17.9 (H) 07/24/2018 0803   HCT 49.9 12/04/2018 1136   HCT 53.5 (H) 07/24/2018 0803   PLT 399 12/04/2018 1136   PLT 390 07/24/2018 0803   MCV 96.3 12/04/2018 1136   MCV 93 07/24/2018 0803   MCH 30.3 12/04/2018 1136   MCHC 31.5 12/04/2018 1136   RDW 14.6 12/04/2018 1136   RDW 13.2 07/24/2018 0803   LYMPHSABS 2.7 12/04/2018 1136   LYMPHSABS 3.0 07/24/2018 0803   MONOABS 0.8 12/04/2018 1136   EOSABS 0.9 (H) 12/04/2018 1136   EOSABS 0.9 (H) 07/24/2018 0803   BASOSABS 0.1 12/04/2018 1136   BASOSABS 0.1 07/24/2018 0803   CMP Latest Ref Rng & Units 12/04/2018 07/24/2018 07/24/2018  Glucose 70 - 99 mg/dL 95 84 94  BUN 8 - 23 mg/dL 9 11 9   Creatinine 0.61 - 1.24 mg/dL 0.78 0.78 0.92  Sodium 135 - 145 mmol/L 141 139 144  Potassium 3.5 - 5.1 mmol/L 4.2 4.1 4.6  Chloride 98 - 111 mmol/L 101 102 100   CO2 22 - 32 mmol/L 30 30 29   Calcium 8.9 - 10.3 mg/dL 8.7(L) 8.7(L) 9.4  Total Protein 6.5 - 8.1 g/dL 6.6 7.1 6.8  Total Bilirubin 0.3 - 1.2 mg/dL 0.7 0.5 0.4  Alkaline Phos 38 - 126 U/L 56 55 71  AST 15 - 41 U/L 15 16 13   ALT 0 - 44 U/L 12 13 14      ASSESSMENT & PLAN:   Leukocytosis 1.  Leucocytosis and thrombocytosis.   - He had extensive evaluation with JAK 2 and BCR/ABL negative.  - Leukocytosis likely secondary to smoking -Blood cell count slightly elevated at 10.7 differential significant for eosinophils mildly elevated at 0.9.   -Thrombocytosis has resolved.  -Return to clinic in 1 year for repeat labs and office visit.  2.  Tobacco abuse -Patient has a history of smoking greater than 30 years and is over the age of 72.  Have recommended a CT chest low-dose screening.  This is to be scheduled in the next 2 weeks.  Will call patient with results.      Orders placed this encounter:  Orders Placed This Encounter  Procedures  . CT CHEST LUNG CA SCREEN LOW DOSE W/O CM  . CBC with Differential  . Comprehensive metabolic panel  . Lactate dehydrogenase      Roger Shelter, [place] [phone]

## 2018-12-11 NOTE — Assessment & Plan Note (Signed)
1.  Leucocytosis and thrombocytosis.   - He had extensive evaluation with JAK 2 and BCR/ABL negative.  - Leukocytosis likely secondary to smoking -Blood cell count slightly elevated at 10.7 differential significant for eosinophils mildly elevated at 0.9.   -Thrombocytosis has resolved.  -Return to clinic in 1 year for repeat labs and office visit.  2.  Tobacco abuse -Patient has a history of smoking greater than 30 years and is over the age of 56.  Have recommended a CT chest low-dose screening.  This is to be scheduled in the next 2 weeks.  Will call patient with results.

## 2018-12-12 ENCOUNTER — Other Ambulatory Visit (HOSPITAL_COMMUNITY): Payer: Self-pay | Admitting: Hematology

## 2018-12-12 MED ORDER — ALBUTEROL SULFATE HFA 108 (90 BASE) MCG/ACT IN AERS: INHALATION_SPRAY | RESPIRATORY_TRACT | 3 refills | Status: DC

## 2018-12-18 ENCOUNTER — Other Ambulatory Visit: Payer: Self-pay

## 2018-12-18 ENCOUNTER — Encounter: Payer: Self-pay | Admitting: Family Medicine

## 2018-12-18 ENCOUNTER — Ambulatory Visit (INDEPENDENT_AMBULATORY_CARE_PROVIDER_SITE_OTHER): Payer: PPO | Admitting: Family Medicine

## 2018-12-18 VITALS — Temp 97.7°F | Ht 68.0 in | Wt 142.6 lb

## 2018-12-18 DIAGNOSIS — F321 Major depressive disorder, single episode, moderate: Secondary | ICD-10-CM

## 2018-12-18 DIAGNOSIS — I889 Nonspecific lymphadenitis, unspecified: Secondary | ICD-10-CM

## 2018-12-18 DIAGNOSIS — M65352 Trigger finger, left little finger: Secondary | ICD-10-CM

## 2018-12-18 DIAGNOSIS — M720 Palmar fascial fibromatosis [Dupuytren]: Secondary | ICD-10-CM

## 2018-12-18 HISTORY — DX: Major depressive disorder, single episode, moderate: F32.1

## 2018-12-18 MED ORDER — TRAZODONE HCL 50 MG PO TABS: ORAL_TABLET | ORAL | 5 refills | Status: DC

## 2018-12-18 NOTE — Progress Notes (Signed)
   Subjective:    Patient ID: George Meyer, male    DOB: Jun 24, 1941, 78 y.o.   MRN: [id number]  HPI Patient relates a lot of fatigue tiredness feeling down lateral lousy interest in things poor appetite some weight loss he feels he is depressed he is willing to go on medications he does try to get some social interactions with others he denies being suicidal or homicidal  Patient arrives for a follow up on cervical lymphadenitis. Patient states the place went away the day after he was seen.  This patient states is now gone down he feels things are going better  He states his breathing overall is doing well he is doing the best he can at minimizing his risk of coronavirus   Patient also reports problems with trigger finger in left hand.  Review of Systems  Constitutional: Negative for activity change, fatigue and fever.  HENT: Negative for congestion and rhinorrhea.   Respiratory: Negative for cough and shortness of breath.   Cardiovascular: Negative for chest pain and leg swelling.  Gastrointestinal: Negative for abdominal pain, diarrhea and nausea.  Genitourinary: Negative for dysuria and hematuria.  Neurological: Negative for weakness and headaches.  Psychiatric/Behavioral: Negative for agitation and behavioral problems.       Objective:   Physical Exam Constitutional:      General: He is not in acute distress.    Appearance: He is well-developed.  HENT:     Head: Normocephalic.  Cardiovascular:     Rate and Rhythm: Normal rate and regular rhythm.     Heart sounds: Normal heart sounds. No murmur.  Pulmonary:     Effort: Pulmonary effort is normal.     Breath sounds: Normal breath sounds.  Skin:    General: Skin is warm and dry.  Neurological:     Mental Status: He is alert.  Psychiatric:        Behavior: Behavior normal.      25 minutes was spent with the patient.  This statement verifies that 25 minutes was indeed spent with the patient.  More than 50% of this  visit-total duration of the visit-was spent in counseling and coordination of care. The issues that the patient came in for today as reflected in the diagnosis (s) please refer to documentation for further details.      Assessment & Plan:  With the contracture he needs to see orthopedics hand specialist  COPD stable continue all current measures coronavirus protection was discussed  Cervical lymphadenopathy has resolved no findings of any type of mass or growth  Patient has precautionary CT scan of the lungs later this month  Major depression moderate along with insomnia try trazodone 50 mg nightly follow-up 3 weeks for virtual visit warning signs cautionary signs discussed

## 2018-12-18 NOTE — Patient Instructions (Signed)
As part of today's visit a referral has been made. This is a process that is handled by our clinical referral specialists. This process requires that we send your medical information to the specialists for their review before they will issue you an appointment.  Unfortunately this does take time and much of this process is under the responsibility of the specialists.  For emergent referrals we do our best to speed up this process. Our referral specialist will make certain that your insurance company preferred list is taken into account. Emergent referrals are made as quick as possible.  Most standard referrals often take 10-14 days before the specialists office will connect with you to set up the appointment.. If you have not heard when your appointment is from [place] or the referral specialists within 10-14 days please call us regarding this referral.

## 2018-12-26 ENCOUNTER — Encounter: Payer: Self-pay | Admitting: Family Medicine

## 2018-12-30 ENCOUNTER — Other Ambulatory Visit: Payer: Self-pay

## 2018-12-30 ENCOUNTER — Ambulatory Visit (HOSPITAL_COMMUNITY)
Admission: RE | Admit: 2018-12-30 | Discharge: 2018-12-30 | Disposition: A | Discharge status: Home / Self Care | Payer: PPO | Source: Ambulatory Visit | Attending: Hematology | Admitting: Hematology

## 2018-12-30 DIAGNOSIS — Z136 Encounter for screening for cardiovascular disorders: Secondary | ICD-10-CM | POA: Diagnosis not present

## 2018-12-30 DIAGNOSIS — F17210 Nicotine dependence, cigarettes, uncomplicated: Secondary | ICD-10-CM | POA: Diagnosis not present

## 2018-12-30 DIAGNOSIS — Z122 Encounter for screening for malignant neoplasm of respiratory organs: Secondary | ICD-10-CM | POA: Insufficient documentation

## 2018-12-30 DIAGNOSIS — I2510 Atherosclerotic heart disease of native coronary artery without angina pectoris: Secondary | ICD-10-CM | POA: Insufficient documentation

## 2018-12-30 DIAGNOSIS — Z720 Tobacco use: Secondary | ICD-10-CM

## 2018-12-30 DIAGNOSIS — I700 Atherosclerosis of aorta: Secondary | ICD-10-CM | POA: Diagnosis not present

## 2018-12-30 DIAGNOSIS — J439 Emphysema, unspecified: Secondary | ICD-10-CM | POA: Insufficient documentation

## 2019-01-06 ENCOUNTER — Other Ambulatory Visit: Payer: Self-pay | Admitting: Family Medicine

## 2019-01-08 ENCOUNTER — Ambulatory Visit (INDEPENDENT_AMBULATORY_CARE_PROVIDER_SITE_OTHER): Payer: PPO | Admitting: Family Medicine

## 2019-01-08 ENCOUNTER — Ambulatory Visit (INDEPENDENT_AMBULATORY_CARE_PROVIDER_SITE_OTHER): Payer: PPO | Admitting: Orthopaedic Surgery

## 2019-01-08 ENCOUNTER — Encounter: Payer: Self-pay | Admitting: Orthopaedic Surgery

## 2019-01-08 ENCOUNTER — Other Ambulatory Visit: Payer: Self-pay

## 2019-01-08 ENCOUNTER — Ambulatory Visit: Payer: PPO | Admitting: Family Medicine

## 2019-01-08 DIAGNOSIS — E782 Mixed hyperlipidemia: Secondary | ICD-10-CM

## 2019-01-08 DIAGNOSIS — M720 Palmar fascial fibromatosis [Dupuytren]: Secondary | ICD-10-CM | POA: Diagnosis not present

## 2019-01-08 DIAGNOSIS — I700 Atherosclerosis of aorta: Secondary | ICD-10-CM

## 2019-01-08 DIAGNOSIS — I10 Essential (primary) hypertension: Secondary | ICD-10-CM | POA: Diagnosis not present

## 2019-01-08 DIAGNOSIS — F5101 Primary insomnia: Secondary | ICD-10-CM

## 2019-01-08 DIAGNOSIS — R911 Solitary pulmonary nodule: Secondary | ICD-10-CM

## 2019-01-08 MED ORDER — LISINOPRIL 5 MG PO TABS: 5.0000 mg | ORAL_TABLET | Freq: Every day | ORAL | 1 refills | Status: DC

## 2019-01-08 MED ORDER — TRAZODONE HCL 100 MG PO TABS: ORAL_TABLET | ORAL | 5 refills | Status: DC

## 2019-01-08 MED ORDER — ALPRAZOLAM 0.5 MG PO TABS: ORAL_TABLET | ORAL | 5 refills | Status: DC

## 2019-01-08 MED ORDER — PRAVASTATIN SODIUM 40 MG PO TABS: 40.0000 mg | ORAL_TABLET | Freq: Every day | ORAL | 1 refills | Status: DC

## 2019-01-08 NOTE — Addendum Note (Signed)
Addended by: Vicente Males on: 01/08/2019 01:18 PM   Modules accepted: Orders

## 2019-01-08 NOTE — Progress Notes (Signed)
Office Visit Note   Patient: George Meyer           Date of Birth: Oct 29, 1940           MRN: [id number] Visit Date: 01/08/2019              Requested by: Kathyrn Drown, MD [place],  [place] [address] PCP: Kathyrn Drown, MD   Assessment & Plan: Visit Diagnoses:  1. Dupuytren's contracture of left hand             And left small finger  Plan: Patient with symptomatic Dupuytren's left hand with progression of contraction over the last year.  No involvement of the thumb index long ring finger.  He like to proceed with excision Dupuytren's contracture since it bothers him on a daily basis with activities of daily living.  Plan outpatient surgery.  We discussed cutting back on his smoking.  Discussed risks of possible recurrence.  Problems with vessel spasm wound healing.  Questions elicited and answered he understands [place] proceed.  Follow-Up Instructions: No follow-ups on file.   Orders:  No orders of the defined types were placed in this encounter.  No orders of the defined types were placed in this encounter.     Procedures: No procedures performed   Clinical Data: No additional findings.   Subjective: Chief Complaint  Patient presents with  . Hand Problem    trigger finger    HPI 78 year old male seen here for consultation request of Dr. Sallee Lange.  Patient's had problems with left hand Dupuytren's involving the palm and small finger with progressive flexion deformity.  It bothers him with activities he gets jar lids stuck in his hand difficulty getting his finger extended out of the way and in the past is injured his finger due to inability to extend the small finger and clear objects.  Past history 10+ years ago of excision Dupuytren's right palm and fifth finger.  He describes problems with granuloma that 3 to 4 weeks to gradually heal and required multiple weeks of therapy.  He has not had any true triggering of the fifth finger.   Review of Systems positive for COPD long-term smoker 1/2 pack/day x 60 years.  He does have some COPD history of cataracts wears glasses.  No chest pain he states he has had nitroglycerin for more than 5 years but never uses it.  Negative history for MI or CVA.   Objective: Vital Signs: BP (!) 151/89   Pulse 79   Ht 5\' 7"  (1.702 m)   Wt 142 lb (64.4 kg)   BMI 22.24 kg/m   Physical Exam Constitutional:      Appearance: He is well-developed.  HENT:     Head: Normocephalic and atraumatic.  Eyes:     Pupils: Pupils are equal, round, and reactive to light.  Neck:     Thyroid: No thyromegaly.     Trachea: No tracheal deviation.  Cardiovascular:     Rate and Rhythm: Normal rate.  Pulmonary:     Effort: Pulmonary effort is normal.     Breath sounds: No wheezing.  Abdominal:     General: Bowel sounds are normal.     Palpations: Abdomen is soft.  Skin:    General: Skin is warm and dry.     Capillary Refill: Capillary refill takes less than 2 seconds.  Neurological:     Mental Status: He is alert and oriented to person, place,  and time.  Psychiatric:        Behavior: Behavior normal.        Thought Content: Thought content normal.        Judgment: Judgment normal.     Ortho Exam patient has well-healed incision right hand from Dupuytren's excision in the palm and small finger.  No cords are present in the right hand.  Full extension flexion of the fingers.  Left hand shows prominent Dupuytren's cord that extends from the distal palmar crease along the ulnar aspect of the small finger and extends between the PIP and DIP crease on the ulnar aspect of the finger.  He has inability to extend the last 30 degrees MCP joint.  There is 45 degrees flexion due to the Dupuytren's of the PIP joint.  DIP reach full extension good capillary refill.  Allen's test at the wrist and fifth finger is normal.  Specialty Comments:  No specialty comments available.  Imaging: No results found.   PMFS  History: Patient Active Problem List   Diagnosis Date Noted  . Dupuytren's contracture of left hand 01/08/2019  . Depression, major, single episode, moderate ([place]) 12/18/2018  . Aortic atherosclerosis ([place]) 12/30/2016  . COPD exacerbation ([place]) 07/04/2016  . Insomnia 02/16/2016  . Hyperlipidemia 04/18/2015  . Solitary pulmonary nodule 11/09/2014  . HTN (hypertension) 12/17/2013  . Leukocytosis 12/22/2012  . Other malaise and fatigue 12/04/2012  . Generalized anxiety disorder 12/27/2009  . IBS 12/27/2009  . BLOOD IN STOOL, OCCULT 12/27/2009  . HYPERTRIGLYCERIDEMIA 12/26/2009  . COPD mixed type ([place]) 12/26/2009  . ABDOMINAL PAIN, LEFT LOWER QUADRANT 12/26/2009   Past Medical History:  Diagnosis Date  . Anxiety   . COPD (chronic obstructive pulmonary disease) ([facility])   . Depression, major, single episode, moderate ([place]) 12/18/2018  . Glaucoma    both eyes  . Hyperlipidemia   . Pre-diabetes   . Skin cancer, basal cell     Family History  Problem Relation Age of Onset  . Heart attack Father   . Heart attack Mother   . Lung cancer Brother     Past Surgical History:  Procedure Laterality Date  . CARDIAC CATHETERIZATION N/A 12/2005   negative  . COLONOSCOPY N/A 12/2009   small polyp repeat in 10 years  . HAND SURGERY Right 2010   dupuytrens excision right little finger   Social History   Occupational History  . Not on file  Tobacco Use  . Smoking status: Current Every Day Smoker    Packs/day: 2.00    Years: 60.00    Pack years: 120.00    Types: Cigarettes  . Smokeless tobacco: Never Used  . Tobacco comment: started smoking age 72  Substance and Sexual Activity  . Alcohol use: No  . Drug use: No  . Sexual activity: Yes    Partners: Female

## 2019-01-08 NOTE — Progress Notes (Addendum)
Subjective:    Patient ID: George Meyer, male    DOB: 10-27-1940, 78 y.o.   MRN: [id number] Telephone only HPI Pt having 3 week follow up from 12/18/2018 visit for lymphadenitis.  Patient states he feels that this area is gone down and is no longer an issue   pt states he just returned from the orthopedic specialist. Pt states they are going to be setting up surgery in the near future.  Pt is wanted to know if we have heard back from his CAT scan he had done about a week and a half ago.  Patient feels his depression doing little bit better with the medication tolerating trazodone would like to try higher dose states he is sleeping better at nighttime  Patient for blood pressure check up.  The patient does have hypertension.  The patient is on medication.  Patient relates compliance with meds. Todays BP reviewed with the patient. Patient denies issues with medication. Patient relates reasonable diet. Patient tries to minimize salt. Patient aware of BP goals.  Anxiousness overall doing well uses Xanax late in evening to help with this  Patient here for follow-up regarding cholesterol.  The patient does have hyperlipidemia.  Patient does try to maintain a reasonable diet.  Patient does take the medication on a regular basis.  Denies missing a dose.  The patient denies any obvious side effects.  Prior blood work results reviewed with the patient.  The patient is aware of his cholesterol goals and the need to keep it under good control to lessen the risk of disease.   Virtual Visit via Video Note  I connected with [name] on 01/08/19 at 11:00 AM EDT by a video enabled telemedicine application and verified that I am speaking with the correct person using two identifiers.  Location: Patient: home Provider: office   I discussed the limitations of evaluation and management by telemedicine and the availability of in person appointments. The patient expressed understanding and agreed  to proceed.  History of Present Illness:    Observations/Objective:   Assessment and Plan:   Follow Up Instructions:    I discussed the assessment and treatment plan with the patient. The patient was provided an opportunity to ask questions and all were answered. The patient agreed with the plan and demonstrated an understanding of the instructions.   The patient was advised to call back or seek an in-person evaluation if the symptoms worsen or if the condition fails to improve as anticipated.  I provided 15 minutes of non-face-to-face time during this encounter.   Vicente Males, LPN   Review of Systems  Constitutional: Negative for diaphoresis and fatigue.  HENT: Negative for congestion and rhinorrhea.   Respiratory: Negative for cough and shortness of breath.   Cardiovascular: Negative for chest pain and leg swelling.  Gastrointestinal: Negative for abdominal pain and diarrhea.  Skin: Negative for color change and rash.  Neurological: Negative for dizziness and headaches.  Psychiatric/Behavioral: Negative for behavioral problems and confusion.       Objective:   Physical Exam  Patient does get out of breath with vigorous activity but states moving around the house no shortness of breath or chest pressure or pain he is able to do housework within the house and simple yard work  Today's visit was via telephone Physical exam was not possible for this visit     Assessment & Plan:  1. Essential hypertension HTN- Patient was seen today as part of a  visit regarding hypertension. The importance of healthy diet and regular physical activity was discussed. The importance of compliance with medications discussed.  Ideal goal is to keep blood pressure low elevated levels certainly below 242/35 when possible.  The patient was counseled that keeping blood pressure under control lessen his risk of complications.  The importance of regular follow-ups was discussed with the  patient.  Low-salt diet such as DASH recommended.  Regular physical activity was recommended as well.  Patient was advised to keep regular follow-ups.   2. Mixed hyperlipidemia The patient was seen today as part of an evaluation regarding hyperlipidemia.  Recent lab work has been reviewed with the patient as well as the goals for good cholesterol care.  In addition to this medications have been discussed the importance of compliance with diet and medications discussed as well.  Finally the patient is aware that poor control of cholesterol, noncompliance can dramatically increase the risk of complications. The patient will keep regular office visits and the patient does agreed to periodic lab work. Patient will do lipid profile near future other lab work is recently been completed  3. Solitary pulmonary nodule CT scan did not show any lung cancer he will do another one in 1 year it did show atherosclerosis as well as coronary artery disease he does a good job keeping cholesterol blood pressure under good control  4. Primary insomnia Uses Xanax at night  Depression under better control but he would like to try increased dose of trazodone we will bump that up if he is not significantly improved with that he will let us know otherwise follow-up in the fall also if the medicine causes drowsiness to let us know  25 minutes was spent with the patient.  This statement verifies that 25 minutes was indeed spent with the patient.  More than 50% of this visit-total duration of the visit-was spent in counseling and coordination of care. The issues that the patient came in for today as reflected in the diagnosis (s) please refer to documentation for further details.  This patient does have underlying COPD.  He is not on oxygen.  He uses albuterol intermittently.  He does use his Symbicort twice daily.  His orthopedic surgeon states they are going to do an excision of a small finger Dupuytren's contracture.   Patient should be able to do well for surgery for this.  It is advised though for postoperative to pay particular close attention to his pulmonary function.  We will send a copy of this dictation to his orthopedist Dr. Lorin Mercy

## 2019-01-18 ENCOUNTER — Telehealth: Payer: Self-pay | Admitting: Family Medicine

## 2019-01-18 NOTE — Telephone Encounter (Signed)
We will call in touch base with the patient regarding upcoming surgery we discussed this at his last office visit

## 2019-01-19 ENCOUNTER — Telehealth: Payer: Self-pay | Admitting: Family Medicine

## 2019-01-19 NOTE — Telephone Encounter (Signed)
I did receive a request for surgical clearance for Mr. George Meyer for a hearing surgery.  I believe the patient should be able to do well with this without trouble he does have underlying COPD so therefore minimal amount of time under general anesthesia the better patient is aware of this and was advised to talk to surgeon regarding risk and benefits of surgery we will amend his most recent visit and send it to the orthopedist

## 2019-01-20 DIAGNOSIS — E782 Mixed hyperlipidemia: Secondary | ICD-10-CM | POA: Diagnosis not present

## 2019-01-21 LAB — LIPID PANEL
Chol/HDL Ratio: 3.8 ratio (ref 0.0–5.0)
Cholesterol, Total: 140 mg/dL (ref 100–199)
HDL: 37 mg/dL — ABNORMAL LOW (ref >39)
LDL Calculated: 63 mg/dL (ref 0–99)
Triglycerides: 198 mg/dL — ABNORMAL HIGH (ref 0–149)
VLDL Cholesterol Cal: 40 mg/dL (ref 5–40)

## 2019-01-22 ENCOUNTER — Encounter: Payer: Self-pay | Admitting: Family Medicine

## 2019-01-22 NOTE — Telephone Encounter (Signed)
I did have a discussion with the patient regarding surgery.  It appears he is going to be having hand surgery.  They plan on putting him to sleep.  He does have COPD but this surgery will be a day surgery and brief he should be able to do okay with this.  Certainly if they were able to do a digital block in order to do his surgery it would be better but otherwise I think he would still be able to tolerate surgery.  She has already talked to the surgeon regarding risk and benefits.

## 2019-01-27 NOTE — Pre-Procedure Instructions (Signed)
George Meyer  01/27/2019      [facility] - [place], [place] - [id number] [place] [address] Phone: [phone] Fax: [phone]    Your procedure is scheduled on February 02, 2019.  Report to [facility] Admitting at 645 AM.  Call this number if you have problems the morning of surgery:  [phone]  Call [phone] if you have any questions prior to your surgery date Monday-Friday 8am-4pm    Remember:  Do not eat  after midnight.  You may drink clear liquids until 545 AM the day of surgery.  Clear liquids allowed are:Pre-surgery Ensure, Water, Juice (non-citric and without pulp), Clear Tea, Black Coffee only and Gatorade   Please complete your PRE-SURGERY ENSURE that was provided to you by 5:45 AM the morning of surgery.  Please, if able, drink it in one setting. DO NOT SIP.    Take these medicines the morning of surgery with A SIP OF WATER  Albuterol inhaler-if needed Albuterol Nebulizer Symbicort inhaler Chlorazozazone (parafon) Cosopt eye drops Nitrostat-if needed for chest pain  Please bring all inhalers with you day of surgery  Follow your surgeon's instructions on when to hold/resume aspirin.  If no instructions were given call the office to determine how they would like to you take aspirin  7 days prior to surgery STOP taking any  Aleve, Naproxen, Ibuprofen, Motrin, Advil, Goody's, BC's, all herbal medications, fish oil, and all vitamins   Day of surgery:  Do not wear jewelry  Do not wear lotions, powders, or colognes, or deodorant.  Men may shave face and neck.  Do not bring valuables to the hospital.   [facility] is not responsible for any belongings or valuables.  IF you are a smoker, DO NOT Smoke 24 hours prior to surgery   IF you wear a CPAP at night please bring your mask, tubing, and machine the morning of surgery    Remember that you must have someone to transport you home after your  surgery, and remain with you for 24 hours if you are discharged the same day.  Contacts, dentures or bridgework may not be worn into surgery.  Leave your suitcase in the car.  After surgery it may be brought to your room.  For patients admitted to the hospital, discharge time will be determined by your treatment team.  Patients discharged the day of surgery will not be allowed to drive home.   [facility]- Preparing For Surgery  Before surgery, you can play an important role. Because skin is not sterile, your skin needs to be as free of germs as possible. You can reduce the number of germs on your skin by washing with CHG (chlorahexidine gluconate) Soap before surgery.  CHG is an antiseptic cleaner which kills germs and bonds with the skin to continue killing germs even after washing.    Oral Hygiene is also important to reduce your risk of infection.  Remember - BRUSH YOUR TEETH THE MORNING OF SURGERY WITH YOUR REGULAR TOOTHPASTE  Please do not use if you have an allergy to CHG or antibacterial soaps. If your skin becomes reddened/irritated stop using the CHG.  Do not shave (including legs and underarms) for at least 48 hours prior to first CHG shower. It is OK to shave your face.  Please follow these instructions carefully.   1. Shower the NIGHT BEFORE SURGERY and the MORNING OF SURGERY with CHG.   2. If  you chose to wash your hair, wash your hair first as usual with your normal shampoo.  3. After you shampoo, rinse your hair and body thoroughly to remove the shampoo.  4. Use CHG as you would any other liquid soap. You can apply CHG directly to the skin and wash gently with a scrungie or a clean washcloth.   5. Apply the CHG Soap to your body ONLY FROM THE NECK DOWN.  Do not use on open wounds or open sores. Avoid contact with your eyes, ears, mouth and genitals (private parts). Wash Face and genitals (private parts)  with your normal soap.  6. Wash thoroughly, paying special attention  to the area where your surgery will be performed.  7. Thoroughly rinse your body with warm water from the neck down.  8. DO NOT shower/wash with your normal soap after using and rinsing off the CHG Soap.  9. Pat yourself dry with a CLEAN TOWEL.  10. Wear CLEAN PAJAMAS to bed the night before surgery, wear comfortable clothes the morning of surgery  11. Place CLEAN SHEETS on your bed the night of your first shower and DO NOT SLEEP WITH PETS.  Day of Surgery:  Do not apply any deodorants/lotions.  Please wear clean clothes to the hospital/surgery center.   Remember to brush your teeth WITH YOUR REGULAR TOOTHPASTE.   Please read over the following fact sheets that you were given.

## 2019-01-28 ENCOUNTER — Encounter (HOSPITAL_COMMUNITY): Payer: Self-pay

## 2019-01-28 ENCOUNTER — Encounter (HOSPITAL_COMMUNITY)
Admission: RE | Admit: 2019-01-28 | Discharge: 2019-01-28 | Disposition: A | Discharge status: Home / Self Care | Payer: PPO | Source: Ambulatory Visit | Attending: Orthopaedic Surgery | Admitting: Orthopaedic Surgery

## 2019-01-28 ENCOUNTER — Other Ambulatory Visit: Payer: Self-pay

## 2019-01-28 DIAGNOSIS — Z20828 Contact with and (suspected) exposure to other viral communicable diseases: Secondary | ICD-10-CM | POA: Diagnosis not present

## 2019-01-28 DIAGNOSIS — Z01818 Encounter for other preprocedural examination: Secondary | ICD-10-CM | POA: Insufficient documentation

## 2019-01-28 HISTORY — DX: Essential (primary) hypertension: I10

## 2019-01-28 HISTORY — DX: Presence of dental prosthetic device (complete) (partial): Z97.2

## 2019-01-28 HISTORY — DX: Unspecified cataract: H26.9

## 2019-01-28 LAB — COMPREHENSIVE METABOLIC PANEL
ALT: 12 U/L (ref 0–44)
AST: 16 U/L (ref 15–41)
Albumin: 4.1 g/dL (ref 3.5–5.0)
Alkaline Phosphatase: 52 U/L (ref 38–126)
Anion gap: 9 (ref 5–15)
BUN: 12 mg/dL (ref 8–23)
CO2: 28 mmol/L (ref 22–32)
Calcium: 9.2 mg/dL (ref 8.9–10.3)
Chloride: 101 mmol/L (ref 98–111)
Creatinine, Ser: 0.81 mg/dL (ref 0.61–1.24)
GFR calc Af Amer: >60 mL/min (ref >60)
GFR calc non Af Amer: >60 mL/min (ref >60)
Glucose, Bld: 96 mg/dL (ref 70–99)
Potassium: 4.3 mmol/L (ref 3.5–5.1)
Sodium: 138 mmol/L (ref 135–145)
Total Bilirubin: 0.5 mg/dL (ref 0.3–1.2)
Total Protein: 6.9 g/dL (ref 6.5–8.1)

## 2019-01-28 LAB — CBC
HCT: 51.4 % (ref 39.0–52.0)
Hemoglobin: 17 g/dL (ref 13.0–17.0)
MCH: 31.7 pg (ref 26.0–34.0)
MCHC: 33.1 g/dL (ref 30.0–36.0)
MCV: 95.7 fL (ref 80.0–100.0)
Platelets: 417 10*3/uL — ABNORMAL HIGH (ref 150–400)
RBC: 5.37 MIL/uL (ref 4.22–5.81)
RDW: 13.6 % (ref 11.5–15.5)
WBC: 10.9 10*3/uL — ABNORMAL HIGH (ref 4.0–10.5)
nRBC: 0 % (ref 0.0–0.2)

## 2019-01-28 LAB — SURGICAL PCR SCREEN
MRSA, PCR: NEGATIVE
Staphylococcus aureus: NEGATIVE

## 2019-01-28 NOTE — Progress Notes (Signed)
Patient denies shortness of breath, fever, cough and chest pain at PAT appointment  PCP - Dr Sallee Lange Cardiologist - Denies  Chest x-ray - 07/24/18 EKG - 01/28/19 Stress Test - 2017 ECHO - Denies Cardiac Cath - 2007  ERAS: G2 Drink given, clears til 5:45 am DOS  Aspirin Instructions: Patient to call MD for aspirin instructions on when to hold/resume aspirin prior to to surgery.  Patient agrees to call MD for instructions.  Anesthesia review: Yes  Coronavirus Screening Have you or Annalee Genta experienced the following symptoms:  Cough yes/no: No Fever (>100.10F)  yes/no: No Runny nose yes/no: No Sore throat yes/no: No Difficulty breathing/shortness of breath  yes/no: No  Have you or Annalee Genta traveled in the last 14 days and where? yes/no: No

## 2019-01-29 ENCOUNTER — Other Ambulatory Visit (HOSPITAL_COMMUNITY)
Admission: RE | Admit: 2019-01-29 | Discharge: 2019-01-29 | Disposition: A | Discharge status: Home / Self Care | Payer: PPO | Source: Ambulatory Visit | Attending: Orthopaedic Surgery | Admitting: Orthopaedic Surgery

## 2019-01-29 DIAGNOSIS — Z01818 Encounter for other preprocedural examination: Secondary | ICD-10-CM | POA: Diagnosis not present

## 2019-01-29 LAB — SARS CORONAVIRUS 2 (TAT 6-24 HRS): SARS Coronavirus 2: NEGATIVE

## 2019-01-29 NOTE — Anesthesia Preprocedure Evaluation (Addendum)
Anesthesia Evaluation  Patient identified by MRN, date of birth, ID band Patient awake    Reviewed: Allergy & Precautions, H&P , NPO status , Patient's Chart, lab work & pertinent test results  Airway Mallampati: II  TM Distance: >3 FB Neck ROM: Full    Dental no notable dental hx. (+) Edentulous Upper, Edentulous Lower, Dental Advisory Given   Pulmonary COPD,  COPD inhaler, Current Smoker,    Pulmonary exam normal breath sounds clear to auscultation       Cardiovascular Exercise Tolerance: Good hypertension, Pt. on medications  Rhythm:Regular Rate:Normal     Neuro/Psych Anxiety Depression negative neurological ROS     GI/Hepatic negative GI ROS, Neg liver ROS,   Endo/Other  negative endocrine ROS  Renal/GU negative Renal ROS  negative genitourinary   Musculoskeletal   Abdominal   Peds  Hematology negative hematology ROS (+)   Anesthesia Other Findings   Reproductive/Obstetrics negative OB ROS                           Anesthesia Physical Anesthesia Plan  ASA: II  Anesthesia Plan: General   Post-op Pain Management:    Induction: Intravenous  PONV Risk Score and Plan: 2 and Ondansetron and Dexamethasone  Airway Management Planned: LMA  Additional Equipment:   Intra-op Plan:   Post-operative Plan:   Informed Consent: I have reviewed the patients History and Physical, chart, labs and discussed the procedure including the risks, benefits and alternatives for the proposed anesthesia with the patient or authorized representative who has indicated his/her understanding and acceptance.     Dental advisory given  Plan Discussed with: CRNA  Anesthesia Plan Comments: (Cleared for surgery by PCP "This patient does have underlying COPD.  He is not on oxygen.  He uses albuterol intermittently.  He does use his Symbicort twice daily.  His orthopedic surgeon states they are going to do  an excision of a small finger Dupuytren's contracture.  Patient should be able to do well for surgery for this.  It is advised though for postoperative to pay particular close attention to his pulmonary function.  We will send a copy of this dictation to his orthopedist Dr. Lorin Mercy")      Anesthesia Quick Evaluation

## 2019-02-02 ENCOUNTER — Other Ambulatory Visit: Payer: Self-pay

## 2019-02-02 ENCOUNTER — Ambulatory Visit (HOSPITAL_COMMUNITY)
Admission: RE | Admit: 2019-02-02 | Discharge: 2019-02-02 | Disposition: A | Discharge status: Home / Self Care | Payer: PPO | Attending: Orthopaedic Surgery | Admitting: Orthopaedic Surgery

## 2019-02-02 ENCOUNTER — Encounter (HOSPITAL_COMMUNITY): Payer: Self-pay

## 2019-02-02 ENCOUNTER — Ambulatory Visit (HOSPITAL_COMMUNITY): Payer: PPO | Admitting: Physician Assistant

## 2019-02-02 ENCOUNTER — Ambulatory Visit (HOSPITAL_COMMUNITY): Payer: PPO | Admitting: Anesthesiology

## 2019-02-02 ENCOUNTER — Encounter (HOSPITAL_COMMUNITY)
Admission: RE | Disposition: A | Discharge status: Home / Self Care | Payer: Self-pay | Source: Home / Self Care | Attending: Orthopaedic Surgery

## 2019-02-02 DIAGNOSIS — F17210 Nicotine dependence, cigarettes, uncomplicated: Secondary | ICD-10-CM | POA: Diagnosis not present

## 2019-02-02 DIAGNOSIS — F321 Major depressive disorder, single episode, moderate: Secondary | ICD-10-CM | POA: Diagnosis not present

## 2019-02-02 DIAGNOSIS — M720 Palmar fascial fibromatosis [Dupuytren]: Secondary | ICD-10-CM | POA: Diagnosis not present

## 2019-02-02 DIAGNOSIS — I10 Essential (primary) hypertension: Secondary | ICD-10-CM | POA: Insufficient documentation

## 2019-02-02 DIAGNOSIS — J449 Chronic obstructive pulmonary disease, unspecified: Secondary | ICD-10-CM | POA: Insufficient documentation

## 2019-02-02 DIAGNOSIS — Z85828 Personal history of other malignant neoplasm of skin: Secondary | ICD-10-CM | POA: Insufficient documentation

## 2019-02-02 HISTORY — PX: DUPUYTREN CONTRACTURE RELEASE: SHX1478

## 2019-02-02 SURGERY — RELEASE, DUPUYTREN CONTRACTURE
Anesthesia: General | Site: Hand | Laterality: Left

## 2019-02-02 MED ORDER — FENTANYL CITRATE (PF) 100 MCG/2ML IJ SOLN: 25.0000 ug | INTRAMUSCULAR | Status: DC | PRN

## 2019-02-02 MED ORDER — PROPOFOL 10 MG/ML IV BOLUS
INTRAVENOUS | Status: DC | PRN
  Administered 2019-02-02: 100 mg via INTRAVENOUS
  Administered 2019-02-02: 30 mg via INTRAVENOUS

## 2019-02-02 MED ORDER — CEFAZOLIN SODIUM-DEXTROSE 2-4 GM/100ML-% IV SOLN
INTRAVENOUS | Status: AC
  Filled 2019-02-02: qty 100

## 2019-02-02 MED ORDER — ROCURONIUM BROMIDE 10 MG/ML (PF) SYRINGE
PREFILLED_SYRINGE | INTRAVENOUS | Status: AC
  Filled 2019-02-02: qty 20

## 2019-02-02 MED ORDER — LIDOCAINE 2% (20 MG/ML) 5 ML SYRINGE
INTRAMUSCULAR | Status: DC | PRN
  Administered 2019-02-02: 60 mg via INTRAVENOUS

## 2019-02-02 MED ORDER — ACETAMINOPHEN 500 MG PO TABS
1000.0000 mg | ORAL_TABLET | Freq: Once | ORAL | Status: AC
  Administered 2019-02-02: 1000 mg via ORAL

## 2019-02-02 MED ORDER — EPHEDRINE SULFATE-NACL 50-0.9 MG/10ML-% IV SOSY
PREFILLED_SYRINGE | INTRAVENOUS | Status: DC | PRN
  Administered 2019-02-02: 5 mg via INTRAVENOUS
  Administered 2019-02-02 (×2): 10 mg via INTRAVENOUS

## 2019-02-02 MED ORDER — LACTATED RINGERS IV SOLN
INTRAVENOUS | Status: DC
  Administered 2019-02-02 (×2): via INTRAVENOUS

## 2019-02-02 MED ORDER — ACETAMINOPHEN 500 MG PO TABS
ORAL_TABLET | ORAL | Status: AC
  Administered 2019-02-02: 07:00:00 1000 mg via ORAL
  Filled 2019-02-02: qty 2

## 2019-02-02 MED ORDER — OXYCODONE-ACETAMINOPHEN 5-325 MG PO TABS: 1.0000 | ORAL_TABLET | Freq: Four times a day (QID) | ORAL | 0 refills | Status: DC | PRN

## 2019-02-02 MED ORDER — ONDANSETRON HCL 4 MG/2ML IJ SOLN
INTRAMUSCULAR | Status: AC
  Filled 2019-02-02: qty 2

## 2019-02-02 MED ORDER — 0.9 % SODIUM CHLORIDE (POUR BTL) OPTIME
TOPICAL | Status: DC | PRN
  Administered 2019-02-02: 1000 mL

## 2019-02-02 MED ORDER — FENTANYL CITRATE (PF) 100 MCG/2ML IJ SOLN
INTRAMUSCULAR | Status: DC | PRN
  Administered 2019-02-02 (×2): 50 ug via INTRAVENOUS

## 2019-02-02 MED ORDER — LIDOCAINE 2% (20 MG/ML) 5 ML SYRINGE
INTRAMUSCULAR | Status: AC
  Filled 2019-02-02: qty 15

## 2019-02-02 MED ORDER — EPHEDRINE 5 MG/ML INJ
INTRAVENOUS | Status: AC
  Filled 2019-02-02: qty 30

## 2019-02-02 MED ORDER — CEFAZOLIN SODIUM-DEXTROSE 2-4 GM/100ML-% IV SOLN
2.0000 g | INTRAVENOUS | Status: AC
  Administered 2019-02-02: 2 g via INTRAVENOUS

## 2019-02-02 MED ORDER — ONDANSETRON HCL 4 MG/2ML IJ SOLN
INTRAMUSCULAR | Status: DC | PRN
  Administered 2019-02-02: 4 mg via INTRAVENOUS

## 2019-02-02 MED ORDER — CHLORHEXIDINE GLUCONATE 4 % EX LIQD: 60.0000 mL | Freq: Once | CUTANEOUS | Status: DC

## 2019-02-02 MED ORDER — DEXAMETHASONE SODIUM PHOSPHATE 10 MG/ML IJ SOLN
INTRAMUSCULAR | Status: DC | PRN
  Administered 2019-02-02: 10 mg via INTRAVENOUS

## 2019-02-02 MED ORDER — FENTANYL CITRATE (PF) 250 MCG/5ML IJ SOLN
INTRAMUSCULAR | Status: AC
  Filled 2019-02-02: qty 5

## 2019-02-02 SURGICAL SUPPLY — 56 items
BANDAGE ELASTIC 4 VELCRO ST LF (GAUZE/BANDAGES/DRESSINGS) ×2 IMPLANT
BENZOIN TINCTURE PRP APPL 2/3 (GAUZE/BANDAGES/DRESSINGS) ×3 IMPLANT
BLADE CLIPPER SURG (BLADE) IMPLANT
BNDG COHESIVE 1X5 TAN STRL LF (GAUZE/BANDAGES/DRESSINGS) IMPLANT
BNDG CONFORM 3 STRL LF (GAUZE/BANDAGES/DRESSINGS) IMPLANT
BNDG ELASTIC 2X5.8 VLCR STR LF (GAUZE/BANDAGES/DRESSINGS) IMPLANT
BNDG ELASTIC 4X5.8 VLCR STR LF (GAUZE/BANDAGES/DRESSINGS) IMPLANT
BNDG ESMARK 4X9 LF (GAUZE/BANDAGES/DRESSINGS) ×2 IMPLANT
CLOSURE WOUND 1/2 X4 (GAUZE/BANDAGES/DRESSINGS) ×1
CORD BIPOLAR FORCEPS 12FT (ELECTRODE) ×3 IMPLANT
COVER SURGICAL LIGHT HANDLE (MISCELLANEOUS) ×3 IMPLANT
COVER WAND RF STERILE (DRAPES) ×3 IMPLANT
CUFF TOURN SGL QUICK 18X4 (TOURNIQUET CUFF) ×2 IMPLANT
CUFF TOURN SGL QUICK 24 (TOURNIQUET CUFF)
CUFF TRNQT CYL 24X4X16.5-23 (TOURNIQUET CUFF) IMPLANT
DRSG EMULSION OIL 3X3 NADH (GAUZE/BANDAGES/DRESSINGS) IMPLANT
DRSG XEROFORM 1X8 (GAUZE/BANDAGES/DRESSINGS) ×4 IMPLANT
DURAPREP 26ML APPLICATOR (WOUND CARE) ×3 IMPLANT
ELECT REM PT RETURN 9FT ADLT (ELECTROSURGICAL)
ELECTRODE REM PT RTRN 9FT ADLT (ELECTROSURGICAL) IMPLANT
GAUZE SPONGE 2X2 8PLY STRL LF (GAUZE/BANDAGES/DRESSINGS) IMPLANT
GAUZE SPONGE 4X4 12PLY STRL (GAUZE/BANDAGES/DRESSINGS) IMPLANT
GAUZE SPONGE 4X4 12PLY STRL LF (GAUZE/BANDAGES/DRESSINGS) ×4 IMPLANT
GAUZE XEROFORM 1X8 LF (GAUZE/BANDAGES/DRESSINGS) IMPLANT
GLOVE BIOGEL PI IND STRL 8 (GLOVE) ×1 IMPLANT
GLOVE BIOGEL PI INDICATOR 8 (GLOVE) ×2
GLOVE ORTHO TXT STRL SZ7.5 (GLOVE) ×3 IMPLANT
GOWN STRL REUS W/ TWL LRG LVL3 (GOWN DISPOSABLE) ×2 IMPLANT
GOWN STRL REUS W/TWL LRG LVL3 (GOWN DISPOSABLE) ×4
KIT BASIN OR (CUSTOM PROCEDURE TRAY) ×3 IMPLANT
KIT TURNOVER KIT B (KITS) ×3 IMPLANT
MANIFOLD NEPTUNE II (INSTRUMENTS) ×3 IMPLANT
NS IRRIG 1000ML POUR BTL (IV SOLUTION) ×3 IMPLANT
PACK ORTHO EXTREMITY (CUSTOM PROCEDURE TRAY) ×3 IMPLANT
PAD ARMBOARD 7.5X6 YLW CONV (MISCELLANEOUS) ×6 IMPLANT
PAD CAST 4YDX4 CTTN HI CHSV (CAST SUPPLIES) IMPLANT
PADDING CAST COTTON 4X4 STRL (CAST SUPPLIES) ×2
SPECIMEN JAR SMALL (MISCELLANEOUS) ×3 IMPLANT
SPONGE GAUZE 2X2 STER 10/PKG (GAUZE/BANDAGES/DRESSINGS)
STRIP CLOSURE SKIN 1/2X4 (GAUZE/BANDAGES/DRESSINGS) ×2 IMPLANT
SUCTION FRAZIER HANDLE 10FR (MISCELLANEOUS)
SUCTION TUBE FRAZIER 10FR DISP (MISCELLANEOUS) IMPLANT
SUT ETHIBOND 4 0 TF (SUTURE) ×2 IMPLANT
SUT ETHIBOND 5 0 P 3 (SUTURE)
SUT ETHILON 4 0 P 3 18 (SUTURE) ×11 IMPLANT
SUT ETHILON 5 0 P 3 18 (SUTURE)
SUT MERSILENE 5-0 (SUTURE) ×1 IMPLANT
SUT NYLON ETHILON 5-0 P-3 1X18 (SUTURE) ×1 IMPLANT
SUT POLY ETHIBOND 5-0 P-3 1X18 (SUTURE) ×1 IMPLANT
SUT PROLENE 4 0 P 3 18 (SUTURE) ×1 IMPLANT
SUT SILK 4 0 PS 2 (SUTURE) ×1 IMPLANT
TOWEL GREEN STERILE (TOWEL DISPOSABLE) ×1 IMPLANT
TOWEL GREEN STERILE FF (TOWEL DISPOSABLE) ×3 IMPLANT
TUBE CONNECTING 12'X1/4 (SUCTIONS)
TUBE CONNECTING 12X1/4 (SUCTIONS) IMPLANT
WATER STERILE IRR 1000ML POUR (IV SOLUTION) ×1 IMPLANT

## 2019-02-02 NOTE — Anesthesia Procedure Notes (Signed)
Procedure Name: LMA Insertion Date/Time: 02/02/2019 9:09 AM Performed by: Mariea Clonts, CRNA Pre-anesthesia Checklist: Patient identified, Emergency Drugs available, Suction available, Patient being monitored and Timeout performed Patient Re-evaluated:Patient Re-evaluated prior to induction Oxygen Delivery Method: Circle System Utilized Preoxygenation: Pre-oxygenation with 100% oxygen Induction Type: IV induction LMA: LMA inserted LMA Size: 4.0 Number of attempts: 1 Placement Confirmation: positive ETCO2 Tube secured with: Tape Dental Injury: Teeth and Oropharynx as per pre-operative assessment

## 2019-02-02 NOTE — Op Note (Signed)
Preop diagnosis: Dupuytren's contracture left hand and left small finger  Postop diagnosis: Same  Procedure: Excisions of Dupuytren's contracture left hand palm and left ring finger.  Surgeon: Rodell Perna, MD  Anesthesia: Preoperative block plus LMA.  Tourniquet: See anesthetic record.  Procedure after induction of anesthesia preoperative block standard prepping and draping with DuraPrep preoperative antibiotics were given timeout procedure was completed arm was elevated after a tourniquet been applied before the prep and arm and hand was exsanguinated tourniquet inflated to 250 tourniquet inflated.  Purple skin marker been used for zigzag incision and contracture extending from the end of the carpal canal in the palm directly overlying the flexor tendon sheath and there was contracture of the PIP and MCP joint with a palpable cord that extended past the PIP crease to the mid level of the middle phalanx.  Cord was slightly more the ulnar aspect at the proximal finger crease.  The past drawing obliquely to the PIP crease along the radial aspect.  Skin flaps were opened and a 4-0 nylon was placed at the corner of each skin flap.  Prominent cord was noted and ulnar nerve was picked up at the into the carpal canal with the vascular arch and common digital nerve was followed is sending all the way to the proximal finger crease.  Once radial ulnar digital nerve to the small finger was visualized with accompanying artery the Dupuytren's was excised from the palmar fascia and cord was resected out to the middle finger crease.  This allowed further extension of the finger there was no blanching of the finger.  Palpation with the fingertip showed no residual disease tissue present.  Wound was irrigated tourniquet deflated.  All skin flaps had good vascularity and 4-0 nylon interrupted sutures were used for closure.  Xeroform 4 x 4's 20 years fluffs web roll and Ace wrap was applied for postoperative dressing  patient tolerated the procedure well had good capillary refill to the fingertip and was transferred recovery room.

## 2019-02-02 NOTE — Interval H&P Note (Signed)
History and Physical Interval Note:  02/02/2019 7:30 AM  George Meyer  has presented today for surgery, with the diagnosis of left hand and small finger dupytrens.  The various methods of treatment have been discussed with the patient and family. After consideration of risks, benefits and other options for treatment, the patient has consented to  Procedure(s): EXCISION LEFT HAND AND SMALL FINGER DUPUYTREN CONTRACTURE RELEASE (Left) as a surgical intervention.  The patient's history has been reviewed, patient examined, no change in status, stable for surgery.  I have reviewed the patient's chart and labs.  Questions were answered to the patient's satisfaction.     Marybelle Killings

## 2019-02-02 NOTE — H&P (Signed)
Office Visit Note              Patient: George Meyer                                    Date of Birth: [birth year]                                                    MRN: [id number] Visit Date: 01/08/2019                                                                     Requested by: Kathyrn Drown, MD [place],  [place] [address] PCP: Kathyrn Drown, MD   Assessment & Plan: Visit Diagnoses:  1. Dupuytren's contracture of left hand             And left small finger  Plan: Patient with symptomatic Dupuytren's left hand with progression of contraction over the last year.  No involvement of the thumb index long ring finger.  He like to proceed with excision Dupuytren's contracture since it bothers him on a daily basis with activities of daily living.  Plan outpatient surgery.  We discussed cutting back on his smoking.  Discussed risks of possible recurrence.  Problems with vessel spasm wound healing.  Questions elicited and answered he understands [place] proceed.  Follow-Up Instructions: No follow-ups on file.   Orders:  No orders of the defined types were placed in this encounter.  No orders of the defined types were placed in this encounter.     Procedures: No procedures performed   Clinical Data: No additional findings.   Subjective:     Chief Complaint  Patient presents with  . Hand Problem    trigger finger    HPI 78 year old male seen here for consultation request of Dr. Sallee Lange.  Patient's had problems with left hand Dupuytren's involving the palm and small finger with progressive flexion deformity.  It bothers him with activities he gets jar lids stuck in his hand difficulty getting his finger extended out of the way and in the past is injured his finger due to inability to extend the small finger and clear objects.  Past history 10+ years ago of excision Dupuytren's right palm and fifth finger.  He describes problems with  granuloma that 3 to 4 weeks to gradually heal and required multiple weeks of therapy.  He has not had any true triggering of the fifth finger.  Review of Systems positive for COPD long-term smoker 1/2 pack/day x 60 years.  He does have some COPD history of cataracts wears glasses.  No chest pain he states he has had nitroglycerin for more than 5 years but never uses it.  Negative history for MI or CVA.   Objective: Vital Signs: BP (!) 151/89   Pulse 79   Ht 5\' 7"  (1.702 m)   Wt 142 lb (64.4 kg)   BMI 22.24 kg/m   Physical Exam Constitutional:  Appearance: He is well-developed.  HENT:     Head: Normocephalic and atraumatic.  Eyes:     Pupils: Pupils are equal, round, and reactive to light.  Neck:     Thyroid: No thyromegaly.     Trachea: No tracheal deviation.  Cardiovascular:     Rate and Rhythm: Normal rate.  Pulmonary:     Effort: Pulmonary effort is normal.     Breath sounds: No wheezing.  Abdominal:     General: Bowel sounds are normal.     Palpations: Abdomen is soft.  Skin:    General: Skin is warm and dry.     Capillary Refill: Capillary refill takes less than 2 seconds.  Neurological:     Mental Status: He is alert and oriented to person, place, and time.  Psychiatric:        Behavior: Behavior normal.        Thought Content: Thought content normal.        Judgment: Judgment normal.     Ortho Exam patient has well-healed incision right hand from Dupuytren's excision in the palm and small finger.  No cords are present in the right hand.  Full extension flexion of the fingers.  Left hand shows prominent Dupuytren's cord that extends from the distal palmar crease along the ulnar aspect of the small finger and extends between the PIP and DIP crease on the ulnar aspect of the finger.  He has inability to extend the last 30 degrees MCP joint.  There is 45 degrees flexion due to the Dupuytren's of the PIP joint.  DIP reach full extension good capillary refill.   Allen's test at the wrist and fifth finger is normal.  Specialty Comments:  No specialty comments available.  Imaging: No results found.   PMFS History: Patient Active Problem List   Diagnosis Date Noted  . Dupuytren's contracture of left hand 01/08/2019  . Depression, major, single episode, moderate ([place]) 12/18/2018  . Aortic atherosclerosis ([place]) 12/30/2016  . COPD exacerbation ([place]) 07/04/2016  . Insomnia 02/16/2016  . Hyperlipidemia 04/18/2015  . Solitary pulmonary nodule 11/09/2014  . HTN (hypertension) 12/17/2013  . Leukocytosis 12/22/2012  . Other malaise and fatigue 12/04/2012  . Generalized anxiety disorder 12/27/2009  . IBS 12/27/2009  . BLOOD IN STOOL, OCCULT 12/27/2009  . HYPERTRIGLYCERIDEMIA 12/26/2009  . COPD mixed type ([place]) 12/26/2009  . ABDOMINAL PAIN, LEFT LOWER QUADRANT 12/26/2009       Past Medical History:  Diagnosis Date  . Anxiety   . COPD (chronic obstructive pulmonary disease) ([place])   . Depression, major, single episode, moderate ([place]) 12/18/2018  . Glaucoma    both eyes  . Hyperlipidemia   . Pre-diabetes   . Skin cancer, basal cell          Family History  Problem Relation Age of Onset  . Heart attack Father   . Heart attack Mother   . Lung cancer Brother          Past Surgical History:  Procedure Laterality Date  . CARDIAC CATHETERIZATION N/A 12/2005   negative  . COLONOSCOPY N/A 12/2009   small polyp repeat in 10 years  . HAND SURGERY Right 2010   dupuytrens excision right little finger   Social History        Occupational History  . Not on file  Tobacco Use  . Smoking status: Current Every Day Smoker    Packs/day: 2.00    Years: 60.00    Pack years: 120.00  Types: Cigarettes  . Smokeless tobacco: Never Used  . Tobacco comment: started smoking age 66  Substance and Sexual Activity  . Alcohol use: No  . Drug use: No  . Sexual activity: Yes    Partners: Female

## 2019-02-02 NOTE — Anesthesia Postprocedure Evaluation (Signed)
Anesthesia Post Note  Patient: George Meyer  Procedure(s) Performed: EXCISION LEFT HAND AND SMALL FINGER DUPUYTREN CONTRACTURE RELEASE (Left Hand)     Patient location during evaluation: PACU Anesthesia Type: General Level of consciousness: awake and alert Pain management: pain level controlled Vital Signs Assessment: post-procedure vital signs reviewed and stable Respiratory status: spontaneous breathing, nonlabored ventilation and respiratory function stable Cardiovascular status: blood pressure returned to baseline and stable Postop Assessment: no apparent nausea or vomiting Anesthetic complications: no    Last Vitals:  Vitals:   02/02/19 1107 02/02/19 1115  BP: 126/77 130/68  Pulse: 78 80  Resp: 20   Temp:  (!) 36.1 C  SpO2: 91% 94%    Last Pain:  Vitals:   02/02/19 1115  TempSrc:   PainSc: 0-No pain                 Courvoisier Hamblen,W. EDMOND

## 2019-02-02 NOTE — Transfer of Care (Cosign Needed)
Immediate Anesthesia Transfer of Care Note  Patient: George Meyer  Procedure(s) Performed: EXCISION LEFT HAND AND SMALL FINGER DUPUYTREN CONTRACTURE RELEASE (Left Hand)  Patient Location: PACU  Anesthesia Type:General  Level of Consciousness: awake, alert  and oriented  Airway & Oxygen Therapy: Patient Spontanous Breathing and Patient connected to face mask oxygen  Post-op Assessment: Report given to RN, Post -op Vital signs reviewed and stable, Patient moving all extremities X 4 and Patient able to stick tongue midline  Post vital signs: Reviewed and stable  Last Vitals:  Vitals Value Taken Time  BP 137/72 02/02/19 1042  Temp    Pulse 78 02/02/19 1043  Resp 28 02/02/19 1043  SpO2 100 % 02/02/19 1043  Vitals shown include unvalidated device data.  Last Pain:  Vitals:   02/02/19 0702  TempSrc:   PainSc: 0-No pain      Patients Stated Pain Goal: 3 (23/76/28 3151)  Complications: No apparent anesthesia complications

## 2019-02-03 ENCOUNTER — Encounter (HOSPITAL_COMMUNITY): Payer: Self-pay | Admitting: Orthopaedic Surgery

## 2019-02-12 ENCOUNTER — Ambulatory Visit (INDEPENDENT_AMBULATORY_CARE_PROVIDER_SITE_OTHER): Payer: PPO | Admitting: Orthopaedic Surgery

## 2019-02-12 ENCOUNTER — Other Ambulatory Visit: Payer: Self-pay

## 2019-02-12 ENCOUNTER — Encounter: Payer: Self-pay | Admitting: Orthopaedic Surgery

## 2019-02-12 DIAGNOSIS — M720 Palmar fascial fibromatosis [Dupuytren]: Secondary | ICD-10-CM

## 2019-02-12 NOTE — Progress Notes (Signed)
10 days postop left hand small finger and palm Dupuytren's excision.  All skin edges look good new dressing applied he can return in a week for wound check and possible suture removal.  He is not taking any pain medication and is happy with the surgical result.

## 2019-02-19 ENCOUNTER — Other Ambulatory Visit: Payer: Self-pay

## 2019-02-19 ENCOUNTER — Ambulatory Visit (INDEPENDENT_AMBULATORY_CARE_PROVIDER_SITE_OTHER): Payer: PPO | Admitting: Orthopaedic Surgery

## 2019-02-19 VITALS — Ht 68.0 in | Wt 140.0 lb

## 2019-02-19 DIAGNOSIS — M720 Palmar fascial fibromatosis [Dupuytren]: Secondary | ICD-10-CM

## 2019-02-19 NOTE — Progress Notes (Signed)
Postop Dupuytren's release.  Sutures were harvested today and flaps look good.  He is having some problems working on extension due to pulling of the sutures but has more comfortable extension with sutures removed.  He can work on this he is happy with the surgical result and will follow-up as needed.

## 2019-02-20 ENCOUNTER — Encounter: Payer: Self-pay | Admitting: Orthopaedic Surgery

## 2019-03-18 ENCOUNTER — Other Ambulatory Visit: Payer: Self-pay

## 2019-03-18 ENCOUNTER — Ambulatory Visit (INDEPENDENT_AMBULATORY_CARE_PROVIDER_SITE_OTHER): Payer: PPO | Admitting: Family Medicine

## 2019-03-18 ENCOUNTER — Encounter: Payer: Self-pay | Admitting: Family Medicine

## 2019-03-18 VITALS — BP 134/88 | Ht 68.0 in | Wt 140.8 lb

## 2019-03-18 DIAGNOSIS — R1032 Left lower quadrant pain: Secondary | ICD-10-CM

## 2019-03-18 DIAGNOSIS — Z23 Encounter for immunization: Secondary | ICD-10-CM

## 2019-03-18 DIAGNOSIS — R634 Abnormal weight loss: Secondary | ICD-10-CM

## 2019-03-18 DIAGNOSIS — K5900 Constipation, unspecified: Secondary | ICD-10-CM

## 2019-03-18 DIAGNOSIS — J449 Chronic obstructive pulmonary disease, unspecified: Secondary | ICD-10-CM | POA: Diagnosis not present

## 2019-03-18 MED ORDER — ALBUTEROL SULFATE HFA 108 (90 BASE) MCG/ACT IN AERS: 2.0000 | INHALATION_SPRAY | RESPIRATORY_TRACT | 3 refills | Status: DC | PRN

## 2019-03-18 MED ORDER — BUDESONIDE-FORMOTEROL FUMARATE 160-4.5 MCG/ACT IN AERO: INHALATION_SPRAY | RESPIRATORY_TRACT | 3 refills | Status: DC

## 2019-03-18 NOTE — Progress Notes (Signed)
Subjective:    Patient ID: George Meyer, male    DOB: 27-Jan-1941, 78 y.o.   MRN: [id number]  Hyperlipidemia This is a chronic problem. Associated symptoms include shortness of breath. Pertinent negatives include no chest pain. Treatments tried: pravastatin 40mg .   Has lost 30 lbs from covid due to depression from being stuck in the house.  Patient is trying to eat relatively healthy but he does state he finds himself feeling inside a lot and feels somewhat down regarding COVID Needs rx for albuterol inhaler and symbicort wrote out due to pt getting these meds from [place].  I discussed with the patient how Cologuard is really not a good test for his situation currently Wants to discuss getting Cologuard done.   Review of Systems  Constitutional: Positive for unexpected weight change. Negative for diaphoresis and fatigue.  HENT: Negative for congestion and rhinorrhea.   Respiratory: Positive for shortness of breath. Negative for cough, choking and wheezing.   Cardiovascular: Negative for chest pain and leg swelling.  Gastrointestinal: Positive for abdominal pain, constipation and nausea. Negative for diarrhea.  Skin: Negative for color change and rash.  Neurological: Negative for dizziness and headaches.  Psychiatric/Behavioral: Negative for behavioral problems and confusion.       Objective:   Physical Exam Vitals signs reviewed.  Constitutional:      General: He is not in acute distress. HENT:     Head: Normocephalic and atraumatic.  Eyes:     General:        Right eye: No discharge.        Left eye: No discharge.  Neck:     Trachea: No tracheal deviation.  Cardiovascular:     Rate and Rhythm: Normal rate and regular rhythm.     Heart sounds: Normal heart sounds. No murmur.  Pulmonary:     Effort: Pulmonary effort is normal. No respiratory distress.     Breath sounds: Normal breath sounds.  Lymphadenopathy:     Cervical: No cervical adenopathy.  Skin:    General:  Skin is warm and dry.  Neurological:     Mental Status: He is alert.     Coordination: Coordination normal.  Psychiatric:        Behavior: Behavior normal.    Area of his tenderness in the left lower quadrant abdomen not severe       Assessment & Plan:  1. Need for vaccination Flu shot today - Flu Vaccine QUAD 6+ mos PF IM (Fluarix Quad PF)  2. Constipation, unspecified constipation type Having significant constipation along with left lower quadrant pain is very important we delineate this out he is also having intermittent pains discomfort in the left lower quadrant area that sometimes wake him up at night sometimes during the day no blood in the stool we will do a stool test for blood also a CAT scan - IFOBT POC (occult bld, rslt in office); Future - CT Abdomen Pelvis W Contrast  3. Left lower quadrant abdominal pain He has had this going on for several months progressive in addition to this also having weight loss because of constipation weight loss and left lower quadrant pain will do another CAT scan - CT Abdomen Pelvis W Contrast  4. COPD mixed type [facility]) He has severe COPD stage III gold criteria it is very important for the patient to do the best that he can at maintaining his inhalers staying away from all cigarette smoke and maintaining COVID precautions  5. Weight loss  Increase dietary as recommended patient not getting his usual to go food we talked about ways to boost his calories. - CT Abdomen Pelvis W Contrast   25 minutes was spent with the patient.  This statement verifies that 25 minutes was indeed spent with the patient.  More than 50% of this visit-total duration of the visit-was spent in counseling and coordination of care. The issues that the patient came in for today as reflected in the diagnosis (s) please refer to documentation for further details.

## 2019-03-20 ENCOUNTER — Telehealth: Payer: Self-pay | Admitting: Family Medicine

## 2019-03-20 DIAGNOSIS — R1032 Left lower quadrant pain: Secondary | ICD-10-CM

## 2019-03-20 NOTE — Telephone Encounter (Signed)
Per scheduler - pt will need BMP drawn (prefer to be done at [facility] & can be done 45 minutes before his CT scan) (CT is 04/02/2019 @ 2:00pm, pt needs to arrive at [facility] lab 1:15pm)  Please place order & let pt know  Pt was notified that he'll need to pick up oral contrast at least the day before his scan & to get his labs done at [facility]

## 2019-03-22 NOTE — Telephone Encounter (Signed)
Please order metabolic 7-run stat Diagnosis abdominal pain Test required before CT scan make sure patient aware

## 2019-03-23 NOTE — Telephone Encounter (Signed)
Pt returned call and verbalized understanding. Lab order placed up front so pt can pick it up and take it with him to [facility]

## 2019-03-23 NOTE — Telephone Encounter (Signed)
Lab order placed; left message to return call

## 2019-03-24 ENCOUNTER — Other Ambulatory Visit (INDEPENDENT_AMBULATORY_CARE_PROVIDER_SITE_OTHER): Payer: PPO | Admitting: *Deleted

## 2019-03-24 DIAGNOSIS — K5900 Constipation, unspecified: Secondary | ICD-10-CM | POA: Diagnosis not present

## 2019-03-24 LAB — IFOBT (OCCULT BLOOD): IFOBT: NEGATIVE

## 2019-03-26 ENCOUNTER — Encounter: Payer: Self-pay | Admitting: Family Medicine

## 2019-03-26 ENCOUNTER — Telehealth: Payer: Self-pay | Admitting: Family Medicine

## 2019-03-26 NOTE — Telephone Encounter (Signed)
Placed in reminder file for June 2021

## 2019-03-26 NOTE — Telephone Encounter (Signed)
No need to do a repeat CT scan at this point I recommend putting this into the reminder file for next June

## 2019-03-26 NOTE — Telephone Encounter (Signed)
Pt has reminder in reminder file to have CT chest in one year (due tomorrow). Pt had a CT chest lung cancer screening low dose done by Dr.Nester on 12/30/2018. Do we need to order anything for patient regarding this? Pt does have CT Abdomen scheduled for Oct 1. Please advise. Thank you

## 2019-03-27 ENCOUNTER — Other Ambulatory Visit (HOSPITAL_COMMUNITY)
Admission: RE | Admit: 2019-03-27 | Discharge: 2019-03-27 | Disposition: A | Discharge status: Home / Self Care | Payer: PPO | Source: Ambulatory Visit | Attending: Family Medicine | Admitting: Family Medicine

## 2019-03-27 DIAGNOSIS — R1032 Left lower quadrant pain: Secondary | ICD-10-CM | POA: Diagnosis not present

## 2019-03-27 LAB — BASIC METABOLIC PANEL
Anion gap: 7 (ref 5–15)
BUN: 8 mg/dL (ref 8–23)
CO2: 30 mmol/L (ref 22–32)
Calcium: 8.8 mg/dL — ABNORMAL LOW (ref 8.9–10.3)
Chloride: 102 mmol/L (ref 98–111)
Creatinine, Ser: 0.72 mg/dL (ref 0.61–1.24)
GFR calc Af Amer: >60 mL/min (ref >60)
GFR calc non Af Amer: >60 mL/min (ref >60)
Glucose, Bld: 89 mg/dL (ref 70–99)
Potassium: 4.3 mmol/L (ref 3.5–5.1)
Sodium: 139 mmol/L (ref 135–145)

## 2019-03-28 ENCOUNTER — Encounter: Payer: Self-pay | Admitting: Family Medicine

## 2019-04-02 ENCOUNTER — Other Ambulatory Visit: Payer: Self-pay

## 2019-04-02 ENCOUNTER — Ambulatory Visit (HOSPITAL_COMMUNITY): Admission: RE | Admit: 2019-04-02 | Payer: PPO | Source: Ambulatory Visit

## 2019-04-14 ENCOUNTER — Telehealth: Payer: Self-pay | Admitting: Family Medicine

## 2019-04-14 NOTE — Telephone Encounter (Signed)
Pt contacted and verbalized understanding. Pt states that he would get this from pharmacy and if he needed a script he would give [place] a call.

## 2019-04-14 NOTE — Telephone Encounter (Signed)
Another option would be Nicorette gum He could start off using 4 mg gum essentially he would use it several times a day to try to suppress the urge to smoke Typically one would chew the gum several times to get it wet and then talking in their gum/cheek region-typically will give a tingling sensation for a while when that fades away chew the gum a few more times then re tuck it Over time he would gradually taper down how much gum he is using

## 2019-04-14 NOTE — Telephone Encounter (Signed)
Please advise. Thank you

## 2019-04-14 NOTE — Telephone Encounter (Signed)
Patient is trying really hard to stop smoking.  He went out and bought 50 dollars worth of patches and they caused blisters every where he put them so the pharmacist told him to not use them anymore and to call us. What else can he do or is there something we can call in for him?  He said he is determined to quit.  [facility]

## 2019-04-16 ENCOUNTER — Other Ambulatory Visit: Payer: Self-pay | Admitting: Family Medicine

## 2019-04-16 ENCOUNTER — Other Ambulatory Visit: Payer: Self-pay

## 2019-04-16 DIAGNOSIS — Z20828 Contact with and (suspected) exposure to other viral communicable diseases: Secondary | ICD-10-CM | POA: Diagnosis not present

## 2019-04-16 DIAGNOSIS — Z20822 Contact with and (suspected) exposure to covid-19: Secondary | ICD-10-CM

## 2019-04-18 LAB — NOVEL CORONAVIRUS, NAA: SARS-CoV-2, NAA: NOT DETECTED

## 2019-04-20 ENCOUNTER — Telehealth: Payer: Self-pay | Admitting: Hematology

## 2019-04-20 ENCOUNTER — Ambulatory Visit (HOSPITAL_COMMUNITY)
Admission: RE | Admit: 2019-04-20 | Discharge: 2019-04-20 | Disposition: A | Discharge status: Home / Self Care | Payer: PPO | Source: Ambulatory Visit | Attending: Family Medicine | Admitting: Family Medicine

## 2019-04-20 ENCOUNTER — Other Ambulatory Visit: Payer: Self-pay

## 2019-04-20 ENCOUNTER — Ambulatory Visit (INDEPENDENT_AMBULATORY_CARE_PROVIDER_SITE_OTHER): Payer: PPO | Admitting: Family Medicine

## 2019-04-20 DIAGNOSIS — J441 Chronic obstructive pulmonary disease with (acute) exacerbation: Secondary | ICD-10-CM | POA: Diagnosis not present

## 2019-04-20 DIAGNOSIS — R1032 Left lower quadrant pain: Secondary | ICD-10-CM | POA: Insufficient documentation

## 2019-04-20 DIAGNOSIS — K5900 Constipation, unspecified: Secondary | ICD-10-CM | POA: Diagnosis not present

## 2019-04-20 DIAGNOSIS — R634 Abnormal weight loss: Secondary | ICD-10-CM | POA: Insufficient documentation

## 2019-04-20 DIAGNOSIS — J449 Chronic obstructive pulmonary disease, unspecified: Secondary | ICD-10-CM

## 2019-04-20 MED ORDER — PREDNISONE 20 MG PO TABS: ORAL_TABLET | ORAL | 0 refills | Status: DC

## 2019-04-20 MED ORDER — IOHEXOL 300 MG/ML  SOLN
100.0000 mL | Freq: Once | INTRAMUSCULAR | Status: AC | PRN
  Administered 2019-04-20: 100 mL via INTRAVENOUS

## 2019-04-20 MED ORDER — AMOXICILLIN 500 MG PO CAPS: 500.0000 mg | ORAL_CAPSULE | Freq: Three times a day (TID) | ORAL | 0 refills | Status: DC

## 2019-04-20 NOTE — Progress Notes (Signed)
   Subjective:    Patient ID: George Meyer, male    DOB: 1940/09/12, 78 y.o.   MRN: [id number]  Cough This is a new problem. The current episode started in the past 7 days. Associated symptoms include nasal congestion.   Patient went to take covid test when it started and got results today that are neagtive Patient also had some prednisone and amoxil that he had left over from a previous COPD flare and it has helped but he needs more to get rid of it  Review of Systems  Respiratory: Positive for cough.    Virtual Visit via Video Note  I connected with [name] on 04/20/19 at  1:10 PM EDT by a video enabled telemedicine application and verified that I am speaking with the correct person using two identifiers.  Location: Patient: home Provider: office   I discussed the limitations of evaluation and management by telemedicine and the availability of in person appointments. The patient expressed understanding and agreed to proceed.  History of Present Illness:    Observations/Objective:   Assessment and Plan:   Follow Up Instructions:    I discussed the assessment and treatment plan with the patient. The patient was provided an opportunity to ask questions and all were answered. The patient agreed with the plan and demonstrated an understanding of the instructions.   The patient was advised to call back or seek an in-person evaluation if the symptoms worsen or if the condition fails to improve as anticipated.  I provided 18 minutes of non-face-to-face time during this encounter. Known history of COPD.  Gets similar spells like this.  Bronchial cough.  Productive at times.  No fever.  Significant increase of wheezing       Objective:   Physical Exam  Virtual      Assessment & Plan:  Impression exacerbation of COPD plan prednisone taper.  Albuterol 2 sprays every 4 to 6 as needed.  Amoxicillin 500 3 times daily 10 days.  Warning signs discussed.  Of note  patient had COVID-19 test which is negative

## 2019-04-20 NOTE — Telephone Encounter (Signed)
Pt is aware covid 19 result is negative

## 2019-05-05 ENCOUNTER — Telehealth: Payer: Self-pay | Admitting: Family Medicine

## 2019-05-05 MED ORDER — CHANTIX STARTING MONTH PAK 0.5 MG X 11 & 1 MG X 42 PO TABS: ORAL_TABLET | ORAL | 0 refills | Status: DC

## 2019-05-05 MED ORDER — VARENICLINE TARTRATE 1 MG PO TABS: ORAL_TABLET | ORAL | 2 refills | Status: DC

## 2019-05-05 NOTE — Telephone Encounter (Signed)
Patient is requesting a prescription for Chantix to help him stop smoking called into [facility]

## 2019-05-05 NOTE — Telephone Encounter (Signed)
Pt states that since he has been on Trazodone, he has been handling the Chantix well. Pt states he believes he will do fine. Sent Chantix to [facility] in [place]. Pt is aware to call us if any terrible side effects.

## 2019-05-05 NOTE — Telephone Encounter (Signed)
Chantix is a medication that can reduce the urge to smoke.  It is very effective for many individuals.  In a small number of people it can cause issues with depression.  In a very small number of people and can even increase the risk of suicidal ideation.  About 10% of people do not tolerate the medication because of nausea or severe vivid dreams.  It is very important for the patient to be aware of these as potential side effects before making a decision.   If he chooses to start the medicine then please instruct the following-if the medication makes him depressed he should stop the medicine.   Starter pack-1 first month Maintenance pack-2 the following 2 months  Obviously if medication makes him feel depressed before other terrible side effects stop the medicine right away and notify us

## 2019-06-02 ENCOUNTER — Telehealth: Payer: Self-pay | Admitting: Family Medicine

## 2019-06-02 ENCOUNTER — Other Ambulatory Visit: Payer: Self-pay | Admitting: *Deleted

## 2019-06-02 MED ORDER — DORZOLAMIDE HCL-TIMOLOL MAL 2-0.5 % OP SOLN: 1.0000 [drp] | Freq: Two times a day (BID) | OPHTHALMIC | 0 refills | Status: AC

## 2019-06-02 NOTE — Telephone Encounter (Signed)
George Meyer Please go ahead and send in a 1 month supply of this thank you

## 2019-06-02 NOTE — Telephone Encounter (Signed)
Pt would like to speak to nurse concerning one of his medicaitons and not being able to get refills. He didn't tell me what medication.   CB# [phone]

## 2019-06-02 NOTE — Telephone Encounter (Signed)
Refill sent to pharm. Pt notified  

## 2019-06-02 NOTE — Telephone Encounter (Signed)
Pt is in [place] and states he cannot get his eye drops sent in since he has not been seen due to covid. He will come back to [place] on the 28th and wants to know if dr scott will call in the drops. He has been taking them for years. cosopt one drop both eyes bid. He asked if he could do just one month to get him to his appt. [facility] [place] and then he will have it transferred to [facility] in [place].

## 2019-06-09 ENCOUNTER — Other Ambulatory Visit: Payer: Self-pay | Admitting: Family Medicine

## 2019-06-10 ENCOUNTER — Telehealth: Payer: Self-pay | Admitting: Family Medicine

## 2019-06-10 NOTE — Telephone Encounter (Signed)
Left message to return call to get information on reason needing refill of prednisone

## 2019-06-10 NOTE — Telephone Encounter (Signed)
Pt called to check on the refill request that we received from [facility] for his predniSONE (DELTASONE) 20 MG tablet   Please advise & call pt

## 2019-06-11 IMAGING — CT CT NECK W/ CM
3 of 4 series · 13 of 33 positions shown, 16 images · IV contrast (iopamidol)
Comparison: Neck CT 01/12/2014.

CLINICAL DATA: 76-year-old male with small mass discovered 2 weeks
ago under the left mandible.

EXAM:
CT NECK WITH CONTRAST
TECHNIQUE: Multidetector CT imaging of the neck was performed using the
standard protocol following the bolus administration of intravenous
contrast.
CONTRAST:  75mL T14MT9-M88 IOPAMIDOL (T14MT9-M88) INJECTION 61%

[Series 2: axial neck · axial · 0.60mm/px · z∈[+1459,+1627]mm · 5 of 126 slices shown, 7 images]
[im 21/126  soft-tissue]
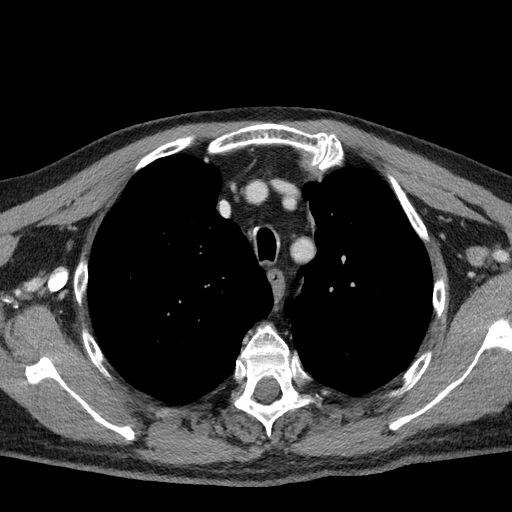
[im 21/126  bone]
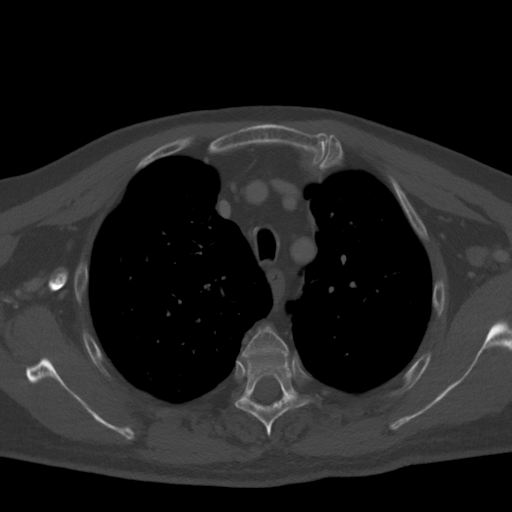
[im 42/126  bone]
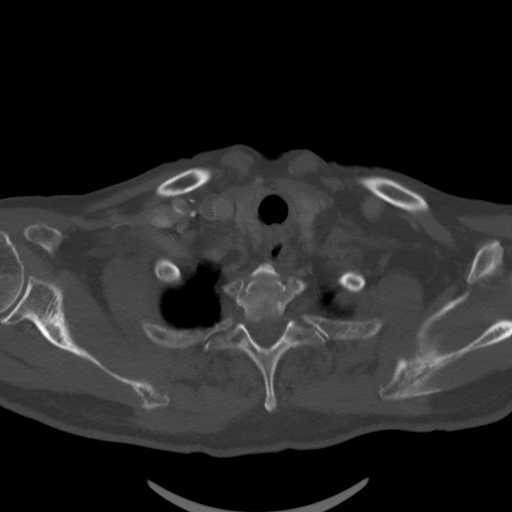
[im 63/126  bone]
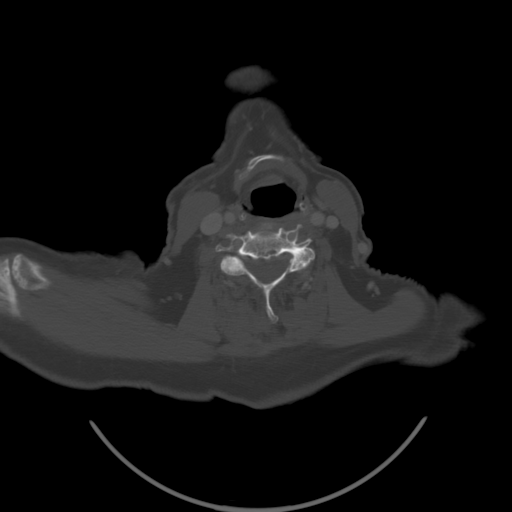
[im 84/126  bone]
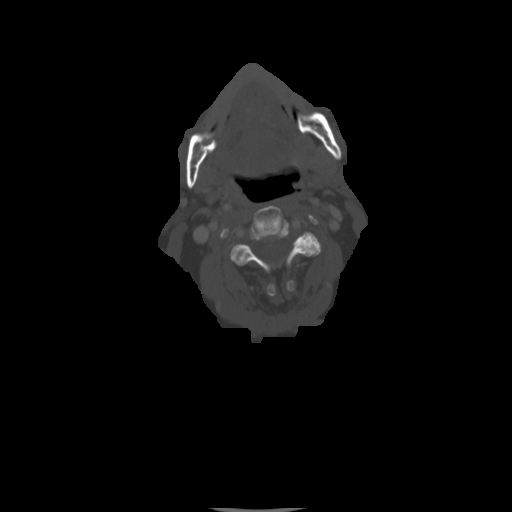
[im 105/126  soft-tissue]
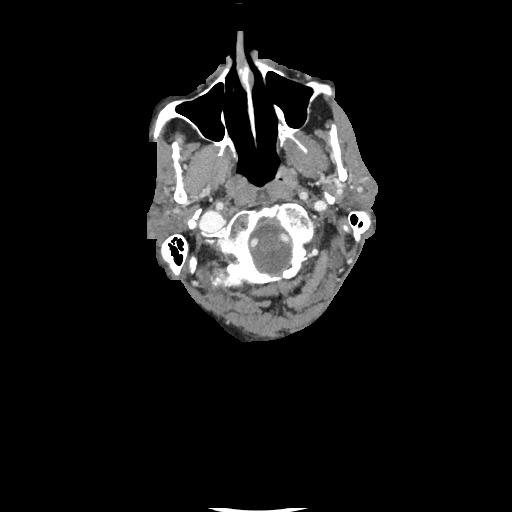
[im 105/126  bone]
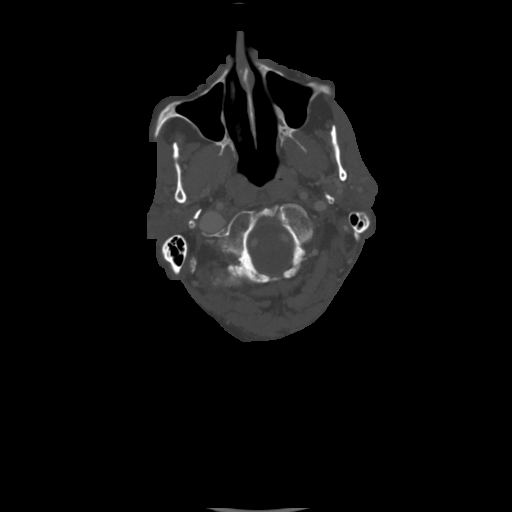

[Series 6: coronal neck · coronal · 0.45mm/px · 3 of 123 slices shown]
[im 39/123  bone]
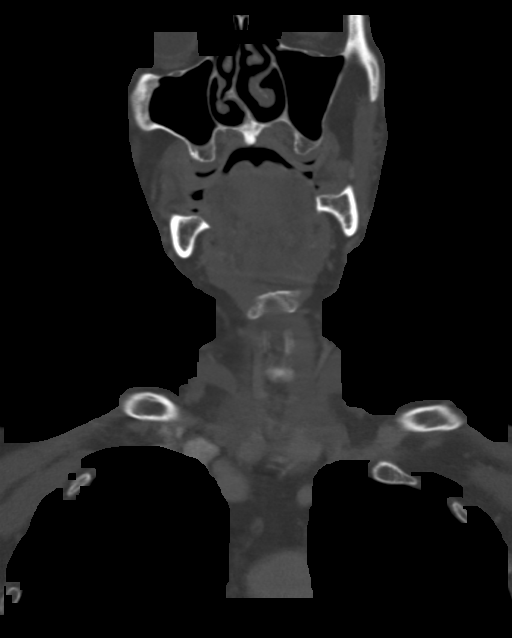
[im 52/123  bone]
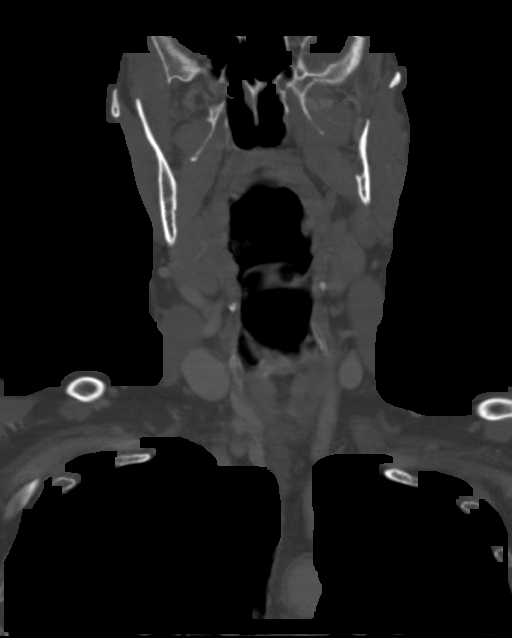
[im 65/123  bone]
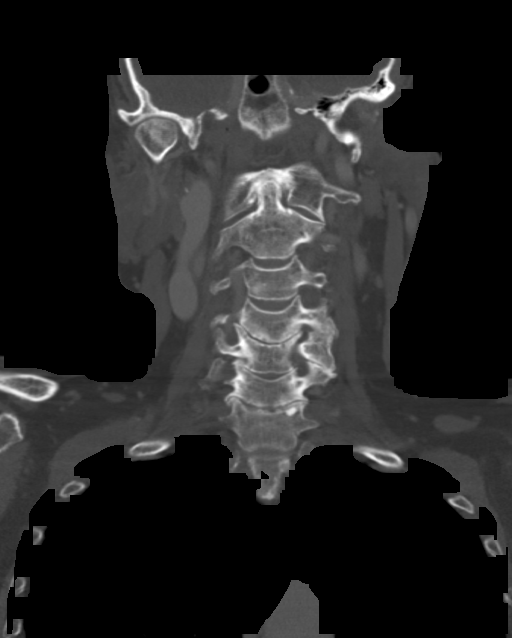

[Series 7: sagittal neck · sagittal · 0.49mm/px · 5 of 101 slices shown, 6 images]
[im 34/101  bone]
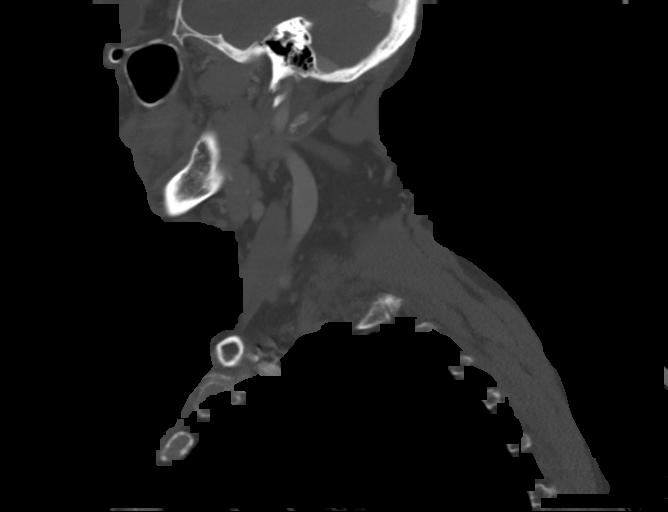
[im 42/101  bone]
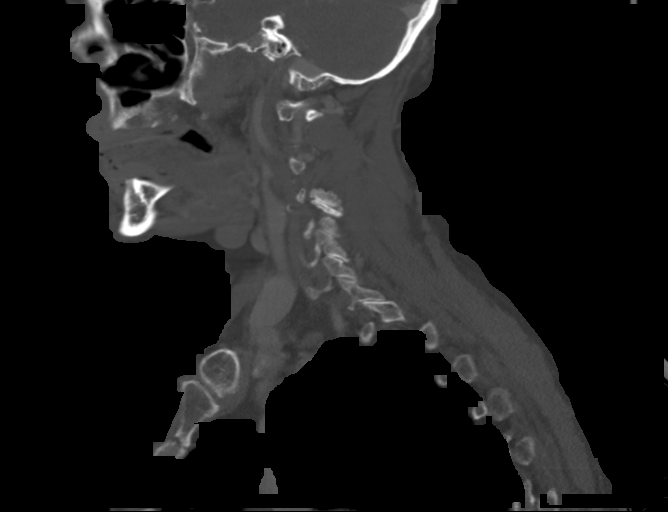
[im 51/101  soft-tissue]
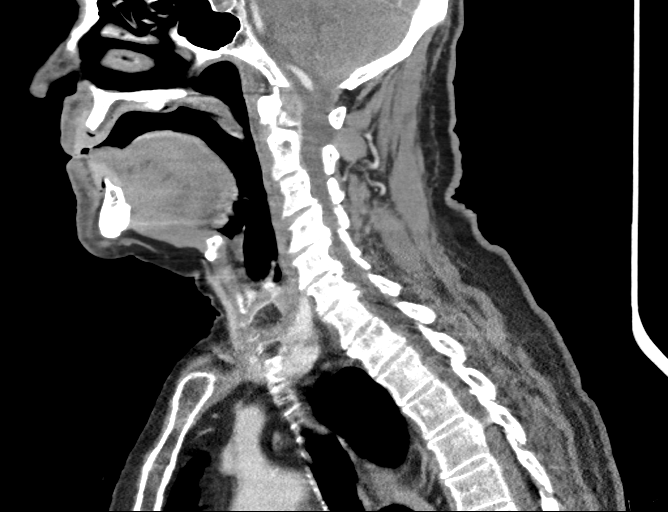
[im 51/101  bone]
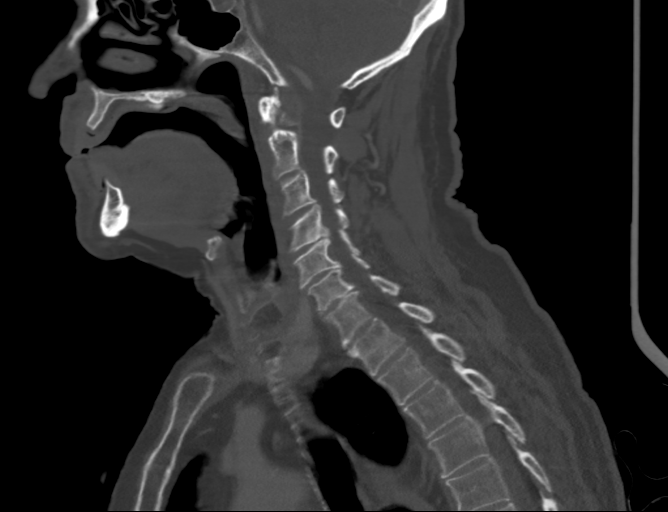
[im 59/101  bone]
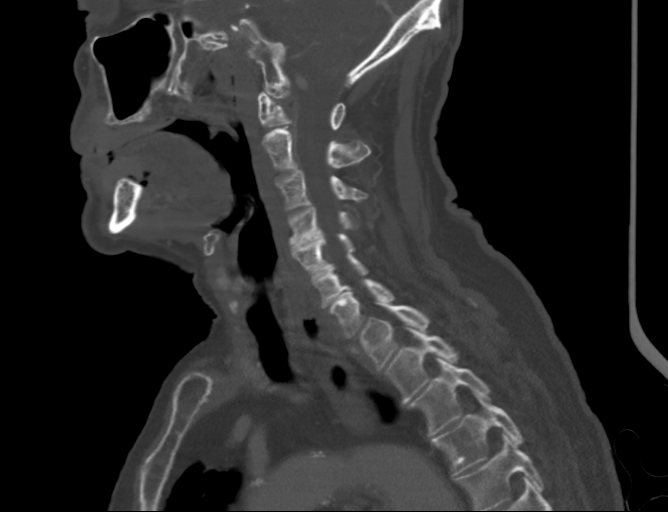
[im 67/101  bone]
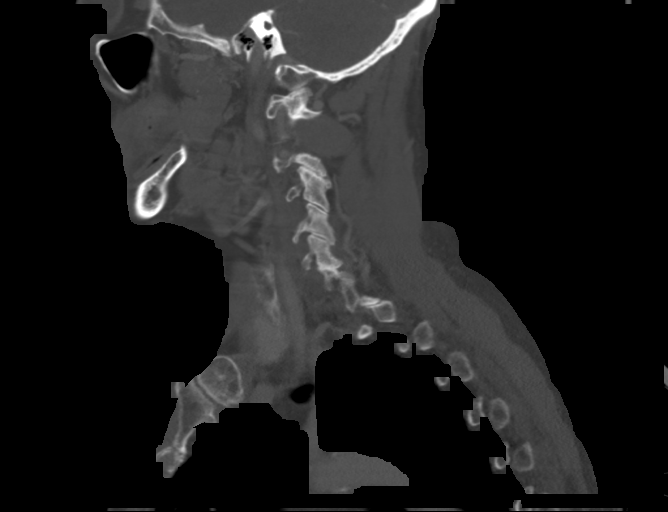

[13 of 33 positions shown; findings below may reference images not displayed]

FINDINGS: Pharynx and larynx: Motion artifact at the epiglottis. Laryngeal and
pharyngeal soft tissue contours are otherwise within normal limits.
Negative parapharyngeal and retropharyngeal spaces.

Salivary glands: Palpable area of concern marked in the left
submandibular region on series 2, images 54 through 57. The left
submandibular gland underlies the skin markers, but appears stable
since 8919 and within normal limits. The left submandibular gland is
chronically mildly larger than the right, but stable. The sublingual
and bilateral submandibular spaces appears stable and within normal
limits. No sialolithiasis identified.

Bilateral parotid glands also appears stable and within normal
limits.

Thyroid: Negative.

Lymph nodes: Negative.  No lymphadenopathy.

Vascular: Bilateral calcified carotid bifurcation atherosclerosis.
Major vascular structures in the neck and at the skullbase remain
patent. Bilateral ICA siphon calcified plaque.

Limited intracranial: Negative.

Visualized orbits: Stable an negative.

Mastoids and visualized paranasal sinuses: Visualized paranasal
sinuses and mastoids are stable and well pneumatized.

Skeleton: Absent dentition. Degenerative changes in the cervical
spine. Osteopenia. No acute or suspicious osseous lesion.

Upper chest: Centrilobular emphysema. Mild right apical bulla. Small
right upper lobe lung nodule on series 5, image 121 is stable (mild
motion artifact here on the 8919 study but the nodule was evident on
series 3, image 129). Mild peribronchial thickening. No axillary or
superior mediastinal lymphadenopathy. Calcified aortic
atherosclerosis.
IMPRESSION: 1. Palpable area of concern corresponds to the left submandibular
gland which appears stable since 8919 and within normal limits.
2. Stable since 8919 and negative CT appearance of the neck. No neck
mass or lymphadenopathy.
3. Emphysema. A small stable right upper lobe lung nodule is
benign/postinflammatory.
4.  Calcified aortic atherosclerosis.

## 2019-06-11 NOTE — Telephone Encounter (Signed)
Left message to return call 

## 2019-06-16 MED ORDER — PREDNISONE 20 MG PO TABS: ORAL_TABLET | ORAL | 0 refills | Status: DC

## 2019-06-16 NOTE — Telephone Encounter (Signed)
Medication refill sent in and pt is aware. Pt aware to do a virtual visit when COPD flares up and to only take med when he has a flareup of COPD. Pt verbalized understanding.

## 2019-06-16 NOTE — Telephone Encounter (Signed)
He may have a refill of the prednisone No. 18 with a tapering dose Please instruct the patient though that he should only start this medicine if he is having a flareup of his COPD with coughing and shortness of breath Ideally the patient should do a virtual visit with [place] when he has a flareup This would be covered by his insurance This prescription is more so to provide him safe treatment should it be a situation where we are not available such as a weekend or holiday Certainly if patient is ever having trouble he can call us  Plus also encourage regular visits even virtual or over the phone if having any flareups

## 2019-06-16 NOTE — Telephone Encounter (Signed)
Pt states the PCP would like him to keep it on hand due to COPD. Pt states that provider said that any time he starts to take a cold, to start on Prednisone. Please advise. Thank you

## 2019-07-10 ENCOUNTER — Telehealth: Payer: Self-pay | Admitting: Family Medicine

## 2019-07-10 MED ORDER — TRAZODONE HCL 100 MG PO TABS: ORAL_TABLET | ORAL | 1 refills | Status: DC

## 2019-07-10 NOTE — Telephone Encounter (Signed)
Pt last seen 04/13/2019. Please advise. Thank you

## 2019-07-10 NOTE — Telephone Encounter (Signed)
Medication sent to pharmacy. Left message to return call 

## 2019-07-10 NOTE — Telephone Encounter (Signed)
Patient is requesting refill on trazodone 100mg  called into [facility] [place]

## 2019-07-10 NOTE — Telephone Encounter (Signed)
May have 6 months worth of refills

## 2019-07-13 NOTE — Telephone Encounter (Signed)
Pt.notified

## 2019-07-30 ENCOUNTER — Ambulatory Visit (INDEPENDENT_AMBULATORY_CARE_PROVIDER_SITE_OTHER): Payer: PPO | Admitting: Family Medicine

## 2019-07-30 DIAGNOSIS — E782 Mixed hyperlipidemia: Secondary | ICD-10-CM

## 2019-07-30 DIAGNOSIS — J449 Chronic obstructive pulmonary disease, unspecified: Secondary | ICD-10-CM | POA: Diagnosis not present

## 2019-07-30 DIAGNOSIS — R634 Abnormal weight loss: Secondary | ICD-10-CM | POA: Diagnosis not present

## 2019-07-30 DIAGNOSIS — Z125 Encounter for screening for malignant neoplasm of prostate: Secondary | ICD-10-CM | POA: Diagnosis not present

## 2019-07-30 DIAGNOSIS — R6881 Early satiety: Secondary | ICD-10-CM | POA: Diagnosis not present

## 2019-07-30 DIAGNOSIS — D72820 Lymphocytosis (symptomatic): Secondary | ICD-10-CM

## 2019-07-30 NOTE — Progress Notes (Signed)
   Subjective:    Patient ID: George Meyer, male    DOB: 12-19-1940, 79 y.o.   MRN: [id number]  HPIUnintentional weight loss. States he has lost about 35 lbs in the past year without changing anything.   Virtual Visit via Telephone Note  I connected with [name] on 07/30/19 at  2:00 PM EST by telephone and verified that I am speaking with the correct person using two identifiers.  Location: Patient: home Provider: office   I discussed the limitations, risks, security and privacy concerns of performing an evaluation and management service by telephone and the availability of in person appointments. I also discussed with the patient that there may be a patient responsible charge related to this service. The patient expressed understanding and agreed to proceed.   History of Present Illness:    Observations/Objective:   Assessment and Plan:   Follow Up Instructions:    I discussed the assessment and treatment plan with the patient. The patient was provided an opportunity to ask questions and all were answered. The patient agreed with the plan and demonstrated an understanding of the instructions.   The patient was advised to call back or seek an in-person evaluation if the symptoms worsen or if the condition fails to improve as anticipated.  I provided 30 minutes of non-face-to-face time during this encounter.  Patient denies sweats chills denies wheezing states has his usual shortness of breath related to COPD energy level fair denies being depressed denies rectal bleeding denies hematuria denies constipation abdominal pain or chest pains.  No headaches.    Review of Systems  Constitutional: Positive for unexpected weight change. Negative for diaphoresis and fatigue.  HENT: Negative for congestion and rhinorrhea.   Respiratory: Negative for cough and shortness of breath.   Cardiovascular: Negative for chest pain and leg swelling.  Gastrointestinal: Negative for  abdominal pain and diarrhea.  Skin: Negative for color change and rash.  Neurological: Negative for dizziness and headaches.  Psychiatric/Behavioral: Negative for behavioral problems and confusion.       Objective:   Physical Exam  Today's visit was via telephone Physical exam was not possible for this visit       Assessment & Plan:  Significant weight loss over the past several months.  Partly related to early satiety.  Probably related to just not feeling up to eating.  Denies being depressed.  Does have COPD.  Certainly this could be playing a role in his nose lack of appetite.  This could also be playing a role in his weight loss but we need to rule out more other serious issues therefore do lab work more than likely referral to gastroenterology for possible EGD due to early satiety patient does have a history of leukocytosis not severe followed by oncology  30 minutes spent reviewing records face-to-face evaluation ordering the test saying dictations

## 2019-07-31 ENCOUNTER — Other Ambulatory Visit: Payer: Self-pay

## 2019-07-31 ENCOUNTER — Telehealth: Payer: Self-pay | Admitting: Family Medicine

## 2019-07-31 ENCOUNTER — Ambulatory Visit (HOSPITAL_COMMUNITY)
Admission: RE | Admit: 2019-07-31 | Discharge: 2019-07-31 | Disposition: A | Discharge status: Home / Self Care | Payer: PPO | Source: Ambulatory Visit | Attending: Family Medicine | Admitting: Family Medicine

## 2019-07-31 DIAGNOSIS — J449 Chronic obstructive pulmonary disease, unspecified: Secondary | ICD-10-CM | POA: Insufficient documentation

## 2019-07-31 DIAGNOSIS — E782 Mixed hyperlipidemia: Secondary | ICD-10-CM | POA: Insufficient documentation

## 2019-07-31 DIAGNOSIS — R634 Abnormal weight loss: Secondary | ICD-10-CM | POA: Insufficient documentation

## 2019-07-31 DIAGNOSIS — R05 Cough: Secondary | ICD-10-CM | POA: Diagnosis not present

## 2019-07-31 DIAGNOSIS — Z125 Encounter for screening for malignant neoplasm of prostate: Secondary | ICD-10-CM | POA: Diagnosis not present

## 2019-07-31 DIAGNOSIS — D72820 Lymphocytosis (symptomatic): Secondary | ICD-10-CM | POA: Insufficient documentation

## 2019-07-31 NOTE — Telephone Encounter (Signed)
Radiology assistant contacted office to inform us patient final impression on DG Chest 2 View that was completed today. Please advise. Thank you

## 2019-08-01 LAB — COMPREHENSIVE METABOLIC PANEL
ALT: 10 IU/L (ref 0–44)
AST: 16 IU/L (ref 0–40)
Albumin/Globulin Ratio: 2 (ref 1.2–2.2)
Albumin: 4.3 g/dL (ref 3.7–4.7)
Alkaline Phosphatase: 66 IU/L (ref 39–117)
BUN/Creatinine Ratio: 9 — ABNORMAL LOW (ref 10–24)
BUN: 8 mg/dL (ref 8–27)
Bilirubin Total: 0.5 mg/dL (ref 0.0–1.2)
CO2: 29 mmol/L (ref 20–29)
Calcium: 9.5 mg/dL (ref 8.6–10.2)
Chloride: 98 mmol/L (ref 96–106)
Creatinine, Ser: 0.87 mg/dL (ref 0.76–1.27)
GFR calc Af Amer: 95 mL/min/{1.73_m2} (ref >59)
GFR calc non Af Amer: 82 mL/min/{1.73_m2} (ref >59)
Globulin, Total: 2.1 g/dL (ref 1.5–4.5)
Glucose: 87 mg/dL (ref 65–99)
Potassium: 4.5 mmol/L (ref 3.5–5.2)
Sodium: 140 mmol/L (ref 134–144)
Total Protein: 6.4 g/dL (ref 6.0–8.5)

## 2019-08-01 LAB — CBC WITH DIFFERENTIAL/PLATELET
Basophils Absolute: 0.1 10*3/uL (ref 0.0–0.2)
Basos: 1 %
EOS (ABSOLUTE): 1 10*3/uL — ABNORMAL HIGH (ref 0.0–0.4)
Eos: 9 %
Hematocrit: 50.6 % (ref 37.5–51.0)
Hemoglobin: 16.9 g/dL (ref 13.0–17.7)
Immature Grans (Abs): 0 10*3/uL (ref 0.0–0.1)
Immature Granulocytes: 0 %
Lymphocytes Absolute: 3 10*3/uL (ref 0.7–3.1)
Lymphs: 25 %
MCH: 31.8 pg (ref 26.6–33.0)
MCHC: 33.4 g/dL (ref 31.5–35.7)
MCV: 95 fL (ref 79–97)
Monocytes Absolute: 1 10*3/uL — ABNORMAL HIGH (ref 0.1–0.9)
Monocytes: 9 %
Neutrophils Absolute: 6.7 10*3/uL (ref 1.4–7.0)
Neutrophils: 56 %
Platelets: 382 10*3/uL (ref 150–450)
RBC: 5.32 x10E6/uL (ref 4.14–5.80)
RDW: 13.3 % (ref 11.6–15.4)
WBC: 11.8 10*3/uL — ABNORMAL HIGH (ref 3.4–10.8)

## 2019-08-01 LAB — PSA: Prostate Specific Ag, Serum: 1.1 ng/mL (ref 0.0–4.0)

## 2019-08-01 LAB — T4, FREE: Free T4: 1.29 ng/dL (ref 0.82–1.77)

## 2019-08-01 LAB — TSH: TSH: 0.951 u[IU]/mL (ref 0.450–4.500)

## 2019-08-01 LAB — LIPASE: Lipase: 72 U/L (ref 13–78)

## 2019-08-01 LAB — C-REACTIVE PROTEIN: CRP: 1 mg/L (ref 0–10)

## 2019-08-02 NOTE — Telephone Encounter (Signed)
See result note thank you 

## 2019-08-03 ENCOUNTER — Other Ambulatory Visit: Payer: Self-pay | Admitting: *Deleted

## 2019-08-03 ENCOUNTER — Other Ambulatory Visit (HOSPITAL_COMMUNITY): Payer: Self-pay | Admitting: Family Medicine

## 2019-08-03 ENCOUNTER — Encounter: Payer: Self-pay | Admitting: Internal Medicine

## 2019-08-03 DIAGNOSIS — R634 Abnormal weight loss: Secondary | ICD-10-CM

## 2019-08-03 DIAGNOSIS — R9389 Abnormal findings on diagnostic imaging of other specified body structures: Secondary | ICD-10-CM

## 2019-08-03 NOTE — Telephone Encounter (Signed)
Called and discussed results with pt. See result note.

## 2019-08-18 NOTE — Progress Notes (Signed)
Referring Provider: Kathyrn Drown, MD Primary Care Physician:  Kathyrn Drown, MD Primary Gastroenterologist:  Dr. Gala Romney  Chief Complaint  Patient presents with  . Weight Loss    HPI:   George Meyer is a 79 y.o. male presenting today at the request of Luking, Elayne Snare, MD for weight loss.  GI history significant for IBS predominantly constipation. Last seen in our office in 2011 to schedule TCS. Had recent LLQ pain and constipstion that resolved after taking clearlax.  Denied overt GI bleeding.  Found to be hemeoccult positive on rectal exam.  Plans to proceed with TCS.  TCS on 01/17/2010 with 1 polyp in the splenic flexure and 2 diminutive rectal polyps, otherwise normal exam.  Polyp at the splenic flexure was hyperplastic.  Pathology for rectal polyps revealed polypoid colorectal mucosa, no adenomatous change or malignancy.  Recommended repeat colonoscopy in 10 years.  Reviewed recent PCP note dated 07/30/2019.  Noted patient had significant weight loss over the past several months with early satiety.  Denies depression.  History of COPD that could be playing a role in weight loss but would need to rule out other serious causes.  Plan for laboratory evaluation and likely refer to GI for possible EGD.  Labs completed on 07/31/2019 with CBC essentially normal other than mildly elevated WBC at 11.8 which is chronic.  CMP essentially normal.  Lipase, PSA, TSH, free T4, C-reactive protein all within normal limits.  Referral was placed to GI and requested patient keep a food diary for the next 2 days.   Prior evaluation for weight loss, LLQ abdominal pain, and constipation in September/October 2020 with IFOBT negative and CT A/P without any acute findings.  Patient admitted to depression at that time secondary to having to stay home due to COVID-19 pandemic.  Today: Thinks weight loss is related to boredom. Started losing his appetite around March 2020 when having to stay home due to [place].  Normally has a sandwich for lunch and "who knows what at supper time." Doesn't eat breakfast due to no appetite. Lives alone. When he does eat, he feels like he gets full quickly. Early satiety has been present for about 3.5 months. No nausea or vomiting. No abdominal pain. No GERD symptoms. He has started drinking boost and meal replacements. Started those about 3 months ago. Feels he has lost 36 lbs since March 2020. This is according to his scale at home.   BMs every other day. No constipation or diarrhea. No blood in the stool. No black stools.   No fever, chills pre-syncope, or syncope. Went through some depression. Was placed on Trazadone. This helps him sleep. Still has lost of interest in doing things.  States he used to be very active in [employer] and also spoke to nursing homes regularly but all this has stopped due to Covid.  Only having church on Sunday mornings when they do have it.  States he is stuck at home twiddling his thumbs. No chest pain or heart palpitations. Chronic SOB with exertion. No SOB at rest. No supplemental oxygen. Not much of a cough. Feels COPD is stable over the last several years.   BP is elevated.  No headache or blurry vision.  Hasn't taken BP medication or any of his other medications this morning.   Wants to hold off on TCS.   Past Medical History:  Diagnosis Date  . Anxiety   . Cataracts, bilateral    removed by surgery  .  COPD (chronic obstructive pulmonary disease) ([place])   . Depression, major, single episode, moderate ([place]) 12/18/2018  . Glaucoma    both eyes  . Glaucoma    bilateral  . Hyperlipidemia   . Hypertension   . Pre-diabetes    diet controlled  . Skin cancer, basal cell    arms, face  . Wears dentures    full    Past Surgical History:  Procedure Laterality Date  . CARDIAC CATHETERIZATION N/A 12/2005   negative  . COLONOSCOPY N/A 12/2009   small hyperplastic polyp repeat in 10 years  . DUPUYTREN CONTRACTURE RELEASE Left 02/02/2019     Procedure: EXCISION LEFT HAND AND SMALL FINGER DUPUYTREN CONTRACTURE RELEASE;  Surgeon: Marybelle Killings, MD;  Location: [facility];  Service: Orthopedics;  Laterality: Left;  . EYE SURGERY Bilateral    remove cataracts  . HAND SURGERY Right 2010   dupuytrens excision right little finger  . TONSILLECTOMY    . WISDOM TOOTH EXTRACTION      Current Outpatient Medications  Medication Sig Dispense Refill  . albuterol (PROVENTIL) (2.5 MG/3ML) 0.083% nebulizer solution USE 1 VIAL IN NEBULIZER EVERY 4 HOURS AS NEEDED FOR WHEEZING (Patient taking differently: Take 2.5 mg by nebulization 2 (two) times a day. ) 300 mL 5  . albuterol (VENTOLIN HFA) 108 (90 Base) MCG/ACT inhaler Inhale 2 puffs into the lungs every 4 (four) hours as needed. 54 g 3  . ALPRAZolam (XANAX) 0.5 MG tablet TAKE 1 TABLET BY MOUTH AT BEDTIME AS NEEDED FOR ANXIETY 30 tablet 5  . aspirin 81 MG tablet Take 81 mg by mouth daily.    . budesonide-formoterol (SYMBICORT) 160-4.5 MCG/ACT inhaler INHALE TWO PUFFS BY MOUTH TWICE DAILY ** STOP SPIRIVA ** 3 Inhaler 3  . dorzolamide-timolol (COSOPT) 22.3-6.8 MG/ML ophthalmic solution Place 1 drop into both eyes 2 (two) times daily. 10 mL 0  . latanoprost (XALATAN) 0.005 % ophthalmic solution Place 1 drop into both eyes at bedtime.     Marland Kitchen lisinopril (ZESTRIL) 5 MG tablet Take 1 tablet (5 mg total) by mouth daily. 90 tablet 1  . nitroGLYCERIN (NITROSTAT) 0.4 MG SL tablet 1 sl q 5 min prn chest pressure as directed (Patient taking differently: Place 0.4 mg under the tongue every 5 (five) minutes as needed for chest pain. 1 sl q 5 min prn chest pressure as directed) 50 tablet 3  . Omega-3 1000 MG CAPS Take 1,000 mg by mouth daily.    . pravastatin (PRAVACHOL) 40 MG tablet Take 1 tablet (40 mg total) by mouth daily. 90 tablet 1  . SAW PALMETTO, SERENOA REPENS, PO Take 1 capsule by mouth daily.     . traZODone (DESYREL) 100 MG tablet 1 qhs 90 tablet 1   No current facility-administered medications for this  visit.    Allergies as of 08/19/2019 - Review Complete 08/19/2019  Allergen Reaction Noted  . Amlodipine Itching 12/21/2013  . Chlorzoxazone Itching 03/18/2019    Family History  Problem Relation Age of Onset  . Heart attack Father   . Heart attack Mother   . Lung cancer Brother   . Colon cancer Neg Hx     Social History   Socioeconomic History  . Marital status: Single    Spouse name: Not on file  . Number of children: Not on file  . Years of education: Not on file  . Highest education level: Not on file  Occupational History  . Not on file  Tobacco Use  .  Smoking status: Current Every Day Smoker    Packs/day: 0.50    Years: 66.00    Pack years: 33.00    Types: Cigarettes  . Smokeless tobacco: Never Used  . Tobacco comment: started smoking age 26  Substance and Sexual Activity  . Alcohol use: No  . Drug use: No  . Sexual activity: Yes    Partners: Female  Other Topics Concern  . Not on file  Social History Narrative  . Not on file   Social Determinants of Health   Financial Resource Strain:   . Difficulty of Paying Living Expenses: Not on file  Food Insecurity:   . Worried About Charity fundraiser in the Last Year: Not on file  . Ran Out of Food in the Last Year: Not on file  Transportation Needs:   . Lack of Transportation (Medical): Not on file  . Lack of Transportation (Non-Medical): Not on file  Physical Activity:   . Days of Exercise per Week: Not on file  . Minutes of Exercise per Session: Not on file  Stress:   . Feeling of Stress : Not on file  Social Connections:   . Frequency of Communication with Friends and Family: Not on file  . Frequency of Social Gatherings with Friends and Family: Not on file  . Attends Religious Services: Not on file  . Active Member of Clubs or Organizations: Not on file  . Attends Archivist Meetings: Not on file  . Marital Status: Not on file  Intimate Partner Violence:   . Fear of Current or  Ex-Partner: Not on file  . Emotionally Abused: Not on file  . Physically Abused: Not on file  . Sexually Abused: Not on file    Review of Systems: Gen: See HPI CV: See HPI Resp: See HPI GI: See HPI GU : Denies urinary burning, urinary frequency, or hematuria. MS: Denies joint pain.  Rare ibuprofen use. Derm: Denies rash. Psych: See HPI Heme: Denies bruising or bleeding  Physical Exam: BP (!) 185/91   Pulse 71   Temp (!) 96.8 F (36 C) (Temporal)   Ht 5\' 8"  (1.727 m)   Wt 136 lb 12.8 oz (62.1 kg)   BMI 20.80 kg/m  General:   Alert and oriented. Pleasant and cooperative. Well-nourished and well-developed.  Appears stated age.  Head:  Normocephalic and atraumatic. Eyes:  Without icterus, sclera clear and conjunctiva pink.  Ears:  Normal auditory acuity. Lungs:  Minimal expiratory rhonchi. No wheezes or rales. No distress.  Heart:  S1, S2 present without murmurs appreciated.  Abdomen:  +BS, soft, non-tender and non-distended. No HSM noted. No guarding or rebound. No masses appreciated.  Rectal:  Deferred  Msk:  Symmetrical without gross deformities. Normal posture. Extremities:  Without edema. Neurologic:  Alert and oriented x4;  grossly normal neurologically. Skin:  Intact without significant lesions or rashes. Psych: Normal mood and affect.

## 2019-08-19 ENCOUNTER — Other Ambulatory Visit: Payer: Self-pay

## 2019-08-19 ENCOUNTER — Ambulatory Visit: Payer: PPO | Admitting: Gastroenterology

## 2019-08-19 ENCOUNTER — Encounter: Payer: Self-pay | Admitting: Gastroenterology

## 2019-08-19 ENCOUNTER — Telehealth: Payer: Self-pay

## 2019-08-19 DIAGNOSIS — R6881 Early satiety: Secondary | ICD-10-CM | POA: Insufficient documentation

## 2019-08-19 DIAGNOSIS — R634 Abnormal weight loss: Secondary | ICD-10-CM | POA: Insufficient documentation

## 2019-08-19 DIAGNOSIS — I10 Essential (primary) hypertension: Secondary | ICD-10-CM | POA: Diagnosis not present

## 2019-08-19 NOTE — Telephone Encounter (Signed)
Called pt, EGD w/Propofol w/RMR scheduled for 11/12/19 at 8:00am. Orders entered.

## 2019-08-19 NOTE — Patient Instructions (Addendum)
Please take your blood pressure medications as soon as you get home.   Please discuss loss of interest in doing things with Dr. Doristine Section.   Continue boost and meal supplements.   We will get you scheduled for an upper endoscopy with Dr. Gala Romney in the near future.   We will follow-up with you in the office after your procedure. Call if you have questions or concerns prior.   Aliene Altes, PA-C [facility]

## 2019-08-21 ENCOUNTER — Encounter: Payer: Self-pay | Admitting: Gastroenterology

## 2019-08-21 NOTE — Assessment & Plan Note (Addendum)
Addressed under loss of weight 

## 2019-08-21 NOTE — Assessment & Plan Note (Signed)
BP quite elevated today at 185/91. Asymptomatic. Patient reports he hasn't taken any of his medications this morning. He was advised to go straight home and take his medications. If BP doesn't come down or if he developed chest pain, shortness, of breath, headache, vision changes he should proceed to the ED. He voiced his understanding.

## 2019-08-21 NOTE — Assessment & Plan Note (Signed)
79 year old male with history of COPD, HLD, HTN, prediabetes, anxiety, and apparent single episode of depression in June 2020 who was referred for weight loss and early satiety.  Patient reports weight loss of 36 pounds since March 2020 which he feels is related to "boredom" and loss of appetite. Chart documented weight loss of 7 lbs in the last 7 months. Now with early satiety for the last 3.5 months. Denies any other significant upper or lower GI symptoms.  No bright red blood per rectum or melena.  Prior evaluation with PCP included negative IFOBT in September 2020, CT A/P without acute findings in October 2020, labs in January 2020 with slight chronic elevation of WBC, otherwise CBC, CMP, lipase, PSA, TSH, free T4, C-reactive protein within normal limits.  Last colonoscopy in 2011 with hyperplastic polyp due for repeat at this time.  Patients weight loss is most likely secondary to depression which needs better management as he reports loss of interest in many activities and no longer able to engage in prior activities that gave his life purpose due to COVID-19 pandemic. However, with new onset of early satiety and weight loss, we will pursue EGD to rule out other serious cause including malignancy. Discussed updating colonoscopy and patient preferred not pursuing at this time.   Proceed with EGD with propofol with Dr. Gala Romney in the near future. The risks, benefits, and alternatives have been discussed in detail with patient. They have stated understanding and desire to proceed.  Propofol due to xanax and trazodone.  Follow-up with Dr. Wolfgang Phoenix on depression/loss of interest in activities.  Continue meal supplements.  Follow-up after EGD.

## 2019-08-24 ENCOUNTER — Other Ambulatory Visit: Payer: Self-pay | Admitting: Family Medicine

## 2019-08-24 NOTE — Telephone Encounter (Signed)
Pre-op and COVID test 11/10/19. Letter mailed with procedure instructions.

## 2019-09-01 ENCOUNTER — Encounter: Payer: Self-pay | Admitting: Family Medicine

## 2019-09-01 ENCOUNTER — Other Ambulatory Visit: Payer: Self-pay

## 2019-09-01 ENCOUNTER — Ambulatory Visit: Payer: PPO | Admitting: Family Medicine

## 2019-09-01 ENCOUNTER — Ambulatory Visit (INDEPENDENT_AMBULATORY_CARE_PROVIDER_SITE_OTHER): Payer: PPO | Admitting: Family Medicine

## 2019-09-01 ENCOUNTER — Other Ambulatory Visit: Payer: Self-pay | Admitting: Family Medicine

## 2019-09-01 DIAGNOSIS — J449 Chronic obstructive pulmonary disease, unspecified: Secondary | ICD-10-CM | POA: Diagnosis not present

## 2019-09-01 DIAGNOSIS — I10 Essential (primary) hypertension: Secondary | ICD-10-CM | POA: Diagnosis not present

## 2019-09-01 DIAGNOSIS — I700 Atherosclerosis of aorta: Secondary | ICD-10-CM | POA: Diagnosis not present

## 2019-09-01 DIAGNOSIS — F321 Major depressive disorder, single episode, moderate: Secondary | ICD-10-CM

## 2019-09-01 MED ORDER — SPIRIVA HANDIHALER 18 MCG IN CAPS: ORAL_CAPSULE | RESPIRATORY_TRACT | 5 refills | Status: DC

## 2019-09-01 MED ORDER — SERTRALINE HCL 50 MG PO TABS: 50.0000 mg | ORAL_TABLET | Freq: Every day | ORAL | 1 refills | Status: DC

## 2019-09-01 NOTE — Progress Notes (Signed)
   Subjective:    Patient ID: George Meyer, male    DOB: [birth year], 79 y.o.   MRN: [id number]  HPI Pt here for follow up. Pt states he is doing well no issues. Pt states that he went to [place] on 08/19/19 his BP was 185/91. Pt states he knows what caused the increase. Pt began to eat not so healthy. Pt is now correcting his diet. Also, Dr.Rourke does not want to do his endoscopy until May 13 and pt is not happy with that.  Hypertension, unspecified type  Aortic atherosclerosis ([place])  COPD mixed type ([place]) - Plan: Ambulatory referral to Pulmonology  Depression, major, single episode, moderate ([place])    Virtual Visit via Telephone Note  I connected with George Meyer on 09/01/19 at  8:30 AM EST by telephone and verified that I am speaking with the correct person using two identifiers.  Location: Patient: home Provider: office   I discussed the limitations, risks, security and privacy concerns of performing an evaluation and management service by telephone and the availability of in person appointments. I also discussed with the patient that there may be a patient responsible charge related to this service. The patient expressed understanding and agreed to proceed.   History of Present Illness:    Observations/Objective:   Assessment and Plan:   Follow Up Instructions:    I discussed the assessment and treatment plan with the patient. The patient was provided an opportunity to ask questions and all were answered. The patient agreed with the plan and demonstrated an understanding of the instructions.   The patient was advised to call back or seek an in-person evaluation if the symptoms worsen or if the condition fails to improve as anticipated.  I provided 30 minutes of non-face-to-face time during this encounter.  This includes reviewing the chart plus also discussing with the patient and also documentation       Review of Systems Had a good discussion with  the patient regarding his depression he finds himself feeling isolated and alone since the pandemic it is causing him to feel depressed lack of appetite lack of enjoyment denies being suicidal. He also relates getting short of breath with activity.  But denies any type of chest tightness pressure pain denies any swelling fevers chills sweats he does relate weight loss but he believes it is related to the depression and poor eating he did see gastroenterology they plan on doing endoscopy later this spring    Objective:   Physical Exam  Today's visit was via telephone Physical exam was not possible for this visit       Assessment & Plan:  1. Hypertension, unspecified type Blood pressure was up at the GI office but otherwise has been okay so therefore continue current meds he will do a in person visit in several weeks we will check blood pressure with our cuff as well  2. Aortic atherosclerosis ([place]) Keep cholesterol under control watch diet stay active  3. COPD mixed type ([place]) Continue current medications but add Spiriva 1 daily  Consult with our pulmonary [place] office  Weight loss patient is going to try to force himself to eat better and eat more  Major depression going forward with sertraline stop trazodone at night do a follow-up visit in approximately 3 to 4 weeks in office

## 2019-09-07 ENCOUNTER — Telehealth: Payer: Self-pay | Admitting: Internal Medicine

## 2019-09-07 ENCOUNTER — Telehealth: Payer: Self-pay | Admitting: *Deleted

## 2019-09-07 NOTE — Telephone Encounter (Signed)
Patient added to list

## 2019-09-07 NOTE — Telephone Encounter (Signed)
Patient wants to be added to a cancel list for his procedure

## 2019-09-07 NOTE — Telephone Encounter (Signed)
Patient on cancellation list for sooner procedure. Called pt. Offered 3/18 at 10:45am. Patient states he will take it. Aware will call back with new pre-op/covid test appt. Endo aware of appt change.   Called pt and is aware new pre-op/covid test appt is scheduled for 3/15 covid test at 2:30pm, pre-op scheduled at 3:15pm. Discussed EGD instructions with pt. Also mailed instructions.

## 2019-09-11 ENCOUNTER — Encounter: Payer: Self-pay | Admitting: Family Medicine

## 2019-09-14 ENCOUNTER — Encounter (HOSPITAL_COMMUNITY)
Admission: RE | Admit: 2019-09-14 | Discharge: 2019-09-14 | Disposition: A | Discharge status: Home / Self Care | Payer: PPO | Source: Ambulatory Visit | Attending: Internal Medicine | Admitting: Internal Medicine

## 2019-09-14 ENCOUNTER — Other Ambulatory Visit (HOSPITAL_COMMUNITY)
Admission: RE | Admit: 2019-09-14 | Discharge: 2019-09-14 | Disposition: A | Discharge status: Home / Self Care | Payer: PPO | Source: Ambulatory Visit | Attending: Internal Medicine | Admitting: Internal Medicine

## 2019-09-14 ENCOUNTER — Other Ambulatory Visit: Payer: Self-pay

## 2019-09-14 DIAGNOSIS — Z01812 Encounter for preprocedural laboratory examination: Secondary | ICD-10-CM | POA: Insufficient documentation

## 2019-09-14 DIAGNOSIS — Z20822 Contact with and (suspected) exposure to covid-19: Secondary | ICD-10-CM | POA: Insufficient documentation

## 2019-09-14 LAB — SARS CORONAVIRUS 2 (TAT 6-24 HRS): SARS Coronavirus 2: NEGATIVE

## 2019-09-15 ENCOUNTER — Ambulatory Visit: Payer: PPO | Admitting: Family Medicine

## 2019-09-17 ENCOUNTER — Ambulatory Visit (HOSPITAL_COMMUNITY)
Admission: RE | Admit: 2019-09-17 | Discharge: 2019-09-17 | Disposition: A | Discharge status: Home / Self Care | Payer: PPO | Attending: Internal Medicine | Admitting: Internal Medicine

## 2019-09-17 ENCOUNTER — Ambulatory Visit (HOSPITAL_COMMUNITY): Payer: PPO | Admitting: Anesthesiology

## 2019-09-17 ENCOUNTER — Encounter (HOSPITAL_COMMUNITY)
Admission: RE | Disposition: A | Discharge status: Home / Self Care | Payer: Self-pay | Source: Home / Self Care | Attending: Internal Medicine

## 2019-09-17 DIAGNOSIS — K228 Other specified diseases of esophagus: Secondary | ICD-10-CM

## 2019-09-17 DIAGNOSIS — F329 Major depressive disorder, single episode, unspecified: Secondary | ICD-10-CM | POA: Insufficient documentation

## 2019-09-17 DIAGNOSIS — Z85828 Personal history of other malignant neoplasm of skin: Secondary | ICD-10-CM | POA: Diagnosis not present

## 2019-09-17 DIAGNOSIS — Z888 Allergy status to other drugs, medicaments and biological substances status: Secondary | ICD-10-CM | POA: Diagnosis not present

## 2019-09-17 DIAGNOSIS — Z7952 Long term (current) use of systemic steroids: Secondary | ICD-10-CM | POA: Diagnosis not present

## 2019-09-17 DIAGNOSIS — I10 Essential (primary) hypertension: Secondary | ICD-10-CM | POA: Insufficient documentation

## 2019-09-17 DIAGNOSIS — A048 Other specified bacterial intestinal infections: Secondary | ICD-10-CM

## 2019-09-17 DIAGNOSIS — K222 Esophageal obstruction: Secondary | ICD-10-CM | POA: Insufficient documentation

## 2019-09-17 DIAGNOSIS — R6881 Early satiety: Secondary | ICD-10-CM | POA: Diagnosis not present

## 2019-09-17 DIAGNOSIS — F17210 Nicotine dependence, cigarettes, uncomplicated: Secondary | ICD-10-CM | POA: Diagnosis not present

## 2019-09-17 DIAGNOSIS — H409 Unspecified glaucoma: Secondary | ICD-10-CM | POA: Insufficient documentation

## 2019-09-17 DIAGNOSIS — R7303 Prediabetes: Secondary | ICD-10-CM | POA: Insufficient documentation

## 2019-09-17 DIAGNOSIS — J449 Chronic obstructive pulmonary disease, unspecified: Secondary | ICD-10-CM | POA: Diagnosis not present

## 2019-09-17 DIAGNOSIS — Z79899 Other long term (current) drug therapy: Secondary | ICD-10-CM | POA: Diagnosis not present

## 2019-09-17 DIAGNOSIS — Z8249 Family history of ischemic heart disease and other diseases of the circulatory system: Secondary | ICD-10-CM | POA: Insufficient documentation

## 2019-09-17 DIAGNOSIS — R634 Abnormal weight loss: Secondary | ICD-10-CM | POA: Insufficient documentation

## 2019-09-17 DIAGNOSIS — K2950 Unspecified chronic gastritis without bleeding: Secondary | ICD-10-CM | POA: Insufficient documentation

## 2019-09-17 DIAGNOSIS — Z801 Family history of malignant neoplasm of trachea, bronchus and lung: Secondary | ICD-10-CM | POA: Diagnosis not present

## 2019-09-17 DIAGNOSIS — E785 Hyperlipidemia, unspecified: Secondary | ICD-10-CM | POA: Diagnosis not present

## 2019-09-17 DIAGNOSIS — K449 Diaphragmatic hernia without obstruction or gangrene: Secondary | ICD-10-CM | POA: Diagnosis not present

## 2019-09-17 DIAGNOSIS — F419 Anxiety disorder, unspecified: Secondary | ICD-10-CM | POA: Insufficient documentation

## 2019-09-17 HISTORY — PX: ESOPHAGOGASTRODUODENOSCOPY (EGD) WITH PROPOFOL: SHX5813

## 2019-09-17 HISTORY — DX: Other specified bacterial intestinal infections: A04.8

## 2019-09-17 HISTORY — PX: BIOPSY: SHX5522

## 2019-09-17 SURGERY — ESOPHAGOGASTRODUODENOSCOPY (EGD) WITH PROPOFOL
Anesthesia: General

## 2019-09-17 MED ORDER — PROPOFOL 500 MG/50ML IV EMUL
INTRAVENOUS | Status: DC | PRN
  Administered 2019-09-17: 75 ug/kg/min via INTRAVENOUS

## 2019-09-17 MED ORDER — PROPOFOL 10 MG/ML IV BOLUS
INTRAVENOUS | Status: DC | PRN
  Administered 2019-09-17: 40 mg via INTRAVENOUS

## 2019-09-17 MED ORDER — LACTATED RINGERS IV SOLN: INTRAVENOUS | Status: DC | PRN

## 2019-09-17 MED ORDER — KETAMINE HCL 50 MG/5ML IJ SOSY
PREFILLED_SYRINGE | INTRAMUSCULAR | Status: AC
  Filled 2019-09-17: qty 5

## 2019-09-17 MED ORDER — KETAMINE HCL 10 MG/ML IJ SOLN
INTRAMUSCULAR | Status: DC | PRN
  Administered 2019-09-17: 10 mg via INTRAVENOUS

## 2019-09-17 MED ORDER — LACTATED RINGERS IV SOLN: Freq: Once | INTRAVENOUS | Status: AC

## 2019-09-17 NOTE — Transfer of Care (Signed)
Immediate Anesthesia Transfer of Care Note  Patient: George Meyer  Procedure(s) Performed: ESOPHAGOGASTRODUODENOSCOPY (EGD) WITH PROPOFOL (N/A ) BIOPSY  Patient Location: PACU  Anesthesia Type:General  Level of Consciousness: awake, alert  and patient cooperative  Airway & Oxygen Therapy: Patient Spontanous Breathing  Post-op Assessment: Report given to RN and Post -op Vital signs reviewed and stable  Post vital signs: Reviewed and stable  Last Vitals:  Vitals Value Taken Time  BP    Temp    Pulse 77 09/17/19 1145  Resp    SpO2 95 % 09/17/19 1145  Vitals shown include unvalidated device data.  Last Pain:  Vitals:   09/17/19 1129  PainSc: 0-No pain         Complications: No apparent anesthesia complications  SEE PACU FLOW SHEET FOR VITALS

## 2019-09-17 NOTE — Discharge Instructions (Signed)
EGD Discharge instructions Please read the instructions outlined below and refer to this sheet in the next few weeks. These discharge instructions provide you with general information on caring for yourself after you leave the hospital. Your doctor may also give you specific instructions. While your treatment has been planned according to the most current medical practices available, unavoidable complications occasionally occur. If you have any problems or questions after discharge, please call your doctor. ACTIVITY  You may resume your regular activity but move at a slower pace for the next 24 hours.   Take frequent rest periods for the next 24 hours.   Walking will help expel (get rid of) the air and reduce the bloated feeling in your abdomen.   No driving for 24 hours (because of the anesthesia (medicine) used during the test).   You may shower.   Do not sign any important legal documents or operate any machinery for 24 hours (because of the anesthesia used during the test).  NUTRITION  Drink plenty of fluids.   You may resume your normal diet.   Begin with a light meal and progress to your normal diet.   Avoid alcoholic beverages for 24 hours or as instructed by your caregiver.  MEDICATIONS  You may resume your normal medications unless your caregiver tells you otherwise.  WHAT YOU CAN EXPECT TODAY  You may experience abdominal discomfort such as a feeling of fullness or "gas" pains.  FOLLOW-UP  Your doctor will discuss the results of your test with you.  SEEK IMMEDIATE MEDICAL ATTENTION IF ANY OF THE FOLLOWING OCCUR:  Excessive nausea (feeling sick to your stomach) and/or vomiting.   Severe abdominal pain and distention (swelling).   Trouble swallowing.   Temperature over 101 F (37.8 C).   Rectal bleeding or vomiting of blood.    Stomach did not appear to be inflamed to me today.  Biopsies were taken.  Further recommendations to follow pending review of  pathology report.  Will be in touch with you next week.

## 2019-09-17 NOTE — Anesthesia Preprocedure Evaluation (Signed)
Anesthesia Evaluation  Patient identified by MRN, date of birth, ID band Patient awake    Reviewed: Allergy & Precautions, NPO status , Patient's Chart, lab work & pertinent test results  Airway Mallampati: II  TM Distance: >3 FB Neck ROM: Full    Dental  (+) Edentulous Upper, Edentulous Lower   Pulmonary shortness of breath and with exertion, COPD,  COPD inhaler, Current Smoker and Patient abstained from smoking.,    Pulmonary exam normal breath sounds clear to auscultation       Cardiovascular Exercise Tolerance: Poor hypertension, Pt. on medications  Rhythm:Regular Rate:Normal     Neuro/Psych PSYCHIATRIC DISORDERS Anxiety Depression    GI/Hepatic negative GI ROS, Neg liver ROS,   Endo/Other  negative endocrine ROS  Renal/GU negative Renal ROS     Musculoskeletal negative musculoskeletal ROS (+)   Abdominal   Peds  Hematology negative hematology ROS (+)   Anesthesia Other Findings   Reproductive/Obstetrics negative OB ROS                            Anesthesia Physical Anesthesia Plan  ASA: III  Anesthesia Plan: General   Post-op Pain Management:    Induction: Intravenous  PONV Risk Score and Plan: TIVA  Airway Management Planned: Nasal Cannula, Natural Airway and Simple Face Mask  Additional Equipment:   Intra-op Plan:   Post-operative Plan:   Informed Consent: I have reviewed the patients History and Physical, chart, labs and discussed the procedure including the risks, benefits and alternatives for the proposed anesthesia with the patient or authorized representative who has indicated his/her understanding and acceptance.     Dental advisory given  Plan Discussed with: CRNA  Anesthesia Plan Comments:        Anesthesia Quick Evaluation

## 2019-09-17 NOTE — H&P (Signed)
@LOGO @   Primary Care Physician:  Kathyrn Drown, MD Primary Gastroenterologist:  Dr. Gala Romney  Pre-Procedure History & Physical: HPI:  George Meyer is a 79 y.o. male here for here for further evaluation of early satiety and weight loss.  Essentially no GI symptoms otherwise.  Negative abdominal CT a few months ago. Symptoms started with pandemic last year.  No dysphagia.  No abdominal pain.  Past Medical History:  Diagnosis Date  . Anxiety   . Cataracts, bilateral    removed by surgery  . COPD (chronic obstructive pulmonary disease) ([place])   . Depression, major, single episode, moderate ([place]) 12/18/2018  . Glaucoma    both eyes  . Glaucoma    bilateral  . Hyperlipidemia   . Hypertension   . Pre-diabetes    diet controlled  . Skin cancer, basal cell    arms, face  . Wears dentures    full    Past Surgical History:  Procedure Laterality Date  . CARDIAC CATHETERIZATION N/A 12/2005   negative  . COLONOSCOPY N/A 12/2009   small hyperplastic polyp repeat in 10 years  . DUPUYTREN CONTRACTURE RELEASE Left 02/02/2019   Procedure: EXCISION LEFT HAND AND SMALL FINGER DUPUYTREN CONTRACTURE RELEASE;  Surgeon: Marybelle Killings, MD;  Location: [facility];  Service: Orthopedics;  Laterality: Left;  . EYE SURGERY Bilateral    remove cataracts  . HAND SURGERY Right 2010   dupuytrens excision right little finger  . TONSILLECTOMY    . WISDOM TOOTH EXTRACTION      Prior to Admission medications   Medication Sig Start Date End Date Taking? Authorizing Provider  albuterol (PROVENTIL) (2.5 MG/3ML) 0.083% nebulizer solution USE 1 VIAL IN NEBULIZER EVERY 4 HOURS AS NEEDED FOR WHEEZING Patient taking differently: Take 2.5 mg by nebulization 2 (two) times a day.  01/07/19  Yes Luking, Elayne Snare, MD  albuterol (VENTOLIN HFA) 108 (90 Base) MCG/ACT inhaler Inhale 2 puffs into the lungs every 4 (four) hours as needed. Patient taking differently: Inhale 2 puffs into the lungs every 4 (four) hours as needed for  wheezing or shortness of breath.  03/18/19  Yes Luking, Elayne Snare, MD  ALPRAZolam (XANAX) 0.5 MG tablet TAKE 1 TABLET BY MOUTH AT BEDTIME AS NEEDED FOR ANXIETY Patient taking differently: Take 0.5 mg by mouth at bedtime as needed (anxiety).  09/01/19  Yes Kathyrn Drown, MD  aspirin EC 81 MG tablet Take 81 mg by mouth daily.   Yes [provider]  budesonide-formoterol (SYMBICORT) 160-4.5 MCG/ACT inhaler INHALE TWO PUFFS BY MOUTH TWICE DAILY ** STOP SPIRIVA ** Patient taking differently: Inhale 2 puffs into the lungs in the morning and at bedtime. INHALE TWO PUFFS BY MOUTH TWICE DAILY ** STOP SPIRIVA ** 03/18/19  Yes Luking, Scott A, MD  dorzolamide-timolol (COSOPT) 22.3-6.8 MG/ML ophthalmic solution Place 1 drop into both eyes 2 (two) times daily. 06/02/19  Yes Kathyrn Drown, MD  lisinopril (ZESTRIL) 5 MG tablet Take 1 tablet by mouth once daily Patient taking differently: Take 5 mg by mouth every evening.  08/25/19  Yes Luking, Elayne Snare, MD  nitroGLYCERIN (NITROSTAT) 0.4 MG SL tablet 1 sl q 5 min prn chest pressure as directed Patient taking differently: Place 0.4 mg under the tongue every 5 (five) minutes as needed for chest pain. 1 sl q 5 min prn chest pressure as directed 03/27/16  Yes Luking, Scott A, MD  pravastatin (PRAVACHOL) 40 MG tablet Take 1 tablet (40 mg total) by mouth daily.  Patient taking differently: Take 40 mg by mouth at bedtime.  01/08/19  Yes Luking, Elayne Snare, MD  Saw Palmetto 450 MG CAPS Take 450 mg by mouth in the morning and at bedtime.   Yes [provider]  sertraline (ZOLOFT) 50 MG tablet Take 1 tablet (50 mg total) by mouth daily. 09/01/19  Yes Kathyrn Drown, MD  predniSONE (DELTASONE) 20 MG tablet Take 20-80 mg by mouth daily as needed (for respiratory issues).    [provider]  tiotropium (SPIRIVA HANDIHALER) 18 MCG inhalation capsule One inhalation of capsule daily Patient not taking: Reported on 09/08/2019 09/01/19   Kathyrn Drown, MD    Allergies  as of 08/19/2019 - Review Complete 08/19/2019  Allergen Reaction Noted  . Amlodipine Itching 12/21/2013  . Chlorzoxazone Itching 03/18/2019    Family History  Problem Relation Age of Onset  . Heart attack Father   . Heart attack Mother   . Lung cancer Brother   . Colon cancer Neg Hx     Social History   Socioeconomic History  . Marital status: Single    Spouse name: Not on file  . Number of children: Not on file  . Years of education: Not on file  . Highest education level: Not on file  Occupational History  . Not on file  Tobacco Use  . Smoking status: Current Every Day Smoker    Packs/day: 0.50    Years: 66.00    Pack years: 33.00    Types: Cigarettes  . Smokeless tobacco: Never Used  . Tobacco comment: started smoking age 59  Substance and Sexual Activity  . Alcohol use: No  . Drug use: No  . Sexual activity: Yes    Partners: Female  Other Topics Concern  . Not on file  Social History Narrative  . Not on file   Social Determinants of Health   Financial Resource Strain:   . Difficulty of Paying Living Expenses:   Food Insecurity:   . Worried About Charity fundraiser in the Last Year:   . Arboriculturist in the Last Year:   Transportation Needs:   . Film/video editor (Medical):   Marland Kitchen Lack of Transportation (Non-Medical):   Physical Activity:   . Days of Exercise per Week:   . Minutes of Exercise per Session:   Stress:   . Feeling of Stress :   Social Connections:   . Frequency of Communication with Friends and Family:   . Frequency of Social Gatherings with Friends and Family:   . Attends Religious Services:   . Active Member of Clubs or Organizations:   . Attends Archivist Meetings:   Marland Kitchen Marital Status:   Intimate Partner Violence:   . Fear of Current or Ex-Partner:   . Emotionally Abused:   Marland Kitchen Physically Abused:   . Sexually Abused:     Review of Systems: See HPI, otherwise negative ROS  Physical Exam: BP 135/86 (BP Location:  Right Arm)   Pulse 66   Temp 97.9 F (36.6 C)   Resp 18   SpO2 96%  General:   Alert,  , pleasant and cooperative in NAD . Neck:  Supple; no masses or thyromegaly. No significant cervical adenopathy. Lungs:  Clear throughout to auscultation.   No wheezes, crackles, or rhonchi. No acute distress. Heart:  Regular rate and rhythm; no murmurs, clicks, rubs,  or gallops. Abdomen: Non-distended, normal bowel sounds.  Soft and nontender without appreciable mass or  hepatosplenomegaly.  Pulses:  Normal pulses noted. Extremities:  Without clubbing or edema.  Impression/Plan: 79 year old gentleman with early satiety and weight loss.  Essentially devoid of any other GI symptoms or an EGD now being done to further evaluate per plan.  Specifically denies dysphagia.  The risks, benefits, limitations, alternatives and imponderables have been reviewed with the patient. Potential for esophageal dilation, biopsy, etc. have also been reviewed.  Questions have been answered. All parties agreeable.     Notice: This dictation was prepared with Dragon dictation along with smaller phrase technology. Any transcriptional errors that result from this process are unintentional and may not be corrected upon review.

## 2019-09-17 NOTE — Op Note (Signed)
[facility] Patient Name: George Meyer Procedure Date: 09/17/2019 11:16 AM MRN: [id number] Date of Birth: August 09, 1940 Attending MD: Norvel Richards , MD CSN: [id number] Age: 79 Admit Type: Outpatient Procedure:                Upper GI endoscopy Indications:              Early satiety, Weight loss Providers:                Norvel Richards, MD, Otis Peak B. Sharon Seller, RN,                            Randa Spike, Technician Referring MD:              Medicines:                Propofol per Anesthesia Complications:            No immediate complications. Estimated Blood Loss:     Estimated blood loss was minimal. Procedure:                Pre-Anesthesia Assessment:                           - Prior to the procedure, a History and Physical                            was performed, and patient medications and                            allergies were reviewed. The patient's tolerance of                            previous anesthesia was also reviewed. The risks                            and benefits of the procedure and the sedation                            options and risks were discussed with the patient.                            All questions were answered, and informed consent                            was obtained. Prior Anticoagulants: The patient has                            taken no previous anticoagulant or antiplatelet                            agents. ASA Grade Assessment: II - A patient with                            mild systemic disease. After reviewing the risks  and benefits, the patient was deemed in                            satisfactory condition to undergo the procedure.                           After obtaining informed consent, the endoscope was                            passed under direct vision. Throughout the                            procedure, the patient's blood pressure, pulse, and   oxygen saturations were monitored continuously. The                            [id number]-[id number]) scope was introduced through the                            mouth, and advanced to the second part of duodenum.                            The upper GI endoscopy was accomplished without                            difficulty. The patient tolerated the procedure                            well. Scope In: 11:35:26 AM Scope Out: 11:39:31 AM Total Procedure Duration: 0 hours 4 minutes 5 seconds  Findings:      Tubular esophagus patent throughout its course. Noncritical Schatzki's       ring present. 2 noncritical appearing webs mid esophagus. Tubular       esophagus remain patent throughout its course. No esophagitis.      A small hiatal hernia was present. Gastric mucosa appeared somewhat       eroded edematous and erythematous diffusely. No ulcer or infiltrating       process observed. Pylorus patent. Mucosa biopsied      The duodenal bulb and second portion of the duodenum were normal. Impression:               -Esophageal webs/Schatzki's ring non- critical not                            manipulated                           - Small hiatal hernia. Inflamed appearing stomach                            of uncertain significance. Status post biopsy                           - Normal duodenal bulb and second portion of the  duodenum. Moderate Sedation:      Moderate (conscious) sedation was personally administered by an       anesthesia professional. The following parameters were monitored: oxygen       saturation, heart rate, blood pressure, respiratory rate, EKG, adequacy       of pulmonary ventilation, and response to care. Recommendation:           - Patient has a contact number available for                            emergencies. The signs and symptoms of potential                            delayed complications were discussed with the                             patient. Return to normal activities tomorrow.                            Written discharge instructions were provided to the                            patient.                           - Resume previous diet.                           - Continue present medications. Follow up on                            pathology.                           - Return to GI office in 1 month. Procedure Code(s):        --- Professional ---                           [phone], Esophagogastroduodenoscopy, flexible,                            transoral; diagnostic, including collection of                            specimen(s) by brushing or washing, when performed                            (separate procedure) Diagnosis Code(s):        --- Professional ---                           K22.8, Other specified diseases of esophagus                           K44.9, Diaphragmatic hernia without obstruction or  gangrene                           R68.81, Early satiety                           R63.4, Abnormal weight loss CPT copyright 2019 American Medical Association. All rights reserved. The codes documented in this report are preliminary and upon coder review may  be revised to meet current compliance requirements. Cristopher Estimable. Everard Interrante, MD Norvel Richards, MD 09/17/2019 11:49:14 AM This report has been signed electronically. Number of Addenda: 0

## 2019-09-17 NOTE — Anesthesia Postprocedure Evaluation (Signed)
Anesthesia Post Note  Patient: George Meyer  Procedure(s) Performed: ESOPHAGOGASTRODUODENOSCOPY (EGD) WITH PROPOFOL (N/A ) BIOPSY  Patient location during evaluation: PACU Anesthesia Type: General Level of consciousness: awake and alert and patient cooperative Pain management: satisfactory to patient Vital Signs Assessment: post-procedure vital signs reviewed and stable Respiratory status: spontaneous breathing Cardiovascular status: stable Postop Assessment: no apparent nausea or vomiting Anesthetic complications: no     Last Vitals:  Vitals:   09/17/19 0945 09/17/19 1145  BP: 135/86 124/70  Pulse: 66 77  Resp: 18 (!) 33  Temp: 36.6 C 36.6 C  SpO2: 96% 95%    Last Pain:  Vitals:   09/17/19 1145  PainSc: 0-No pain                 Olivine Hiers

## 2019-09-17 NOTE — Anesthesia Procedure Notes (Signed)
Date/Time: 09/17/2019 11:27 AM Performed by: Vista Deck, CRNA Pre-anesthesia Checklist: Patient identified, Emergency Drugs available, Suction available, Timeout performed and Patient being monitored Patient Re-evaluated:Patient Re-evaluated prior to induction Oxygen Delivery Method: Nasal Cannula

## 2019-09-18 ENCOUNTER — Encounter: Payer: Self-pay | Admitting: Internal Medicine

## 2019-09-18 ENCOUNTER — Other Ambulatory Visit: Payer: Self-pay

## 2019-09-18 ENCOUNTER — Telehealth: Payer: Self-pay

## 2019-09-18 DIAGNOSIS — A048 Other specified bacterial intestinal infections: Secondary | ICD-10-CM

## 2019-09-18 LAB — SURGICAL PATHOLOGY

## 2019-09-18 MED ORDER — AMOXICILL-CLARITHRO-LANSOPRAZ PO MISC: Freq: Two times a day (BID) | ORAL | 0 refills | Status: DC

## 2019-09-18 NOTE — Telephone Encounter (Signed)
Lmom waiting on a return call.  

## 2019-09-18 NOTE — Telephone Encounter (Signed)
Per RMR- Send letter to patient.  Send copy of letter with path to referring provider and PCP.  Patient needs PrevPak or generic equivalent x 14 days--hold any acid suppression and/or statin therapy patient may be taking during treatment. Then resume after treatment.   Office visit in 2 to 3 months if not already scheduled.   Please let him know.

## 2019-09-21 ENCOUNTER — Telehealth: Payer: Self-pay | Admitting: Internal Medicine

## 2019-09-21 NOTE — Telephone Encounter (Signed)
Spoke with pt. Pt is aware that the letter was mailed. Pt was given results verbally and is aware that RX was faxed to his pharmacy.

## 2019-09-21 NOTE — Telephone Encounter (Signed)
Spoke with pt. Pt was scheduled for a follow up and results were discussed. RX was sent to pts pharmacy.

## 2019-09-21 NOTE — Telephone Encounter (Signed)
Pt was returning call from Friday. [phone]

## 2019-09-28 ENCOUNTER — Other Ambulatory Visit: Payer: Self-pay

## 2019-09-28 ENCOUNTER — Ambulatory Visit (INDEPENDENT_AMBULATORY_CARE_PROVIDER_SITE_OTHER): Payer: PPO | Admitting: Family Medicine

## 2019-09-28 VITALS — BP 128/82 | Temp 98.2°F | Wt 131.0 lb

## 2019-09-28 DIAGNOSIS — R110 Nausea: Secondary | ICD-10-CM

## 2019-09-28 DIAGNOSIS — R634 Abnormal weight loss: Secondary | ICD-10-CM | POA: Diagnosis not present

## 2019-09-28 DIAGNOSIS — F321 Major depressive disorder, single episode, moderate: Secondary | ICD-10-CM | POA: Diagnosis not present

## 2019-09-28 DIAGNOSIS — J449 Chronic obstructive pulmonary disease, unspecified: Secondary | ICD-10-CM | POA: Diagnosis not present

## 2019-09-28 DIAGNOSIS — R54 Age-related physical debility: Secondary | ICD-10-CM

## 2019-09-28 MED ORDER — SERTRALINE HCL 50 MG PO TABS: 50.0000 mg | ORAL_TABLET | Freq: Every day | ORAL | 4 refills | Status: DC

## 2019-09-28 MED ORDER — ONDANSETRON HCL 8 MG PO TABS: 8.0000 mg | ORAL_TABLET | Freq: Three times a day (TID) | ORAL | 2 refills | Status: DC | PRN

## 2019-09-28 NOTE — Progress Notes (Signed)
   Subjective:    Patient ID: George Meyer, male    DOB: 05/25/1941, 79 y.o.   MRN: [id number]  HPI Patient is seen today for depression follow up.  He states since starting medication he has left dizzy and nauseated a few times and continues to have no appetite. Pt states he has not lost total of 20lbs in the last 2 years He states does not have much of an appetite Erythromycin medication causing him to have nausea Appetite is not good States his breathing is doing okay Patient has had extensive amount of lab work CAT scans in the past 12 months  Review of Systems  Constitutional: Positive for unexpected weight change. Negative for diaphoresis and fatigue.  HENT: Negative for congestion and rhinorrhea.   Respiratory: Negative for cough and shortness of breath.   Cardiovascular: Negative for chest pain and leg swelling.  Gastrointestinal: Negative for abdominal pain and diarrhea.  Skin: Negative for color change and rash.  Neurological: Negative for dizziness and headaches.  Psychiatric/Behavioral: Negative for behavioral problems and confusion.       Objective:   Physical Exam Lungs clear respiratory rate normal heart regular no murmurs pulses normal extremities no edema skin warm dry       Assessment & Plan:  1. Nausea The nausea is related to the H. pylori treatment.  May use Zofran as needed  2. Frailty Patient has been losing weight I believe that is related more so into the COPD but certainly H. pylori could be playing a role hopefully his appetite will improve patient had CT scan of the chest and abdomen within the past 12 months and neither 1 showed any signs of tumor or growth  3. COPD mixed type [facility]) Patient feels he stable currently has appointment with pulmonary coming up at April they will help look over his treatment and recommend any additional changes  4. Depression, major, single episode, moderate ([place]) He states depression is doing better with the  sertraline he does not feel that that is causing his nausea he states the nausea started up when he started on the H. pylori to clarithromycin he states his moods are doing better  5. Weight loss Has lost some more weight but I have encouraged him to do a better job with dietary intake  Follow-up here within 2-1/2 months

## 2019-09-28 NOTE — Patient Instructions (Signed)
Helicobacter Pylori Infection  Helicobacter pylori infection is a bacterial infection in the stomach. Long-term (chronic) infection can cause stomach irritation (gastritis), ulcers in the stomach (gastric ulcers), and ulcers in the upper part of the intestine (duodenal ulcers). Having this infection may also increase your risk of stomach cancer and a type of white blood cell cancer (lymphoma) that affects the stomach.  What are the causes?  This infection is caused by the Helicobacter pylori (H. pylori) bacteria. Many healthy people have this bacteria in their stomach lining. The bacteria may also spread from person to person through contact with stool (feces) or saliva. It is not known why some people develop ulcers, gastritis, or cancer from the bacteria.  What increases the risk?  You are more likely to develop this condition if you:   Have family members with the infection.   Live with many other people, such as in a dormitory.   Are of African, Hispanic, or Asian descent.  What are the signs or symptoms?  Most people with this infection do not have any symptoms. If you do have symptoms, they may include:   Heartburn.   Stomach pain.   Nausea.   Vomiting. The vomit may be bloody because of ulcers.   Loss of appetite.   Bad breath.  How is this diagnosed?  This condition may be diagnosed based on:   Your symptoms and medical history.   A physical exam.   Blood tests.   Stool tests.   A breath test.   A procedure that involves placing a tube with a camera on the end of it down your throat to examine your stomach and upper intestine (upper endoscopy).   Removing and testing a tissue sample from the stomach lining (biopsy). A biopsy may be taken during an upper endoscopy.  How is this treated?    This condition is treated by taking a combination of medicines (triple therapy) for several weeks. Triple therapy includes one medicine to reduce the amount of acid in your stomach and two types of  antibiotic medicines. This treatment may reduce your risk of cancer.  You may need to be tested for H. pylori again after treatment. In some cases, the treatment may need to be repeated if your treatment did not get rid of all the bacteria.  Follow these instructions at home:     Take over-the-counter and prescription medicines only as told by your health care provider.   Take your antibiotics as told by your health care provider. Do not stop taking the antibiotics even if you start to feel better.   Return to your normal activities as told by your health care provider. Ask your health care provider what activities are safe for you.   Take steps to prevent future infections:  ? Wash your hands often with soap and water. If soap and water are not available, use hand sanitizer.  ? Do not eat food or drink water that may have had contact with stool or saliva.   Keep all follow-up visits as told by your health care provider. This is important. You may need tests to make sure your treatment worked.  Contact a health care provider if your symptoms:   Do not get better with treatment.   Return after treatment.  Summary   Helicobacter pylori infection is a stomach infection caused by the Helicobacter pylori (H. pylori) bacteria.   This infection can cause stomach irritation (gastritis), ulcers in the stomach (gastric ulcers), and ulcers   you start to feel better. This information is not intended to replace advice given to you by your health care provider. Make sure you discuss any questions you have with your health care provider. Document Revised: 10/09/2018 Document Reviewed: 06/11/2017 Elsevier Patient Education  [employer].

## 2019-10-05 ENCOUNTER — Telehealth: Payer: Self-pay | Admitting: Internal Medicine

## 2019-10-05 NOTE — Telephone Encounter (Signed)
Pt has questions about his antibiotics and he said that the Hpylori has caused him to lose his appetite. Please call [phone]

## 2019-10-05 NOTE — Telephone Encounter (Signed)
Spoke with pt. Pt will finish his H. Pylori antibiotic tomorrow. Pt reports that the antibiotic has taken his appetite away and he feels that his energy level has decreased some. Pt states that he will call our office if he doesn't get his appetite back, after completing antibiotics.

## 2019-10-05 NOTE — Telephone Encounter (Signed)
Noted. Agree with patient letting us know if his appetite doesn't return.

## 2019-10-05 NOTE — Telephone Encounter (Signed)
Noted  

## 2019-10-19 ENCOUNTER — Telehealth: Payer: Self-pay | Admitting: Internal Medicine

## 2019-10-19 NOTE — Telephone Encounter (Signed)
PATIENT CALLED AND SAID THAT HE HAS FINISHED ALL HIS MEDICATION BUT HE STILL DOES NOT HAVE AN APPETITE.   [place]

## 2019-10-19 NOTE — Telephone Encounter (Signed)
Needs office visit with [facility] to discuss further and decide next step in evaluation.

## 2019-10-19 NOTE — Telephone Encounter (Signed)
Noted. Spoke with pt. Apt has been moved up to next week 10/26/19 @ 8:30 AM

## 2019-10-19 NOTE — Telephone Encounter (Signed)
Spoke with pt. Pt was seen for weight loss 08/19/19 and had an EGD in 08/2019. Pt was dx with H. Pylori and completed is antibiotics as directed. Pt is concerned with the weight loss that he's had since February 2021. Pt has lost 25 lbs and his appetite has changed. Pt states that he has lost a total of 45 lbs over 1 year time frame. Pt says he use to eat a lot. He is eating but not as much. Pt reports no vomiting, no nausea and no diarrhea.   Please advise in the absence of [place].

## 2019-10-21 ENCOUNTER — Institutional Professional Consult (permissible substitution): Payer: PPO | Admitting: Internal Medicine

## 2019-10-21 ENCOUNTER — Ambulatory Visit: Payer: PPO | Admitting: Internal Medicine

## 2019-10-21 ENCOUNTER — Other Ambulatory Visit: Payer: Self-pay | Admitting: Internal Medicine

## 2019-10-21 ENCOUNTER — Other Ambulatory Visit: Payer: Self-pay

## 2019-10-21 ENCOUNTER — Encounter: Payer: Self-pay | Admitting: Internal Medicine

## 2019-10-21 DIAGNOSIS — J449 Chronic obstructive pulmonary disease, unspecified: Secondary | ICD-10-CM | POA: Diagnosis not present

## 2019-10-21 DIAGNOSIS — F17210 Nicotine dependence, cigarettes, uncomplicated: Secondary | ICD-10-CM | POA: Diagnosis not present

## 2019-10-21 NOTE — Progress Notes (Signed)
George Meyer, male    DOB: 08-13-1940, 79 y.o.   MRN: [id number]   Brief patient profile:  75 yowm active smoker with onset doe x around 2016 rx symbicort and prn prednisone and referred to pulmonary clinic 10/21/2019 by Dr   Wolfgang Phoenix      History of Present Illness  10/21/2019  Pulmonary/ 1st office eval/Anetta Olvera  Chief Complaint  Patient presents with  . Pulmonary Consult    Referred by Dr Sallee Lange for eval of COPD.  Pt states he was dxed with COPD 5 years ago. He gets SOB walking approx 50 yards. He uses his albuterol inhaler 6 x per day and neb 2 x per day.   Dyspnea:   MMRC2 = can't walk a nl pace on a flat grade s sob but does fine slow and flat  Cough: none / flared 3 x with uri's x 5 years  Sleep: flat bed/ one pillow no resp  SABA use: 6x daily, never noct and neb twice daily   No obvious day to day or daytime variability or assoc excess/ purulent sputum or mucus plugs or hemoptysis or cp or chest tightness, subjective wheeze or overt sinus or hb symptoms.   sleeping without nocturnal  or early am exacerbation  of respiratory  c/o's or need for noct saba. Also denies any obvious fluctuation of symptoms with weather or environmental changes or other aggravating or alleviating factors except as outlined above   No unusual exposure hx or h/o childhood pna/ asthma or knowledge of premature birth.  Current Allergies, Complete Past Medical History, Past Surgical History, Family History, and Social History were reviewed in [employer] record.  ROS  The following are not active complaints unless bolded Hoarseness, sore throat, dysphagia, dental problems, itching, sneezing,  nasal congestion or discharge of excess mucus or purulent secretions, ear ache,   fever, chills, sweats, unintended wt loss or wt gain, classically pleuritic or exertional cp,  orthopnea pnd or arm/hand swelling  or leg swelling, presyncope, palpitations, abdominal pain, anorexia, nausea,  vomiting, diarrhea  or change in bowel habits or change in bladder habits, change in stools or change in urine, dysuria, hematuria,  rash, arthralgias, visual complaints, headache, numbness, weakness or ataxia or problems with walking or coordination,  change in mood or  memory.           Past Medical History:  Diagnosis Date  . Anxiety   . Cataracts, bilateral    removed by surgery  . COPD (chronic obstructive pulmonary disease) ([place])   . Depression, major, single episode, moderate ([place]) 12/18/2018  . Glaucoma    both eyes  . Glaucoma    bilateral  . Hyperlipidemia   . Hypertension   . Pre-diabetes    diet controlled  . Skin cancer, basal cell    arms, face  . Wears dentures    full    Outpatient Medications Prior to Visit  Medication Sig Dispense Refill  . albuterol (PROVENTIL) (2.5 MG/3ML) 0.083% nebulizer solution USE 1 VIAL IN NEBULIZER EVERY 4 HOURS AS NEEDED FOR WHEEZING (Patient taking differently: Take 2.5 mg by nebulization 2 (two) times a day. ) 300 mL 5  . albuterol (VENTOLIN HFA) 108 (90 Base) MCG/ACT inhaler Inhale 2 puffs into the lungs every 4 (four) hours as needed. (Patient taking differently: Inhale 2 puffs into the lungs every 4 (four) hours as needed for wheezing or shortness of breath. ) 54 g 3  . ALPRAZolam (XANAX) 0.5  MG tablet TAKE 1 TABLET BY MOUTH AT BEDTIME AS NEEDED FOR ANXIETY (Patient taking differently: Take 0.5 mg by mouth at bedtime as needed (anxiety). ) 30 tablet 3  . aspirin EC 81 MG tablet Take 81 mg by mouth daily.    . budesonide-formoterol (SYMBICORT) 160-4.5 MCG/ACT inhaler INHALE TWO PUFFS BY MOUTH TWICE DAILY ** STOP SPIRIVA ** (Patient taking differently: Inhale 2 puffs into the lungs in the morning and at bedtime. INHALE TWO PUFFS BY MOUTH TWICE DAILY ** STOP SPIRIVA **) 3 Inhaler 3  . dorzolamide-timolol (COSOPT) 22.3-6.8 MG/ML ophthalmic solution Place 1 drop into both eyes 2 (two) times daily. 10 mL 0  . lisinopril (ZESTRIL) 5 MG tablet  Take 1 tablet by mouth once daily (Patient taking differently: Take 5 mg by mouth every evening. ) 90 tablet 0  . nitroGLYCERIN (NITROSTAT) 0.4 MG SL tablet 1 sl q 5 min prn chest pressure as directed (Patient taking differently: Place 0.4 mg under the tongue every 5 (five) minutes as needed for chest pain. 1 sl q 5 min prn chest pressure as directed) 50 tablet 3  . ondansetron (ZOFRAN) 8 MG tablet Take 1 tablet (8 mg total) by mouth every 8 (eight) hours as needed for nausea. 12 tablet 2  . pravastatin (PRAVACHOL) 40 MG tablet Take 1 tablet (40 mg total) by mouth daily. (Patient taking differently: Take 40 mg by mouth at bedtime. ) 90 tablet 1  . predniSONE (DELTASONE) 20 MG tablet Take 20-80 mg by mouth daily as needed (for respiratory issues).    . Saw Palmetto 450 MG CAPS Take 450 mg by mouth in the morning and at bedtime.    . sertraline (ZOLOFT) 50 MG tablet Take 1 tablet (50 mg total) by mouth daily. 30 tablet 4  . amoxicillin-clarithromycin-lansoprazole (PREVPAC) combo pack Take by mouth 2 (two) times daily for 14 days. Follow package directions. hold any acid suppression and/or statin therapy patient may be taking during treatment 112 each 0         Objective:     BP 134/84 (BP Location: Left Arm, Cuff Size: Normal)   Pulse 68   Temp (!) 97.3 F (36.3 C) (Temporal)   Ht 5\' 8"  (1.727 m)   Wt 127 lb (57.6 kg)   SpO2 93% Comment: on RA  BMI 19.31 kg/m   SpO2: 93 %(on RA)   Very pleasant thin elderly wm nad   HEENT : pt wearing mask not removed for exam due to covid -19 concerns.    NECK :  without JVD/Nodes/TM/ nl carotid upstrokes bilaterally   LUNGS: no acc muscle use,  Mod barrel  contour chest wall with bilateral  insp/exp rhonchi and  without cough on insp or exp maneuvers and mod  Hyperresonant  to  percussion bilaterally     CV:  RRR  no s3 or murmur or increase in P2, and no edema   ABD:  soft and nontender with pos mid insp Hoover's  in the supine position. No  bruits or organomegaly appreciated, bowel sounds nl  MS:     ext warm without deformities, calf tenderness, cyanosis or clubbing No obvious joint restrictions   SKIN: warm and dry without lesions    NEURO:  alert, approp, nl sensorium with  no motor or cerebellar deficits apparent.         I personally reviewed images and agree with radiology impression as follows:  CXR:   07/31/19  Changes of COPD and old granulomatous  disease.   LDCST 12/30/2018 Moderately advanced changes of emphysema. No pleural effusion. There are multiple calcified and noncalcified pulmonary nodules identified in both lungs. These are not significantly changed in size in the interval. The largest noncalcified nodule is in the posterior lateral right upper lobe with an equivalent diameter 5.4 mm. No new nodules.      Assessment   COPD  Group D symptoms / risk Active smoker  - 10/21/2019  After extensive coaching inhaler device,  effectiveness =    50%    Considering he's still inhaling cigarettes and not fully inhaling his meds he's actually doing better than I would have predicted given his degree of emphysema on cxr but still having occ exac req pred and way over using saba so technically  Group D in terms of symptom/risk and laba/lama/ICS  therefore appropriate rx at this point >>>  Says can only afford symbicort as he gets it from [place] so hold off Breztri for now pendning pfts and first see how well can really master HFA.  I spent extra time with pt today reviewing appropriate use of albuterol for prn use on exertion with the following points: 1) saba is for relief of sob that does not improve by walking a slower pace or resting but rather if the pt does not improve after trying this first. 2) If the pt is convinced, as many are, that saba helps recover from activity faster then it's easy to tell if this is the case by re-challenging : ie stop, take the inhaler, then p 5 minutes try the exact same  activity (intensity of workload) that just caused the symptoms and see if they are substantially diminished or not after saba 3) if there is an activity that reproducibly causes the symptoms, try the saba 15 min before the activity on alternate days   If in fact the saba really does help, then fine to continue to use it prn but advised may need to look closer at the maintenance regimen being used to achieve better control of airways disease with exertion (eg add LAMA either as separate device vs bretri depending on formulary / preference by pt       Cigarette smoker Counseled re importance of smoking cessation but did not meet time criteria for separate billing            Each maintenance medication was reviewed in detail including emphasizing most importantly the difference between maintenance and prns and under what circumstances the prns are to be triggered using an action plan format where appropriate.  Total time for H and P, chart review, counseling, teaching device and generating customized AVS unique to this office visit / charting = 45 min           Christinia Gully, MD 10/21/2019

## 2019-10-21 NOTE — Assessment & Plan Note (Signed)
Counseled re importance of smoking cessation but did not meet time criteria for separate billing            Each maintenance medication was reviewed in detail including emphasizing most importantly the difference between maintenance and prns and under what circumstances the prns are to be triggered using an action plan format where appropriate.  Total time for H and P, chart review, counseling, teaching device and generating customized AVS unique to this office visit / charting = 45 min      

## 2019-10-21 NOTE — Assessment & Plan Note (Signed)
Active smoker  - 10/21/2019  After extensive coaching inhaler device,  effectiveness =    50%    Considering he's still inhaling cigarettes and not fully inhaling his meds he's actually doing better than I would have predicted given his degree of emphysema on cxr but still having occ exac req pred and way over using saba so technically  Group D in terms of symptom/risk and laba/lama/ICS  therefore appropriate rx at this point >>>  Says can only afford symbicort as he gets it from [place] so hold off Breztri for now pendning pfts and first see how well can really master HFA.  I spent extra time with pt today reviewing appropriate use of albuterol for prn use on exertion with the following points: 1) saba is for relief of sob that does not improve by walking a slower pace or resting but rather if the pt does not improve after trying this first. 2) If the pt is convinced, as many are, that saba helps recover from activity faster then it's easy to tell if this is the case by re-challenging : ie stop, take the inhaler, then p 5 minutes try the exact same activity (intensity of workload) that just caused the symptoms and see if they are substantially diminished or not after saba 3) if there is an activity that reproducibly causes the symptoms, try the saba 15 min before the activity on alternate days   If in fact the saba really does help, then fine to continue to use it prn but advised may need to look closer at the maintenance regimen being used to achieve better control of airways disease with exertion (eg add LAMA either as separate device vs bretri depending on formulary / preference by pt

## 2019-10-21 NOTE — Patient Instructions (Addendum)
Plan A = Automatic = Always=    Symbicort 160 Take 2 puffs first thing in am and then another 2 puffs about 12 hours later.    Work on inhaler technique:  relax and gently blow all the way out then take a nice smooth deep breath back in, triggering the inhaler at same time you start breathing in.  Hold for up to 5 seconds if you can. Blow out thru nose. Rinse and gargle with water when done      Plan B = Backup (to supplement plan A, not to replace it) Only use your albuterol inhaler as a rescue medication to be used if you can't catch your breath by resting or doing a relaxed purse lip breathing pattern.  - The less you use it, the better it will work when you need it. - Ok to use the inhaler up to 2 puffs  every 4 hours if you must but call for appointment if use goes up over your usual need - Don't leave home without it !!  (think of it like the spare tire for your car)   Plan C = Crisis (instead of Plan B but only if Plan B stops working) - only use your albuterol nebulizer if you first try Plan B and it fails to help > ok to use the nebulizer up to every 4 hours but if start needing it regularly call for immediate appointment   Plan D = Deltasone  (if ABC aren't working)  Take as per [employer]     Plan E = ER - go to ER or call 911 if all else fails    The key is to stop smoking completely before smoking completely stops you!   Please schedule a follow up visit in 3 months but call sooner if needed with PFTs on return

## 2019-10-24 NOTE — Progress Notes (Signed)
Referring Provider: Kathyrn Drown, MD Primary Care Physician:  Kathyrn Drown, MD Primary GI Physician: Dr. Gala Romney  Chief Complaint  Patient presents with  . Weight Loss    has some questions    HPI:   George Meyer is a 79 y.o. male presenting today for follow-up on weight loss and early satiety. He was last seen in our office on 08/19/19 for the same.   At the time of his office visit, he reported loss of appetite around March 2020 which he attributed to boredom secondary to COVID-19 and having to stay at home. Reported loss of interest in activities. Used to be very active in church but this was on hold due to COVID-19. Also reported early satiety for 3.5 months. Felt he had lost about 36 lbs according to scales at home. According to recorded weights in chart, he had lost 7 lbs in the last 7 months. No other significant upper or lower GI symptoms. Prior evaluation with PCP included negative IFOBT in September 2020, CT A/P without acute findings in October 2020, labs in January 2020 with slight chronic elevation of WBC, otherwise CBC, CMP, lipase, PSA, TSH, free T4, C-reactive protein within normal limits.  Last colonoscopy in 2011 with hyperplastic polyp due for repeat. Plans for EGD to evaluate his upper GI tract. Patient preferred to hold off on TCS. Also discussed with patient that he needed to discuss depression with his PCP.   EGD 09/17/19: Esophageal webs/Schatzki's ring non- critical not manipulated. Small hiatal hernia. Inflamed appearing stomach of uncertain significance. Status post biopsy. Normal duodenal bulb and second portion of the duodenum. Pathology revealed chronic active gastritis with H. Pylori. No metaplasia, dysplasia, or malignancy.   He was treated with PrevPac x 14 days for H. Pylori. Rx sent to pharmacy 09/21/19.   Patient called on 10/05/19 reporting loss of appetite with antibiotics and was scheduled to complete antibiotics on 10/06/19. He would call back if  appetite didn't return.   Telephone call on 10/19/19: Patient reported weight loss and change of appetite. Recommended OV for further evaluation/manageent.   Today:  Completed antibiotics for H. pylori. Nausea has resolved. No vomiting. No abdominal pain. Still doesn't have a great appetite. Feels this is starting to improve somewhat. Starting to get "hunger pains." Has not had that in a while.  Has gained 2 lbs according to his scales at home over the last week. Feels he has been eating better. Eating 2 meals a day and a snack. Breakfast and dinner. Drinking a meal replacement 1-2 times a day. Has been doing this for about 4 months.  No GERD symptoms or dysphagia. No antiacid medications. BMs every other day. Feels BMs are complete. No brbpr or black stools.  Weight loss:  09/11/18: 143 lbs 01/28/19: 143 lbs 08/09/19: 136 lbs 10/26/19: 128 lbs  Lost about 15 lbs in the last 9 months. 8 lbs weight loss in 2.5 months.   Prefers to hold off on colonoscopy and continue to monitor weight loss as he has started to fel better. States he was just concerned and wanted to discuss this further today.   Feels depression is doing better.    Past Medical History:  Diagnosis Date  . Anxiety   . Cataracts, bilateral    removed by surgery  . COPD (chronic obstructive pulmonary disease) ([place])   . Depression, major, single episode, moderate ([place]) 12/18/2018  . Glaucoma    both eyes  . Glaucoma  bilateral  . H. pylori infection 09/17/2019   S/p treatment with Pylera.  . Hyperlipidemia   . Hypertension   . Pre-diabetes    diet controlled  . Skin cancer, basal cell    arms, face  . Wears dentures    full    Past Surgical History:  Procedure Laterality Date  . BIOPSY  09/17/2019   Procedure: BIOPSY;  Surgeon: Daneil Dolin, MD;  [facility];  Service: Endoscopy;;  gastric  . CARDIAC CATHETERIZATION N/A 12/2005   negative  . COLONOSCOPY N/A 12/2009   small hyperplastic polyp repeat  in 10 years  . DUPUYTREN CONTRACTURE RELEASE Left 02/02/2019   Procedure: EXCISION LEFT HAND AND SMALL FINGER DUPUYTREN CONTRACTURE RELEASE;  Surgeon: Marybelle Killings, MD;  Location: [place];  Service: Orthopedics;  Laterality: Left;  . ESOPHAGOGASTRODUODENOSCOPY (EGD) WITH PROPOFOL N/A 09/17/2019   Procedure: ESOPHAGOGASTRODUODENOSCOPY (EGD) WITH PROPOFOL;  Surgeon: Daneil Dolin, MD; esophageal labs/Schatzki's ring noncritical, not manipulated.  Small hiatal hernia.  Inflamed stomach with biopsies revealing chronic active gastritis with H. pylori.  H. pylori treated with Prevpac.  Marland Kitchen EYE SURGERY Bilateral    remove cataracts  . HAND SURGERY Right 2010   dupuytrens excision right little finger  . TONSILLECTOMY    . WISDOM TOOTH EXTRACTION      Current Outpatient Medications  Medication Sig Dispense Refill  . albuterol (PROVENTIL) (2.5 MG/3ML) 0.083% nebulizer solution USE 1 VIAL IN NEBULIZER EVERY 4 HOURS AS NEEDED FOR WHEEZING (Patient taking differently: Take 2.5 mg by nebulization 2 (two) times a day. ) 300 mL 5  . albuterol (VENTOLIN HFA) 108 (90 Base) MCG/ACT inhaler Inhale 2 puffs into the lungs every 4 (four) hours as needed. (Patient taking differently: Inhale 2 puffs into the lungs every 4 (four) hours as needed for wheezing or shortness of breath. ) 54 g 3  . ALPRAZolam (XANAX) 0.5 MG tablet TAKE 1 TABLET BY MOUTH AT BEDTIME AS NEEDED FOR ANXIETY (Patient taking differently: Take 0.5 mg by mouth at bedtime as needed (anxiety). ) 30 tablet 3  . aspirin EC 81 MG tablet Take 81 mg by mouth daily.    . budesonide-formoterol (SYMBICORT) 160-4.5 MCG/ACT inhaler INHALE TWO PUFFS BY MOUTH TWICE DAILY ** STOP SPIRIVA ** (Patient taking differently: Inhale 2 puffs into the lungs in the morning and at bedtime. INHALE TWO PUFFS BY MOUTH TWICE DAILY ** STOP SPIRIVA **) 3 Inhaler 3  . dorzolamide-timolol (COSOPT) 22.3-6.8 MG/ML ophthalmic solution Place 1 drop into both eyes 2 (two) times daily. 10 mL 0    . lisinopril (ZESTRIL) 5 MG tablet Take 1 tablet by mouth once daily (Patient taking differently: Take 5 mg by mouth every evening. ) 90 tablet 0  . nitroGLYCERIN (NITROSTAT) 0.4 MG SL tablet 1 sl q 5 min prn chest pressure as directed (Patient taking differently: Place 0.4 mg under the tongue every 5 (five) minutes as needed for chest pain. 1 sl q 5 min prn chest pressure as directed) 50 tablet 3  . pravastatin (PRAVACHOL) 40 MG tablet Take 1 tablet (40 mg total) by mouth daily. (Patient taking differently: Take 40 mg by mouth at bedtime. ) 90 tablet 1  . predniSONE (DELTASONE) 20 MG tablet Take 20-80 mg by mouth daily as needed (for respiratory issues).    . Saw Palmetto 450 MG CAPS Take 450 mg by mouth in the morning and at bedtime.    . sertraline (ZOLOFT) 50 MG tablet Take 1 tablet (50  mg total) by mouth daily. 30 tablet 4  . ondansetron (ZOFRAN) 8 MG tablet Take 1 tablet (8 mg total) by mouth every 8 (eight) hours as needed for nausea. (Patient not taking: Reported on 10/26/2019) 12 tablet 2   No current facility-administered medications for this visit.    Allergies as of 10/26/2019 - Review Complete 10/26/2019  Allergen Reaction Noted  . Amlodipine Itching 12/21/2013  . Chlorzoxazone Itching 03/18/2019    Family History  Problem Relation Age of Onset  . Heart attack Father   . Heart attack Mother   . Lung cancer Brother   . Colon cancer Neg Hx     Social History   Socioeconomic History  . Marital status: Single    Spouse name: Not on file  . Number of children: Not on file  . Years of education: Not on file  . Highest education level: Not on file  Occupational History  . Not on file  Tobacco Use  . Smoking status: Current Every Day Smoker    Packs/day: 1.00    Years: 66.00    Pack years: 66.00    Types: Cigarettes  . Smokeless tobacco: Never Used  . Tobacco comment: started smoking age 59  Substance and Sexual Activity  . Alcohol use: No  . Drug use: No  . Sexual  activity: Yes    Partners: Female  Other Topics Concern  . Not on file  Social History Narrative  . Not on file   Social Determinants of Health   Financial Resource Strain:   . Difficulty of Paying Living Expenses:   Food Insecurity:   . Worried About Charity fundraiser in the Last Year:   . Arboriculturist in the Last Year:   Transportation Needs:   . Film/video editor (Medical):   Marland Kitchen Lack of Transportation (Non-Medical):   Physical Activity:   . Days of Exercise per Week:   . Minutes of Exercise per Session:   Stress:   . Feeling of Stress :   Social Connections:   . Frequency of Communication with Friends and Family:   . Frequency of Social Gatherings with Friends and Family:   . Attends Religious Services:   . Active Member of Clubs or Organizations:   . Attends Archivist Meetings:   Marland Kitchen Marital Status:     Review of Systems: Gen: Denies fever, chills, lightheadedness, dizziness, presyncope, syncope. CV: Denies chest pain or heart palpitations. Resp: Denies shortness of breath at rest.  Admits to shortness of breath with exertion. Chronic intermittent cough secondary to COPD. GI: See HPI. Psych: See HPI.  Heme: See HPI  Physical Exam: BP (!) 146/87   Pulse 80   Temp (!) 96.9 F (36.1 C) (Temporal)   Ht 5\' 8"  (1.727 m)   Wt 128 lb 3.2 oz (58.2 kg)   BMI 19.49 kg/m  General:   Alert and oriented. No distress noted. Pleasant and cooperative.  Head:  Normocephalic and atraumatic. Eyes:  Conjuctiva clear without scleral icterus. Heart:  S1, S2 present without murmurs appreciated. Lungs: Rhonchi throughout lung fields. No wheezes or rales. No distress.  Abdomen:  +BS, soft, non-tender and non-distended. No rebound or guarding. No HSM or masses noted. Msk:  Symmetrical without gross deformities. Normal posture. Extremities:  Without edema. Neurologic:  Alert and  oriented x4 Psych: Normal mood and affect.

## 2019-10-26 ENCOUNTER — Other Ambulatory Visit: Payer: Self-pay

## 2019-10-26 ENCOUNTER — Encounter: Payer: Self-pay | Admitting: Gastroenterology

## 2019-10-26 ENCOUNTER — Ambulatory Visit: Payer: PPO | Admitting: Gastroenterology

## 2019-10-26 VITALS — BP 146/87 | HR 80 | Temp 96.9°F | Ht 68.0 in | Wt 128.2 lb

## 2019-10-26 DIAGNOSIS — Z8619 Personal history of other infectious and parasitic diseases: Secondary | ICD-10-CM | POA: Diagnosis not present

## 2019-10-26 DIAGNOSIS — R634 Abnormal weight loss: Secondary | ICD-10-CM

## 2019-10-26 NOTE — Patient Instructions (Signed)
Please complete H. pylori breath test on 11/05/2019 or thereafter.  This is to confirm H. pylori eradication.  Continue to avoid all antiacid medications.  Continue working on advancing your diet and trying to eat at least 3 meals daily with snacks between. Try adding more starches and carbs into your diet.   Continue eating plenty of meats and vegetables as you are currently doing. Continue drinking meal replacements twice daily.  We will follow up with you in 2 months to monitor your weight loss.  I am hopeful that this will continue to improve as you have finally started to have improvement in your appetite since completing antibiotics.  Call with questions or concerns prior to your next appointment.  Aliene Altes, PA-C [facility]

## 2019-10-26 NOTE — Assessment & Plan Note (Addendum)
H. pylori diagnosed 09/17/2019 at the time of EGD which revealed inflamed appearing stomach with biopsy.  Pathology with chronic active gastritis with H. pylori.  He was prescribed Prevpac x14 days and completed this on 10/06/2019.  We will need to evaluate for H. pylori eradication next week which will be 4 weeks after antibiotic completion.  H. pylori breath test on 11/05/2019 or thereafter. Follow-up in 2 months for weight loss as discussed below.

## 2019-10-26 NOTE — Assessment & Plan Note (Addendum)
79 year old male with history of COPD, HLD, HTN, prediabetes, anxiety/depression, and weight loss of about 15 pounds in the last 9 months, 8 pounds in the last 2.5 months with associated decrease in appetite/early satiety.  Prior evaluation has included negative IFOBT in September 2020, CT A/P without acute findings in October 2020, labs in January 2020 with slight chronic elevation of WBC, otherwise CBC, CMP, lipase, PSA, TSH, free T4, C-reactive protein within normal limits.  EGD 09/17/2019 with Esophageal webs/Schatzki's ring non- critical not manipulated. Small hiatal hernia. Inflamed appearing stomach of uncertain significance. Status post biopsy. Normal duodenal bulb and second portion of the duodenum. Pathology revealed chronic active gastritis with H. Pylori. No metaplasia, dysplasia, or malignancy. He was treated with PrevPak x 14 days with completion of 10/06/19.  While on antibiotics, he had nausea but this has resolved.  Over the last week, he has also felt his appetite has started to improve and reports he is now having "hunger pains" that he has not had in a while.  Per his scales at home, he has gained 2 pounds in the last week.  Last colonoscopy in 2011 with 1 hyperplastic polyp.  Technically due for surveillance at this time.  Suspect weight loss and change in appetite was secondary to H pylori.  We will need to confirm eradication with H. pylori breath test in 1 week.  Discussed pursuing colonoscopy in light of weight loss considering his last colonoscopy was 10 years ago.  Patient prefers to hold off on colonoscopy for now and evaluate for H. pylori eradication and monitor his weight over the next couple of months.  Complete H. pylori breath test on 11/05/2019 or thereafter. Continue trying to increase oral intake with goal of 3 meals a day and snacks between. Recommend he continue with meats and vegetables and try adding in more starches. Continue drinking meal replacements twice  daily. Follow-up in 2 months.  Recommended he call with questions or concerns prior.

## 2019-10-29 DIAGNOSIS — X32XXXD Exposure to sunlight, subsequent encounter: Secondary | ICD-10-CM | POA: Diagnosis not present

## 2019-10-29 DIAGNOSIS — D225 Melanocytic nevi of trunk: Secondary | ICD-10-CM | POA: Diagnosis not present

## 2019-10-29 DIAGNOSIS — Z85820 Personal history of malignant melanoma of skin: Secondary | ICD-10-CM | POA: Diagnosis not present

## 2019-10-29 DIAGNOSIS — Z1283 Encounter for screening for malignant neoplasm of skin: Secondary | ICD-10-CM | POA: Diagnosis not present

## 2019-10-29 DIAGNOSIS — C44629 Squamous cell carcinoma of skin of left upper limb, including shoulder: Secondary | ICD-10-CM | POA: Diagnosis not present

## 2019-10-29 DIAGNOSIS — L570 Actinic keratosis: Secondary | ICD-10-CM | POA: Diagnosis not present

## 2019-10-29 DIAGNOSIS — C44319 Basal cell carcinoma of skin of other parts of face: Secondary | ICD-10-CM | POA: Diagnosis not present

## 2019-11-02 ENCOUNTER — Other Ambulatory Visit: Payer: Self-pay

## 2019-11-02 ENCOUNTER — Telehealth: Payer: Self-pay | Admitting: Internal Medicine

## 2019-11-02 DIAGNOSIS — A048 Other specified bacterial intestinal infections: Secondary | ICD-10-CM

## 2019-11-02 NOTE — Telephone Encounter (Signed)
Please call patient about his breath test. [phone]

## 2019-11-02 NOTE — Telephone Encounter (Signed)
Added another order for [facility]. Pt is aware.

## 2019-11-03 ENCOUNTER — Other Ambulatory Visit: Payer: Self-pay | Admitting: Family Medicine

## 2019-11-03 DIAGNOSIS — A048 Other specified bacterial intestinal infections: Secondary | ICD-10-CM | POA: Diagnosis not present

## 2019-11-04 ENCOUNTER — Encounter: Payer: Self-pay | Admitting: Gastroenterology

## 2019-11-04 LAB — H. PYLORI BREATH TEST: H. pylori Breath Test: NOT DETECTED

## 2019-11-04 NOTE — Progress Notes (Signed)
H. Pylori test is negative. H. Pylori has been eradicated.

## 2019-11-05 ENCOUNTER — Telehealth: Payer: Self-pay | Admitting: Family Medicine

## 2019-11-05 NOTE — Telephone Encounter (Signed)
Pt is calling to let Dr. Nicki Reaper know his upper endoscopy came back negative. He still doesn't have much of an appetite though. The depression medication is not helping much either. He would like to know what Dr. Nicki Reaper thinks the next steps should be. Pt has lost 43 lbs over the last year.

## 2019-11-05 NOTE — Telephone Encounter (Signed)
I would recommend the patient's appointment be moved up to Monday or Tuesday

## 2019-11-05 NOTE — Telephone Encounter (Signed)
lvm to change appt to Tuesday @ 9:30. 9:30 has been put on hold for pt.

## 2019-11-05 NOTE — Telephone Encounter (Signed)
Patient returned call and is scheduled for visit at 9:30 Tuesday the 11th

## 2019-11-10 ENCOUNTER — Ambulatory Visit (INDEPENDENT_AMBULATORY_CARE_PROVIDER_SITE_OTHER): Payer: PPO | Admitting: Family Medicine

## 2019-11-10 ENCOUNTER — Other Ambulatory Visit (HOSPITAL_COMMUNITY): Payer: PPO

## 2019-11-10 ENCOUNTER — Encounter: Payer: Self-pay | Admitting: Gastroenterology

## 2019-11-10 ENCOUNTER — Other Ambulatory Visit: Payer: Self-pay

## 2019-11-10 VITALS — BP 132/80 | Temp 97.8°F | Wt 124.2 lb

## 2019-11-10 DIAGNOSIS — R634 Abnormal weight loss: Secondary | ICD-10-CM | POA: Diagnosis not present

## 2019-11-10 DIAGNOSIS — R54 Age-related physical debility: Secondary | ICD-10-CM

## 2019-11-10 DIAGNOSIS — F321 Major depressive disorder, single episode, moderate: Secondary | ICD-10-CM

## 2019-11-10 DIAGNOSIS — J449 Chronic obstructive pulmonary disease, unspecified: Secondary | ICD-10-CM

## 2019-11-10 MED ORDER — MIRTAZAPINE 15 MG PO TABS: 15.0000 mg | ORAL_TABLET | Freq: Every day | ORAL | 4 refills | Status: DC

## 2019-11-10 NOTE — Progress Notes (Addendum)
   Subjective:    Patient ID: George Meyer, male    DOB: Feb 15, 1941, 79 y.o.   MRN: [id number]  HPI  Patient comes in today to discuss little appetite despite negative endoscopy. Patient concerned about his weight loss. Patient states he denies any major depression but he just states he has no interest in doing anything.  Does not find much pleasure in anything.  Not suicidal.  Does not feel sertraline is helping him.  Sleeps okay at nighttime.  Appetite is poor.  Energy level fair.  PMH benign had recent EGD biopsies H. pylori test follow-up was negative Review of Systems Please see above    Objective:   Physical Exam Lungs are clear respiratory rate normal heart regular no murmurs extremities no edema skin warm dry  Fall Risk  09/01/2019 12/26/2017 10/19/2014 01/21/2014 12/17/2013  Falls in the past year? 0 No No No No  Number falls in past yr: 0 - - - -  Injury with Fall? 0 - - - -  Follow up Falls evaluation completed - - - -        Assessment & Plan:  Weight loss Frailty Poor appetite Depression Not suicidal Switch to Remeron 15 mg nightly recheck in 4 weeks Encourage dietary measures Continue other medicines as it is

## 2019-11-13 DIAGNOSIS — F321 Major depressive disorder, single episode, moderate: Secondary | ICD-10-CM | POA: Diagnosis not present

## 2019-11-13 DIAGNOSIS — J449 Chronic obstructive pulmonary disease, unspecified: Secondary | ICD-10-CM | POA: Diagnosis not present

## 2019-11-13 DIAGNOSIS — R634 Abnormal weight loss: Secondary | ICD-10-CM | POA: Diagnosis not present

## 2019-11-14 LAB — BASIC METABOLIC PANEL
BUN/Creatinine Ratio: 9 — ABNORMAL LOW (ref 10–24)
BUN: 8 mg/dL (ref 8–27)
CO2: 33 mmol/L — ABNORMAL HIGH (ref 20–29)
Calcium: 9.7 mg/dL (ref 8.6–10.2)
Chloride: 99 mmol/L (ref 96–106)
Creatinine, Ser: 0.86 mg/dL (ref 0.76–1.27)
GFR calc Af Amer: 95 mL/min/{1.73_m2} (ref >59)
GFR calc non Af Amer: 82 mL/min/{1.73_m2} (ref >59)
Glucose: 80 mg/dL (ref 65–99)
Potassium: 5.2 mmol/L (ref 3.5–5.2)
Sodium: 145 mmol/L — ABNORMAL HIGH (ref 134–144)

## 2019-11-14 LAB — CORTISOL: Cortisol: 13.3 ug/dL

## 2019-11-14 LAB — ACTH: ACTH: 17.3 pg/mL (ref 7.2–63.3)

## 2019-11-17 ENCOUNTER — Encounter: Payer: Self-pay | Admitting: Family Medicine

## 2019-11-20 ENCOUNTER — Telehealth: Payer: Self-pay | Admitting: Family Medicine

## 2019-11-20 NOTE — Telephone Encounter (Signed)
Helped appetite for one day. Pt states he is down to 122 lbs. Pt is having no appetite at all. Pt states he ate handful of cheetos, pepsi and peanut butter crackers. Pt is drinking fluids. States "his got up and go has got up and went". Please advise. Thank you   [facility]

## 2019-11-20 NOTE — Telephone Encounter (Signed)
Pt states the antidepressants are not helping his appetite and is having weight loss. Pt has 4 week follow up 6/8

## 2019-11-23 ENCOUNTER — Other Ambulatory Visit: Payer: Self-pay | Admitting: *Deleted

## 2019-11-23 DIAGNOSIS — R634 Abnormal weight loss: Secondary | ICD-10-CM

## 2019-11-23 NOTE — Telephone Encounter (Signed)
Referral for endo ordered in epic. Appointment has been moved to next week. Patient states he doesn't feel like it has helped his depression at all. Sleeping hasn't changed. He feels like it has helped his appetite very little.

## 2019-11-23 NOTE — Telephone Encounter (Signed)
Nurses Please move up the patient's appointment to next week-the week of June 1 Also please go ahead with referral to endocrinology due to weight loss to rule out adrenal insufficiency  Please asked the patient how is he tolerating the medicine?  Has it helped his depression?  Is he sleeping okay or too much?  How is his appetite with this medicine?

## 2019-11-25 NOTE — Telephone Encounter (Signed)
We will discuss further at office visit next week

## 2019-12-01 ENCOUNTER — Ambulatory Visit: Payer: PPO | Admitting: Family Medicine

## 2019-12-02 ENCOUNTER — Ambulatory Visit: Payer: PPO | Admitting: Gastroenterology

## 2019-12-08 ENCOUNTER — Ambulatory Visit: Payer: PPO | Admitting: Family Medicine

## 2019-12-09 ENCOUNTER — Encounter: Payer: Self-pay | Admitting: Family Medicine

## 2019-12-10 ENCOUNTER — Other Ambulatory Visit (HOSPITAL_COMMUNITY): Payer: PPO

## 2019-12-10 ENCOUNTER — Other Ambulatory Visit: Payer: Self-pay

## 2019-12-10 ENCOUNTER — Telehealth: Payer: Self-pay | Admitting: Family Medicine

## 2019-12-10 ENCOUNTER — Telehealth (INDEPENDENT_AMBULATORY_CARE_PROVIDER_SITE_OTHER): Payer: PPO | Admitting: Family Medicine

## 2019-12-10 DIAGNOSIS — F321 Major depressive disorder, single episode, moderate: Secondary | ICD-10-CM | POA: Diagnosis not present

## 2019-12-10 NOTE — Progress Notes (Signed)
° °  Subjective:    Patient ID: George Meyer, male    DOB: 03/13/1941, 79 y.o.   MRN: [id number]  HPI Pt was seen in office on 11/10/19 for weight loss. Pt states since then he has gained 3 pounds. Pt went to [place] with family. Pt states there was 9 people in total and 3 of those came down with COVID. (pt is not having symptoms of COVID). Pt is trying to eat better. States that it is easier for him to go out to eat.  Still not sleeping great moods are doing better energy level doing better Virtual Visit via Telephone Note  I connected with [name] on 12/10/19 at 11:00 AM EDT by telephone and verified that I am speaking with the correct person using two identifiers.  Location: Patient: home Provider: office   I discussed the limitations, risks, security and privacy concerns of performing an evaluation and management service by telephone and the availability of in person appointments. I also discussed with the patient that there may be a patient responsible charge related to this service. The patient expressed understanding and agreed to proceed.   History of Present Illness:    Observations/Objective:   Assessment and Plan:   Follow Up Instructions:    I discussed the assessment and treatment plan with the patient. The patient was provided an opportunity to ask questions and all were answered. The patient agreed with the plan and demonstrated an understanding of the instructions.   The patient was advised to call back or seek an in-person evaluation if the symptoms worsen or if the condition fails to improve as anticipated.  I provided 20 minutes of non-face-to-face time during this encounter.  Including documentation       Review of Systems  Constitutional: Negative for diaphoresis and fatigue.  HENT: Negative for congestion and rhinorrhea.   Respiratory: Negative for cough and shortness of breath.   Cardiovascular: Negative for chest pain and leg swelling.   Gastrointestinal: Negative for abdominal pain and diarrhea.  Skin: Negative for color change and rash.  Neurological: Negative for dizziness and headaches.  Psychiatric/Behavioral: Negative for behavioral problems and confusion.       Objective:   Physical Exam   Today's visit was via telephone Physical exam was not possible for this visit      Assessment & Plan:  Depression improving continue medication Weight gain improving I do not feel he needs to see endocrinology currently because his weight gain is improving I doubt adrenal insufficiency Patient will follow up in approximately 6 weeks in office Patient will do Covid testing and will let us know the results and will stay isolated because of his exposure even though he has had the vaccine

## 2019-12-10 NOTE — Telephone Encounter (Signed)
George Meyer, George Meyer are scheduled for a virtual visit with your provider today.    Just as we do with appointments in the office, we must obtain your consent to participate.  Your consent will be active for this visit and any virtual visit you may have with one of our providers in the next 365 days.    If you have a MyChart account, I can also send a copy of this consent to you electronically.  All virtual visits are billed to your insurance company just like a traditional visit in the office.  As this is a virtual visit, video technology does not allow for your provider to perform a traditional examination.  This may limit your provider's ability to fully assess your condition.  If your provider identifies any concerns that need to be evaluated in person or the need to arrange testing such as labs, EKG, etc, we will make arrangements to do so.    Although advances in technology are sophisticated, we cannot ensure that it will always work on either your end or our end.  If the connection with a video visit is poor, we may have to switch to a telephone visit.  With either a video or telephone visit, we are not always able to ensure that we have a secure connection.   I need to obtain your verbal consent now.   Are you willing to proceed with your visit today?   George Meyer has provided verbal consent on 12/10/2019 for a virtual visit (video or telephone).   Vicente Males, LPN 09/30/7407  92:78 AM

## 2019-12-10 NOTE — Progress Notes (Signed)
B[facility] informed to cancel endocrinologist referral

## 2019-12-17 ENCOUNTER — Ambulatory Visit (HOSPITAL_COMMUNITY): Payer: PPO | Admitting: Nurse Practitioner

## 2019-12-21 ENCOUNTER — Ambulatory Visit: Payer: PPO | Admitting: Gastroenterology

## 2019-12-21 ENCOUNTER — Other Ambulatory Visit: Payer: Self-pay | Admitting: *Deleted

## 2019-12-21 DIAGNOSIS — R911 Solitary pulmonary nodule: Secondary | ICD-10-CM

## 2019-12-22 ENCOUNTER — Other Ambulatory Visit: Payer: Self-pay | Admitting: *Deleted

## 2019-12-22 ENCOUNTER — Telehealth: Payer: Self-pay | Admitting: Family Medicine

## 2019-12-22 ENCOUNTER — Encounter (HOSPITAL_COMMUNITY): Payer: Self-pay | Admitting: *Deleted

## 2019-12-22 DIAGNOSIS — E349 Endocrine disorder, unspecified: Secondary | ICD-10-CM

## 2019-12-22 DIAGNOSIS — R634 Abnormal weight loss: Secondary | ICD-10-CM

## 2019-12-22 NOTE — Telephone Encounter (Signed)
Pt called, states that his weight is staying about the same & he would like to move forward with the endocrinology referral  Please resend to Dr. Dorris Fetch - explained to patient that we would send referral to Dr. Dorris Fetch & his office will contact pt to schedule, pt verbalized understanding

## 2019-12-22 NOTE — Telephone Encounter (Signed)
Referral to referral endocrinology due to weight loss rule out endocrinologic issue

## 2019-12-22 NOTE — Progress Notes (Signed)
We received referral for low dose CT for lung cancer screening.  Patient is not within criteria for the screening.  He is 79 years old and current recommendations are 66-77.  I have notified the referring physician.

## 2019-12-24 DIAGNOSIS — D485 Neoplasm of uncertain behavior of skin: Secondary | ICD-10-CM | POA: Diagnosis not present

## 2019-12-24 DIAGNOSIS — Z85828 Personal history of other malignant neoplasm of skin: Secondary | ICD-10-CM | POA: Diagnosis not present

## 2019-12-24 DIAGNOSIS — Z1283 Encounter for screening for malignant neoplasm of skin: Secondary | ICD-10-CM | POA: Diagnosis not present

## 2019-12-24 DIAGNOSIS — C44629 Squamous cell carcinoma of skin of left upper limb, including shoulder: Secondary | ICD-10-CM | POA: Diagnosis not present

## 2019-12-24 DIAGNOSIS — Z08 Encounter for follow-up examination after completed treatment for malignant neoplasm: Secondary | ICD-10-CM | POA: Diagnosis not present

## 2019-12-27 NOTE — Progress Notes (Signed)
Referring Provider: Kathyrn Drown, MD Primary Care Physician:  Kathyrn Drown, MD Primary GI Physician: Dr. Gala Romney  Chief Complaint  Patient presents with  . Weight Loss    not much appetite    HPI:   George Meyer is a 79 y.o. male presenting today for follow-up of weight loss and early satiety.   History regarding weight loss/early satiety: Symptoms started around March 2020 when having to stay at home due to COVID-19.  Associated loss of interest in activities.  Used to be very active in church but this was on hold. Labs in January 2021 with slight chronic elevation of WBC, otherwise CBC, CMP, lipase, PSA, TSH, free T4, C-reactive protein within normal limits. IFOBT negativein September 2020. CT A/P without acute findings in October 2020.  EGD 09/17/2019 with esophageal web/noncritical Schatzki's ring not manipulated, small hiatal hernia, inflamed appearing stomach positive for H. pylori, normal examined duodenum.  He was treated with Prevpac x14 days.  H. pylori breath test negative on 11/03/2019. Last colonoscopy in 2011 with hyperplastic polyp with recommendations to repeat in 10 years.  This has not been completed as patient preferred to hold off on this.  Last seen in our office on 10/26/2019.  He completed antibiotics for H. pylori on 10/06/2019.  Nausea resolved.  No vomiting or abdominal pain.  Appetite was starting to improve but still was not great.  No other significant GI symptoms.  He had lost about 15 pounds total in the last 9 months, 8 pounds in the last 2.5 months.  Again preferred to hold off on colonoscopy and continue to monitor weight loss if he began to feel better.  Also felt depression was doing somewhat better.    Patient saw Dr. Wolfgang Phoenix on 11/10/2019.  Patient was started on Remeron 15 mg nightly.  Patient has been referred to endocrinology to rule out endocrinology issue attributing to weight loss.  He has appointment on 02/01/2020 with Dr. Dorris Fetch. (Dr. Wolfgang Phoenix  overall felt patient likley didn't need referral after OV on 6/10 but patient call and requested to move forward with this.)   Today:  States he feels great. No pain. Not a lot of energy. Appetite is not great. States he can go all day without getting hungry. Typically eats breakfast. Will make himself eat throughout the day. Will eat a sandwich at lunch and a meat and vegetable at dinner. Drinking 1 boost daily.   Taking Remeron at bedtime. Hasn't noticed a difference in depression or appetite since starting Remeron. Doesn't feel depressed. Lack of interest is improving.   No N/V, GERD, dysphagia. Admits to early satiety which has been present since March 2020. Having BM most every day. No constipation or diarrhea. No blood in the stool or black stool.    States he is ready to figure out what the problem is. Discussed possible GES and colonoscopy and he desires to move forward with this.   Weights: 01/28/2019: 143 lbs 08/19/2019: 136 lbs 10/26/2019: 128 lbs 11/10/2019: 124 lbs 12/28/2019: 126 lbs    Past Medical History:  Diagnosis Date  . Anxiety   . Cataracts, bilateral    removed by surgery  . COPD (chronic obstructive pulmonary disease) ([place])   . Depression, major, single episode, moderate ([place]) 12/18/2018  . Glaucoma    both eyes  . Glaucoma    bilateral  . H. pylori infection 09/17/2019   S/p treatment with Pylera. Documented eradiation via breath test on 11/03/19.   Marland Kitchen  Hyperlipidemia   . Hypertension   . Pre-diabetes    diet controlled  . Skin cancer, basal cell    arms, face  . Wears dentures    full    Past Surgical History:  Procedure Laterality Date  . BIOPSY  09/17/2019   Procedure: BIOPSY;  Surgeon: Daneil Dolin, MD;  [facility];  Service: Endoscopy;;  gastric  . CARDIAC CATHETERIZATION N/A 12/2005   negative  . COLONOSCOPY N/A 12/2009   small hyperplastic polyp repeat in 10 years  . DUPUYTREN CONTRACTURE RELEASE Left 02/02/2019   Procedure:  EXCISION LEFT HAND AND SMALL FINGER DUPUYTREN CONTRACTURE RELEASE;  Surgeon: Marybelle Killings, MD;  Location: [facility];  Service: Orthopedics;  Laterality: Left;  . ESOPHAGOGASTRODUODENOSCOPY (EGD) WITH PROPOFOL N/A 09/17/2019   Procedure: ESOPHAGOGASTRODUODENOSCOPY (EGD) WITH PROPOFOL;  Surgeon: Daneil Dolin, MD; esophageal labs/Schatzki's ring noncritical, not manipulated.  Small hiatal hernia.  Inflamed stomach with biopsies revealing chronic active gastritis with H. pylori.  H. pylori treated with Prevpac.  Marland Kitchen EYE SURGERY Bilateral    remove cataracts  . HAND SURGERY Right 2010   dupuytrens excision right little finger  . TONSILLECTOMY    . WISDOM TOOTH EXTRACTION      Current Outpatient Medications  Medication Sig Dispense Refill  . albuterol (PROVENTIL) (2.5 MG/3ML) 0.083% nebulizer solution USE 1 VIAL IN NEBULIZER EVERY 4 HOURS AS NEEDED FOR WHEEZING (Patient taking differently: Take 2.5 mg by nebulization 2 (two) times a day. ) 300 mL 5  . albuterol (VENTOLIN HFA) 108 (90 Base) MCG/ACT inhaler Inhale 2 puffs into the lungs every 4 (four) hours as needed. (Patient taking differently: Inhale 2 puffs into the lungs every 4 (four) hours as needed for wheezing or shortness of breath. ) 54 g 3  . ALPRAZolam (XANAX) 0.5 MG tablet TAKE 1 TABLET BY MOUTH AT BEDTIME AS NEEDED FOR ANXIETY (Patient taking differently: Take 0.5 mg by mouth at bedtime as needed (anxiety). ) 30 tablet 3  . aspirin EC 81 MG tablet Take 81 mg by mouth daily.    . budesonide-formoterol (SYMBICORT) 160-4.5 MCG/ACT inhaler INHALE TWO PUFFS BY MOUTH TWICE DAILY ** STOP SPIRIVA ** (Patient taking differently: Inhale 2 puffs into the lungs in the morning and at bedtime. INHALE TWO PUFFS BY MOUTH TWICE DAILY ** STOP SPIRIVA **) 3 Inhaler 3  . dorzolamide-timolol (COSOPT) 22.3-6.8 MG/ML ophthalmic solution Place 1 drop into both eyes 2 (two) times daily. 10 mL 0  . lisinopril (ZESTRIL) 5 MG tablet Take 1 tablet by mouth once daily 90  tablet 0  . mirtazapine (REMERON) 15 MG tablet Take 1 tablet (15 mg total) by mouth at bedtime. 30 tablet 4  . nitroGLYCERIN (NITROSTAT) 0.4 MG SL tablet 1 sl q 5 min prn chest pressure as directed (Patient taking differently: Place 0.4 mg under the tongue every 5 (five) minutes as needed for chest pain. 1 sl q 5 min prn chest pressure as directed) 50 tablet 3  . pravastatin (PRAVACHOL) 40 MG tablet Take 1 tablet (40 mg total) by mouth at bedtime. 90 tablet 0  . predniSONE (DELTASONE) 20 MG tablet Take 20-80 mg by mouth daily as needed (for respiratory issues).    . Saw Palmetto 450 MG CAPS Take 450 mg by mouth in the morning and at bedtime.     No current facility-administered medications for this visit.    Allergies as of 12/28/2019 - Review Complete 12/28/2019  Allergen Reaction Noted  . Amlodipine Itching  12/21/2013  . Chlorzoxazone Itching 03/18/2019    Family History  Problem Relation Age of Onset  . Heart attack Father   . Heart attack Mother   . Lung cancer Brother   . Colon cancer Neg Hx     Social History   Socioeconomic History  . Marital status: Single    Spouse name: Not on file  . Number of children: Not on file  . Years of education: Not on file  . Highest education level: Not on file  Occupational History  . Not on file  Tobacco Use  . Smoking status: Current Every Day Smoker    Packs/day: 1.00    Years: 66.00    Pack years: 66.00    Types: Cigarettes  . Smokeless tobacco: Never Used  . Tobacco comment: started smoking age 38  Vaping Use  . Vaping Use: Never used  Substance and Sexual Activity  . Alcohol use: No  . Drug use: No  . Sexual activity: Yes    Partners: Female  Other Topics Concern  . Not on file  Social History Narrative  . Not on file   Social Determinants of Health   Financial Resource Strain:   . Difficulty of Paying Living Expenses:   Food Insecurity:   . Worried About Charity fundraiser in the Last Year:   . Arboriculturist  in the Last Year:   Transportation Needs:   . Film/video editor (Medical):   Marland Kitchen Lack of Transportation (Non-Medical):   Physical Activity:   . Days of Exercise per Week:   . Minutes of Exercise per Session:   Stress:   . Feeling of Stress :   Social Connections:   . Frequency of Communication with Friends and Family:   . Frequency of Social Gatherings with Friends and Family:   . Attends Religious Services:   . Active Member of Clubs or Organizations:   . Attends Archivist Meetings:   Marland Kitchen Marital Status:     Review of Systems: Gen: Denies fever, chills, cold or flulike symptoms, lightheadedness, dizziness, presyncope, syncope. CV: Denies chest pain or palpitations. Resp: Chronic shortness of breath with exertion and chronic cough in the setting of COPD.   GI: See HPI Derm: Denies rash Psych: See HPI Heme: See HPI  Physical Exam: BP 126/78   Pulse 64   Temp 97.6 F (36.4 C) (Oral)   Ht 5\' 8"  (1.727 m)   Wt 126 lb 3.2 oz (57.2 kg)   BMI 19.19 kg/m  General:   Alert and oriented. No distress noted. Pleasant and cooperative.  Head:  Normocephalic and atraumatic. Eyes:  Conjuctiva clear without scleral icterus. Heart:  S1, S2 present without murmurs appreciated. Lungs: Mild rhonchi noted throughout. No wheezes or rales. No distress.  Abdomen:  +BS, soft, non-tender and non-distended. No rebound or guarding. No HSM or masses noted. Msk:  Symmetrical without gross deformities. Normal posture. Extremities:  Without edema. Neurologic:  Alert and  oriented x4 Psych: Normal mood and affect.

## 2019-12-28 ENCOUNTER — Encounter: Payer: Self-pay | Admitting: Gastroenterology

## 2019-12-28 ENCOUNTER — Other Ambulatory Visit: Payer: Self-pay | Admitting: Family Medicine

## 2019-12-28 ENCOUNTER — Other Ambulatory Visit: Payer: Self-pay

## 2019-12-28 ENCOUNTER — Encounter: Payer: Self-pay | Admitting: *Deleted

## 2019-12-28 ENCOUNTER — Other Ambulatory Visit: Payer: Self-pay | Admitting: *Deleted

## 2019-12-28 ENCOUNTER — Ambulatory Visit: Payer: PPO | Admitting: Gastroenterology

## 2019-12-28 VITALS — BP 126/78 | HR 64 | Temp 97.6°F | Ht 68.0 in | Wt 126.2 lb

## 2019-12-28 DIAGNOSIS — R634 Abnormal weight loss: Secondary | ICD-10-CM | POA: Diagnosis not present

## 2019-12-28 DIAGNOSIS — Z86010 Personal history of colonic polyps: Secondary | ICD-10-CM | POA: Diagnosis not present

## 2019-12-28 DIAGNOSIS — R6881 Early satiety: Secondary | ICD-10-CM | POA: Diagnosis not present

## 2019-12-28 NOTE — Assessment & Plan Note (Signed)
Addressed under loss of weight 

## 2019-12-28 NOTE — Assessment & Plan Note (Addendum)
79 year old male with history of COPD, HLD, HTN, anxiety/depression, and weight loss/lack of appetite/early satiety starting back in March 2020.  Total of 19 pound weight loss between July 2020 in May 2021, 2 pound weight gain in the last 6 weeks.  He has had significant work-up over the last year including labs  in January 2021 with slight chronic elevation of WBC, otherwise CBC, CMP, lipase, PSA, TSH, free T4, C-reactive protein within normal limits. IFOBT negativein September 2020. CT A/P without acute findings in October 2020.  EGD 09/17/2019 with esophageal web/noncritical Schatzki's ring not manipulated, small hiatal hernia, inflamed appearing stomach positive for H. pylori, normal examined duodenum.  He was treated with Prevpac x14 days.  H. pylori breath test negative on 11/03/2019.  No significant improvement in lack of appetite since treatment of H. Pylori.  Patient had reported depression/lack of interest in activities that also began in March 2020.  PCP started him on Remeron 15 mg nightly but this has not improved his appetite.  Overall, feels depression is improved and lack of interest is improving.  Last colonoscopy in 2011 with hyperplastic polyp with recommendations to repeat in 10 years.   Etiology of loss of appetite/weight loss/early satiety is not clear.  Had suspected depression/lack of interest secondary to isolation related to COVID-19 was influencing symptoms.  Possible delayed gastric emptying.  Cannot rule out colonic malignancy in the setting of weight loss.  Patient is also scheduled to see endocrinology rule out possible endocrinology issue contributing to weight loss (per PCP referral).   Complete gastric emptying study. Proceed with TCS with propofol with Dr. Gala Romney in the near future. The risks, benefits, and alternatives have been discussed in detail with patient. They have stated understanding and desire to proceed.  Continue eating 3 meals a day. Recommended increasing boost  to twice daily. Follow-up after colonoscopy.

## 2019-12-28 NOTE — Patient Instructions (Signed)
We will arrange for you to have a gastric emptying study to help evaluate early satiety.   We will get you scheduled for colonoscopy in the near future with Dr. Gala Romney.  Continue eating 3 meals a day.  I recommend you increase boost to twice daily.  We will plan to see you back after your colonoscopy.  Do not hesitate to call if you have questions or concerns prior.  Aliene Altes, PA-C [facility]

## 2019-12-28 NOTE — Assessment & Plan Note (Addendum)
79 year old male with history of COPD, HLD, HTN, anxiety/depression, and hyperplastic colon polyp with last colonoscopy in 2011.  It was recommended for patient to repeat colonoscopy in 10 years.  He is currently without any significant lower GI symptoms.  Notably, he had new onset of loss of appetite, early satiety, and weight loss starting back in March 2020 with total of 19 pound weight loss between July 2020-May 2021.  He had significant work-up as discussed above under " loss of weight."  No etiology has been identified.  I have suspected that depression related to isolation from COVID-19 pandemic has been contributing.  However, cannot rule out underlying colonic malignancy attributing to weight loss.  Patient desires to proceed with colonoscopy for further evaluation of weight loss as well as for surveillance purposes.  Proceed with TCS with propofol with Dr. Gala Romney in the near future. The risks, benefits, and alternatives have been discussed in detail with patient. They have stated understanding and desire to proceed.  Follow-up after colonoscopy.

## 2019-12-29 ENCOUNTER — Ambulatory Visit: Payer: PPO | Admitting: Family Medicine

## 2019-12-29 NOTE — Progress Notes (Signed)
Cc'ed to pcp °

## 2020-01-01 ENCOUNTER — Other Ambulatory Visit (HOSPITAL_COMMUNITY): Payer: Self-pay | Admitting: *Deleted

## 2020-01-01 DIAGNOSIS — D72829 Elevated white blood cell count, unspecified: Secondary | ICD-10-CM

## 2020-01-05 ENCOUNTER — Inpatient Hospital Stay (HOSPITAL_COMMUNITY): Payer: PPO | Attending: Hematology

## 2020-01-05 ENCOUNTER — Other Ambulatory Visit: Payer: Self-pay

## 2020-01-05 DIAGNOSIS — D751 Secondary polycythemia: Secondary | ICD-10-CM | POA: Diagnosis not present

## 2020-01-05 DIAGNOSIS — Z85828 Personal history of other malignant neoplasm of skin: Secondary | ICD-10-CM | POA: Insufficient documentation

## 2020-01-05 DIAGNOSIS — R5383 Other fatigue: Secondary | ICD-10-CM | POA: Insufficient documentation

## 2020-01-05 DIAGNOSIS — J449 Chronic obstructive pulmonary disease, unspecified: Secondary | ICD-10-CM | POA: Insufficient documentation

## 2020-01-05 DIAGNOSIS — D72829 Elevated white blood cell count, unspecified: Secondary | ICD-10-CM | POA: Diagnosis not present

## 2020-01-05 DIAGNOSIS — I10 Essential (primary) hypertension: Secondary | ICD-10-CM | POA: Insufficient documentation

## 2020-01-05 DIAGNOSIS — F418 Other specified anxiety disorders: Secondary | ICD-10-CM | POA: Diagnosis not present

## 2020-01-05 DIAGNOSIS — E785 Hyperlipidemia, unspecified: Secondary | ICD-10-CM | POA: Diagnosis not present

## 2020-01-05 DIAGNOSIS — F17210 Nicotine dependence, cigarettes, uncomplicated: Secondary | ICD-10-CM | POA: Diagnosis not present

## 2020-01-05 DIAGNOSIS — Z79899 Other long term (current) drug therapy: Secondary | ICD-10-CM | POA: Insufficient documentation

## 2020-01-05 LAB — COMPREHENSIVE METABOLIC PANEL
ALT: 12 U/L (ref 0–44)
AST: 15 U/L (ref 15–41)
Albumin: 4.2 g/dL (ref 3.5–5.0)
Alkaline Phosphatase: 54 U/L (ref 38–126)
Anion gap: 5 (ref 5–15)
BUN: 9 mg/dL (ref 8–23)
CO2: 34 mmol/L — ABNORMAL HIGH (ref 22–32)
Calcium: 8.8 mg/dL — ABNORMAL LOW (ref 8.9–10.3)
Chloride: 93 mmol/L — ABNORMAL LOW (ref 98–111)
Creatinine, Ser: 0.77 mg/dL (ref 0.61–1.24)
GFR calc Af Amer: >60 mL/min (ref >60)
GFR calc non Af Amer: >60 mL/min (ref >60)
Glucose, Bld: 97 mg/dL (ref 70–99)
Potassium: 4 mmol/L (ref 3.5–5.1)
Sodium: 132 mmol/L — ABNORMAL LOW (ref 135–145)
Total Bilirubin: 0.7 mg/dL (ref 0.3–1.2)
Total Protein: 6.5 g/dL (ref 6.5–8.1)

## 2020-01-05 LAB — CBC WITH DIFFERENTIAL/PLATELET
Abs Immature Granulocytes: 0.05 10*3/uL (ref 0.00–0.07)
Basophils Absolute: 0.1 10*3/uL (ref 0.0–0.1)
Basophils Relative: 1 %
Eosinophils Absolute: 1.3 10*3/uL — ABNORMAL HIGH (ref 0.0–0.5)
Eosinophils Relative: 13 %
HCT: 46.7 % (ref 39.0–52.0)
Hemoglobin: 15.6 g/dL (ref 13.0–17.0)
Immature Granulocytes: 1 %
Lymphocytes Relative: 28 %
Lymphs Abs: 2.9 10*3/uL (ref 0.7–4.0)
MCH: 31.8 pg (ref 26.0–34.0)
MCHC: 33.4 g/dL (ref 30.0–36.0)
MCV: 95.1 fL (ref 80.0–100.0)
Monocytes Absolute: 0.8 10*3/uL (ref 0.1–1.0)
Monocytes Relative: 8 %
Neutro Abs: 5.4 10*3/uL (ref 1.7–7.7)
Neutrophils Relative %: 49 %
Platelets: 393 10*3/uL (ref 150–400)
RBC: 4.91 MIL/uL (ref 4.22–5.81)
RDW: 14.3 % (ref 11.5–15.5)
WBC: 10.7 10*3/uL — ABNORMAL HIGH (ref 4.0–10.5)
nRBC: 0 % (ref 0.0–0.2)

## 2020-01-05 LAB — LACTATE DEHYDROGENASE: LDH: 102 U/L (ref 98–192)

## 2020-01-06 ENCOUNTER — Encounter (HOSPITAL_COMMUNITY): Payer: PPO

## 2020-01-12 ENCOUNTER — Encounter (HOSPITAL_COMMUNITY)
Admission: RE | Admit: 2020-01-12 | Discharge: 2020-01-12 | Disposition: A | Discharge status: Home / Self Care | Payer: PPO | Source: Ambulatory Visit | Attending: Gastroenterology | Admitting: Gastroenterology

## 2020-01-12 ENCOUNTER — Ambulatory Visit (HOSPITAL_COMMUNITY): Payer: PPO | Admitting: Nurse Practitioner

## 2020-01-12 ENCOUNTER — Encounter (HOSPITAL_COMMUNITY): Payer: Self-pay

## 2020-01-12 ENCOUNTER — Other Ambulatory Visit: Payer: Self-pay

## 2020-01-12 DIAGNOSIS — R634 Abnormal weight loss: Secondary | ICD-10-CM

## 2020-01-12 DIAGNOSIS — R6881 Early satiety: Secondary | ICD-10-CM | POA: Diagnosis not present

## 2020-01-12 MED ORDER — TECHNETIUM TC 99M SULFUR COLLOID
2.0000 | Freq: Once | INTRAVENOUS | Status: AC | PRN
  Administered 2020-01-12: 2 via ORAL

## 2020-01-12 NOTE — Progress Notes (Signed)
Gastric emptying study within normal limits meaning his stomach is emptying normally and is not the cause of his decreased appetite/early satiety/weight loss.   If he continues with early satiety/lack of appetite, we may need to consider resuming an acid suppression medication such as Protonix. He has history of H. Pylori gastritis diagnosed in March 2021. H. Pylori was eradicated, but he only took PPI for 2 weeks. He very well may have ongoing gastritis. If patient is ok with it, I would like for him to start Protonix 40 mg daily with plans to treat for 3 months to see if this provides any improvement in symptoms.   Additionally, we have plans to proceed with colonoscopy. Our scheduling team should be reaching out to him one we are able to get him scheduled.

## 2020-01-13 ENCOUNTER — Telehealth: Payer: Self-pay | Admitting: Family Medicine

## 2020-01-13 ENCOUNTER — Other Ambulatory Visit: Payer: Self-pay | Admitting: Gastroenterology

## 2020-01-13 ENCOUNTER — Inpatient Hospital Stay (HOSPITAL_BASED_OUTPATIENT_CLINIC_OR_DEPARTMENT_OTHER): Payer: PPO | Admitting: Nurse Practitioner

## 2020-01-13 ENCOUNTER — Other Ambulatory Visit: Payer: Self-pay | Admitting: Family Medicine

## 2020-01-13 DIAGNOSIS — D72829 Elevated white blood cell count, unspecified: Secondary | ICD-10-CM | POA: Diagnosis not present

## 2020-01-13 DIAGNOSIS — K2970 Gastritis, unspecified, without bleeding: Secondary | ICD-10-CM

## 2020-01-13 DIAGNOSIS — K2960 Other gastritis without bleeding: Secondary | ICD-10-CM

## 2020-01-13 DIAGNOSIS — D72820 Lymphocytosis (symptomatic): Secondary | ICD-10-CM | POA: Diagnosis not present

## 2020-01-13 MED ORDER — PANTOPRAZOLE SODIUM 40 MG PO TBEC: 40.0000 mg | DELAYED_RELEASE_TABLET | Freq: Every day | ORAL | 2 refills | Status: DC

## 2020-01-13 NOTE — Telephone Encounter (Signed)
He may cancel appointment and follow-up with me as scheduled

## 2020-01-13 NOTE — Progress Notes (Signed)
George Meyer, [place] [address]   CLINIC:  Medical Oncology/Hematology  PCP:  George Drown, MD [address] [place] [address] [phone]   REASON FOR VISIT: Follow-up for leukocytosis and thrombocytosis   CURRENT THERAPY: Observation   INTERVAL HISTORY:  George Meyer 79 y.o. male returns for routine follow-up for leukocytosis and thrombocytosis.  Patient reports he is feeling well since his last visit.  He does occasionally get fatigued.  He has a chronic smoker's cough and COPD. Denies any nausea, vomiting, or diarrhea. Denies any new pains. Had not noticed any recent bleeding such as epistaxis, hematuria or hematochezia. Denies recent chest pain on exertion, shortness of breath on minimal exertion, pre-syncopal episodes, or palpitations. Denies any numbness or tingling in hands or feet. Denies any recent fevers, infections, or recent hospitalizations. Patient reports appetite at 25% and energy level at 25%.     REVIEW OF SYSTEMS:  Review of Systems  Constitutional: Positive for fatigue.  Respiratory: Positive for cough and shortness of breath.   Gastrointestinal: Positive for constipation.  All other systems reviewed and are negative.    PAST MEDICAL/SURGICAL HISTORY:  Past Medical History:  Diagnosis Date  . Anxiety   . Cataracts, bilateral    removed by surgery  . COPD (chronic obstructive pulmonary disease) ([place])   . Depression, major, single episode, moderate ([place]) 12/18/2018  . Glaucoma    both eyes  . Glaucoma    bilateral  . H. pylori infection 09/17/2019   S/p treatment with Pylera. Documented eradiation via breath test on 11/03/19.   Marland Kitchen Hyperlipidemia   . Hypertension   . Pre-diabetes    diet controlled  . Skin cancer, basal cell    arms, face  . Wears dentures    full   Past Surgical History:  Procedure Laterality Date  . BIOPSY  09/17/2019   Procedure: BIOPSY;  Surgeon: Daneil Dolin, MD;   [facility];  Service: Endoscopy;;  gastric  . CARDIAC CATHETERIZATION N/A 12/2005   negative  . COLONOSCOPY N/A 12/2009   small hyperplastic polyp repeat in 10 years  . DUPUYTREN CONTRACTURE RELEASE Left 02/02/2019   Procedure: EXCISION LEFT HAND AND SMALL FINGER DUPUYTREN CONTRACTURE RELEASE;  Surgeon: Marybelle Killings, MD;  Location: [facility];  Service: Orthopedics;  Laterality: Left;  . ESOPHAGOGASTRODUODENOSCOPY (EGD) WITH PROPOFOL N/A 09/17/2019   Procedure: ESOPHAGOGASTRODUODENOSCOPY (EGD) WITH PROPOFOL;  Surgeon: Daneil Dolin, MD; esophageal labs/Schatzki's ring noncritical, not manipulated.  Small hiatal hernia.  Inflamed stomach with biopsies revealing chronic active gastritis with H. pylori.  H. pylori treated with Prevpac.  Marland Kitchen EYE SURGERY Bilateral    remove cataracts  . HAND SURGERY Right 2010   dupuytrens excision right little finger  . TONSILLECTOMY    . WISDOM TOOTH EXTRACTION       SOCIAL HISTORY:  Social History   Socioeconomic History  . Marital status: Single    Spouse name: Not on file  . Number of children: Not on file  . Years of education: Not on file  . Highest education level: Not on file  Occupational History  . Not on file  Tobacco Use  . Smoking status: Current Every Day Smoker    Packs/day: 1.00    Years: 66.00    Pack years: 66.00    Types: Cigarettes  . Smokeless tobacco: Never Used  . Tobacco comment: started smoking age 63  Vaping Use  . Vaping Use:  Never used  Substance and Sexual Activity  . Alcohol use: No  . Drug use: No  . Sexual activity: Yes    Partners: Female  Other Topics Concern  . Not on file  Social History Narrative  . Not on file   Social Determinants of Health   Financial Resource Strain:   . Difficulty of Paying Living Expenses:   Food Insecurity:   . Worried About Charity fundraiser in the Last Year:   . Arboriculturist in the Last Year:   Transportation Needs:   . Film/video editor (Medical):   Marland Kitchen  Lack of Transportation (Non-Medical):   Physical Activity:   . Days of Exercise per Week:   . Minutes of Exercise per Session:   Stress:   . Feeling of Stress :   Social Connections:   . Frequency of Communication with Friends and Family:   . Frequency of Social Gatherings with Friends and Family:   . Attends Religious Services:   . Active Member of Clubs or Organizations:   . Attends Archivist Meetings:   Marland Kitchen Marital Status:   Intimate Partner Violence:   . Fear of Current or Ex-Partner:   . Emotionally Abused:   Marland Kitchen Physically Abused:   . Sexually Abused:     FAMILY HISTORY:  Family History  Problem Relation Age of Onset  . Heart attack Father   . Heart attack Mother   . Lung cancer Brother   . Colon cancer Neg Hx     CURRENT MEDICATIONS:  Outpatient Encounter Medications as of 01/13/2020  Medication Sig  . aspirin EC 81 MG tablet Take 81 mg by mouth daily.  . budesonide-formoterol (SYMBICORT) 160-4.5 MCG/ACT inhaler INHALE TWO PUFFS BY MOUTH TWICE DAILY ** STOP SPIRIVA ** (Patient taking differently: Inhale 2 puffs into the lungs in the morning and at bedtime. INHALE TWO PUFFS BY MOUTH TWICE DAILY ** STOP SPIRIVA **)  . dorzolamide-timolol (COSOPT) 22.3-6.8 MG/ML ophthalmic solution Place 1 drop into both eyes 2 (two) times daily.  Marland Kitchen lisinopril (ZESTRIL) 5 MG tablet Take 1 tablet by mouth once daily  . mirtazapine (REMERON) 15 MG tablet Take 1 tablet (15 mg total) by mouth at bedtime.  . pravastatin (PRAVACHOL) 40 MG tablet Take 1 tablet (40 mg total) by mouth at bedtime.  . Saw Palmetto 450 MG CAPS Take 450 mg by mouth in the morning and at bedtime.  . sertraline (ZOLOFT) 50 MG tablet Take 50 mg by mouth daily.  Marland Kitchen albuterol (PROVENTIL) (2.5 MG/3ML) 0.083% nebulizer solution USE 1 VIAL IN NEBULIZER EVERY 4 HOURS AS NEEDED FOR WHEEZING (Patient not taking: Reported on 01/13/2020)  . albuterol (VENTOLIN HFA) 108 (90 Base) MCG/ACT inhaler Inhale 2 puffs into the lungs  every 4 (four) hours as needed. (Patient not taking: Reported on 01/13/2020)  . ALPRAZolam (XANAX) 0.5 MG tablet TAKE 1 TABLET BY MOUTH AT BEDTIME AS NEEDED FOR ANXIETY (Patient not taking: Reported on 01/13/2020)  . nitroGLYCERIN (NITROSTAT) 0.4 MG SL tablet 1 sl q 5 min prn chest pressure as directed (Patient not taking: Reported on 01/13/2020)  . predniSONE (DELTASONE) 20 MG tablet Take 20-80 mg by mouth daily as needed (for respiratory issues). (Patient not taking: Reported on 01/13/2020)   No facility-administered encounter medications on file as of 01/13/2020.    ALLERGIES:  Allergies  Allergen Reactions  . Amlodipine Itching  . Chlorzoxazone Itching     PHYSICAL EXAM:  ECOG Performance status: 1  Vitals:  01/13/20 0912  BP: 134/62  Pulse: 62  Resp: 18  Temp: (!) 97.1 F (36.2 C)  SpO2: 97%   Filed Weights   01/13/20 0912  Weight: 125 lb (56.7 kg)   Physical Exam Constitutional:      Appearance: Normal appearance. He is normal weight.  Cardiovascular:     Rate and Rhythm: Normal rate and regular rhythm.     Heart sounds: Normal heart sounds.  Pulmonary:     Effort: Pulmonary effort is normal.     Breath sounds: Normal breath sounds.  Abdominal:     General: Bowel sounds are normal.     Palpations: Abdomen is soft.  Musculoskeletal:        General: Normal range of motion.  Skin:    General: Skin is warm.  Neurological:     Mental Status: He is alert and oriented to person, place, and time. Mental status is at baseline.  Psychiatric:        Mood and Affect: Mood normal.        Behavior: Behavior normal.        Thought Content: Thought content normal.        Judgment: Judgment normal.      LABORATORY DATA:  I have reviewed the labs as listed.  CBC    Component Value Date/Time   WBC 10.7 (H) 01/05/2020 1347   RBC 4.91 01/05/2020 1347   HGB 15.6 01/05/2020 1347   HGB 16.9 07/31/2019 0838   HCT 46.7 01/05/2020 1347   HCT 50.6 07/31/2019 0838   PLT 393  01/05/2020 1347   PLT 382 07/31/2019 0838   MCV 95.1 01/05/2020 1347   MCV 95 07/31/2019 0838   MCH 31.8 01/05/2020 1347   MCHC 33.4 01/05/2020 1347   RDW 14.3 01/05/2020 1347   RDW 13.3 07/31/2019 0838   LYMPHSABS 2.9 01/05/2020 1347   LYMPHSABS 3.0 07/31/2019 0838   MONOABS 0.8 01/05/2020 1347   EOSABS 1.3 (H) 01/05/2020 1347   EOSABS 1.0 (H) 07/31/2019 0838   BASOSABS 0.1 01/05/2020 1347   BASOSABS 0.1 07/31/2019 0838   CMP Latest Ref Rng & Units 01/05/2020 11/13/2019 07/31/2019  Glucose 70 - 99 mg/dL 97 80 87  BUN 8 - 23 mg/dL 9 8 8   Creatinine 0.61 - 1.24 mg/dL 0.77 0.86 0.87  Sodium 135 - 145 mmol/L 132(L) 145(H) 140  Potassium 3.5 - 5.1 mmol/L 4.0 5.2 4.5  Chloride 98 - 111 mmol/L 93(L) 99 98  CO2 22 - 32 mmol/L 34(H) 33(H) 29  Calcium 8.9 - 10.3 mg/dL 8.8(L) 9.7 9.5  Total Protein 6.5 - 8.1 g/dL 6.5 - 6.4  Total Bilirubin 0.3 - 1.2 mg/dL 0.7 - 0.5  Alkaline Phos 38 - 126 U/L 54 - 66  AST 15 - 41 U/L 15 - 16  ALT 0 - 44 U/L 12 - 10   All questions were answered to patient's stated satisfaction. Encouraged patient to call with any new concerns or questions before his next visit to the cancer center and we can certain see him sooner, if needed.     ASSESSMENT & PLAN:  Leukocytosis 1.  Leukocytosis and thrombocytosis: -He had an extensive evaluation with JAK2 and BCR/ABL which was negative. -Leukocytosis likely secondary to smoking. -Thrombocytosis has resolved. -Labs done on 01/05/2020 showed WBC 10.7 and platelets 393 -He will return to the clinic in 1 year with repeat labs.  2.  Tobacco abuse: -Patient has a history of smoking greater than 30 years and is  over the age of 57. -Low-dose CT lung cancer screening done on 12/30/2018 was lung RADS 2 -His PCP will order his follow-up scan.     Orders placed this encounter:  Orders Placed This Encounter  Procedures  . CBC with Differential/Platelet  . Comprehensive metabolic panel  . Lactate dehydrogenase  . Vitamin  B12  . VITAMIN D 25 Hydroxy (Vit-D Deficiency, Fractures)      Francene Finders, FNP-C [facility] [phone]

## 2020-01-13 NOTE — Assessment & Plan Note (Signed)
1.  Leukocytosis and thrombocytosis: -He had an extensive evaluation with JAK2 and BCR/ABL which was negative. -Leukocytosis likely secondary to smoking. -Thrombocytosis has resolved. -Labs done on 01/05/2020 showed WBC 10.7 and platelets 393 -He will return to the clinic in 1 year with repeat labs.  2.  Tobacco abuse: -Patient has a history of smoking greater than 30 years and is over the age of 55. -Low-dose CT lung cancer screening done on 12/30/2018 was lung RADS 2 -His PCP will order his follow-up scan.

## 2020-01-13 NOTE — Telephone Encounter (Signed)
Please advise. Thank you

## 2020-01-13 NOTE — Patient Instructions (Signed)
[facility] at [facility] Discharge Instructions  Follow up in 1 year with labs    Thank you for choosing [facility] at [facility] to provide your oncology and hematology care.  To afford each patient quality time with our provider, please arrive at least 15 minutes before your scheduled appointment time.   If you have a lab appointment with the Cancer Center please come in thru the Main Entrance and check in at the main information desk.  You need to re-schedule your appointment should you arrive 10 or more minutes late.  We strive to give you quality time with our providers, and arriving late affects you and other patients whose appointments are after yours.  Also, if you no show three or more times for appointments you may be dismissed from the clinic at the providers discretion.     Again, thank you for choosing [facility].  Our hope is that these requests will decrease the amount of time that you wait before being seen by our physicians.       _____________________________________________________________  Should you have questions after your visit to [facility], please contact our office at [phone] between the hours of 8:00 a.m. and 4:30 p.m.  Voicemails left after 4:00 p.m. will not be returned until the following business day.  For prescription refill requests, have your pharmacy contact our office and allow 72 hours.    Due to Covid, you will need to wear a mask upon entering the hospital. If you do not have a mask, a mask will be given to you at the Main Entrance upon arrival. For doctor visits, patients may have 1 support person with them. For treatment visits, patients can not have anyone with them due to social distancing guidelines and our immunocompromised population.      

## 2020-01-13 NOTE — Telephone Encounter (Signed)
Pt would like to know if Dr. Nicki Reaper thinks he should keep appt with Dr. Dorris Fetch. All other doctors and test he has been to and done have come back all normal. He doesn't feel he really needs to go. He feels his weight loss is from sitting at home this past year being depressed.

## 2020-01-13 NOTE — Telephone Encounter (Signed)
Pt contacted and verbalized understanding.  

## 2020-01-18 ENCOUNTER — Telehealth: Payer: Self-pay | Admitting: Internal Medicine

## 2020-01-18 NOTE — Telephone Encounter (Signed)
Pt had seen [facility] recently and called to see when he could get scheduled his colonoscopy. I told him someone would be calling him. [phone]

## 2020-01-18 NOTE — Telephone Encounter (Signed)
Called and informed pt we will call him when [facility] September schedule is available. Note was on encounter form that pt wanted to wait until September or later.

## 2020-01-22 ENCOUNTER — Ambulatory Visit (INDEPENDENT_AMBULATORY_CARE_PROVIDER_SITE_OTHER): Payer: PPO | Admitting: Family Medicine

## 2020-01-22 ENCOUNTER — Encounter: Payer: Self-pay | Admitting: Family Medicine

## 2020-01-22 ENCOUNTER — Other Ambulatory Visit: Payer: Self-pay

## 2020-01-22 VITALS — BP 124/80 | Temp 97.6°F | Wt 127.6 lb

## 2020-01-22 DIAGNOSIS — I10 Essential (primary) hypertension: Secondary | ICD-10-CM

## 2020-01-22 DIAGNOSIS — F324 Major depressive disorder, single episode, in partial remission: Secondary | ICD-10-CM | POA: Diagnosis not present

## 2020-01-22 DIAGNOSIS — F5101 Primary insomnia: Secondary | ICD-10-CM | POA: Diagnosis not present

## 2020-01-22 DIAGNOSIS — J449 Chronic obstructive pulmonary disease, unspecified: Secondary | ICD-10-CM | POA: Diagnosis not present

## 2020-01-22 DIAGNOSIS — R634 Abnormal weight loss: Secondary | ICD-10-CM | POA: Diagnosis not present

## 2020-01-22 MED ORDER — LISINOPRIL 5 MG PO TABS: 5.0000 mg | ORAL_TABLET | Freq: Every day | ORAL | 1 refills | Status: DC

## 2020-01-22 MED ORDER — ALPRAZOLAM 0.5 MG PO TABS: ORAL_TABLET | ORAL | 3 refills | Status: DC

## 2020-01-22 MED ORDER — PRAVASTATIN SODIUM 40 MG PO TABS: 40.0000 mg | ORAL_TABLET | Freq: Every day | ORAL | 1 refills | Status: DC

## 2020-01-22 NOTE — Progress Notes (Signed)
   Subjective:    Patient ID: George Meyer, male    DOB: [birth year], 79 y.o.   MRN: [id number]  HPI Pt here today for follow up on depression. Pt states he is doing well with no issues or concerns. Pt taking Xanax and Zoloft.   Pt had CT for lung cancer screening last year (12/30/2018) and impression states continue annual screening with low dose chest CT without contrast in 12 months.  Review of Systems  Uses albuterol twice per day and prn Symbicort twice per day. Forcing self to eat three meals per day. Denies falls- "steady on my feet' Concerned about losing weight. Drinks Boost daily. Considering starting B12 SL soon       Objective:   Physical Exam Vitals reviewed.  Constitutional:      General: He is not in acute distress. HENT:     Head: Normocephalic and atraumatic.  Eyes:     General:        Right eye: No discharge.        Left eye: No discharge.  Neck:     Trachea: No tracheal deviation.  Cardiovascular:     Rate and Rhythm: Normal rate and regular rhythm.     Heart sounds: Normal heart sounds. No murmur heard.   Pulmonary:     Effort: Pulmonary effort is normal. No respiratory distress.     Breath sounds: Wheezing present.  Lymphadenopathy:     Cervical: No cervical adenopathy.  Skin:    General: Skin is warm and dry.  Neurological:     Mental Status: He is alert.     Coordination: Coordination normal.  Psychiatric:        Behavior: Behavior normal.           Assessment & Plan:  Weight stable COPD still severe Continue current measures Patient has aged out of the annual CT I do not feel the benefit of the colonoscopy outweighs the risk currently Follow-up in several months lab work later in the fall

## 2020-02-01 ENCOUNTER — Ambulatory Visit: Payer: PPO | Admitting: "Endocrinology

## 2020-02-04 ENCOUNTER — Telehealth: Payer: Self-pay | Admitting: *Deleted

## 2020-02-04 NOTE — Telephone Encounter (Signed)
LMOVM for pt Pt needs TCS with propofol with Dr. Gala Romney, [place].

## 2020-02-09 ENCOUNTER — Ambulatory Visit (INDEPENDENT_AMBULATORY_CARE_PROVIDER_SITE_OTHER): Payer: PPO | Admitting: Family Medicine

## 2020-02-09 ENCOUNTER — Other Ambulatory Visit: Payer: Self-pay

## 2020-02-09 ENCOUNTER — Encounter: Payer: Self-pay | Admitting: Family Medicine

## 2020-02-09 VITALS — BP 124/86 | HR 92 | Ht 68.0 in | Wt 127.0 lb

## 2020-02-09 DIAGNOSIS — J449 Chronic obstructive pulmonary disease, unspecified: Secondary | ICD-10-CM

## 2020-02-09 DIAGNOSIS — R062 Wheezing: Secondary | ICD-10-CM

## 2020-02-09 DIAGNOSIS — R1031 Right lower quadrant pain: Secondary | ICD-10-CM | POA: Diagnosis not present

## 2020-02-09 DIAGNOSIS — T148XXA Other injury of unspecified body region, initial encounter: Secondary | ICD-10-CM

## 2020-02-09 NOTE — Telephone Encounter (Signed)
Letter mailed

## 2020-02-09 NOTE — Patient Instructions (Signed)
Ibuprofen tablets and capsules What is this medicine? IBUPROFEN (eye BYOO proe fen) is a non-steroidal anti-inflammatory drug (NSAID). It is used for dental pain, fever, headaches or migraines, osteoarthritis, rheumatoid arthritis, or painful monthly periods. It can also relieve minor aches and pains caused by a cold, flu, or sore throat. This medicine may be used for other purposes; ask your health care provider or pharmacist if you have questions. COMMON BRAND NAME(S): Advil, Advil Junior Strength, Advil Migraine, Genpril, Ibren, IBU, Ibupak, Midol, Midol Cramps and Body Aches, Motrin, Motrin IB, Motrin Junior Strength, Motrin Migraine Pain, Samson-8, Toxicology Saliva Collection What should I tell my health care provider before I take this medicine? They need to know if you have any of these conditions:  cigarette smoker  coronary artery bypass graft (CABG) surgery within the past 2 weeks  drink more than 3 alcohol-containing drinks a day  heart disease  high blood pressure  history of stomach bleeding  kidney disease  liver disease  lung or breathing disease, like asthma  an unusual or allergic reaction to ibuprofen, aspirin, other NSAIDs, other medicines, foods, dyes, or preservatives  pregnant or trying to get pregnant  breast-feeding How should I use this medicine? Take this medicine by mouth with a glass of water. Follow the directions on the prescription label. Take this medicine with food if your stomach gets upset. Try to not lie down for at least 10 minutes after you take the medicine. Take your medicine at regular intervals. Do not take your medicine more often than directed. A special MedGuide will be given to you by the pharmacist with each prescription and refill. Be sure to read this information carefully each time. Talk to your pediatrician regarding the use of this medicine in children. Special care may be needed. Overdosage: If you think you have taken too much  of this medicine contact a poison control center or emergency room at once. NOTE: This medicine is only for you. Do not share this medicine with others. What if I miss a dose? If you miss a dose, take it as soon as you can. If it is almost time for your next dose, take only that dose. Do not take double or extra doses. What may interact with this medicine? Do not take this medicine with any of the following medications:  cidofovir  ketorolac  methotrexate  pemetrexed This medicine may also interact with the following medications:  alcohol  aspirin  diuretics  lithium  other drugs for inflammation like prednisone  warfarin This list may not describe all possible interactions. Give your health care provider a list of all the medicines, herbs, non-prescription drugs, or dietary supplements you use. Also tell them if you smoke, drink alcohol, or use illegal drugs. Some items may interact with your medicine. What should I watch for while using this medicine? Tell your doctor or healthcare provider if your symptoms do not start to get better or if they get worse. This medicine may cause serious skin reactions. They can happen weeks to months after starting the medicine. Contact your healthcare provider right away if you notice fevers or flu-like symptoms with a rash. The rash may be red or purple and then turn into blisters or peeling of the skin. Or, you might notice a red rash with swelling of the face, lips or lymph nodes in your neck or under your arms. This medicine does not prevent heart attack or stroke. In fact, this medicine may increase the chance of a  heart attack or stroke. The chance may increase with longer use of this medicine and in people who have heart disease. If you take aspirin to prevent heart attack or stroke, talk with your doctor or healthcare provider. Do not take other medicines that contain aspirin, ibuprofen, or naproxen with this medicine. Side effects such as  stomach upset, nausea, or ulcers may be more likely to occur. Many medicines available without a prescription should not be taken with this medicine. This medicine can cause ulcers and bleeding in the stomach and intestines at any time during treatment. Ulcers and bleeding can happen without warning symptoms and can cause death. To reduce your risk, do not smoke cigarettes or drink alcohol while you are taking this medicine. You may get drowsy or dizzy. Do not drive, use machinery, or do anything that needs mental alertness until you know how this medicine affects you. Do not stand or sit up quickly, especially if you are an older patient. This reduces the risk of dizzy or fainting spells. This medicine can cause you to bleed more easily. Try to avoid damage to your teeth and gums when you brush or floss your teeth. This medicine may be used to treat migraines. If you take migraine medicines for 10 or more days a month, your migraines may get worse. Keep a diary of headache days and medicine use. Contact your healthcare provider if your migraine attacks occur more frequently. What side effects may I notice from receiving this medicine? Side effects that you should report to your doctor or health care professional as soon as possible:  allergic reactions like skin rash, itching or hives, swelling of the face, lips, or tongue  redness, blistering, peeling or loosening of the skin, including inside the mouth  severe stomach pain  signs and symptoms of bleeding such as bloody or black, tarry stools; red or dark-brown urine; spitting up blood or brown material that looks like coffee grounds; red spots on the skin; unusual bruising or bleeding from the eye, gums, or nose  signs and symptoms of a blood clot such as changes in vision; chest pain; severe, sudden headache; trouble speaking; sudden numbness or weakness of the face, arm, or leg  unexplained weight gain or swelling  unusually weak or  tired  yellowing of eyes or skin Side effects that usually do not require medical attention (report to your doctor or health care professional if they continue or are bothersome):  bruising  diarrhea  dizziness, drowsiness  headache  nausea, vomiting This list may not describe all possible side effects. Call your doctor for medical advice about side effects. You may report side effects to FDA at 1-800-FDA-1088. Where should I keep my medicine? Keep out of the reach of children. Store at room temperature between 15 and 30 degrees C (59 and 86 degrees F). Keep container tightly closed. Throw away any unused medicine after the expiration date. NOTE: This sheet is a summary. It may not cover all possible information. If you have questions about this medicine, talk to your doctor, pharmacist, or health care provider.  2020 Elsevier/Gold Standard (2018-09-03 14:11:00) Muscle Strain A muscle strain is an injury that happens when a muscle is stretched longer than normal. This can happen during a fall, sports, or lifting. This can tear some muscle fibers. Usually, recovery from muscle strain takes 1-2 weeks. Complete healing normally takes 5-6 weeks. This condition is first treated with PRICE therapy. This involves:  Protecting your muscle from being injured again.  Resting your injured muscle.  Icing your injured muscle.  Applying pressure (compression) to your injured muscle. This may be done with a splint or elastic bandage.  Raising (elevating) your injured muscle. Your doctor may also recommend medicine for pain. Follow these instructions at home: If you have a splint:  Wear the splint as told by your doctor. Take it off only as told by your doctor.  Loosen the splint if your fingers or toes tingle, get numb, or turn cold and blue.  Keep the splint clean.  If the splint is not waterproof: ? Do not let it get wet. ? Cover it with a watertight covering when you take a bath or  a shower. Managing pain, stiffness, and swelling   If directed, put ice on your injured area. ? If you have a removable splint, take it off as told by your doctor. ? Put ice in a plastic bag. ? Place a towel between your skin and the bag. ? Leave the ice on for 20 minutes, 2-3 times a day.  Move your fingers or toes often. This helps to avoid stiffness and lessen swelling.  Raise your injured area above the level of your heart while you are sitting or lying down.  Wear an elastic bandage as told by your doctor. Make sure it is not too tight. General instructions  Take over-the-counter and prescription medicines only as told by your doctor.  Limit your activity. Rest your injured muscle as told by your doctor. Your doctor may say that gentle movements are okay.  If physical therapy was prescribed, do exercises as told by your doctor.  Do not put pressure on any part of the splint until it is fully hardened. This may take many hours.  Do not use any products that contain nicotine or tobacco, such as cigarettes and e-cigarettes. These can delay bone healing. If you need help quitting, ask your doctor.  Warm up before you exercise. This helps to prevent more muscle strains.  Ask your doctor when it is safe to drive if you have a splint.  Keep all follow-up visits as told by your doctor. This is important. Contact a doctor if:  You have more pain or swelling in your injured area. Get help right away if:  You have any of these problems in your injured area: ? You have numbness. ? You have tingling. ? You lose a lot of strength. Summary  A muscle strain is an injury that happens when a muscle is stretched longer than normal.  This condition is first treated with PRICE therapy. This includes protecting, resting, icing, adding pressure, and raising your injury.  Limit your activity. Rest your injured muscle as told by your doctor. Your doctor may say that gentle movements are  okay.  Warm up before you exercise. This helps to prevent more muscle strains. This information is not intended to replace advice given to you by your health care provider. Make sure you discuss any questions you have with your health care provider. Document Revised: 08/14/2018 Document Reviewed: 07/25/2016 Elsevier Patient Education  [place].

## 2020-02-09 NOTE — Progress Notes (Signed)
Patient ID: George Meyer, male    DOB: [birth year], 79 y.o.   MRN: [id number]   Chief Complaint  Patient presents with  . Abdominal Pain   Subjective:    HPI  right side pain. Started 2 days ago. Pain was 2/10 on Sunday. Today 4/10. Pain only with bending forward and stops once straight again. Denies fever, chills, nausea, vomiting. Pain not associated with eating.   Medical History George Meyer has a past medical history of Anxiety, Cataracts, bilateral, COPD (chronic obstructive pulmonary disease) ([place]), Depression, major, single episode, moderate ([place]) (12/18/2018), Glaucoma, Glaucoma, H. pylori infection (09/17/2019), Hyperlipidemia, Hypertension, Pre-diabetes, Skin cancer, basal cell, and Wears dentures.   Outpatient Encounter Medications as of 02/09/2020  Medication Sig  . albuterol (PROVENTIL) (2.5 MG/3ML) 0.083% nebulizer solution USE 1 VIAL IN NEBULIZER EVERY 4 HOURS AS NEEDED FOR WHEEZING  . albuterol (VENTOLIN HFA) 108 (90 Base) MCG/ACT inhaler Inhale 2 puffs into the lungs every 4 (four) hours as needed.  . ALPRAZolam (XANAX) 0.5 MG tablet 1 Q HS prn  . aspirin EC 81 MG tablet Take 81 mg by mouth daily.  . budesonide-formoterol (SYMBICORT) 160-4.5 MCG/ACT inhaler INHALE TWO PUFFS BY MOUTH TWICE DAILY ** STOP SPIRIVA ** (Patient taking differently: Inhale 2 puffs into the lungs in the morning and at bedtime. INHALE TWO PUFFS BY MOUTH TWICE DAILY ** STOP SPIRIVA **)  . dorzolamide-timolol (COSOPT) 22.3-6.8 MG/ML ophthalmic solution Place 1 drop into both eyes 2 (two) times daily.  Marland Kitchen lisinopril (ZESTRIL) 5 MG tablet Take 1 tablet (5 mg total) by mouth daily.  . mirtazapine (REMERON) 15 MG tablet Take 1 tablet (15 mg total) by mouth at bedtime.  . nitroGLYCERIN (NITROSTAT) 0.4 MG SL tablet 1 sl q 5 min prn chest pressure as directed  . pantoprazole (PROTONIX) 40 MG tablet Take 1 tablet (40 mg total) by mouth daily before breakfast.  . pravastatin (PRAVACHOL) 40 MG tablet Take 1  tablet (40 mg total) by mouth at bedtime.  . predniSONE (DELTASONE) 20 MG tablet Take 20-80 mg by mouth daily as needed (for respiratory issues).   . Saw Palmetto 450 MG CAPS Take 450 mg by mouth in the morning and at bedtime.   No facility-administered encounter medications on file as of 02/09/2020.     Review of Systems  Constitutional: Negative for chills and fever.  HENT: Negative.   Gastrointestinal: Positive for abdominal pain and constipation. Negative for abdominal distention, blood in stool, diarrhea, nausea and vomiting.       RLQ pain started Sunday. Does not radiate, describes as dull. 2-4/10.  Denies blood in stool.  Endocrine: Negative.   Genitourinary: Negative.   Musculoskeletal: Negative.   Skin: Negative.   Neurological: Negative.   Hematological: Negative.   Psychiatric/Behavioral: Negative.      Vitals BP 124/86   Pulse 92   Ht 5\' 8"  (1.727 m)   Wt 127 lb (57.6 kg)   SpO2 95%   BMI 19.31 kg/m   Objective:   Physical Exam Vitals and nursing note reviewed.  Constitutional:      Appearance: He is well-developed.  Cardiovascular:     Heart sounds: Normal heart sounds.  Pulmonary:     Effort: Pulmonary effort is normal.     Breath sounds: Wheezing and rhonchi present.     Comments: Has COPD. Reports breathing is good today and wheezing is normal for him. Abdominal:     General: Abdomen is flat. Bowel sounds are normal. There is no  distension. There are no signs of injury.     Palpations: Abdomen is soft.     Tenderness: There is no guarding or rebound. Negative signs include Murphy's sign, Rovsing's sign and McBurney's sign.     Comments: Benign exam. Unable to elicit pain with deep palpation x 4 quadrants. Bowel sounds normal.   Skin:    General: Skin is warm and dry.  Neurological:     Mental Status: He is alert and oriented to person, place, and time.  Psychiatric:        Mood and Affect: Mood normal.        Behavior: Behavior normal.       Assessment and Plan   1. Muscle strain  Mr. Test presents after 2 day history of right side pain. Started on Sunday and reports pain is only with bending forward and resolves once he stands up straight. Abdominal exam negative. Will treat conservatively for now.   Apply ice to area 20 minutes at a time several times per day. May also use heat to see if it feels better.  Take Ibuprofen 400 mg 3 times per day with food.  Will follow-up if his symptoms worsen, otherwise, he will keep his October appointment with Dr. Sallee Lange as scheduled.  Understands and agrees with this plan of care and will await to see if his symptoms worsen before ordering imaging. Constipation is not new for him- last BM today and was normal for him.      02/09/2020

## 2020-02-15 ENCOUNTER — Telehealth: Payer: Self-pay | Admitting: Internal Medicine

## 2020-02-15 NOTE — Telephone Encounter (Signed)
PATIENT CALLED STATING HE WANTED TO CANCEL HIS PROCEDURE BECAUSE HIS FAMILY DOCTOR TOLD HIM NOT TO HAVE IT DONE

## 2020-02-15 NOTE — Telephone Encounter (Signed)
Called pt, PCP told him he didn't need TCS and it's not recommended after age 79.  FYI to [employer] PA.

## 2020-02-15 NOTE — Telephone Encounter (Signed)
Called and informed pt.  George Meyer, please scheduled f/u appt. Pt is ok with October or November.

## 2020-02-15 NOTE — Telephone Encounter (Signed)
Noted. I agree that colonoscopy is not typically performed after age 80 unless patients develops concerning symptoms. We had planned for colonoscopy due to weight loss. I have reviewed his chart and it looks like his weight has been stable over the last couple of months. If he prefers to cancel colonoscopy, that is ok. We will need to arrange follow-up as we had planned to follow-up after procedure.   We can plan to follow-up in the later part of October or early November; whatever works better for him.

## 2020-02-17 ENCOUNTER — Encounter: Payer: Self-pay | Admitting: Internal Medicine

## 2020-02-17 NOTE — Telephone Encounter (Signed)
OV made and letter mailed °

## 2020-03-17 ENCOUNTER — Other Ambulatory Visit: Payer: Self-pay

## 2020-03-17 ENCOUNTER — Other Ambulatory Visit (INDEPENDENT_AMBULATORY_CARE_PROVIDER_SITE_OTHER): Payer: PPO | Admitting: *Deleted

## 2020-03-17 DIAGNOSIS — Z23 Encounter for immunization: Secondary | ICD-10-CM

## 2020-04-04 DIAGNOSIS — Z85820 Personal history of malignant melanoma of skin: Secondary | ICD-10-CM | POA: Diagnosis not present

## 2020-04-04 DIAGNOSIS — Z85828 Personal history of other malignant neoplasm of skin: Secondary | ICD-10-CM | POA: Diagnosis not present

## 2020-04-04 DIAGNOSIS — Z08 Encounter for follow-up examination after completed treatment for malignant neoplasm: Secondary | ICD-10-CM | POA: Diagnosis not present

## 2020-04-11 ENCOUNTER — Other Ambulatory Visit: Payer: Self-pay | Admitting: Family Medicine

## 2020-04-25 ENCOUNTER — Ambulatory Visit (INDEPENDENT_AMBULATORY_CARE_PROVIDER_SITE_OTHER): Payer: PPO | Admitting: Family Medicine

## 2020-04-25 ENCOUNTER — Encounter: Payer: Self-pay | Admitting: Family Medicine

## 2020-04-25 ENCOUNTER — Other Ambulatory Visit: Payer: Self-pay

## 2020-04-25 VITALS — BP 122/84 | Temp 97.9°F | Wt 128.0 lb

## 2020-04-25 DIAGNOSIS — I700 Atherosclerosis of aorta: Secondary | ICD-10-CM

## 2020-04-25 DIAGNOSIS — J432 Centrilobular emphysema: Secondary | ICD-10-CM

## 2020-04-25 DIAGNOSIS — I10 Essential (primary) hypertension: Secondary | ICD-10-CM

## 2020-04-25 DIAGNOSIS — F324 Major depressive disorder, single episode, in partial remission: Secondary | ICD-10-CM

## 2020-04-25 MED ORDER — ALPRAZOLAM 0.5 MG PO TABS
ORAL_TABLET | ORAL | 3 refills | Status: DC
Start: 2020-04-25 — End: 2020-09-12

## 2020-04-25 MED ORDER — BUDESONIDE-FORMOTEROL FUMARATE 160-4.5 MCG/ACT IN AERO
INHALATION_SPRAY | RESPIRATORY_TRACT | 6 refills | Status: DC
Start: 2020-04-25 — End: 2020-09-12

## 2020-04-25 MED ORDER — ALBUTEROL SULFATE HFA 108 (90 BASE) MCG/ACT IN AERS
2.0000 | INHALATION_SPRAY | RESPIRATORY_TRACT | 3 refills | Status: DC | PRN
Start: 2020-04-25 — End: 2020-09-12

## 2020-04-25 MED ORDER — LISINOPRIL 5 MG PO TABS
5.0000 mg | ORAL_TABLET | Freq: Every day | ORAL | 1 refills | Status: DC
Start: 2020-04-25 — End: 2020-09-12

## 2020-04-25 MED ORDER — MIRTAZAPINE 15 MG PO TABS
ORAL_TABLET | ORAL | 5 refills | Status: DC
Start: 2020-04-25 — End: 2020-05-09

## 2020-04-25 MED ORDER — PRAVASTATIN SODIUM 40 MG PO TABS
40.0000 mg | ORAL_TABLET | Freq: Every day | ORAL | 1 refills | Status: DC
Start: 2020-04-25 — End: 2020-09-12

## 2020-04-25 NOTE — Progress Notes (Signed)
   Subjective:    Patient ID: George Meyer, male    DOB: 1941-06-26, 79 y.o.   MRN: [id number]  HPI  Patient comes in for follow up on depression, currently taking remeron.  Incredibly nice patient Presents follow-up.  States he still feels a little bit down at times but he feels his biggest problem is loneliness related to the pandemic he relates he is trying to connect as best as possible with family via the phone and with church members as best as possible His COPD is stable uses his inhalers on a regular basis but the humidity causes significant trouble Has history of aortic atherosclerosis but does watch his diet takes his medicine Blood pressure hanging in there he watches salt takes his medicine Loss of weight and frailty this is being worked up with a gastric emptying study CT of the abdomen and pelvis CT of the chest and there are no specific findings with any of these Hypertension.   Review of Systems  Constitutional: Negative for activity change.  HENT: Negative for congestion and rhinorrhea.   Respiratory: Negative for cough and shortness of breath.   Cardiovascular: Negative for chest pain.  Gastrointestinal: Negative for abdominal pain, diarrhea, nausea and vomiting.  Genitourinary: Negative for dysuria and hematuria.  Neurological: Negative for weakness and headaches.  Psychiatric/Behavioral: Negative for behavioral problems and confusion.       Objective:   Physical Exam Vitals reviewed.  Cardiovascular:     Rate and Rhythm: Normal rate and regular rhythm.     Heart sounds: Normal heart sounds. No murmur heard.   Pulmonary:     Effort: Pulmonary effort is normal.     Breath sounds: Normal breath sounds.  Lymphadenopathy:     Cervical: No cervical adenopathy.  Neurological:     Mental Status: He is alert.  Psychiatric:        Behavior: Behavior normal.           Assessment & Plan:  1. Primary hypertension Blood pressure good control taken medicine  minimizing salt staying active  2. Depression, major, single episode, in partial remission ([place]) Moderate depression doing better using Remeron at nighttime denies any major setbacks or problems working hard at trying to be less lonely  Will follow up here in 4 to 5 months  3. Aortic atherosclerosis ([facility]) Aortic atherosclerosis continue current measures continue cholesterol medicine  4. Centrilobular emphysema ([place]) Emphysema patient has stopped smoking we will continue inhalers up-to-date on flu shot pneumonia shot recommended Covid booster  Weight loss is stable currently he will work hard at dietary measures keeping things under control  Follow-up in March

## 2020-04-30 NOTE — Progress Notes (Signed)
Referring Provider: Kathyrn Drown, MD Primary Care Physician:  Kathyrn Drown, MD Primary GI Physician: Dr. Gala Romney  Chief Complaint  Patient presents with  . Follow-up    fu poor appetite, may need refill on Pantoprazole    HPI:   George Meyer is a 79 y.o. male presenting today for follow-up of weight loss and early satiety.  History regarding weight loss/early satiety: Symptoms started around March 2020 when having to stay at home due to COVID-19.  Associated loss of interest in activities.  Used to be very active in church but this was on hold. Labs in January 2021 with slight chronic elevation of WBC, otherwise CBC, CMP, lipase, PSA, TSH, free T4, C-reactive protein within normal limits. IFOBT negativein September 2020. CT A/P without acute findings in October 2020.  EGD 09/17/2019 with esophageal web/noncritical Schatzki's ring not manipulated, small hiatal hernia, inflamed appearing stomach positive for H. pylori, normal examined duodenum.  He was treated with Prevpac x14 days.  H. pylori breath test negative on 11/03/2019. Last colonoscopy in 2011 with hyperplastic polyp with recommendations to repeat in 10 years.  Primary care refer patient to endocrinology for further evaluation.  He had appointment scheduled for 02/01/2020, but canceled this.  He was last seen in our office 12/28/2019 stating he felt great in general but his appetite was still low.  He was trying to make himself eat throughout the day.  Had been started on Remeron at bedtime by PCP and had not noticed a difference in depression or appetite since starting this.  Reported his lack of interest was improving.  Continued with early satiety but no other significant upper GI symptoms.  Bowels moving well without alarm symptoms.  Patient stated he was ready to figure out what the problem was.  Discussed also GES and colonoscopy and patient desired to move forward with this.  Also recommended eating 3 meals a day and  increasing boost to twice daily.  Gastric emptying study 01/12/2020 within normal limits.  Plan to resume Protonix 40 mg daily to see if this would help with his early satiety.  Query whether he may have mild ongoing gastritis as he was only on PPI for 2 weeks during H. pylori treatment.  Patient called 02/15/2020 stating he wanted to cancel his colonoscopy.  Today:  Feels great. Appetite is about the same, not that great, but ok. Just making sure he eats at least 2 meals a day. No nausea, vomiting, or abdominal pain. No dysphagia. No GERD symptoms, never had any GERD symptoms.  Taking Protonix, but has not noticed any difference in his appetite.  Protonix has caused increased gas.  BMs 2-3 days a week with a couple bowel movements on those days. Stools start out as hard, then he has another soft bowel movement. This has been his baseline for years. Will take clearlax as needed for constipation; has not needed this in 6-8 months.  No blood in the stool or black stools.   Drinking 2 meal replacements a day.   No depression.   Didn't go to endocrine appointment as he is "feeling fine".   Past Medical History:  Diagnosis Date  . Anxiety   . Cataracts, bilateral    removed by surgery  . COPD (chronic obstructive pulmonary disease) ([place])   . Depression, major, single episode, moderate ([place]) 12/18/2018  . Glaucoma    both eyes  . Glaucoma    bilateral  . H. pylori infection 09/17/2019  S/p treatment with Prevpac. Documented eradiation via breath test on 11/03/19.   Marland Kitchen Hyperlipidemia   . Hypertension   . Pre-diabetes    diet controlled  . Skin cancer, basal cell    arms, face  . Wears dentures    full    Past Surgical History:  Procedure Laterality Date  . BIOPSY  09/17/2019   Procedure: BIOPSY;  Surgeon: Daneil Dolin, MD;  Location: [facility];  Service: Endoscopy;;  gastric  . CARDIAC CATHETERIZATION N/A 12/2005   negative  . COLONOSCOPY N/A 12/2009   small hyperplastic polyp  repeat in 10 years  . DUPUYTREN CONTRACTURE RELEASE Left 02/02/2019   Procedure: EXCISION LEFT HAND AND SMALL FINGER DUPUYTREN CONTRACTURE RELEASE;  Surgeon: Marybelle Killings, MD;  Location: [facility];  Service: Orthopedics;  Laterality: Left;  . ESOPHAGOGASTRODUODENOSCOPY (EGD) WITH PROPOFOL N/A 09/17/2019   Procedure: ESOPHAGOGASTRODUODENOSCOPY (EGD) WITH PROPOFOL;  Surgeon: Daneil Dolin, MD; esophageal labs/Schatzki's ring noncritical, not manipulated.  Small hiatal hernia.  Inflamed stomach with biopsies revealing chronic active gastritis with H. pylori.  H. pylori treated with Prevpac.  Marland Kitchen EYE SURGERY Bilateral    remove cataracts  . HAND SURGERY Right 2010   dupuytrens excision right little finger  . TONSILLECTOMY    . WISDOM TOOTH EXTRACTION      Current Outpatient Medications  Medication Sig Dispense Refill  . albuterol (PROVENTIL) (2.5 MG/3ML) 0.083% nebulizer solution USE 1 VIAL IN NEBULIZER EVERY 4 HOURS AS NEEDED FOR WHEEZING 300 mL 3  . albuterol (VENTOLIN HFA) 108 (90 Base) MCG/ACT inhaler Inhale 2 puffs into the lungs every 4 (four) hours as needed. 54 g 3  . ALPRAZolam (XANAX) 0.5 MG tablet 1 Q HS prn 30 tablet 3  . aspirin EC 81 MG tablet Take 81 mg by mouth daily.    . budesonide-formoterol (SYMBICORT) 160-4.5 MCG/ACT inhaler INHALE TWO PUFFS BY MOUTH TWICE DAILY ** STOP SPIRIVA ** 1 each 6  . dorzolamide-timolol (COSOPT) 22.3-6.8 MG/ML ophthalmic solution Place 1 drop into both eyes 2 (two) times daily. 10 mL 0  . lisinopril (ZESTRIL) 5 MG tablet Take 1 tablet (5 mg total) by mouth daily. 90 tablet 1  . mirtazapine (REMERON) 15 MG tablet TAKE 1 TABLET BY MOUTH AT BEDTIME 30 tablet 5  . nitroGLYCERIN (NITROSTAT) 0.4 MG SL tablet 1 sl q 5 min prn chest pressure as directed 50 tablet 3  . pantoprazole (PROTONIX) 40 MG tablet Take 1 tablet (40 mg total) by mouth daily before breakfast. 30 tablet 2  . pravastatin (PRAVACHOL) 40 MG tablet Take 1 tablet (40 mg total) by mouth at bedtime.  90 tablet 1  . Saw Palmetto 450 MG CAPS Take 450 mg by mouth in the morning and at bedtime.    . predniSONE (DELTASONE) 20 MG tablet Take 20-80 mg by mouth daily as needed (for respiratory issues).  (Patient not taking: Reported on 04/25/2020)     No current facility-administered medications for this visit.    Allergies as of 05/02/2020 - Review Complete 05/02/2020  Allergen Reaction Noted  . Amlodipine Itching 12/21/2013  . Chlorzoxazone Itching 03/18/2019    Family History  Problem Relation Age of Onset  . Heart attack Father   . Heart attack Mother   . Lung cancer Brother   . Colon cancer Neg Hx     Social History   Socioeconomic History  . Marital status: Single    Spouse name: Not on file  . Number of children: Not  on file  . Years of education: Not on file  . Highest education level: Not on file  Occupational History  . Not on file  Tobacco Use  . Smoking status: Current Every Day Smoker    Packs/day: 1.00    Years: 66.00    Pack years: 66.00    Types: Cigarettes  . Smokeless tobacco: Never Used  . Tobacco comment: started smoking age 62  Vaping Use  . Vaping Use: Never used  Substance and Sexual Activity  . Alcohol use: No  . Drug use: No  . Sexual activity: Yes    Partners: Female  Other Topics Concern  . Not on file  Social History Narrative  . Not on file   Social Determinants of Health   Financial Resource Strain:   . Difficulty of Paying Living Expenses: Not on file  Food Insecurity:   . Worried About Charity fundraiser in the Last Year: Not on file  . Ran Out of Food in the Last Year: Not on file  Transportation Needs:   . Lack of Transportation (Medical): Not on file  . Lack of Transportation (Non-Medical): Not on file  Physical Activity:   . Days of Exercise per Week: Not on file  . Minutes of Exercise per Session: Not on file  Stress:   . Feeling of Stress : Not on file  Social Connections:   . Frequency of Communication with Friends  and Family: Not on file  . Frequency of Social Gatherings with Friends and Family: Not on file  . Attends Religious Services: Not on file  . Active Member of Clubs or Organizations: Not on file  . Attends Archivist Meetings: Not on file  . Marital Status: Not on file    Review of Systems: Gen: Denies fever, chills, cold or flulike symptoms, lightheadedness, dizziness, presyncope, syncope. CV: Denies chest pain or palpitations. Resp: Chronic SOB.  Mild shortness of breath at rest with increased humidity, otherwise, shortness of breath is with exertion.  Does not require oxygen.  No routine cough.    GI: See HPI Psych: See HPI Heme: See HPI  Physical Exam: BP (!) 171/88   Pulse 74   Temp (!) 96.9 F (36.1 C) (Temporal)   Ht 5\' 8"  (1.727 m)   Wt 129 lb 3.2 oz (58.6 kg)   BMI 19.64 kg/m  General:   Alert and oriented. No distress noted. Pleasant and cooperative. This.  Head:  Normocephalic and atraumatic. Eyes: Conjuctiva clear without scleral icterus. Heart:  S1, S2 present without murmurs appreciated. Lungs: Rhonchi throughout lung lung fields bilaterally.  No wheezes or rales. No distress.  Abdomen:  +BS, soft, non-tender and non-distended. No rebound or guarding. No HSM or masses noted. Msk:  Symmetrical without gross deformities. Normal posture. Extremities:  Without edema. Neurologic:  Alert and  oriented x4 Psych:  Normal mood and affect.

## 2020-05-02 ENCOUNTER — Other Ambulatory Visit: Payer: Self-pay

## 2020-05-02 ENCOUNTER — Encounter: Payer: Self-pay | Admitting: Gastroenterology

## 2020-05-02 ENCOUNTER — Ambulatory Visit: Payer: PPO | Admitting: Gastroenterology

## 2020-05-02 VITALS — BP 171/88 | HR 74 | Temp 96.9°F | Ht 68.0 in | Wt 129.2 lb

## 2020-05-02 DIAGNOSIS — K5900 Constipation, unspecified: Secondary | ICD-10-CM | POA: Diagnosis not present

## 2020-05-02 DIAGNOSIS — R634 Abnormal weight loss: Secondary | ICD-10-CM | POA: Diagnosis not present

## 2020-05-02 NOTE — Progress Notes (Signed)
Cc'ed to pcp °

## 2020-05-02 NOTE — Patient Instructions (Addendum)
Continue eating at least 2 meals a day and try to increase this to 3 meals a day as you are able.   Continue drinking your meal replacements twice daily.   For mild constipation, please add Benefiber or Metamucil daily.  These are fiber supplements that will help with stool consistency.  If the fiber supplement daily do not control her constipation well, you may add in ClearLax 17 g daily.   We will plan to see you back in 6 months.  Do not hesitate to call if you have questions or concerns prior.  It was great to see you today!  Aliene Altes, PA-C [facility]

## 2020-05-02 NOTE — Assessment & Plan Note (Addendum)
79 year old male with history of COPD, HLD, HTN, anxiety/depression, and weight loss/lack of appetite/early satiety dating back to March 2020.  Total of 19 pound weight loss between July 2020-May 2021 with weight as low as 124 pounds.  Today, he is up to 129 pounds.  Appetite continues to be "not that great", but he is eating at least 2 meals a day and drinking meal replacements.  He has had significant work-up thus far including labs in January 2021 with slight chronic elevation of WBC, otherwise CBC, CMP, lipase, PSA, TSH, free T4, CRP all within normal limits.  I FOBT negative in September 2020.  CT A/P without acute findings in October 2020.  EGD March 2021 with esophageal web/noncritical Schatzki's ring not manipulated, small hiatal hernia, inflamed appearing stomach positive for H. Pylori s/p treatment with Prevpac with follow-up H. pylori breath test negative in May 2021. GES within normal limits in July 2021. He had reported depression/lack of interest in activities which is thought to be influencing symptoms.  PCP started him on Remeron 15 mg nightly.  Depression has resolved, but he is not sure if Remeron has helped his appetite.  Also recently tried resuming Protonix 40 mg daily to see if this would help symptoms, but patient has not noticed any change.  Notably, last colonoscopy in 2011 with hyperplastic polyp with recommendations to repeat in 10 years.  Previously offered colonoscopy back in June 2021, but patient called to cancel and August 2021 and prefers not to have colonoscopy at this time.  As weight is improving, will continue to monitor. Etiology is not clear, but I suspect depression was likely playing a role. Can't rule out colonic malignancy, but patient prefers to hold off on colonoscopy for now. We will discontinue Protonix as he has not noticed any difference in symptoms and had no significant GERD symptoms historically (Called to reiterate this to patient as I forgot to write this on  his AVS). Advised that he continue eating at least 2 and try to eat 3 meals a day when he is able. He will also continue meal replacements twice daily. We will follow-up in 6 months.

## 2020-05-02 NOTE — Assessment & Plan Note (Addendum)
Chronic history of mild constipation.  Bowel movements 2-3 days a week at baseline with intermittent hard stools.  No abdominal pain, BRBPR, or melena.  He had been losing weight unintentionally and previously offered colonoscopy, but patient decided to cancel this in August 2021 and prefers not to have colonoscopy.  Weight is currently stable and up 3 pounds over the last 4 months.  Advised to add Benefiber or Metamucil daily.  If this does not control constipation well, may add ClearLax 17 g daily.  Plan to follow-up in 6 months.

## 2020-05-07 ENCOUNTER — Other Ambulatory Visit: Payer: Self-pay | Admitting: Family Medicine

## 2020-05-09 ENCOUNTER — Telehealth: Payer: Self-pay | Admitting: *Deleted

## 2020-05-09 ENCOUNTER — Other Ambulatory Visit: Payer: Self-pay | Admitting: *Deleted

## 2020-05-09 MED ORDER — ONDANSETRON 8 MG PO TBDP: ORAL_TABLET | ORAL | 2 refills | Status: DC

## 2020-05-09 NOTE — Telephone Encounter (Signed)
Pt's friend ( gracie reynolds) called concerned about pt and asked me to call and check on him. States he has not been feeling good and laying around the house but told her he did not need to see the doctor he would be fine. I called pt and told him his friend called concerned and he  states he ate a pizza 2 nights ago and thinks he might have gotten food poisoning. States his stomach started being Upset last night. No appetitie but sipping on pepsi all day, no fever, no vomiting, no diarrhea, no nausea, no abdominal pain. States stomach is just upset and feels a little better today.

## 2020-05-09 NOTE — Telephone Encounter (Signed)
Patient notified, med sent and he will contact us if he feels he needs an appointment tomorrow.

## 2020-05-09 NOTE — Telephone Encounter (Signed)
Please set please send in Zofran 8 mg 1 taken 3 times daily as needed for nausea #15 with 2 refills We can set up an appointment for the patient tomorrow (If he feels he will be better tomorrow he could hold off on the appointment and if not feeling better tomorrow morning to call us)

## 2020-07-07 ENCOUNTER — Other Ambulatory Visit: Payer: Self-pay | Admitting: Family Medicine

## 2020-07-13 ENCOUNTER — Telehealth: Payer: Self-pay | Admitting: Family Medicine

## 2020-07-13 MED ORDER — PREDNISONE 20 MG PO TABS: ORAL_TABLET | ORAL | 0 refills | Status: DC

## 2020-07-13 NOTE — Telephone Encounter (Signed)
Patient is requesting prescription for prednisone 20 mg for upper respiratory  Issues not sick right now but wants to keep some on hand. [facility]

## 2020-07-13 NOTE — Telephone Encounter (Signed)
Medication sent to pharmacy and pt is aware 

## 2020-07-13 NOTE — Telephone Encounter (Signed)
Prednisone 20 mg, 2 daily for 5 days

## 2020-09-12 ENCOUNTER — Other Ambulatory Visit: Payer: Self-pay | Admitting: *Deleted

## 2020-09-12 ENCOUNTER — Ambulatory Visit (INDEPENDENT_AMBULATORY_CARE_PROVIDER_SITE_OTHER): Payer: PPO | Admitting: Family Medicine

## 2020-09-12 ENCOUNTER — Other Ambulatory Visit: Payer: Self-pay

## 2020-09-12 ENCOUNTER — Encounter: Payer: Self-pay | Admitting: Family Medicine

## 2020-09-12 VITALS — BP 114/68 | HR 87 | Temp 97.4°F | Wt 121.6 lb

## 2020-09-12 DIAGNOSIS — I700 Atherosclerosis of aorta: Secondary | ICD-10-CM

## 2020-09-12 DIAGNOSIS — E782 Mixed hyperlipidemia: Secondary | ICD-10-CM | POA: Diagnosis not present

## 2020-09-12 DIAGNOSIS — J449 Chronic obstructive pulmonary disease, unspecified: Secondary | ICD-10-CM

## 2020-09-12 DIAGNOSIS — F5101 Primary insomnia: Secondary | ICD-10-CM

## 2020-09-12 DIAGNOSIS — I10 Essential (primary) hypertension: Secondary | ICD-10-CM | POA: Diagnosis not present

## 2020-09-12 DIAGNOSIS — R634 Abnormal weight loss: Secondary | ICD-10-CM | POA: Diagnosis not present

## 2020-09-12 DIAGNOSIS — J432 Centrilobular emphysema: Secondary | ICD-10-CM | POA: Diagnosis not present

## 2020-09-12 MED ORDER — BUDESONIDE-FORMOTEROL FUMARATE 160-4.5 MCG/ACT IN AERO: INHALATION_SPRAY | RESPIRATORY_TRACT | 6 refills | Status: DC

## 2020-09-12 MED ORDER — MIRTAZAPINE 7.5 MG PO TABS: ORAL_TABLET | ORAL | 5 refills | Status: DC

## 2020-09-12 MED ORDER — LISINOPRIL 2.5 MG PO TABS: 2.5000 mg | ORAL_TABLET | Freq: Every day | ORAL | 1 refills | Status: DC

## 2020-09-12 MED ORDER — PRAVASTATIN SODIUM 40 MG PO TABS: 40.0000 mg | ORAL_TABLET | Freq: Every day | ORAL | 1 refills | Status: DC

## 2020-09-12 MED ORDER — ALBUTEROL SULFATE HFA 108 (90 BASE) MCG/ACT IN AERS: 2.0000 | INHALATION_SPRAY | RESPIRATORY_TRACT | 3 refills | Status: DC | PRN

## 2020-09-12 MED ORDER — ALPRAZOLAM 0.5 MG PO TABS: ORAL_TABLET | ORAL | 3 refills | Status: DC

## 2020-09-12 NOTE — Progress Notes (Signed)
Subjective:    Patient ID: George Meyer, male    DOB: [birth year], 80 y.o.   MRN: [id number]  Hypertension This is a chronic problem. Pertinent negatives include no chest pain, headaches or shortness of breath. There are no compliance problems.   Pt states he is doing well; no issues with blood pressure.   Eating small amounts No appetite Moods good  Primary hypertension - Plan: CBC with Differential, Comprehensive Metabolic Panel (CMET), Lipid Profile  Primary insomnia - Plan: CBC with Differential, Comprehensive Metabolic Panel (CMET), Lipid Profile  Centrilobular emphysema (HCC) - Plan: CBC with Differential, Comprehensive Metabolic Panel (CMET), Lipid Profile  Aortic atherosclerosis (HCC) - Plan: CBC with Differential, Comprehensive Metabolic Panel (CMET), Lipid Profile  Weight loss, unintentional - Plan: CBC with Differential, Comprehensive Metabolic Panel (CMET), Lipid Profile  Mixed hyperlipidemia - Plan: CBC with Differential, Comprehensive Metabolic Panel (CMET), Lipid Profile  Patient is taking his cholesterol medicine as directed tolerating it well watching diet Takes his blood pressure medicine Appetite is been on the low end He does try to work hard at eating extra snacks With his advanced age and COPD he has been losing weight He has had a previous CT scan of the chest abdomen and gastric emptying study and EGD without any specific diagnosis   Review of Systems  Constitutional: Negative for activity change.  HENT: Negative for congestion and rhinorrhea.   Respiratory: Negative for cough and shortness of breath.   Cardiovascular: Negative for chest pain.  Gastrointestinal: Negative for abdominal pain, diarrhea, nausea and vomiting.  Genitourinary: Negative for dysuria and hematuria.  Neurological: Negative for weakness and headaches.  Psychiatric/Behavioral: Negative for behavioral problems and confusion.       Objective:   Physical Exam Vitals  reviewed.  Constitutional:      General: He is not in acute distress. HENT:     Head: Normocephalic and atraumatic.  Eyes:     General:        Right eye: No discharge.        Left eye: No discharge.  Neck:     Trachea: No tracheal deviation.  Cardiovascular:     Rate and Rhythm: Normal rate and regular rhythm.     Heart sounds: Normal heart sounds. No murmur heard.   Pulmonary:     Effort: Pulmonary effort is normal. No respiratory distress.     Breath sounds: Normal breath sounds.  Lymphadenopathy:     Cervical: No cervical adenopathy.  Skin:    General: Skin is warm and dry.  Neurological:     Mental Status: He is alert.     Coordination: Coordination normal.  Psychiatric:        Behavior: Behavior normal.           Assessment & Plan:  1. Primary hypertension Blood pressure no need for the current dose reduce it down to 2.5 recheck again in 3 to 4 months - CBC with Differential - Comprehensive Metabolic Panel (CMET) - Lipid Profile  2. Primary insomnia May use medication at nighttime as needed to help him with sleep avoid additional use during the day - CBC with Differential - Comprehensive Metabolic Panel (CMET) - Lipid Profile  3. Centrilobular emphysema (HCC) COPD stable continue current inhalers encourage patient to quit smoking and is unlikely he will do so - CBC with Differential - Comprehensive Metabolic Panel (CMET) - Lipid Profile  4. Aortic atherosclerosis ([place]) History aortic atherosclerosis taking cholesterol medicine continue healthy eating check labs -  CBC with Differential - Comprehensive Metabolic Panel (CMET) - Lipid Profile  5. Weight loss, unintentional Improves Oral intake.  Previous work-up negative for cancer. - CBC with Differential - Comprehensive Metabolic Panel (CMET) - Lipid Profile  6. Mixed hyperlipidemia History hyperlipidemia take medication watch diet - CBC with Differential - Comprehensive Metabolic Panel  (CMET) - Lipid Profile With his weight loss and COPD do a repeat chest x-ray I do not feel the patient needs CT scan currently Recheck 3 months

## 2020-09-12 NOTE — Addendum Note (Signed)
Addended by: Vicente Males on: 09/12/2020 01:40 PM   Modules accepted: Orders

## 2020-09-12 NOTE — Progress Notes (Signed)
Chest xray ordered; left message to return call

## 2020-09-12 NOTE — Progress Notes (Signed)
Discussed with pt. Pt verbalized understanding.  °

## 2020-09-19 ENCOUNTER — Ambulatory Visit (HOSPITAL_COMMUNITY)
Admission: RE | Admit: 2020-09-19 | Discharge: 2020-09-19 | Disposition: A | Discharge status: Home / Self Care | Payer: PPO | Source: Ambulatory Visit | Attending: Family Medicine | Admitting: Family Medicine

## 2020-09-19 ENCOUNTER — Other Ambulatory Visit: Payer: Self-pay

## 2020-09-19 DIAGNOSIS — J449 Chronic obstructive pulmonary disease, unspecified: Secondary | ICD-10-CM | POA: Diagnosis not present

## 2020-09-19 DIAGNOSIS — F5101 Primary insomnia: Secondary | ICD-10-CM | POA: Diagnosis not present

## 2020-09-19 DIAGNOSIS — E782 Mixed hyperlipidemia: Secondary | ICD-10-CM | POA: Diagnosis not present

## 2020-09-19 DIAGNOSIS — J432 Centrilobular emphysema: Secondary | ICD-10-CM | POA: Diagnosis not present

## 2020-09-19 DIAGNOSIS — R634 Abnormal weight loss: Secondary | ICD-10-CM | POA: Diagnosis not present

## 2020-09-19 DIAGNOSIS — J439 Emphysema, unspecified: Secondary | ICD-10-CM | POA: Diagnosis not present

## 2020-09-19 DIAGNOSIS — I700 Atherosclerosis of aorta: Secondary | ICD-10-CM | POA: Diagnosis not present

## 2020-09-19 DIAGNOSIS — I10 Essential (primary) hypertension: Secondary | ICD-10-CM | POA: Diagnosis not present

## 2020-09-20 LAB — LIPID PANEL
Chol/HDL Ratio: 3.4 ratio (ref 0.0–5.0)
Cholesterol, Total: 171 mg/dL (ref 100–199)
HDL: 51 mg/dL (ref >39)
LDL Chol Calc (NIH): 98 mg/dL (ref 0–99)
Triglycerides: 125 mg/dL (ref 0–149)
VLDL Cholesterol Cal: 22 mg/dL (ref 5–40)

## 2020-09-20 LAB — CBC WITH DIFFERENTIAL/PLATELET
Basophils Absolute: 0.1 10*3/uL (ref 0.0–0.2)
Basos: 1 %
EOS (ABSOLUTE): 1 10*3/uL — ABNORMAL HIGH (ref 0.0–0.4)
Eos: 9 %
Hematocrit: 51.3 % — ABNORMAL HIGH (ref 37.5–51.0)
Hemoglobin: 17.6 g/dL (ref 13.0–17.7)
Immature Grans (Abs): 0 10*3/uL (ref 0.0–0.1)
Immature Granulocytes: 0 %
Lymphocytes Absolute: 2.1 10*3/uL (ref 0.7–3.1)
Lymphs: 19 %
MCH: 31.5 pg (ref 26.6–33.0)
MCHC: 34.3 g/dL (ref 31.5–35.7)
MCV: 92 fL (ref 79–97)
Monocytes Absolute: 0.9 10*3/uL (ref 0.1–0.9)
Monocytes: 8 %
Neutrophils Absolute: 6.8 10*3/uL (ref 1.4–7.0)
Neutrophils: 63 %
Platelets: 378 10*3/uL (ref 150–450)
RBC: 5.59 x10E6/uL (ref 4.14–5.80)
RDW: 13 % (ref 11.6–15.4)
WBC: 11 10*3/uL — ABNORMAL HIGH (ref 3.4–10.8)

## 2020-09-20 LAB — COMPREHENSIVE METABOLIC PANEL
ALT: 6 IU/L (ref 0–44)
AST: 11 IU/L (ref 0–40)
Albumin/Globulin Ratio: 1.7 (ref 1.2–2.2)
Albumin: 4.3 g/dL (ref 3.6–4.6)
Alkaline Phosphatase: 70 IU/L (ref 44–121)
BUN/Creatinine Ratio: 12 (ref 10–24)
BUN: 9 mg/dL (ref 8–27)
Bilirubin Total: 0.5 mg/dL (ref 0.0–1.2)
CO2: 28 mmol/L (ref 20–29)
Calcium: 9.3 mg/dL (ref 8.6–10.2)
Chloride: 100 mmol/L (ref 96–106)
Creatinine, Ser: 0.74 mg/dL — ABNORMAL LOW (ref 0.76–1.27)
Globulin, Total: 2.5 g/dL (ref 1.5–4.5)
Glucose: 93 mg/dL (ref 65–99)
Potassium: 5.1 mmol/L (ref 3.5–5.2)
Sodium: 142 mmol/L (ref 134–144)
Total Protein: 6.8 g/dL (ref 6.0–8.5)
eGFR: 91 mL/min/{1.73_m2} (ref >59)

## 2020-09-23 ENCOUNTER — Telehealth: Payer: Self-pay | Admitting: Family Medicine

## 2020-09-23 MED ORDER — ALBUTEROL SULFATE HFA 108 (90 BASE) MCG/ACT IN AERS: 2.0000 | INHALATION_SPRAY | RESPIRATORY_TRACT | 0 refills | Status: DC | PRN

## 2020-09-23 NOTE — Telephone Encounter (Signed)
Patient is requesting refill albuterol ventolin called into [facility] [place] [facility].

## 2020-09-23 NOTE — Telephone Encounter (Signed)
6 refills please 

## 2020-09-23 NOTE — Telephone Encounter (Signed)
Last seen 09/12/20

## 2020-09-23 NOTE — Telephone Encounter (Addendum)
Prescription sent electronically to pharmacy. Patient notifed

## 2020-09-26 ENCOUNTER — Telehealth: Payer: Self-pay

## 2020-09-26 NOTE — Telephone Encounter (Signed)
That is a significant weight loss since our last visit in November 2021 where he weighed 129 lbs. We need to get him in the office to evaluate and for objective weight measurement. Our first available is 8 am on Thursday with Randall Hiss. Patient is ok with this appointment. He tells me his Remeron was decreased by half recently and since then, he has had more of a decline in his appetite. I have advised he resume Remeron 15 mg at bedtime. Further recommendations at the time of office visit.   Manuela Schwartz: Please schedule patient to see Walden Field, NP on Thursday 3/31 at 8 am. Dx: Weight loss

## 2020-09-26 NOTE — Telephone Encounter (Signed)
OV made and patient aware

## 2020-09-26 NOTE — Telephone Encounter (Signed)
Pt called with c/o weight loss. Pt weighed himself without clothing this morning and was 112 lb. Pt doesn't have much of an appetite. Pt takes small bites of food and doesn't want to eat. Pt reports no nausea, no vomiting, no dizziness. Pt said his energy level is down a little but that's to be expected at 80 per pt. Our office didn't have any appointments until the end of April. Pt is going to also call his PCP.

## 2020-09-29 ENCOUNTER — Ambulatory Visit: Payer: PPO | Admitting: Family Medicine

## 2020-09-29 ENCOUNTER — Other Ambulatory Visit: Payer: Self-pay

## 2020-09-29 ENCOUNTER — Ambulatory Visit: Payer: PPO | Admitting: Nurse Practitioner

## 2020-09-29 ENCOUNTER — Encounter: Payer: Self-pay | Admitting: Nurse Practitioner

## 2020-09-29 VITALS — BP 135/80 | HR 74 | Temp 97.5°F | Ht 68.0 in | Wt 120.0 lb

## 2020-09-29 DIAGNOSIS — R630 Anorexia: Secondary | ICD-10-CM | POA: Diagnosis not present

## 2020-09-29 DIAGNOSIS — Z604 Social exclusion and rejection: Secondary | ICD-10-CM | POA: Diagnosis not present

## 2020-09-29 DIAGNOSIS — R634 Abnormal weight loss: Secondary | ICD-10-CM | POA: Diagnosis not present

## 2020-09-29 DIAGNOSIS — K5900 Constipation, unspecified: Secondary | ICD-10-CM | POA: Diagnosis not present

## 2020-09-29 DIAGNOSIS — F321 Major depressive disorder, single episode, moderate: Secondary | ICD-10-CM | POA: Diagnosis not present

## 2020-09-29 DIAGNOSIS — R636 Underweight: Secondary | ICD-10-CM | POA: Diagnosis not present

## 2020-09-29 NOTE — Progress Notes (Signed)
Referring Provider: Kathyrn Drown, MD Primary Care Physician:  Kathyrn Drown, MD Primary GI:  Dr. Gala Romney  Chief Complaint  Patient presents with  . loss of weight    08/2019 weighted around 160lbs  . Constipation    BM's about once a week  . Anorexia    HPI:   George Meyer is a 80 y.o. male who presents for follow-up on weight loss and constipation.  Patient was last seen in our office 05/02/2020 for the same.  History regarding weight loss/early satiety: Symptoms started around March 2020 when having to stay at home due to COVID-19. Associated loss of interest in activities. Used to be very active in church but this was on hold. Labs in January 2021with slight chronic elevation of WBC, otherwise CBC, CMP, lipase, PSA, TSH, free T4, C-reactive protein within normal limits. IFOBT negativein September 2020.CT A/P without acute findings in October 2020.EGD 09/17/2019 with esophageal web/noncritical Schatzki's ring notmanipulated, small hiatal hernia, inflamed appearing stomach positive for H. pylori, normal examined duodenum. He was treated with Prevpac x14 days. H. pylori breath test negative on 11/03/2019. Last colonoscopy in 2011 with hyperplastic polypwith recommendations to repeat in 10 years.  Primary care refer patient to endocrinology for further evaluation.  He had appointment scheduled for 02/01/2020, but canceled this.  At a follow-up visit he was doing better, has been started on Remeron but without noted difference in depression.  His lack of interest was improving.  Continued early satiety but no other upper GI symptoms and regular bowel movements without alarm symptoms.  Underwent GES 01/12/2020 within normal limits, started Protonix back to 40 mg daily to trial due to query possible mild gastritis.  Also recommended repeating colonoscopy which was scheduled, but the patient called later to cancel.  At his last visit noted he is feeling great but appetite is still  so-so but he make sure that he eats 2 meals a day and drinks 2 meal replacements a day.  No upper GI symptoms, has never had GERD symptoms.  Taking Protonix but not noticed a difference other than increased gas.  Generally only has bowel movements 2 to 3 days a week with stools starting out hard but then progressing to soft which has been his baseline for a number of years has not needed MiraLAX in a number of months.  No blood or black stools.  He canceled his endocrinology appointment because he is feeling fine.  No further depression.  Recommended continue 2 meals a day and increase to 3 meals a day as able, continue meal replacements twice a day.  Use Benefiber or Metamucil daily for mild constipation, can add MiraLAX daily as needed, follow-up in 6 months.  He called our office 09/26/2020 indicating he is having persistent weight loss, poor appetite, no desire to eat.  Energy level is low but he attributes this to age.  Objectively, based on the patient's report of his weight at home, he seems to be down another 17 pounds.  The patient did note his Remeron dose was decreased in half and he has had a significant decline in appetite since so he was advised to resume his full strength Remeron.  Today he states he doing okay overall. He states at home he has weighed as much as 160. When he weighed at home with no clothes on he was 112 lb. Today he is 120 lb on our scale and was 129 lb 4 months ago (9lb weight loss in  4 months). He states his appetite fell off significantly when Remeron was cut in half (last week). He is out of town this week and hasn't been able to communicate the change. He thinks the crux of the problem is boredom and isolation; was previously heavily involved in the church and community and with COVID-19 things "were shut down." He states his church is now down to 13 members and they're not allowed back in the nursing home to volunteer, so "I guess I need to learn to like me better." Denies  abdominal pain, N/V, hematochezia, melena, fever, chills. Denies URI or flu-like symptoms. Denies loss of sense of taste or smell. The patient has received COVID-19 vaccination(s). They have had a booster dose as well. Denies chest pain, dyspnea, dizziness, lightheadedness, syncope, near syncope. Denies any other upper or lower GI symptoms.  He has been having constipation with a bowel movement about once a week. Uses laxatives as needed, but doesn't like to take them. He takes a stool softener (dulcolax). Drinks minimal water. Also minimal fiber.  Past Medical History:  Diagnosis Date  . Anxiety   . Cataracts, bilateral    removed by surgery  . COPD (chronic obstructive pulmonary disease) ([place])   . Depression, major, single episode, moderate ([place]) 12/18/2018  . Glaucoma    both eyes  . Glaucoma    bilateral  . H. pylori infection 09/17/2019   S/p treatment with Prevpac. Documented eradiation via breath test on 11/03/19.   Marland Kitchen Hyperlipidemia   . Hypertension   . Pre-diabetes    diet controlled  . Skin cancer, basal cell    arms, face  . Wears dentures    full    Past Surgical History:  Procedure Laterality Date  . BIOPSY  09/17/2019   Procedure: BIOPSY;  Surgeon: Daneil Dolin, MD;  [facility];  Service: Endoscopy;;  gastric  . CARDIAC CATHETERIZATION N/A 12/2005   negative  . COLONOSCOPY N/A 12/2009   small hyperplastic polyp repeat in 10 years  . DUPUYTREN CONTRACTURE RELEASE Left 02/02/2019   Procedure: EXCISION LEFT HAND AND SMALL FINGER DUPUYTREN CONTRACTURE RELEASE;  Surgeon: Marybelle Killings, MD;  Location: [facility];  Service: Orthopedics;  Laterality: Left;  . ESOPHAGOGASTRODUODENOSCOPY (EGD) WITH PROPOFOL N/A 09/17/2019   Procedure: ESOPHAGOGASTRODUODENOSCOPY (EGD) WITH PROPOFOL;  Surgeon: Daneil Dolin, MD; esophageal labs/Schatzki's ring noncritical, not manipulated.  Small hiatal hernia.  Inflamed stomach with biopsies revealing chronic active gastritis with H.  pylori.  H. pylori treated with Prevpac.  Marland Kitchen EYE SURGERY Bilateral    remove cataracts  . HAND SURGERY Right 2010   dupuytrens excision right little finger  . TONSILLECTOMY    . WISDOM TOOTH EXTRACTION      Current Outpatient Medications  Medication Sig Dispense Refill  . albuterol (PROVENTIL) (2.5 MG/3ML) 0.083% nebulizer solution USE 1 VIAL IN NEBULIZER EVERY 4 HOURS AS NEEDED FOR WHEEZING 300 mL 3  . albuterol (VENTOLIN HFA) 108 (90 Base) MCG/ACT inhaler Inhale 2 puffs into the lungs every 4 (four) hours as needed. 54 g 0  . ALPRAZolam (XANAX) 0.5 MG tablet 1 Q HS prn 30 tablet 3  . aspirin EC 81 MG tablet Take 81 mg by mouth daily.    . budesonide-formoterol (SYMBICORT) 160-4.5 MCG/ACT inhaler INHALE TWO PUFFS BY MOUTH TWICE DAILY ** STOP SPIRIVA ** 1 each 6  . dorzolamide-timolol (COSOPT) 22.3-6.8 MG/ML ophthalmic solution Place 1 drop into both eyes 2 (two) times daily. 10 mL 0  .  lisinopril (ZESTRIL) 2.5 MG tablet Take 1 tablet (2.5 mg total) by mouth daily. 90 tablet 1  . mirtazapine (REMERON) 7.5 MG tablet TAKE 1 TABLET BY MOUTH AT BEDTIME *STOP SERTRALINE 30 tablet 5  . nitroGLYCERIN (NITROSTAT) 0.4 MG SL tablet 1 sl q 5 min prn chest pressure as directed 50 tablet 3  . ondansetron (ZOFRAN ODT) 8 MG disintegrating tablet Take 1 tablet 3 x daily has needed 15 tablet 2  . pantoprazole (PROTONIX) 40 MG tablet Take 1 tablet (40 mg total) by mouth daily before breakfast. 30 tablet 2  . pravastatin (PRAVACHOL) 40 MG tablet Take 1 tablet (40 mg total) by mouth at bedtime. 90 tablet 1  . predniSONE (DELTASONE) 20 MG tablet Take 20-80 mg by mouth daily as needed (for respiratory issues).    . Saw Palmetto 450 MG CAPS Take 450 mg by mouth in the morning and at bedtime.    . predniSONE (DELTASONE) 20 MG tablet Take 2 tablets po daily for 5 days (Patient not taking: Reported on 09/29/2020) 10 tablet 0   No current facility-administered medications for this visit.    Allergies as of  09/29/2020 - Review Complete 09/29/2020  Allergen Reaction Noted  . Amlodipine Itching 12/21/2013  . Chlorzoxazone Itching 03/18/2019    Family History  Problem Relation Age of Onset  . Heart attack Father   . Heart attack Mother   . Lung cancer Brother   . Colon cancer Neg Hx     Social History   Socioeconomic History  . Marital status: Single    Spouse name: Not on file  . Number of children: Not on file  . Years of education: Not on file  . Highest education level: Not on file  Occupational History  . Not on file  Tobacco Use  . Smoking status: Current Every Day Smoker    Packs/day: 1.00    Years: 66.00    Pack years: 66.00    Types: Cigarettes  . Smokeless tobacco: Never Used  . Tobacco comment: started smoking age 84  Vaping Use  . Vaping Use: Never used  Substance and Sexual Activity  . Alcohol use: No  . Drug use: No  . Sexual activity: Yes    Partners: Female  Other Topics Concern  . Not on file  Social History Narrative  . Not on file   Social Determinants of Health   Financial Resource Strain: Not on file  Food Insecurity: Not on file  Transportation Needs: Not on file  Physical Activity: Not on file  Stress: Not on file  Social Connections: Not on file    Subjective: Review of Systems  Constitutional: Positive for weight loss. Negative for chills, fever and malaise/fatigue.  HENT: Negative for congestion and sore throat.   Respiratory: Negative for cough and shortness of breath.   Cardiovascular: Negative for chest pain and palpitations.  Gastrointestinal: Positive for constipation. Negative for abdominal pain, blood in stool, diarrhea, heartburn, melena, nausea and vomiting.  Musculoskeletal: Negative for joint pain and myalgias.  Skin: Negative for rash.  Neurological: Negative for dizziness and weakness.  Endo/Heme/Allergies: Does not bruise/bleed easily.  Psychiatric/Behavioral: Negative for depression. The patient is not  nervous/anxious.   All other systems reviewed and are negative.    Objective: BP 135/80   Pulse 74   Temp (!) 97.5 F (36.4 C)   Ht _0  (1.727 m)   Wt 120 lb (54.4 kg)   BMI 18.25 kg/m  Physical Exam Vitals and  nursing note reviewed.  Constitutional:      General: He is not in acute distress.    Appearance: Normal appearance. He is underweight. He is not ill-appearing, toxic-appearing or diaphoretic.  HENT:     Head: Normocephalic and atraumatic.     Nose: No congestion or rhinorrhea.  Eyes:     General: No scleral icterus. Cardiovascular:     Rate and Rhythm: Normal rate and regular rhythm.     Heart sounds: Normal heart sounds.  Pulmonary:     Effort: Pulmonary effort is normal.     Breath sounds: Normal breath sounds.  Abdominal:     General: Bowel sounds are normal. There is no distension.     Palpations: Abdomen is soft. There is no hepatomegaly, splenomegaly or mass.     Tenderness: There is no abdominal tenderness. There is no guarding or rebound.     Hernia: No hernia is present.  Musculoskeletal:     Cervical back: Neck supple.  Skin:    General: Skin is warm and dry.     Coloration: Skin is not jaundiced.     Findings: No bruising or rash.  Neurological:     General: No focal deficit present.     Mental Status: He is alert and oriented to person, place, and time. Mental status is at baseline.  Psychiatric:        Mood and Affect: Mood normal.        Behavior: Behavior normal.        Thought Content: Thought content normal.      Assessment:  Very pleasant 80 year old male who presents to follow-up on constipation and weight loss in the setting of poor appetite and social isolation related to COVID-19 pandemic.  Previously he seemed to be doing well with increasing weight.  Subsequently, I think his primary care cut his Remeron/mirtazapine dose in half to 7.5 mg in the evening.  Since then he is continue to lose more weight.  He is down an additional 9  pounds in the past 4 months.  He states specifically his appetite fell off significantly when Remeron was reduced.  Try to call his primary care but they are out of the office/out of town for this week.  Overall he feels the crux of his ongoing weight loss is boredom and social isolation.  He was previously heavily involved in church in the community but that SHUTDOWN during the pandemic.  Things have not opened up much recently.  We discussed other possible opportunities for involvement while maintaining safety with Covid such as volunteering at the local hospital.  He states he will look into this.  No abdominal pain, vomiting, blood in stools, or other red flag/warning signs or symptoms other than his weight loss.  He was previously recommended to have an updated colonoscopy but he put this off because he was doing better.  He is not doing as well, we discussed that again he is agreeable to proceed.  He has had a significant work-up including CT of the abdomen pelvis, gastric emptying study, labs all of which were unremarkable.  EGD has also been recently updated in March 2021 as outlined in HPI.  I feel completing a colonoscopy will wrap up valuation for significant ongoing disease process.  I recommended he follow-up primary care when you are back in town for other possibilities.  I am temporarily increasing his mirtazapine dose back to 50 mg daily to see if we get some relief, further refills and  recommendations from primary care.  Of note, I am hesitant to start any appetite stimulant due to risks in the elderly with these medications.  Constipation: He has been having some increased constipation.  He admits he drinks minimal water and does not eat enough fiber.  At this point I will encourage him to increase his water intake to at least 60 ounces a day and start a fiber supplement once a day.  Further recommendations to follow based on his clinical response.   Proceed with colonoscopy on propofol/MAC  by Dr. Gala Romney in near future: the risks, benefits, and alternatives have been discussed with the patient in detail. The patient states understanding and desires to proceed.  The patient is currently on Remeron and Xanax. The patient is not on any other anticoagulants, anxiolytics, chronic pain medications, antidepressants, antidiabetics, or iron supplements.  We will plan for the procedure on propofol/MAC to promote adequate sedation.  ASA III   Plan: 1. Increase Remeron to 15 mg a day at bedtime for now 2. Call Dr. Wolfgang Phoenix as soon as you can to discuss whether to keep this dose ongoing for for any further needed workup 3. Increase your water intake to 60 ounces a day 4. Start a fiber supplement 5. Colonoscopy as described above 6. Further recommendations to follow    Thank you for allowing [place] to participate in the care of [place], DNP, AGNP-C Adult & Gerontological Nurse Practitioner [facility]   09/29/2020 3:14 PM   Disclaimer: This note was dictated with voice recognition software. Similar sounding words can inadvertently be transcribed and may not be corrected upon review.

## 2020-09-29 NOTE — Patient Instructions (Signed)
Your health issues we discussed today were:   Constipation: 1. As we discussed, try to increase your water intake 2. You should strive to drink 60 ounces of water a day 3. Start taking a fiber supplement.  There are multiple options available.  You can discuss options with the pharmacist 4. Some options include gunny chews, fruit juice, powders, capsules 5. The best fiber supplement is the one that she will tolerate and likely take ongoing 6. Start taking fiber once a day.  You can increase to twice a day after 2 weeks 7. Call us for any worsening or severe symptoms  Continued weight loss in the setting of social isolation and depression: 1. As we discussed, while Dr. Wolfgang Phoenix is out of town, increase your Remeron (mirtazapine) to 15 mg a day, at bedtime 2. As soon as Dr. Wolfgang Phoenix is back in town, call his office and request to be seen to further address 3. We will schedule a colonoscopy to evaluate for any other causes of your weight loss 4. Further recommendations will follow your colonoscopy 5. Continue to eat a meal replacement/supplement at least twice a day, 3 times a day if you are able to  Overall I recommend:  1. Continue other current medications 2. Return for follow-up in 2 months 3. Call us for any questions or concerns   ---------------------------------------------------------------  I am glad you have gotten your COVID-19 vaccination!  Even though you are fully vaccinated you should continue to follow CDC and state/local guidelines.  ---------------------------------------------------------------   At [facility] we value your feedback. You may receive a survey about your visit today. Please share your experience as we strive to create trusting relationships with our patients to provide genuine, compassionate, quality care.  We appreciate your understanding and patience as we review any laboratory studies, imaging, and other diagnostic tests that are  ordered as we care for you. Our office policy is 5 business days for review of these results, and any emergent or urgent results are addressed in a timely manner for your best interest. If you do not hear from our office in 1 week, please contact us.   We also encourage the use of MyChart, which contains your medical information for your review as well. If you are not enrolled in this feature, an access code is on this after visit summary for your convenience. Thank you for allowing [place] to be involved in your care.  It was great to see you today!  I hope you have a great spring!!

## 2020-09-29 NOTE — Progress Notes (Signed)
Cc'ed to pcp °

## 2020-10-02 ENCOUNTER — Telehealth: Payer: Self-pay | Admitting: Family Medicine

## 2020-10-02 NOTE — Telephone Encounter (Signed)
Set him up for a follow-up in the near future

## 2020-10-03 ENCOUNTER — Telehealth: Payer: Self-pay

## 2020-10-03 NOTE — Telephone Encounter (Signed)
May increase the mirtazapine to 15 mg increase, #30, 1 each evening, 4 refills dietary intake Nurses-please be certain that there is no emergency issues going on currently.  Make sure patient is not suicidal.  Otherwise may have 1130/1140 appointment for the 2 weeks from now for recheck

## 2020-10-03 NOTE — Telephone Encounter (Signed)
George Meyer was put on mirtazapine (REMERON) 7.5 MG tablet  And this has cut George Meyer appetite out he has lost 10 pounds in the past week. Pt is down to 110. And wanting George Meyer to check on this.  Pt call back [phone]

## 2020-10-03 NOTE — Telephone Encounter (Signed)
Patient states that he has lost 50 lb - 10 lbs in last week- went 3 days without eating last week and just doesn't want to eat- has started doubling his meds and he has a slight appetite. Patient states he has no thoughts of harming self or others

## 2020-10-04 ENCOUNTER — Telehealth: Payer: Self-pay | Admitting: Family Medicine

## 2020-10-04 NOTE — Telephone Encounter (Signed)
He may have 11:40 he can come at 11:30 thanks

## 2020-10-04 NOTE — Telephone Encounter (Signed)
Have patient come on Thursday at 1130 I will see him before lunch thank you

## 2020-10-04 NOTE — Telephone Encounter (Signed)
You already have patient at 11:20 on Thursday no more appointment this month they are all full at 11:20 please advise

## 2020-10-05 ENCOUNTER — Telehealth: Payer: Self-pay | Admitting: Internal Medicine

## 2020-10-05 NOTE — Telephone Encounter (Signed)
Pt seen George Field, NP recently and called to let us know that he has decided not to have a colonoscopy. He is following up with his PCP tomorrow and will see [place] in June.

## 2020-10-05 NOTE — Telephone Encounter (Signed)
Noted  

## 2020-10-05 NOTE — Telephone Encounter (Signed)
Error. Nurse please close

## 2020-10-05 NOTE — Telephone Encounter (Signed)
Noted FYI to eric

## 2020-10-06 ENCOUNTER — Encounter: Payer: Self-pay | Admitting: Family Medicine

## 2020-10-06 ENCOUNTER — Ambulatory Visit (INDEPENDENT_AMBULATORY_CARE_PROVIDER_SITE_OTHER): Payer: PPO | Admitting: Family Medicine

## 2020-10-06 ENCOUNTER — Other Ambulatory Visit: Payer: Self-pay

## 2020-10-06 VITALS — BP 126/80 | Temp 97.2°F | Wt 120.6 lb

## 2020-10-06 DIAGNOSIS — Z1211 Encounter for screening for malignant neoplasm of colon: Secondary | ICD-10-CM | POA: Diagnosis not present

## 2020-10-06 DIAGNOSIS — J432 Centrilobular emphysema: Secondary | ICD-10-CM

## 2020-10-06 MED ORDER — ALPRAZOLAM 0.25 MG PO TABS
ORAL_TABLET | ORAL | 5 refills | Status: DC
Start: 2020-10-06 — End: 2020-12-13

## 2020-10-06 MED ORDER — MIRTAZAPINE 15 MG PO TABS: ORAL_TABLET | ORAL | 5 refills | Status: DC

## 2020-10-06 NOTE — Progress Notes (Signed)
   Subjective:    Patient ID: George Meyer, male    DOB: [birth year], 80 y.o.   MRN: [id number]  HPI Pt here to discuss weight loss. Pt states he has lost 50 lbs in the past year. Pt was placed on Mirtazapine and that did give him some appetite then dosage was decreased and appetite went away. Pt is drinking plenty of fluids.  Patient is having unintended weight loss.  Patient is relating that he is trying to take an increase calories He states mirtazapine was at a lower dose for several weeks and his appetite dropped off therefore he bumped back up the dose would like to have the prescription back to 15 mg He does relate occasionally getting dizzy   Review of Systems See above    Objective:   Physical Exam  Lungs clear heart regular pulse normal extremities no edema skin warm dry neurologic grossly normal  25 minutes spent with patient discussing multiple issues regarding the weight loss increase calories COPD and calorie expenditure with COPD    Assessment & Plan:  COPD Weight loss more than likely associated with the COPD Encourage patient to quit smoking Encourage him to get in increase calories Follow-up again in 6 weeks No need for additional scans at this point previous scans recently done lab work recently done Initially patient interested in colonoscopy now he is not We will check stool for blood

## 2020-10-10 NOTE — Addendum Note (Signed)
Addended by: Vicente Males on: 10/10/2020 04:30 PM   Modules accepted: Orders

## 2020-10-10 NOTE — Progress Notes (Signed)
10/10/20-pt contacted and verbalized understanding. Test placed up front for patient.

## 2020-10-11 ENCOUNTER — Other Ambulatory Visit: Payer: Self-pay | Admitting: *Deleted

## 2020-10-11 ENCOUNTER — Other Ambulatory Visit: Payer: Self-pay

## 2020-10-11 DIAGNOSIS — Z1211 Encounter for screening for malignant neoplasm of colon: Secondary | ICD-10-CM

## 2020-10-11 DIAGNOSIS — K921 Melena: Secondary | ICD-10-CM

## 2020-10-11 LAB — IFOBT (OCCULT BLOOD): IFOBT: POSITIVE

## 2020-10-12 ENCOUNTER — Other Ambulatory Visit: Payer: Self-pay | Admitting: *Deleted

## 2020-10-12 ENCOUNTER — Telehealth: Payer: Self-pay

## 2020-10-12 MED ORDER — BUDESONIDE-FORMOTEROL FUMARATE 160-4.5 MCG/ACT IN AERO: INHALATION_SPRAY | RESPIRATORY_TRACT | 6 refills | Status: DC

## 2020-10-12 NOTE — Telephone Encounter (Signed)
George Meyer wants a refill sent of Symbicort sent to [place]   Pt call back [phone]

## 2020-10-12 NOTE — Telephone Encounter (Signed)
Refills sent and pt was notified.  

## 2020-10-20 DIAGNOSIS — Z23 Encounter for immunization: Secondary | ICD-10-CM | POA: Diagnosis not present

## 2020-11-02 ENCOUNTER — Ambulatory Visit: Payer: PPO | Admitting: Gastroenterology

## 2020-11-03 ENCOUNTER — Ambulatory Visit (INDEPENDENT_AMBULATORY_CARE_PROVIDER_SITE_OTHER): Payer: PPO | Admitting: Family Medicine

## 2020-11-03 ENCOUNTER — Other Ambulatory Visit: Payer: Self-pay

## 2020-11-03 VITALS — BP 126/82 | Temp 97.1°F | Ht 68.0 in | Wt 121.0 lb

## 2020-11-03 DIAGNOSIS — E611 Iron deficiency: Secondary | ICD-10-CM | POA: Diagnosis not present

## 2020-11-03 DIAGNOSIS — J449 Chronic obstructive pulmonary disease, unspecified: Secondary | ICD-10-CM | POA: Diagnosis not present

## 2020-11-03 DIAGNOSIS — K921 Melena: Secondary | ICD-10-CM

## 2020-11-03 DIAGNOSIS — R6881 Early satiety: Secondary | ICD-10-CM

## 2020-11-03 DIAGNOSIS — R634 Abnormal weight loss: Secondary | ICD-10-CM

## 2020-11-03 NOTE — Progress Notes (Signed)
Subjective:    Patient ID: George Meyer, male    DOB: 1941/03/13, 80 y.o.   MRN: [id number]  HPI Patient arrives for a follow up on weight loss. Patient states he still just doesn't have an appetite.  Blood in stool - Plan: Ferritin, Iron Binding Cap (TIBC)([facility]), CBC with Differential/Platelet, Comprehensive metabolic panel, TSH, T4, free  Chronic obstructive pulmonary disease, unspecified COPD type ([place]) - Plan: Ferritin, Iron Binding Cap (TIBC)([facility]), CBC with Differential/Platelet, Comprehensive metabolic panel, TSH, T4, free  Loss of weight - Plan: Ferritin, Iron Binding Cap (TIBC)([facility]), CBC with Differential/Platelet, Comprehensive metabolic panel, TSH, T4, free  Early satiety - Plan: Ferritin, Iron Binding Cap (TIBC)([facility]), CBC with Differential/Platelet, Comprehensive metabolic panel, TSH, T4, free  Iron deficiency - Plan: Ferritin, Iron Binding Cap (TIBC)([facility]), CBC with Differential/Platelet, Comprehensive metabolic panel, TSH, T4, free  He does admit that he is depressed but he states that he is also trying to do the best he can and eating well and trying to stay socially active in terms of talking to people by phone or occasionally getting together at a distance he does state that he has not seen any visible blood in his stool denies any abdominal pain chest pain no hemoptysis no fever sweats or chills he does relate early satiety he has been seen multiple times by gastroenterology he had a CT scan a year and a half ago of the chest and abdomen pelvis which were negative  Review of Systems Early satiety No visible blood loss No pain    Objective:   Physical Exam Vitals reviewed.  Cardiovascular:     Rate and Rhythm: Normal rate and regular rhythm.     Heart sounds: Normal heart sounds. No murmur heard.   Pulmonary:     Effort: Pulmonary effort is normal.     Breath sounds: Normal breath sounds.   Lymphadenopathy:     Cervical: No cervical adenopathy.  Neurological:     Mental Status: He is alert.  Psychiatric:        Behavior: Behavior normal.           Assessment & Plan:  1. Blood in stool Has not heard from GI We will send message pt need evaluation Patient did have a stool test which was positive for blood we will go ahead and check some labs.  Patient may benefit from having a colonoscopy if it is not felt that that is reasonable perhaps a repeat CT scan of the abdomen pelvis we will pose this to gastroenterology - Ferritin - Iron Binding Cap (TIBC)([facility]) - CBC with Differential/Platelet - Comprehensive metabolic panel - TSH - T4, free  2. Chronic obstructive pulmonary disease, unspecified COPD type ([place]) Moderate severe-may be causing his weight loss but we will try to rule out other causes - Ferritin - Iron Binding Cap (TIBC)([facility]) - CBC with Differential/Platelet - Comprehensive metabolic panel - TSH - T4, free  3. Loss of weight Improve calorie intake Do labs Will see results, may have to do CT of abd  GI to weigh in - Ferritin - Iron Binding Cap (TIBC)([facility]) - CBC with Differential/Platelet - Comprehensive metabolic panel - TSH - T4, free  4. Early satiety Has had EGD - Ferritin - Iron Binding Cap (TIBC)([facility]) - CBC with Differential/Platelet - Comprehensive metabolic panel - TSH - T4, free  5. Iron deficiency Labs ordered - Ferritin - Iron Binding Cap (TIBC)([facility]) - CBC with Differential/Platelet - Comprehensive metabolic panel - TSH - T4, free  Recheck in 8 weeks

## 2020-11-07 DIAGNOSIS — K921 Melena: Secondary | ICD-10-CM | POA: Diagnosis not present

## 2020-11-07 DIAGNOSIS — R6881 Early satiety: Secondary | ICD-10-CM | POA: Diagnosis not present

## 2020-11-07 DIAGNOSIS — J449 Chronic obstructive pulmonary disease, unspecified: Secondary | ICD-10-CM | POA: Diagnosis not present

## 2020-11-07 DIAGNOSIS — E611 Iron deficiency: Secondary | ICD-10-CM | POA: Diagnosis not present

## 2020-11-07 DIAGNOSIS — R634 Abnormal weight loss: Secondary | ICD-10-CM | POA: Diagnosis not present

## 2020-11-08 LAB — COMPREHENSIVE METABOLIC PANEL
ALT: 12 IU/L (ref 0–44)
AST: 21 IU/L (ref 0–40)
Albumin/Globulin Ratio: 1.6 (ref 1.2–2.2)
Albumin: 4 g/dL (ref 3.6–4.6)
Alkaline Phosphatase: 69 IU/L (ref 44–121)
BUN/Creatinine Ratio: 11 (ref 10–24)
BUN: 9 mg/dL (ref 8–27)
Bilirubin Total: 0.7 mg/dL (ref 0.0–1.2)
CO2: 28 mmol/L (ref 20–29)
Calcium: 9.2 mg/dL (ref 8.6–10.2)
Chloride: 97 mmol/L (ref 96–106)
Creatinine, Ser: 0.81 mg/dL (ref 0.76–1.27)
Globulin, Total: 2.5 g/dL (ref 1.5–4.5)
Glucose: 66 mg/dL (ref 65–99)
Potassium: 4.3 mmol/L (ref 3.5–5.2)
Sodium: 138 mmol/L (ref 134–144)
Total Protein: 6.5 g/dL (ref 6.0–8.5)
eGFR: 89 mL/min/{1.73_m2} (ref >59)

## 2020-11-08 LAB — CBC WITH DIFFERENTIAL/PLATELET
Basophils Absolute: 0.1 10*3/uL (ref 0.0–0.2)
Basos: 1 %
EOS (ABSOLUTE): 1.3 10*3/uL — ABNORMAL HIGH (ref 0.0–0.4)
Eos: 13 %
Hematocrit: 50.4 % (ref 37.5–51.0)
Hemoglobin: 16.6 g/dL (ref 13.0–17.7)
Immature Grans (Abs): 0 10*3/uL (ref 0.0–0.1)
Immature Granulocytes: 0 %
Lymphocytes Absolute: 2.6 10*3/uL (ref 0.7–3.1)
Lymphs: 27 %
MCH: 30.8 pg (ref 26.6–33.0)
MCHC: 32.9 g/dL (ref 31.5–35.7)
MCV: 94 fL (ref 79–97)
Monocytes Absolute: 0.8 10*3/uL (ref 0.1–0.9)
Monocytes: 8 %
Neutrophils Absolute: 4.8 10*3/uL (ref 1.4–7.0)
Neutrophils: 51 %
Platelets: 357 10*3/uL (ref 150–450)
RBC: 5.39 x10E6/uL (ref 4.14–5.80)
RDW: 13.7 % (ref 11.6–15.4)
WBC: 9.5 10*3/uL (ref 3.4–10.8)

## 2020-11-08 LAB — T4, FREE: Free T4: 1.05 ng/dL (ref 0.82–1.77)

## 2020-11-08 LAB — IRON AND TIBC
Iron Saturation: 64 % — ABNORMAL HIGH (ref 15–55)
Iron: 159 ug/dL (ref 38–169)
Total Iron Binding Capacity: 250 ug/dL (ref 250–450)
UIBC: 91 ug/dL — ABNORMAL LOW (ref 111–343)

## 2020-11-08 LAB — FERRITIN: Ferritin: 243 ng/mL (ref 30–400)

## 2020-11-08 LAB — TSH: TSH: 2.39 u[IU]/mL (ref 0.450–4.500)

## 2020-11-17 ENCOUNTER — Ambulatory Visit: Payer: PPO | Admitting: Family Medicine

## 2020-11-29 ENCOUNTER — Telehealth: Payer: Self-pay

## 2020-11-29 MED ORDER — NYSTATIN 100000 UNIT/ML MT SUSP: 5.0000 mL | Freq: Four times a day (QID) | OROMUCOSAL | 1 refills | Status: DC

## 2020-11-29 NOTE — Telephone Encounter (Signed)
Sheryl has thrush and wants med sent to [place]   Pt call back [phone]

## 2020-11-29 NOTE — Telephone Encounter (Signed)
Prescription sent electronically to pharmacy. Patient notified. 

## 2020-11-29 NOTE — Telephone Encounter (Signed)
Nystatin oral solution 1 teaspoon swish and swallow 4 times daily for 7 days, 180 mL 1 refill

## 2020-11-30 ENCOUNTER — Ambulatory Visit: Payer: PPO | Admitting: Nurse Practitioner

## 2020-11-30 ENCOUNTER — Other Ambulatory Visit: Payer: Self-pay

## 2020-11-30 ENCOUNTER — Encounter: Payer: Self-pay | Admitting: Gastroenterology

## 2020-11-30 ENCOUNTER — Other Ambulatory Visit: Payer: Self-pay | Admitting: Family Medicine

## 2020-11-30 ENCOUNTER — Encounter: Payer: Self-pay | Admitting: *Deleted

## 2020-11-30 ENCOUNTER — Ambulatory Visit: Payer: PPO | Admitting: Gastroenterology

## 2020-11-30 ENCOUNTER — Telehealth: Payer: Self-pay | Admitting: *Deleted

## 2020-11-30 VITALS — BP 139/79 | HR 74 | Temp 97.1°F | Ht 68.0 in | Wt 120.0 lb

## 2020-11-30 DIAGNOSIS — K625 Hemorrhage of anus and rectum: Secondary | ICD-10-CM

## 2020-11-30 DIAGNOSIS — R634 Abnormal weight loss: Secondary | ICD-10-CM | POA: Diagnosis not present

## 2020-11-30 DIAGNOSIS — K5900 Constipation, unspecified: Secondary | ICD-10-CM

## 2020-11-30 DIAGNOSIS — R630 Anorexia: Secondary | ICD-10-CM

## 2020-11-30 DIAGNOSIS — R195 Other fecal abnormalities: Secondary | ICD-10-CM

## 2020-11-30 MED ORDER — LUBIPROSTONE 8 MCG PO CAPS: ORAL_CAPSULE | ORAL | 3 refills | Status: DC

## 2020-11-30 NOTE — Patient Instructions (Addendum)
1. Start Amitiza 70mcg one capsule once or twice daily with food, for constipation.  Prescription sent to pharmacy.  Please call if this is not effective, we can increase the dose.  Please call if medication too expensive. 2. I will reach out to [place] to inquire about possibility of increasing mirtazapine to 15 mg at bedtime. 3. Colonoscopy to be scheduled.  See separate instructions. PLEASE MAKE SURE YOUR BOWELS ARE MOVING WELL THE WEEK PRIOR TO YOUR COLONOSCOPY SO WHEN YOU START YOUR BOWEL PREP WE WILL GET YOU CLEANED OUT ADEQUATELY.

## 2020-11-30 NOTE — Telephone Encounter (Signed)
Pre-op scheduled for 6/9 at 11:00am.  Called pt, left detail message on named VM with appt details.

## 2020-11-30 NOTE — Progress Notes (Addendum)
Primary Care Physician:  Kathyrn Drown, MD  Primary Gastroenterologist:  Garfield Cornea, MD   Chief Complaint  Patient presents with  . Weight Loss    Reports holding steady but he has no appetite  . Constipation    BM's 3 times a week, takes stool softner when he remembers     HPI:  George Meyer is a 80 y.o. male here for follow-up of weight loss, constipation, poor appetite.  Patient last seen in March 2022.  History of weight loss/early satiety started around March 2020 at the onset of the pandemic.  Associated loss of interest in activities.Labs in January 2021with slight chronic elevation of WBC, otherwise CBC, CMP, lipase, PSA, TSH, free T4, C-reactive protein within normal limits. IFOBT negativein September 2020.CT A/P without acute findings in October 2020.Also completed lung cancer screening CT with benign findings, recommending repeat in 12 months.  EGD 09/17/2019 with esophageal web/noncritical Schatzki's ring notmanipulated, small hiatal hernia, inflamed appearing stomach positive for H. pylori, normal examined duodenum. He was treated with Prevpac x14 days. H. pylori breath test negative on 11/03/2019. Last colonoscopy in 2011 with hyperplastic polypwith recommendations to repeat in 10 years. Primary care referred patient to endocrinology, appointment was scheduled for August 2021, but canceled this.  Gastric emptying study completed July 2021 and within normal limits.  Previously advised for repeating colonoscopy August 2021, but patient called back later and canceled because he was feeling better at that time.  Patient was started on Remeron to improve appetite and for depression symptoms.  In March 2022 he had called in with further weight loss of 17 pounds, had noted that his Remeron dose had been decreased by half and he did have a decline in appetite along with that.  Walden Field, NP saw patient in March and decided to increase his Remeron back to 15 mg, advising  patient to follow-up with PCP regarding this change.  Also advised need for colonoscopy to evaluate weight loss, patient called back the following week reporting that he did not want to have a colonoscopy done.  Labs in May 2022: Ferritin 243, iron 159, iron saturation 64%, TIBC 250, white blood cell count 9500, hemoglobin 16.6, platelet 357,000, BUN 9, creatinine 0.81, LFTs normal including albumin of 4.  Free T4 and TSH normal.  Stool was heme positive.  March 2020: Weight 143.8 pounds February 2021: Weight 136.8 pounds April 2021: Weight 128 pounds November 2021: Weight 229.2 pounds March 2022: Weight 220 pounds Today: Weight 121 pounds  Today: Patient reports that he remains on Remeron 7.5 mg daily.  Feels his appetite was somewhat improved when he was on 15 mg daily.  He wonders about going back up on the dose, it appears that he never increased the dose after his last visit. Does strain to have a bowel movement frequently, sometimes will see fresh blood.  Believes that heme-positive stool came from straining.  Early satiety. "stomach is shrunk up at this point" and can't hold a lot. Two glasses of fluid daily. Feels like urinates well, clear urine. No abdominal pain. BM 2-3 times per week. Has never been regular. No melena. No vomiting. No heartburn. No dysphagia.   Patient states he quit smoking 3 weeks ago.  Current Outpatient Medications  Medication Sig Dispense Refill  . albuterol (PROVENTIL) (2.5 MG/3ML) 0.083% nebulizer solution USE 1 VIAL IN NEBULIZER EVERY 4 HOURS AS NEEDED FOR WHEEZING 300 mL 3  . albuterol (VENTOLIN HFA) 108 (90 Base) MCG/ACT inhaler Inhale 2  puffs into the lungs every 4 (four) hours as needed. 54 g 0  . ALPRAZolam (XANAX) 0.25 MG tablet 1 Q HS prn 30 tablet 5  . aspirin EC 81 MG tablet Take 81 mg by mouth daily.    . budesonide-formoterol (SYMBICORT) 160-4.5 MCG/ACT inhaler INHALE TWO PUFFS BY MOUTH TWICE DAILY ** STOP SPIRIVA ** 1 each 6  . docusate sodium  (COLACE) 100 MG capsule Take 100 mg by mouth 2 (two) times daily as needed for mild constipation.    . dorzolamide-timolol (COSOPT) 22.3-6.8 MG/ML ophthalmic solution Place 1 drop into both eyes 2 (two) times daily. 10 mL 0  . lisinopril (ZESTRIL) 2.5 MG tablet Take 1 tablet (2.5 mg total) by mouth daily. 90 tablet 1  .       . nitroGLYCERIN (NITROSTAT) 0.4 MG SL tablet 1 sl q 5 min prn chest pressure as directed 50 tablet 3  . nystatin (MYCOSTATIN) 100000 UNIT/ML suspension Take 5 mLs (500,000 Units total) by mouth 4 (four) times daily. Swish and swallow 180 mL 1  . ondansetron (ZOFRAN ODT) 8 MG disintegrating tablet Take 1 tablet 3 x daily has needed 15 tablet 2  . pantoprazole (PROTONIX) 40 MG tablet Take 1 tablet (40 mg total) by mouth daily before breakfast. 30 tablet 2  . pravastatin (PRAVACHOL) 40 MG tablet Take 1 tablet (40 mg total) by mouth at bedtime. 90 tablet 1  . predniSONE (DELTASONE) 20 MG tablet Take 20-80 mg by mouth daily as needed (for respiratory issues).    . Saw Palmetto 450 MG CAPS Take 450 mg by mouth in the morning and at bedtime.    . mirtazapine (REMERON) 7.5 MG tablet Take 7.5 mg by mouth at bedtime.     No current facility-administered medications for this visit.    Allergies as of 11/30/2020 - Review Complete 11/30/2020  Allergen Reaction Noted  . Amlodipine Itching 12/21/2013  . Chlorzoxazone Itching 03/18/2019    Past Medical History:  Diagnosis Date  . Anxiety   . Cataracts, bilateral    removed by surgery  . COPD (chronic obstructive pulmonary disease) ([place])   . Depression, major, single episode, moderate ([place]) 12/18/2018  . Glaucoma    both eyes  . Glaucoma    bilateral  . H. pylori infection 09/17/2019   S/p treatment with Prevpac. Documented eradiation via breath test on 11/03/19.   Marland Kitchen Hyperlipidemia   . Hypertension   . Pre-diabetes    diet controlled  . Skin cancer, basal cell    arms, face  . Wears dentures    full    Past Surgical  History:  Procedure Laterality Date  . BIOPSY  09/17/2019   Procedure: BIOPSY;  Surgeon: Daneil Dolin, MD;  Location: [facility];  Service: Endoscopy;;  gastric  . CARDIAC CATHETERIZATION N/A 12/2005   negative  . COLONOSCOPY N/A 12/2009   small hyperplastic polyp repeat in 10 years  . DUPUYTREN CONTRACTURE RELEASE Left 02/02/2019   Procedure: EXCISION LEFT HAND AND SMALL FINGER DUPUYTREN CONTRACTURE RELEASE;  Surgeon: Marybelle Killings, MD;  Location: [facility];  Service: Orthopedics;  Laterality: Left;  . ESOPHAGOGASTRODUODENOSCOPY (EGD) WITH PROPOFOL N/A 09/17/2019   Procedure: ESOPHAGOGASTRODUODENOSCOPY (EGD) WITH PROPOFOL;  Surgeon: Daneil Dolin, MD; esophageal labs/Schatzki's ring noncritical, not manipulated.  Small hiatal hernia.  Inflamed stomach with biopsies revealing chronic active gastritis with H. pylori.  H. pylori treated with Prevpac.  Marland Kitchen EYE SURGERY Bilateral    remove cataracts  . HAND SURGERY  Right 2010   dupuytrens excision right little finger  . TONSILLECTOMY    . WISDOM TOOTH EXTRACTION      Family History  Problem Relation Age of Onset  . Heart attack Father   . Heart attack Mother   . Lung cancer Brother   . Colon cancer Neg Hx     Social History   Socioeconomic History  . Marital status: Single    Spouse name: Not on file  . Number of children: Not on file  . Years of education: Not on file  . Highest education level: Not on file  Occupational History  . Not on file  Tobacco Use  . Smoking status: Former Smoker    Packs/day: 1.00    Years: 66.00    Pack years: 66.00    Types: Cigarettes    Quit date: 11/09/2020    Years since quitting: 0.0  . Smokeless tobacco: Never Used  . Tobacco comment: started smoking age 63  Vaping Use  . Vaping Use: Never used  Substance and Sexual Activity  . Alcohol use: No  . Drug use: No  . Sexual activity: Yes    Partners: Female  Other Topics Concern  . Not on file  Social History Narrative  . Not on file    Social Determinants of Health   Financial Resource Strain: Not on file  Food Insecurity: Not on file  Transportation Needs: Not on file  Physical Activity: Not on file  Stress: Not on file  Social Connections: Not on file  Intimate Partner Violence: Not on file      ROS:  General: Negative for fever, chills, fatigue, weakness.  See HPI Eyes: Negative for vision changes.  ENT: Negative for hoarseness, difficulty swallowing , nasal congestion.  States he is currently on medication for thrush. CV: Negative for chest pain, angina, palpitations, positive dyspnea on exertion at times, peripheral edema.  Respiratory: Negative for dyspnea at rest, positive dyspnea on exertion at time, cough, sputum, wheezing.  GI: See history of present illness. GU:  Negative for dysuria, hematuria, urinary incontinence, urinary frequency, nocturnal urination.  MS: Negative for joint pain, low back pain.  Derm: Negative for rash or itching.  Neuro: Negative for weakness, abnormal sensation, seizure, frequent headaches, memory loss, confusion.  Psych: Negative for  suicidal ideation, hallucinations.  Positive for anxiety, depression Endo: See HPI Heme: Negative for bruising or bleeding. Allergy: Negative for rash or hives.    Physical Examination:  BP 139/79   Pulse 74   Temp (!) 97.1 F (36.2 C)   Ht 5\' 8"  (1.727 m)   Wt 120 lb (54.4 kg)   BMI 18.25 kg/m    General: Thin, well-developed in no acute distress.  Head: Normocephalic, atraumatic.   Eyes: Conjunctiva pink, no icterus. Mouth: masked Neck: Supple without thyromegaly, masses, or lymphadenopathy.  Lungs: Scattered rhonchi Heart: Regular rate and rhythm, no murmurs rubs or gallops.  Abdomen: Bowel sounds are normal, nontender, nondistended, no hepatosplenomegaly or masses, no abdominal bruits or    hernia , no rebound or guarding.   Rectal: not performed Extremities: No lower extremity edema. No clubbing or deformities.  Neuro:  Alert and oriented x 4 , grossly normal neurologically.  Skin: Warm and dry, no rash or jaundice.   Psych: Alert and cooperative, normal mood and affect.  Labs: Lab Results  Component Value Date   CREATININE 0.81 11/07/2020   BUN 9 11/07/2020   NA 138 11/07/2020   K 4.3 11/07/2020  CL 97 11/07/2020   CO2 28 11/07/2020   Lab Results  Component Value Date   ALT 12 11/07/2020   AST 21 11/07/2020   ALKPHOS 69 11/07/2020   BILITOT 0.7 11/07/2020   Lab Results  Component Value Date   WBC 9.5 11/07/2020   HGB 16.6 11/07/2020   HCT 50.4 11/07/2020   MCV 94 11/07/2020   PLT 357 11/07/2020   Lab Results  Component Value Date   TSH 2.390 11/07/2020     Imaging Studies: No results found.   Assessment: Pleasant 80 year old male here for follow-up of constipation, weight loss, poor appetite, early satiety and the setting of social isolation related to COVID-19 pandemic.  Patient feels overall that his symptoms were brought on by social isolation.  Used to be involved heavily in the church in the community.  Admits that he does not eat well, he is doing well at home.  Since March 2020, he has lost over 25 pounds.  Over the past 3 months, his weight has remained stable.  He believes that his appetite was somewhat improved on mirtazapine 15 mg in the evenings, noting that when his dose was decreased by half, his appetite declined.  He is interested in going back on 15 mg daily if okay with PCP.  Work-up to date has included CT of the abdomen and pelvis, screening chest CT, gastric emptying study, EGD (esophageal web/noncritical Schatzki ring not manipulated, inflamed appearing stomach positive for H. pylori, confirmed eradication with Prevpac).  Recent labs looked great.  He was heme positive.  This could be due to benign anorectal source given chronic constipation and straining.  He does have intermittent rectal bleeding associated with straining.  His last colonoscopy was in 2011,  hyperplastic polyp removed at the time.  Previously recommended to have an updated colonoscopy on 2 occasions in the past year.  Patient declined at the time because he was feeling better.  With recent heme positive stool, ongoing appetite concerns, history of weight loss, constipation associated with rectal bleeding, would again advise colonoscopy.  Patient agreeable today.  Constipation will be addressed today.  Previously MiraLAX worked initially but then stopped working.  He has been encouraged to increase fluid intake previously, he is trying to do this as best as he can but given his early satiety he feels limited.  We will add Amitiza.  Discussed need to adequately address his bowels especially prior to colonoscopy to ensure adequate bowel prep.  Plan: 1. Colonoscopy in the near future with propofol, ASA III (COPD).  Would expedite work-up.  Patient agreeable to procedure being performed by either Dr. Gala Romney or Dr. Abbey Chatters, first available.  I have discussed the risks, alternatives, benefits with regards to but not limited to the risk of reaction to medication, bleeding, infection, perforation and the patient is agreeable to proceed. Written consent to be obtained. 2. Start Amitiza 8 mcg once or twice daily with food.  Hold if significant stool frequency/diarrhea. 3. Continue to increase fluid intake, goal of 60 ounces per day. 4. Increase dietary fiber. 5. I will reach out to Dr. Wolfgang Phoenix regarding mirtazapine dose given patient request. 6. Colonoscopy unremarkable, and if patient continues to lose weight, he would likely need repeat imaging. 7. Continue with screening chest CTs per PCP.  Should be due this month.

## 2020-11-30 NOTE — H&P (View-Only) (Signed)
Primary Care Physician:  Kathyrn Drown, MD  Primary Gastroenterologist:  Garfield Cornea, MD   Chief Complaint  Patient presents with  . Weight Loss    Reports holding steady but he has no appetite  . Constipation    BM's 3 times a week, takes stool softner when he remembers     HPI:  George Meyer is a 80 y.o. male here for follow-up of weight loss, constipation, poor appetite.  Patient last seen in March 2022.  History of weight loss/early satiety started around March 2020 at the onset of the pandemic.  Associated loss of interest in activities.Labs in January 2021with slight chronic elevation of WBC, otherwise CBC, CMP, lipase, PSA, TSH, free T4, C-reactive protein within normal limits. IFOBT negativein September 2020.CT A/P without acute findings in October 2020.Also completed lung cancer screening CT with benign findings, recommending repeat in 12 months.  EGD 09/17/2019 with esophageal web/noncritical Schatzki's ring notmanipulated, small hiatal hernia, inflamed appearing stomach positive for H. pylori, normal examined duodenum. He was treated with Prevpac x14 days. H. pylori breath test negative on 11/03/2019. Last colonoscopy in 2011 with hyperplastic polypwith recommendations to repeat in 10 years. Primary care referred patient to endocrinology, appointment was scheduled for August 2021, but canceled this.  Gastric emptying study completed July 2021 and within normal limits.  Previously advised for repeating colonoscopy August 2021, but patient called back later and canceled because he was feeling better at that time.  Patient was started on Remeron to improve appetite and for depression symptoms.  In March 2022 he had called in with further weight loss of 17 pounds, had noted that his Remeron dose had been decreased by half and he did have a decline in appetite along with that.  Walden Field, NP saw patient in March and decided to increase his Remeron back to 15 mg, advising  patient to follow-up with PCP regarding this change.  Also advised need for colonoscopy to evaluate weight loss, patient called back the following week reporting that he did not want to have a colonoscopy done.  Labs in May 2022: Ferritin 243, iron 159, iron saturation 64%, TIBC 250, white blood cell count 9500, hemoglobin 16.6, platelet 357,000, BUN 9, creatinine 0.81, LFTs normal including albumin of 4.  Free T4 and TSH normal.  Stool was heme positive.  March 2020: Weight 143.8 pounds February 2021: Weight 136.8 pounds April 2021: Weight 128 pounds November 2021: Weight 229.2 pounds March 2022: Weight 220 pounds Today: Weight 121 pounds  Today: Patient reports that he remains on Remeron 7.5 mg daily.  Feels his appetite was somewhat improved when he was on 15 mg daily.  He wonders about going back up on the dose, it appears that he never increased the dose after his last visit. Does strain to have a bowel movement frequently, sometimes will see fresh blood.  Believes that heme-positive stool came from straining.  Early satiety. "stomach is shrunk up at this point" and can't hold a lot. Two glasses of fluid daily. Feels like urinates well, clear urine. No abdominal pain. BM 2-3 times per week. Has never been regular. No melena. No vomiting. No heartburn. No dysphagia.   Patient states he quit smoking 3 weeks ago.  Current Outpatient Medications  Medication Sig Dispense Refill  . albuterol (PROVENTIL) (2.5 MG/3ML) 0.083% nebulizer solution USE 1 VIAL IN NEBULIZER EVERY 4 HOURS AS NEEDED FOR WHEEZING 300 mL 3  . albuterol (VENTOLIN HFA) 108 (90 Base) MCG/ACT inhaler Inhale 2  puffs into the lungs every 4 (four) hours as needed. 54 g 0  . ALPRAZolam (XANAX) 0.25 MG tablet 1 Q HS prn 30 tablet 5  . aspirin EC 81 MG tablet Take 81 mg by mouth daily.    . budesonide-formoterol (SYMBICORT) 160-4.5 MCG/ACT inhaler INHALE TWO PUFFS BY MOUTH TWICE DAILY ** STOP SPIRIVA ** 1 each 6  . docusate sodium  (COLACE) 100 MG capsule Take 100 mg by mouth 2 (two) times daily as needed for mild constipation.    . dorzolamide-timolol (COSOPT) 22.3-6.8 MG/ML ophthalmic solution Place 1 drop into both eyes 2 (two) times daily. 10 mL 0  . lisinopril (ZESTRIL) 2.5 MG tablet Take 1 tablet (2.5 mg total) by mouth daily. 90 tablet 1  .       . nitroGLYCERIN (NITROSTAT) 0.4 MG SL tablet 1 sl q 5 min prn chest pressure as directed 50 tablet 3  . nystatin (MYCOSTATIN) 100000 UNIT/ML suspension Take 5 mLs (500,000 Units total) by mouth 4 (four) times daily. Swish and swallow 180 mL 1  . ondansetron (ZOFRAN ODT) 8 MG disintegrating tablet Take 1 tablet 3 x daily has needed 15 tablet 2  . pantoprazole (PROTONIX) 40 MG tablet Take 1 tablet (40 mg total) by mouth daily before breakfast. 30 tablet 2  . pravastatin (PRAVACHOL) 40 MG tablet Take 1 tablet (40 mg total) by mouth at bedtime. 90 tablet 1  . predniSONE (DELTASONE) 20 MG tablet Take 20-80 mg by mouth daily as needed (for respiratory issues).    . Saw Palmetto 450 MG CAPS Take 450 mg by mouth in the morning and at bedtime.    . mirtazapine (REMERON) 7.5 MG tablet Take 7.5 mg by mouth at bedtime.     No current facility-administered medications for this visit.    Allergies as of 11/30/2020 - Review Complete 11/30/2020  Allergen Reaction Noted  . Amlodipine Itching 12/21/2013  . Chlorzoxazone Itching 03/18/2019    Past Medical History:  Diagnosis Date  . Anxiety   . Cataracts, bilateral    removed by surgery  . COPD (chronic obstructive pulmonary disease) ([place])   . Depression, major, single episode, moderate ([place]) 12/18/2018  . Glaucoma    both eyes  . Glaucoma    bilateral  . H. pylori infection 09/17/2019   S/p treatment with Prevpac. Documented eradiation via breath test on 11/03/19.   Marland Kitchen Hyperlipidemia   . Hypertension   . Pre-diabetes    diet controlled  . Skin cancer, basal cell    arms, face  . Wears dentures    full    Past Surgical  History:  Procedure Laterality Date  . BIOPSY  09/17/2019   Procedure: BIOPSY;  Surgeon: Daneil Dolin, MD;  [facility];  Service: Endoscopy;;  gastric  . CARDIAC CATHETERIZATION N/A 12/2005   negative  . COLONOSCOPY N/A 12/2009   small hyperplastic polyp repeat in 10 years  . DUPUYTREN CONTRACTURE RELEASE Left 02/02/2019   Procedure: EXCISION LEFT HAND AND SMALL FINGER DUPUYTREN CONTRACTURE RELEASE;  Surgeon: Marybelle Killings, MD;  Location: [facility];  Service: Orthopedics;  Laterality: Left;  . ESOPHAGOGASTRODUODENOSCOPY (EGD) WITH PROPOFOL N/A 09/17/2019   Procedure: ESOPHAGOGASTRODUODENOSCOPY (EGD) WITH PROPOFOL;  Surgeon: Daneil Dolin, MD; esophageal labs/Schatzki's ring noncritical, not manipulated.  Small hiatal hernia.  Inflamed stomach with biopsies revealing chronic active gastritis with H. pylori.  H. pylori treated with Prevpac.  Marland Kitchen EYE SURGERY Bilateral    remove cataracts  . HAND SURGERY  Right 2010   dupuytrens excision right little finger  . TONSILLECTOMY    . WISDOM TOOTH EXTRACTION      Family History  Problem Relation Age of Onset  . Heart attack Father   . Heart attack Mother   . Lung cancer Brother   . Colon cancer Neg Hx     Social History   Socioeconomic History  . Marital status: Single    Spouse name: Not on file  . Number of children: Not on file  . Years of education: Not on file  . Highest education level: Not on file  Occupational History  . Not on file  Tobacco Use  . Smoking status: Former Smoker    Packs/day: 1.00    Years: 66.00    Pack years: 66.00    Types: Cigarettes    Quit date: 11/09/2020    Years since quitting: 0.0  . Smokeless tobacco: Never Used  . Tobacco comment: started smoking age 89  Vaping Use  . Vaping Use: Never used  Substance and Sexual Activity  . Alcohol use: No  . Drug use: No  . Sexual activity: Yes    Partners: Female  Other Topics Concern  . Not on file  Social History Narrative  . Not on file    Social Determinants of Health   Financial Resource Strain: Not on file  Food Insecurity: Not on file  Transportation Needs: Not on file  Physical Activity: Not on file  Stress: Not on file  Social Connections: Not on file  Intimate Partner Violence: Not on file      ROS:  General: Negative for fever, chills, fatigue, weakness.  See HPI Eyes: Negative for vision changes.  ENT: Negative for hoarseness, difficulty swallowing , nasal congestion.  States he is currently on medication for thrush. CV: Negative for chest pain, angina, palpitations, positive dyspnea on exertion at times, peripheral edema.  Respiratory: Negative for dyspnea at rest, positive dyspnea on exertion at time, cough, sputum, wheezing.  GI: See history of present illness. GU:  Negative for dysuria, hematuria, urinary incontinence, urinary frequency, nocturnal urination.  MS: Negative for joint pain, low back pain.  Derm: Negative for rash or itching.  Neuro: Negative for weakness, abnormal sensation, seizure, frequent headaches, memory loss, confusion.  Psych: Negative for  suicidal ideation, hallucinations.  Positive for anxiety, depression Endo: See HPI Heme: Negative for bruising or bleeding. Allergy: Negative for rash or hives.    Physical Examination:  BP 139/79   Pulse 74   Temp (!) 97.1 F (36.2 C)   Ht 5\' 8"  (1.727 m)   Wt 120 lb (54.4 kg)   BMI 18.25 kg/m    General: Thin, well-developed in no acute distress.  Head: Normocephalic, atraumatic.   Eyes: Conjunctiva pink, no icterus. Mouth: masked Neck: Supple without thyromegaly, masses, or lymphadenopathy.  Lungs: Scattered rhonchi Heart: Regular rate and rhythm, no murmurs rubs or gallops.  Abdomen: Bowel sounds are normal, nontender, nondistended, no hepatosplenomegaly or masses, no abdominal bruits or    hernia , no rebound or guarding.   Rectal: not performed Extremities: No lower extremity edema. No clubbing or deformities.  Neuro:  Alert and oriented x 4 , grossly normal neurologically.  Skin: Warm and dry, no rash or jaundice.   Psych: Alert and cooperative, normal mood and affect.  Labs: Lab Results  Component Value Date   CREATININE 0.81 11/07/2020   BUN 9 11/07/2020   NA 138 11/07/2020   K 4.3 11/07/2020  CL 97 11/07/2020   CO2 28 11/07/2020   Lab Results  Component Value Date   ALT 12 11/07/2020   AST 21 11/07/2020   ALKPHOS 69 11/07/2020   BILITOT 0.7 11/07/2020   Lab Results  Component Value Date   WBC 9.5 11/07/2020   HGB 16.6 11/07/2020   HCT 50.4 11/07/2020   MCV 94 11/07/2020   PLT 357 11/07/2020   Lab Results  Component Value Date   TSH 2.390 11/07/2020     Imaging Studies: No results found.   Assessment: Pleasant 80 year old male here for follow-up of constipation, weight loss, poor appetite, early satiety and the setting of social isolation related to COVID-19 pandemic.  Patient feels overall that his symptoms were brought on by social isolation.  Used to be involved heavily in the church in the community.  Admits that he does not eat well, he is doing well at home.  Since March 2020, he has lost over 25 pounds.  Over the past 3 months, his weight has remained stable.  He believes that his appetite was somewhat improved on mirtazapine 15 mg in the evenings, noting that when his dose was decreased by half, his appetite declined.  He is interested in going back on 15 mg daily if okay with PCP.  Work-up to date has included CT of the abdomen and pelvis, screening chest CT, gastric emptying study, EGD (esophageal web/noncritical Schatzki ring not manipulated, inflamed appearing stomach positive for H. pylori, confirmed eradication with Prevpac).  Recent labs looked great.  He was heme positive.  This could be due to benign anorectal source given chronic constipation and straining.  He does have intermittent rectal bleeding associated with straining.  His last colonoscopy was in 2011,  hyperplastic polyp removed at the time.  Previously recommended to have an updated colonoscopy on 2 occasions in the past year.  Patient declined at the time because he was feeling better.  With recent heme positive stool, ongoing appetite concerns, history of weight loss, constipation associated with rectal bleeding, would again advise colonoscopy.  Patient agreeable today.  Constipation will be addressed today.  Previously MiraLAX worked initially but then stopped working.  He has been encouraged to increase fluid intake previously, he is trying to do this as best as he can but given his early satiety he feels limited.  We will add Amitiza.  Discussed need to adequately address his bowels especially prior to colonoscopy to ensure adequate bowel prep.  Plan: 1. Colonoscopy in the near future with propofol, ASA III (COPD).  Would expedite work-up.  Patient agreeable to procedure being performed by either Dr. Gala Romney or Dr. Abbey Chatters, first available.  I have discussed the risks, alternatives, benefits with regards to but not limited to the risk of reaction to medication, bleeding, infection, perforation and the patient is agreeable to proceed. Written consent to be obtained. 2. Start Amitiza 8 mcg once or twice daily with food.  Hold if significant stool frequency/diarrhea. 3. Continue to increase fluid intake, goal of 60 ounces per day. 4. Increase dietary fiber. 5. I will reach out to Dr. Wolfgang Phoenix regarding mirtazapine dose given patient request. 6. Colonoscopy unremarkable, and if patient continues to lose weight, he would likely need repeat imaging. 7. Continue with screening chest CTs per PCP.  Should be due this month.

## 2020-11-30 NOTE — Progress Notes (Signed)
CC'ED TO PCP 

## 2020-12-02 ENCOUNTER — Telehealth: Payer: Self-pay | Admitting: Family Medicine

## 2020-12-02 ENCOUNTER — Other Ambulatory Visit: Payer: Self-pay

## 2020-12-02 DIAGNOSIS — F324 Major depressive disorder, single episode, in partial remission: Secondary | ICD-10-CM

## 2020-12-02 MED ORDER — MIRTAZAPINE 15 MG PO TABS: 15.0000 mg | ORAL_TABLET | Freq: Every day | ORAL | 5 refills | Status: DC

## 2020-12-02 NOTE — Telephone Encounter (Addendum)
Pt called back and said that he had received a call from our office but he isnt a pt of [facility], I dont know if you need to give him a call back to schedule. He said he is seen at [facility](Its only all uppercase because I copied it)

## 2020-12-02 NOTE — Telephone Encounter (Signed)
Confirmed remeron dose increase with patient , sent to pharmacy.

## 2020-12-02 NOTE — Telephone Encounter (Signed)
Left message for patient to call back and schedule Medicare Annual Wellness Visit (AWV).   Please offer to do virtually or by telephone.   Due for AWVI  Please schedule at anytime with Nurse Health Advisor.   

## 2020-12-02 NOTE — Telephone Encounter (Signed)
Nurses Patient recently saw GI Patient requested to go up on Remeron to the GI doctor. GI doctor connected with [place]  Please let George Meyer know that the GI doctor connected with [place] relating his desire to go up on the Remeron in the evening.  Please confirm this.  If that is the case go ahead with 15 mg 1 nightly #30 with 5 refills patient should keep follow-up visit in July.  Sooner if any problems

## 2020-12-02 NOTE — Telephone Encounter (Signed)
I spoke to patient and explained to him my error.  He was very understanding.  He said he just had a visit from [place] and didn't want to schedule AWVI right now.

## 2020-12-06 NOTE — Patient Instructions (Signed)
LACHARLES ALTSCHULER  12/06/2020     @PREFPERIOPPHARMACY @   Your procedure is scheduled on  12/13/2020.   Report to [facility] at  1230  P.M.   Call this number if you have problems the morning of surgery:  [phone]   Remember:  Follow the diet and prep instructions given to you by the office.                       Take these medicines the morning of surgery with A SIP OF WATER   Protonix, zofran (if needed), prednisone.  Use your nebulizer and your inhalers before you come and bring your rescue inhaler with you.     Please brush your teeth.  Do not wear jewelry, make-up or nail polish.  Do not wear lotions, powders, or perfumes, or deodorant.  Do not shave 48 hours prior to surgery.  Men may shave face and neck.  Do not bring valuables to the hospital.  [facility] is not responsible for any belongings or valuables.  Contacts, dentures or bridgework may not be worn into surgery.  Leave your suitcase in the car.  After surgery it may be brought to your room.  For patients admitted to the hospital, discharge time will be determined by your treatment team.  Patients discharged the day of surgery will not be allowed to drive home and must have someone with them for 24 hours  .    Special instructions:  DO NOT smoke tobacco or vape for 24 hours before your procedure.  Please read over the following fact sheets that you were given. Anesthesia Post-op Instructions and Care and Recovery After Surgery       Colonoscopy, Adult, Care After This sheet gives you information about how to care for yourself after your procedure. Your health care provider may also give you more specific instructions. If you have problems or questions, contact your health care provider. What can I expect after the procedure? After the procedure, it is common to have:  A small amount of blood in your stool for 24 hours after the procedure.  Some gas.  Mild cramping or bloating of your  abdomen. Follow these instructions at home: Eating and drinking  Drink enough fluid to keep your urine pale yellow.  Follow instructions from your health care provider about eating or drinking restrictions.  Resume your normal diet as instructed by your health care provider. Avoid heavy or fried foods that are hard to digest.   Activity  Rest as told by your health care provider.  Avoid sitting for a long time without moving. Get up to take short walks every 1-2 hours. This is important to improve blood flow and breathing. Ask for help if you feel weak or unsteady.  Return to your normal activities as told by your health care provider. Ask your health care provider what activities are safe for you. Managing cramping and bloating  Try walking around when you have cramps or feel bloated.  Apply heat to your abdomen as told by your health care provider. Use the heat source that your health care provider recommends, such as a moist heat pack or a heating pad. ? Place a towel between your skin and the heat source. ? Leave the heat on for 20-30 minutes. ? Remove the heat if your skin turns bright red. This is especially important if you are unable to feel pain, heat, or cold. You  may have a greater risk of getting burned.   General instructions  If you were given a sedative during the procedure, it can affect you for several hours. Do not drive or operate machinery until your health care provider says that it is safe.  For the first 24 hours after the procedure: ? Do not sign important documents. ? Do not drink alcohol. ? Do your regular daily activities at a slower pace than normal. ? Eat soft foods that are easy to digest.  Take over-the-counter and prescription medicines only as told by your health care provider.  Keep all follow-up visits as told by your health care provider. This is important. Contact a health care provider if:  You have blood in your stool 2-3 days after the  procedure. Get help right away if you have:  More than a small spotting of blood in your stool.  Large blood clots in your stool.  Swelling of your abdomen.  Nausea or vomiting.  A fever.  Increasing pain in your abdomen that is not relieved with medicine. Summary  After the procedure, it is common to have a small amount of blood in your stool. You may also have mild cramping and bloating of your abdomen.  If you were given a sedative during the procedure, it can affect you for several hours. Do not drive or operate machinery until your health care provider says that it is safe.  Get help right away if you have a lot of blood in your stool, nausea or vomiting, a fever, or increased pain in your abdomen. This information is not intended to replace advice given to you by your health care provider. Make sure you discuss any questions you have with your health care provider. Document Revised: 06/12/2019 Document Reviewed: 01/12/2019 Elsevier Patient Education  2021 [place] After This sheet gives you information about how to care for yourself after your procedure. Your health care provider may also give you more specific instructions. If you have problems or questions, contact your health care provider. What can I expect after the procedure? After the procedure, it is common to have:  Tiredness.  Forgetfulness about what happened after the procedure.  Impaired judgment for important decisions.  Nausea or vomiting.  Some difficulty with balance. Follow these instructions at home: For the time period you were told by your health care provider:  Rest as needed.  Do not participate in activities where you could fall or become injured.  Do not drive or use machinery.  Do not drink alcohol.  Do not take sleeping pills or medicines that cause drowsiness.  Do not make important decisions or sign legal documents.  Do not take care of  children on your own.      Eating and drinking  Follow the diet that is recommended by your health care provider.  Drink enough fluid to keep your urine pale yellow.  If you vomit: ? Drink water, juice, or soup when you can drink without vomiting. ? Make sure you have little or no nausea before eating solid foods. General instructions  Have a responsible adult stay with you for the time you are told. It is important to have someone help care for you until you are awake and alert.  Take over-the-counter and prescription medicines only as told by your health care provider.  If you have sleep apnea, surgery and certain medicines can increase your risk for breathing problems. Follow instructions from your health care  provider about wearing your sleep device: ? Anytime you are sleeping, including during daytime naps. ? While taking prescription pain medicines, sleeping medicines, or medicines that make you drowsy.  Avoid smoking.  Keep all follow-up visits as told by your health care provider. This is important. Contact a health care provider if:  You keep feeling nauseous or you keep vomiting.  You feel light-headed.  You are still sleepy or having trouble with balance after 24 hours.  You develop a rash.  You have a fever.  You have redness or swelling around the IV site. Get help right away if:  You have trouble breathing.  You have new-onset confusion at home. Summary  For several hours after your procedure, you may feel tired. You may also be forgetful and have poor judgment.  Have a responsible adult stay with you for the time you are told. It is important to have someone help care for you until you are awake and alert.  Rest as told. Do not drive or operate machinery. Do not drink alcohol or take sleeping pills.  Get help right away if you have trouble breathing, or if you suddenly become confused. This information is not intended to replace advice given to you  by your health care provider. Make sure you discuss any questions you have with your health care provider. Document Revised: 03/03/2020 Document Reviewed: 05/21/2019 Elsevier Patient Education  2021 [employer].

## 2020-12-08 ENCOUNTER — Encounter (HOSPITAL_COMMUNITY): Payer: Self-pay

## 2020-12-08 ENCOUNTER — Other Ambulatory Visit: Payer: Self-pay

## 2020-12-08 ENCOUNTER — Encounter (HOSPITAL_COMMUNITY)
Admission: RE | Admit: 2020-12-08 | Discharge: 2020-12-08 | Disposition: A | Discharge status: Home / Self Care | Payer: PPO | Source: Ambulatory Visit | Attending: Internal Medicine | Admitting: Internal Medicine

## 2020-12-08 DIAGNOSIS — I4510 Unspecified right bundle-branch block: Secondary | ICD-10-CM | POA: Diagnosis not present

## 2020-12-08 DIAGNOSIS — Z01818 Encounter for other preprocedural examination: Secondary | ICD-10-CM | POA: Diagnosis not present

## 2020-12-09 ENCOUNTER — Encounter: Payer: Self-pay | Admitting: Nurse Practitioner

## 2020-12-09 ENCOUNTER — Ambulatory Visit (INDEPENDENT_AMBULATORY_CARE_PROVIDER_SITE_OTHER): Payer: PPO | Admitting: Nurse Practitioner

## 2020-12-09 VITALS — BP 150/70 | HR 69 | Temp 97.7°F | Ht 68.0 in | Wt 122.0 lb

## 2020-12-09 DIAGNOSIS — B370 Candidal stomatitis: Secondary | ICD-10-CM

## 2020-12-09 MED ORDER — FLUCONAZOLE 100 MG PO TABS: ORAL_TABLET | ORAL | 0 refills | Status: DC

## 2020-12-09 NOTE — Progress Notes (Signed)
   Subjective:    Patient ID: George Meyer, male    DOB: [birth year], 80 y.o.   MRN: [id number]  HPI White spots on tongue and gums x 6 weeks tried nystatin mouth wash- not helping  Was given nystatin oral suspension but areas did not resolve.  Takes daily Symbicort for his COPD.  Areas are mainly in the mouth, no sore throat or difficulty swallowing.  See previous note from GI specialist.  Appetite has improved since increasing his Remeron dose to 15 mg.  Tries to stay active.  Has a good support system.  Does his own meal preparations. Quit smoking about 4 weeks ago.  Is scheduled for his colonoscopy next week.         Objective:   Physical Exam NAD.  Alert, oriented.  Lungs clear.  Heart regular rate rhythm.  Confluent white area noted on the tongue and lower gums. Today's Vitals   12/09/20 1008  BP: (!) 150/70  Pulse: 69  Temp: 97.7 F (36.5 C)  SpO2: 97%  Weight: 122 lb (55.3 kg)  Height: 5\' 8"  (1.727 m)   Body mass index is 18.55 kg/m.  Has gained back 8 pounds from his previous weight loss.      Assessment & Plan:   Problem List Items Addressed This Visit   None Visit Diagnoses     Oral candidiasis    -  Primary   Relevant Medications   fluconazole (DIFLUCAN) 100 MG tablet      Meds ordered this encounter  Medications   fluconazole (DIFLUCAN) 100 MG tablet    Sig: Take 2 tabs po today then one tab po qd x 14 days    Dispense:  16 tablet    Refill:  0    Order Specific Question:   Supervising Provider    Answer:   Sallee Lange A [[id number]]   Advised patient to rinse out his mouth following every use of Symbicort. Recommend monitoring his blood pressure outside of the office. He will consider repeating the low-dose CT scan of the lungs this fall following his colonoscopy. Start fluconazole as directed.  Call back if symptoms worsen or persist.

## 2020-12-13 ENCOUNTER — Ambulatory Visit (HOSPITAL_COMMUNITY): Payer: PPO | Admitting: Anesthesiology

## 2020-12-13 ENCOUNTER — Ambulatory Visit (HOSPITAL_COMMUNITY)
Admission: RE | Admit: 2020-12-13 | Discharge: 2020-12-13 | Disposition: A | Discharge status: Home / Self Care | Payer: PPO | Attending: Internal Medicine | Admitting: Internal Medicine

## 2020-12-13 ENCOUNTER — Encounter (HOSPITAL_COMMUNITY): Payer: Self-pay

## 2020-12-13 ENCOUNTER — Encounter (HOSPITAL_COMMUNITY)
Admission: RE | Disposition: A | Discharge status: Home / Self Care | Payer: Self-pay | Source: Home / Self Care | Attending: Internal Medicine

## 2020-12-13 DIAGNOSIS — Z79899 Other long term (current) drug therapy: Secondary | ICD-10-CM | POA: Insufficient documentation

## 2020-12-13 DIAGNOSIS — K648 Other hemorrhoids: Secondary | ICD-10-CM | POA: Insufficient documentation

## 2020-12-13 DIAGNOSIS — J449 Chronic obstructive pulmonary disease, unspecified: Secondary | ICD-10-CM | POA: Insufficient documentation

## 2020-12-13 DIAGNOSIS — K5909 Other constipation: Secondary | ICD-10-CM | POA: Diagnosis not present

## 2020-12-13 DIAGNOSIS — D123 Benign neoplasm of transverse colon: Secondary | ICD-10-CM | POA: Diagnosis not present

## 2020-12-13 DIAGNOSIS — Z87891 Personal history of nicotine dependence: Secondary | ICD-10-CM | POA: Diagnosis not present

## 2020-12-13 DIAGNOSIS — K5730 Diverticulosis of large intestine without perforation or abscess without bleeding: Secondary | ICD-10-CM | POA: Insufficient documentation

## 2020-12-13 DIAGNOSIS — D122 Benign neoplasm of ascending colon: Secondary | ICD-10-CM | POA: Insufficient documentation

## 2020-12-13 DIAGNOSIS — R634 Abnormal weight loss: Secondary | ICD-10-CM | POA: Diagnosis not present

## 2020-12-13 DIAGNOSIS — Z7982 Long term (current) use of aspirin: Secondary | ICD-10-CM | POA: Diagnosis not present

## 2020-12-13 DIAGNOSIS — Z681 Body mass index (BMI) 19 or less, adult: Secondary | ICD-10-CM | POA: Insufficient documentation

## 2020-12-13 DIAGNOSIS — K635 Polyp of colon: Secondary | ICD-10-CM

## 2020-12-13 DIAGNOSIS — R195 Other fecal abnormalities: Secondary | ICD-10-CM | POA: Diagnosis not present

## 2020-12-13 HISTORY — PX: COLONOSCOPY WITH PROPOFOL: SHX5780

## 2020-12-13 HISTORY — PX: POLYPECTOMY: SHX5525

## 2020-12-13 LAB — GLUCOSE, CAPILLARY: Glucose-Capillary: 80 mg/dL (ref 70–99)

## 2020-12-13 SURGERY — COLONOSCOPY WITH PROPOFOL
Anesthesia: General

## 2020-12-13 MED ORDER — PROPOFOL 10 MG/ML IV BOLUS
INTRAVENOUS | Status: DC | PRN
  Administered 2020-12-13: 80 mg via INTRAVENOUS
  Administered 2020-12-13: 125 ug/kg/min via INTRAVENOUS

## 2020-12-13 MED ORDER — LIDOCAINE HCL (CARDIAC) PF 50 MG/5ML IV SOSY
PREFILLED_SYRINGE | INTRAVENOUS | Status: DC | PRN
  Administered 2020-12-13: 60 mg via INTRAVENOUS

## 2020-12-13 MED ORDER — LACTATED RINGERS IV SOLN: INTRAVENOUS | Status: DC | PRN

## 2020-12-13 NOTE — Op Note (Signed)
[facility] Patient Name: George Meyer Procedure Date: 12/13/2020 2:03 PM MRN: [id number] Date of Birth: Jan 30, 1941 Attending MD: Elon Alas. Edgar Frisk CSN: [id number] Age: 80 Admit Type: Outpatient Procedure:                Colonoscopy Indications:              Heme positive stool, Weight loss Providers:                Elon Alas. Abbey Chatters, DO, Gwenlyn Fudge, RN, Randa Spike, Technician Referring MD:              Medicines:                See the Anesthesia note for documentation of the                            administered medications Complications:            No immediate complications. Estimated Blood Loss:     Estimated blood loss was minimal. Procedure:                Pre-Anesthesia Assessment:                           - The anesthesia plan was to use monitored                            anesthesia care (MAC).                           After obtaining informed consent, the colonoscope                            was passed under direct vision. Throughout the                            procedure, the patient's blood pressure, pulse, and                            oxygen saturations were monitored continuously. The                            [id number]-[id number] ([id number]) scope was introduced through                            the anus and advanced to the the cecum, identified                            by appendiceal orifice and ileocecal valve. The                            colonoscopy was performed without difficulty. The                            patient tolerated the procedure well. The  quality                            of the bowel preparation was evaluated using the                            BBPS [facility] Bowel Preparation Scale) with scores                            of: Right Colon = 3, Transverse Colon = 3 and Left                            Colon = 3 (entire mucosa seen well with no residual                            staining, small  fragments of stool or opaque                            liquid). The total BBPS score equals 9. Scope In: 2:10:46 PM Scope Out: 2:22:44 PM Scope Withdrawal Time: 0 hours 8 minutes 7 seconds  Total Procedure Duration: 0 hours 11 minutes 58 seconds  Findings:      The perianal and digital rectal examinations were normal.      Non-bleeding internal hemorrhoids were found during endoscopy.      Multiple small and large-mouthed diverticula were found in the sigmoid       colon and descending colon.      Four sessile polyps were found in the transverse colon and ascending       colon. The polyps were 3 to 5 mm in size. These polyps were removed with       a cold snare. Resection and retrieval were complete.      The terminal ileum appeared normal. Impression:               - Non-bleeding internal hemorrhoids.                           - Diverticulosis in the sigmoid colon and in the                            descending colon.                           - Four 3 to 5 mm polyps in the transverse colon and                            in the ascending colon, removed with a cold snare.                            Resected and retrieved.                           - The examined portion of the ileum was normal. Moderate Sedation:      Per Anesthesia Care Recommendation:           - Patient has  a contact number available for                            emergencies. The signs and symptoms of potential                            delayed complications were discussed with the                            patient. Return to normal activities tomorrow.                            Written discharge instructions were provided to the                            patient.                           - Resume previous diet.                           - Continue present medications.                           - Await pathology results.                           - No repeat colonoscopy due to age.                            - Return to GI clinic in 3 months. Procedure Code(s):        --- Professional ---                           [phone], Colonoscopy, flexible; with removal of                            tumor(s), polyp(s), or other lesion(s) by snare                            technique Diagnosis Code(s):        --- Professional ---                           K64.8, Other hemorrhoids                           K63.5, Polyp of colon                           R19.5, Other fecal abnormalities                           R63.4, Abnormal weight loss                           K57.30, Diverticulosis of large intestine without  perforation or abscess without bleeding CPT copyright 2019 American Medical Association. All rights reserved. The codes documented in this report are preliminary and upon coder review may  be revised to meet current compliance requirements. Elon Alas. Abbey Chatters, DO [place] Abbey Chatters, DO 12/13/2020 2:26:09 PM This report has been signed electronically. Number of Addenda: 0

## 2020-12-13 NOTE — Transfer of Care (Signed)
Immediate Anesthesia Transfer of Care Note  Patient: George Meyer  Procedure(s) Performed: COLONOSCOPY WITH PROPOFOL POLYPECTOMY  Patient Location: Short Stay  Anesthesia Type:General  Level of Consciousness: awake, alert , oriented and patient cooperative  Airway & Oxygen Therapy: Patient Spontanous Breathing  Post-op Assessment: Report given to RN, Post -op Vital signs reviewed and stable and Patient moving all extremities  Post vital signs: Reviewed and stable  Last Vitals:  Vitals Value Taken Time  BP    Temp    Pulse    Resp    SpO2      Last Pain:  Vitals:   12/13/20 1410  TempSrc:   PainSc: 0-No pain         Complications: No notable events documented.

## 2020-12-13 NOTE — Discharge Instructions (Addendum)
  Colonoscopy Discharge Instructions  Read the instructions outlined below and refer to this sheet in the next few weeks. These discharge instructions provide you with general information on caring for yourself after you leave the hospital. Your doctor may also give you specific instructions. While your treatment has been planned according to the most current medical practices available, unavoidable complications occasionally occur.   ACTIVITY You may resume your regular activity, but move at a slower pace for the next 24 hours.  Take frequent rest periods for the next 24 hours.  Walking will help get rid of the air and reduce the bloated feeling in your belly (abdomen).  No driving for 24 hours (because of the medicine (anesthesia) used during the test).   Do not sign any important legal documents or operate any machinery for 24 hours (because of the anesthesia used during the test).  NUTRITION Drink plenty of fluids.  You may resume your normal diet as instructed by your doctor.  Begin with a light meal and progress to your normal diet. Heavy or fried foods are harder to digest and may make you feel sick to your stomach (nauseated).  Avoid alcoholic beverages for 24 hours or as instructed.  MEDICATIONS You may resume your normal medications unless your doctor tells you otherwise.  WHAT YOU CAN EXPECT TODAY Some feelings of bloating in the abdomen.  Passage of more gas than usual.  Spotting of blood in your stool or on the toilet paper.  IF YOU HAD POLYPS REMOVED DURING THE COLONOSCOPY: No aspirin products for 7 days or as instructed.  No alcohol for 7 days or as instructed.  Eat a soft diet for the next 24 hours.  FINDING OUT THE RESULTS OF YOUR TEST Not all test results are available during your visit. If your test results are not back during the visit, make an appointment with your caregiver to find out the results. Do not assume everything is normal if you have not heard from your  caregiver or the medical facility. It is important for you to follow up on all of your test results.  SEEK IMMEDIATE MEDICAL ATTENTION IF: You have more than a spotting of blood in your stool.  Your belly is swollen (abdominal distention).  You are nauseated or vomiting.  You have a temperature over 101.  You have abdominal pain or discomfort that is severe or gets worse throughout the day.   Your colonoscopy revealed 4 polyp(s) which I removed successfully. Await pathology results, my office will contact you. You also have diverticulosis and internal hemorrhoids. I would recommend increasing fiber in your diet or adding OTC Benefiber/Metamucil. Be sure to drink at least 4 to 6 glasses of water daily. Follow-up with GI in 3 months    I hope you have a great rest of your week!  Elon Alas. Abbey Chatters, D.O. Gastroenterology and Hepatology [facility]

## 2020-12-13 NOTE — Interval H&P Note (Signed)
History and Physical Interval Note:  12/13/2020 1:47 PM  George Meyer  has presented today for surgery, with the diagnosis of heme positive, wt loss, poor appetite, RB, constiption.  The various methods of treatment have been discussed with the patient and family. After consideration of risks, benefits and other options for treatment, the patient has consented to  Procedure(s) with comments: COLONOSCOPY WITH PROPOFOL (N/A) - 2:45pm as a surgical intervention.  The patient's history has been reviewed, patient examined, no change in status, stable for surgery.  I have reviewed the patient's chart and labs.  Questions were answered to the patient's satisfaction.     Eloise Harman

## 2020-12-13 NOTE — Anesthesia Preprocedure Evaluation (Addendum)
Anesthesia Evaluation  Patient identified by MRN, date of birth, ID band Patient awake    Reviewed: Allergy & Precautions, H&P , NPO status , Patient's Chart, lab work & pertinent test results, reviewed documented beta blocker date and time   Airway Mallampati: II  TM Distance: >3 FB Neck ROM: full    Dental no notable dental hx.    Pulmonary COPD,  COPD inhaler, former smoker,    Pulmonary exam normal breath sounds clear to auscultation       Cardiovascular Exercise Tolerance: Good hypertension, negative cardio ROS   Rhythm:regular Rate:Normal     Neuro/Psych PSYCHIATRIC DISORDERS Anxiety Depression  Neuromuscular disease    GI/Hepatic negative GI ROS, Neg liver ROS,   Endo/Other  negative endocrine ROS  Renal/GU negative Renal ROS  negative genitourinary   Musculoskeletal   Abdominal   Peds  Hematology negative hematology ROS (+)   Anesthesia Other Findings   Reproductive/Obstetrics negative OB ROS                            Anesthesia Physical Anesthesia Plan  ASA: 3  Anesthesia Plan: General   Post-op Pain Management:    Induction:   PONV Risk Score and Plan: Propofol infusion  Airway Management Planned:   Additional Equipment:   Intra-op Plan:   Post-operative Plan:   Informed Consent: I have reviewed the patients History and Physical, chart, labs and discussed the procedure including the risks, benefits and alternatives for the proposed anesthesia with the patient or authorized representative who has indicated his/her understanding and acceptance.     Dental Advisory Given  Plan Discussed with: CRNA  Anesthesia Plan Comments:         Anesthesia Quick Evaluation

## 2020-12-13 NOTE — Anesthesia Postprocedure Evaluation (Signed)
Anesthesia Post Note  Patient: George Meyer  Procedure(s) Performed: COLONOSCOPY WITH PROPOFOL POLYPECTOMY  Patient location during evaluation: Phase II Anesthesia Type: General Level of consciousness: awake Pain management: pain level controlled Vital Signs Assessment: post-procedure vital signs reviewed and stable Respiratory status: spontaneous breathing and respiratory function stable Cardiovascular status: blood pressure returned to baseline and stable Postop Assessment: no headache and no apparent nausea or vomiting Anesthetic complications: no Comments: Late entry   No notable events documented.   Last Vitals:  Vitals:   12/13/20 1340 12/13/20 1435  BP:  110/70  Pulse: 65 71  Resp: (!) 24 20  Temp: (!) 36.4 C 36.7 C  SpO2: 92% 100%    Last Pain:  Vitals:   12/13/20 1435  TempSrc: Oral  PainSc: 0-No pain                 Louann Sjogren

## 2020-12-15 ENCOUNTER — Ambulatory Visit: Payer: PPO | Admitting: Family Medicine

## 2020-12-15 LAB — SURGICAL PATHOLOGY

## 2020-12-20 ENCOUNTER — Encounter (HOSPITAL_COMMUNITY): Payer: Self-pay | Admitting: Internal Medicine

## 2021-01-03 ENCOUNTER — Inpatient Hospital Stay (HOSPITAL_COMMUNITY): Payer: PPO | Attending: Hematology

## 2021-01-03 ENCOUNTER — Other Ambulatory Visit: Payer: Self-pay

## 2021-01-03 DIAGNOSIS — Z79899 Other long term (current) drug therapy: Secondary | ICD-10-CM | POA: Insufficient documentation

## 2021-01-03 DIAGNOSIS — Z801 Family history of malignant neoplasm of trachea, bronchus and lung: Secondary | ICD-10-CM | POA: Insufficient documentation

## 2021-01-03 DIAGNOSIS — I10 Essential (primary) hypertension: Secondary | ICD-10-CM | POA: Insufficient documentation

## 2021-01-03 DIAGNOSIS — D75839 Thrombocytosis, unspecified: Secondary | ICD-10-CM | POA: Insufficient documentation

## 2021-01-03 DIAGNOSIS — D72829 Elevated white blood cell count, unspecified: Secondary | ICD-10-CM | POA: Insufficient documentation

## 2021-01-03 DIAGNOSIS — E538 Deficiency of other specified B group vitamins: Secondary | ICD-10-CM

## 2021-01-03 DIAGNOSIS — Z87891 Personal history of nicotine dependence: Secondary | ICD-10-CM | POA: Diagnosis not present

## 2021-01-03 DIAGNOSIS — Z85828 Personal history of other malignant neoplasm of skin: Secondary | ICD-10-CM | POA: Diagnosis not present

## 2021-01-03 DIAGNOSIS — E785 Hyperlipidemia, unspecified: Secondary | ICD-10-CM | POA: Insufficient documentation

## 2021-01-03 DIAGNOSIS — Z7982 Long term (current) use of aspirin: Secondary | ICD-10-CM | POA: Insufficient documentation

## 2021-01-03 DIAGNOSIS — J449 Chronic obstructive pulmonary disease, unspecified: Secondary | ICD-10-CM | POA: Diagnosis not present

## 2021-01-03 DIAGNOSIS — D72820 Lymphocytosis (symptomatic): Secondary | ICD-10-CM

## 2021-01-03 DIAGNOSIS — E559 Vitamin D deficiency, unspecified: Secondary | ICD-10-CM | POA: Insufficient documentation

## 2021-01-03 LAB — CBC WITH DIFFERENTIAL/PLATELET
Abs Immature Granulocytes: 0.03 10*3/uL (ref 0.00–0.07)
Basophils Absolute: 0.1 10*3/uL (ref 0.0–0.1)
Basophils Relative: 1 %
Eosinophils Absolute: 1.2 10*3/uL — ABNORMAL HIGH (ref 0.0–0.5)
Eosinophils Relative: 12 %
HCT: 48.4 % (ref 39.0–52.0)
Hemoglobin: 15.7 g/dL (ref 13.0–17.0)
Immature Granulocytes: 0 %
Lymphocytes Relative: 33 %
Lymphs Abs: 3.4 10*3/uL (ref 0.7–4.0)
MCH: 31.8 pg (ref 26.0–34.0)
MCHC: 32.4 g/dL (ref 30.0–36.0)
MCV: 98 fL (ref 80.0–100.0)
Monocytes Absolute: 0.8 10*3/uL (ref 0.1–1.0)
Monocytes Relative: 8 %
Neutro Abs: 4.7 10*3/uL (ref 1.7–7.7)
Neutrophils Relative %: 46 %
Platelets: 301 10*3/uL (ref 150–400)
RBC: 4.94 MIL/uL (ref 4.22–5.81)
RDW: 14.4 % (ref 11.5–15.5)
WBC: 10.2 10*3/uL (ref 4.0–10.5)
nRBC: 0 % (ref 0.0–0.2)

## 2021-01-03 LAB — COMPREHENSIVE METABOLIC PANEL
ALT: 13 U/L (ref 0–44)
AST: 16 U/L (ref 15–41)
Albumin: 3.9 g/dL (ref 3.5–5.0)
Alkaline Phosphatase: 49 U/L (ref 38–126)
Anion gap: 6 (ref 5–15)
BUN: 12 mg/dL (ref 8–23)
CO2: 32 mmol/L (ref 22–32)
Calcium: 8.9 mg/dL (ref 8.9–10.3)
Chloride: 105 mmol/L (ref 98–111)
Creatinine, Ser: 0.69 mg/dL (ref 0.61–1.24)
GFR, Estimated: >60 mL/min (ref >60)
Glucose, Bld: 85 mg/dL (ref 70–99)
Potassium: 3.9 mmol/L (ref 3.5–5.1)
Sodium: 143 mmol/L (ref 135–145)
Total Bilirubin: 0.3 mg/dL (ref 0.3–1.2)
Total Protein: 6.7 g/dL (ref 6.5–8.1)

## 2021-01-03 LAB — VITAMIN D 25 HYDROXY (VIT D DEFICIENCY, FRACTURES): Vit D, 25-Hydroxy: 15.97 ng/mL — ABNORMAL LOW (ref 30–100)

## 2021-01-03 LAB — VITAMIN B12: Vitamin B-12: 591 pg/mL (ref 180–914)

## 2021-01-03 LAB — LACTATE DEHYDROGENASE: LDH: 113 U/L (ref 98–192)

## 2021-01-05 ENCOUNTER — Inpatient Hospital Stay (HOSPITAL_COMMUNITY): Payer: PPO

## 2021-01-11 ENCOUNTER — Ambulatory Visit: Payer: PPO | Admitting: Family Medicine

## 2021-01-11 NOTE — Progress Notes (Signed)
George Meyer, [place] [address]   CLINIC:  Medical Oncology/Hematology  PCP:  Kathyrn Drown, MD [address] [place] [address] [phone]   REASON FOR VISIT:  Follow-up for leukocytosis and thrombocytosis  PRIOR THERAPY: None  CURRENT THERAPY: Observation  INTERVAL HISTORY:  George Meyer 80 y.o. male returns for routine follow-up of leukocytosis and thrombocytosis.  He was last seen by NP Francene Finders on 01/13/2020.  At today's visit, he reports feeling well.  No recent hospitalizations, surgeries, or changes in baseline health status.  He reports that he stopped smoking 2 months ago.  No B symptoms such as fever, chills, night sweats, unintentional weight loss.  He denies recent infections.  He denies systemic steroid use, although he does have steroid inhaler for his COPD.  Denies blood clots or vasomotor symptoms.  He has 40% energy and low appetite, but forces himself to eat. He endorses that he is maintaining a relatively stable weight, has lost 5 to 6 pounds in the past year.   REVIEW OF SYSTEMS:  Review of Systems  Constitutional:  Negative for appetite change, chills, diaphoresis, fatigue, fever and unexpected weight change.  HENT:   Negative for lump/mass and nosebleeds.   Eyes:  Negative for eye problems.  Respiratory:  Negative for cough, hemoptysis and shortness of breath.   Cardiovascular:  Negative for chest pain, leg swelling and palpitations.  Gastrointestinal:  Negative for abdominal pain, blood in stool, constipation, diarrhea, nausea and vomiting.  Genitourinary:  Negative for hematuria.   Skin: Negative.   Neurological:  Negative for dizziness, headaches and light-headedness.  Hematological:  Does not bruise/bleed easily.     PAST MEDICAL/SURGICAL HISTORY:  Past Medical History:  Diagnosis Date   Anxiety    Cataracts, bilateral    removed by surgery   COPD (chronic obstructive pulmonary  disease) ([facility])    Depression, major, single episode, moderate ([facility]) 12/18/2018   Glaucoma    both eyes   Glaucoma    bilateral   H. pylori infection 09/17/2019   S/p treatment with Prevpac. Documented eradiation via breath test on 11/03/19.    Hyperlipidemia    Hypertension    Pre-diabetes    diet controlled   Skin cancer, basal cell    arms, face   Wears dentures    full   Past Surgical History:  Procedure Laterality Date   BIOPSY  09/17/2019   Procedure: BIOPSY;  Surgeon: Daneil Dolin, MD;  [facility];  Service: Endoscopy;;  gastric   CARDIAC CATHETERIZATION N/A 12/2005   negative   COLONOSCOPY N/A 12/2009   small hyperplastic polyp repeat in 10 years   COLONOSCOPY WITH PROPOFOL N/A 12/13/2020   Procedure: COLONOSCOPY WITH PROPOFOL;  Surgeon: Eloise Harman, DO;  [facility];  Service: Endoscopy;  Laterality: N/A;  2:45pm   DUPUYTREN CONTRACTURE RELEASE Left 02/02/2019   Procedure: EXCISION LEFT HAND AND SMALL FINGER DUPUYTREN CONTRACTURE RELEASE;  Surgeon: Marybelle Killings, MD;  Location: [facility];  Service: Orthopedics;  Laterality: Left;   ESOPHAGOGASTRODUODENOSCOPY (EGD) WITH PROPOFOL N/A 09/17/2019   Procedure: ESOPHAGOGASTRODUODENOSCOPY (EGD) WITH PROPOFOL;  Surgeon: Daneil Dolin, MD; esophageal labs/Schatzki's ring noncritical, not manipulated.  Small hiatal hernia.  Inflamed stomach with biopsies revealing chronic active gastritis with H. pylori.  H. pylori treated with Prevpac.   EYE SURGERY Bilateral    remove cataracts   HAND SURGERY Right 2010   dupuytrens excision right  little finger   POLYPECTOMY  12/13/2020   Procedure: POLYPECTOMY;  Surgeon: Eloise Harman, DO;  [facility];  Service: Endoscopy;;   TONSILLECTOMY     WISDOM TOOTH EXTRACTION       SOCIAL HISTORY:  Social History   Socioeconomic History   Marital status: Single    Spouse name: Not on file   Number of children: Not on file   Years of education: Not on  file   Highest education level: Not on file  Occupational History   Not on file  Tobacco Use   Smoking status: Former    Packs/day: 1.00    Years: 66.00    Pack years: 66.00    Types: Cigarettes    Quit date: 11/09/2020    Years since quitting: 0.1   Smokeless tobacco: Never   Tobacco comments:    started smoking age 3  Vaping Use   Vaping Use: Never used  Substance and Sexual Activity   Alcohol use: No   Drug use: No   Sexual activity: Yes    Partners: Female  Other Topics Concern   Not on file  Social History Narrative   Not on file   Social Determinants of Health   Financial Resource Strain: Not on file  Food Insecurity: Not on file  Transportation Needs: Not on file  Physical Activity: Not on file  Stress: Not on file  Social Connections: Not on file  Intimate Partner Violence: Not on file    FAMILY HISTORY:  Family History  Problem Relation Age of Onset   Heart attack Father    Heart attack Mother    Lung cancer Brother    Colon cancer Neg Hx     CURRENT MEDICATIONS:  Outpatient Encounter Medications as of 01/12/2021  Medication Sig Note   albuterol (PROVENTIL) (2.5 MG/3ML) 0.083% nebulizer solution USE 1 VIAL IN NEBULIZER EVERY 4 HOURS AS NEEDED FOR WHEEZING (Patient taking differently: Take 2.5 mg by nebulization every 4 (four) hours as needed for shortness of breath or wheezing.)    albuterol (VENTOLIN HFA) 108 (90 Base) MCG/ACT inhaler Inhale 2 puffs into the lungs every 4 (four) hours as needed. (Patient taking differently: Inhale 2 puffs into the lungs every 4 (four) hours as needed for wheezing or shortness of breath.)    ALPRAZolam (XANAX) 0.5 MG tablet Take 0.5 mg by mouth at bedtime.    aspirin EC 81 MG tablet Take 81 mg by mouth daily.    budesonide-formoterol (SYMBICORT) 160-4.5 MCG/ACT inhaler INHALE TWO PUFFS BY MOUTH TWICE DAILY ** STOP SPIRIVA ** (Patient taking differently: Inhale 2 puffs into the lungs 2 (two) times daily.)    docusate  sodium (COLACE) 100 MG capsule Take 100 mg by mouth 2 (two) times daily as needed for mild constipation.    dorzolamide-timolol (COSOPT) 22.3-6.8 MG/ML ophthalmic solution Place 1 drop into both eyes 2 (two) times daily.    fluconazole (DIFLUCAN) 100 MG tablet Take 2 tabs po today then one tab po qd x 14 days    lisinopril (ZESTRIL) 2.5 MG tablet Take 1 tablet (2.5 mg total) by mouth daily.    lubiprostone (AMITIZA) 8 MCG capsule Take one capsule once to twice daily WITH FOOD for constipation. (Patient taking differently: Take 8 mcg by mouth 2 (two) times daily with a meal.) 12/01/2020: Hasnt started   mirtazapine (REMERON) 15 MG tablet Take 1 tablet (15 mg total) by mouth at bedtime.    nitroGLYCERIN (NITROSTAT) 0.4 MG SL  tablet 1 sl q 5 min prn chest pressure as directed (Patient taking differently: Place 0.4 mg under the tongue every 5 (five) minutes as needed for chest pain.)    nystatin (MYCOSTATIN) 100000 UNIT/ML suspension Take 5 mLs (500,000 Units total) by mouth 4 (four) times daily. Swish and swallow    ondansetron (ZOFRAN ODT) 8 MG disintegrating tablet Take 1 tablet 3 x daily has needed (Patient taking differently: Take 8 mg by mouth 3 (three) times daily as needed for vomiting or nausea.)    pantoprazole (PROTONIX) 40 MG tablet Take 1 tablet (40 mg total) by mouth daily before breakfast.    pravastatin (PRAVACHOL) 40 MG tablet Take 1 tablet (40 mg total) by mouth at bedtime.    predniSONE (DELTASONE) 20 MG tablet Take 20-80 mg by mouth daily as needed (for respiratory issues).    Saw Palmetto 450 MG CAPS Take 450 mg by mouth in the morning and at bedtime.    No facility-administered encounter medications on file as of 01/12/2021.    ALLERGIES:  Allergies  Allergen Reactions   Amlodipine Itching   Chlorzoxazone Itching     PHYSICAL EXAM:  ECOG PERFORMANCE STATUS: 0 - Asymptomatic  There were no vitals filed for this visit. There were no vitals filed for this visit. Physical  Exam Constitutional:      Appearance: Normal appearance.  HENT:     Head: Normocephalic and atraumatic.     Mouth/Throat:     Mouth: Mucous membranes are moist.  Eyes:     Extraocular Movements: Extraocular movements intact.     Pupils: Pupils are equal, round, and reactive to light.  Cardiovascular:     Rate and Rhythm: Normal rate and regular rhythm.     Pulses: Normal pulses.     Heart sounds: Normal heart sounds.  Pulmonary:     Effort: Pulmonary effort is normal.     Breath sounds: Normal breath sounds.  Abdominal:     General: Bowel sounds are normal.     Palpations: Abdomen is soft.     Tenderness: There is no abdominal tenderness.  Musculoskeletal:        General: No swelling.     Right lower leg: No edema.     Left lower leg: No edema.  Lymphadenopathy:     Cervical: No cervical adenopathy.  Skin:    General: Skin is warm and dry.  Neurological:     General: No focal deficit present.     Mental Status: He is alert and oriented to person, place, and time.  Psychiatric:        Mood and Affect: Mood normal.        Behavior: Behavior normal.     LABORATORY DATA:  I have reviewed the labs as listed.  CBC    Component Value Date/Time   WBC 10.2 01/03/2021 1246   RBC 4.94 01/03/2021 1246   HGB 15.7 01/03/2021 1246   HGB 16.6 11/07/2020 0926   HCT 48.4 01/03/2021 1246   HCT 50.4 11/07/2020 0926   PLT 301 01/03/2021 1246   PLT 357 11/07/2020 0926   MCV 98.0 01/03/2021 1246   MCV 94 11/07/2020 0926   MCH 31.8 01/03/2021 1246   MCHC 32.4 01/03/2021 1246   RDW 14.4 01/03/2021 1246   RDW 13.7 11/07/2020 0926   LYMPHSABS 3.4 01/03/2021 1246   LYMPHSABS 2.6 11/07/2020 0926   MONOABS 0.8 01/03/2021 1246   EOSABS 1.2 (H) 01/03/2021 1246   EOSABS 1.3 (H) 11/07/2020  0926   BASOSABS 0.1 01/03/2021 1246   BASOSABS 0.1 11/07/2020 0926   CMP Latest Ref Rng & Units 01/03/2021 11/07/2020 09/19/2020  Glucose 70 - 99 mg/dL 85 66 93  BUN 8 - 23 mg/dL 12 9 9   Creatinine  0.61 - 1.24 mg/dL 0.69 0.81 0.74(L)  Sodium 135 - 145 mmol/L 143 138 142  Potassium 3.5 - 5.1 mmol/L 3.9 4.3 5.1  Chloride 98 - 111 mmol/L 105 97 100  CO2 22 - 32 mmol/L 32 28 28  Calcium 8.9 - 10.3 mg/dL 8.9 9.2 9.3  Total Protein 6.5 - 8.1 g/dL 6.7 6.5 6.8  Total Bilirubin 0.3 - 1.2 mg/dL 0.3 0.7 0.5  Alkaline Phos 38 - 126 U/L 49 69 70  AST 15 - 41 U/L 16 21 11   ALT 0 - 44 U/L 13 12 6     DIAGNOSTIC IMAGING:  I have independently reviewed the relevant imaging and discussed with the patient.  ASSESSMENT & PLAN: 1.  Leukocytosis and thrombocytosis: - He had an extensive evaluation for JAK2/CALR/MPL and BCR/ABL which was negative. - Platelets and white blood cells have since normalized - Most recent labs (01/03/2021) show normal CBC with WBC 10.2, Hgb 15.7, platelets 301 - Leukocytosis and thrombocytosis were likely reactive secondary to smoking or use of steroid inhaler for COPD - PLAN: Leukocytosis and thrombocytosis have resolved, possibly due to the fact that the patient stopped smoking.  Regardless, malignancy and myeloproliferative causes have been ruled out, and the patient is stable for discharge from hematology clinic.  He is agreeable to this, and is aware that he can return here as needed in the future.  2.  Tobacco abuse: -Patient has a history of smoking greater than 30 years and is over the age of 45.  He quit smoking in May 2022. -Low-dose CT lung cancer screening done on 12/30/2018 was lung RADS 2 - PLAN: Patient has aged out of the LDCT lung cancer screening program (maximum age 79 years old).  He is aware of alarm symptoms that would prompt work-up for possible lung cancer, but denies current unexplained cough, hemoptysis, unintentional weight loss.  3.  Vitamin D deficiency - Most recent labs (01/03/2021) show low vitamin D at 15.97 - PLAN: Start patient on vitamin D supplementation with ergocalciferol 50,000 units weekly.  Patient can follow with PCP for ongoing vitamin D  checks and repletion.   PLAN SUMMARY & DISPOSITION: - Started on vitamin D supplementation, which can be followed by PCP - Discharged from clinic, can follow-up as needed at the discretion of patient/PCP if issues arise in the future  All questions were answered. The patient knows to call the clinic with any problems, questions or concerns.  Medical decision making: Low  Time spent on visit: I spent 15 minutes counseling the patient face to face. The total time spent in the appointment was 25 minutes and more than 50% was on counseling.   Harriett Rush, PA-C  01/12/2021 11:41 AM

## 2021-01-12 ENCOUNTER — Ambulatory Visit (HOSPITAL_COMMUNITY): Payer: PPO | Admitting: Nurse Practitioner

## 2021-01-12 ENCOUNTER — Other Ambulatory Visit: Payer: Self-pay

## 2021-01-12 ENCOUNTER — Inpatient Hospital Stay (HOSPITAL_COMMUNITY): Payer: PPO | Admitting: Physician Assistant

## 2021-01-12 ENCOUNTER — Ambulatory Visit (HOSPITAL_COMMUNITY): Payer: PPO | Admitting: Hematology

## 2021-01-12 VITALS — BP 174/71 | HR 69 | Temp 97.8°F | Resp 16 | Wt 122.0 lb

## 2021-01-12 DIAGNOSIS — D72829 Elevated white blood cell count, unspecified: Secondary | ICD-10-CM

## 2021-01-12 DIAGNOSIS — E559 Vitamin D deficiency, unspecified: Secondary | ICD-10-CM | POA: Diagnosis not present

## 2021-01-12 DIAGNOSIS — D75839 Thrombocytosis, unspecified: Secondary | ICD-10-CM | POA: Diagnosis not present

## 2021-01-12 MED ORDER — ERGOCALCIFEROL 1.25 MG (50000 UT) PO CAPS: 50000.0000 [IU] | ORAL_CAPSULE | ORAL | 5 refills | Status: DC

## 2021-01-12 NOTE — Patient Instructions (Signed)
[place] at [facility] Discharge Instructions  You were seen today by Tarri Abernethy PA-C for your history of elevated blood counts.  You previously were known to have elevated white blood cells and elevated platelets, but testing for underlying cancerous states was NEGATIVE.  Your blood count abnormalities were most likely secondary to smoking.  Since you have stopped smoking, your blood counts have normalized.  There is no need for you to continue to see [place] with regularly scheduled visits at this time.  However, if you develop further abnormalities or symptoms in the future, we would be happy to see you again.   OTHER: Please be aware of "alarm symptoms," such as unexplained fever, chills, night sweats, or unintentional weight loss.  You are also at risk of developing lung cancer due to your history of tobacco use.  Symptoms to be aware of include unexplained cough, coughing up blood, or unintentional weight loss.  If any of these symptoms occur, please contact your primary care doctor as soon as possible.  MEDICATIONS: Start taking vitamin D (ergocalciferol) 50,000 units each week.  Your vitamin D deficiency can be followed by your primary care doctor.  FOLLOW-UP APPOINTMENT: No need for follow-up with hematology clinic at this time.   Thank you for choosing [place] at [facility] to provide your oncology and hematology care.  To afford each patient quality time with our provider, please arrive at least 15 minutes before your scheduled appointment time.   If you have a lab appointment with the [facility] please come in thru the Main Entrance and check in at the main information desk.  You need to re-schedule your appointment should you arrive 10 or more minutes late.  We strive to give you quality time with our providers, and arriving late affects you and other patients whose appointments are after yours.  Also, if you no show three or  more times for appointments you may be dismissed from the clinic at the providers discretion.     Again, thank you for choosing [facility].  Our hope is that these requests will decrease the amount of time that you wait before being seen by our physicians.       _____________________________________________________________  Should you have questions after your visit to [facility], please contact our office at [phone] and follow the prompts.  Our office hours are 8:00 a.m. and 4:30 p.m. Monday - Friday.  Please note that voicemails left after 4:00 p.m. may not be returned until the following business day.  We are closed weekends and major holidays.  You do have access to a nurse 24-7, just call the main number to the clinic [phone] and do not press any options, hold on the line and a nurse will answer the phone.    For prescription refill requests, have your pharmacy contact our office and allow 72 hours.    Due to Covid, you will need to wear a mask upon entering the hospital. If you do not have a mask, a mask will be given to you at the Main Entrance upon arrival. For doctor visits, patients may have 1 support person age 40 or older with them. For treatment visits, patients can not have anyone with them due to social distancing guidelines and our immunocompromised population.

## 2021-01-17 ENCOUNTER — Other Ambulatory Visit: Payer: Self-pay | Admitting: Nurse Practitioner

## 2021-01-23 ENCOUNTER — Other Ambulatory Visit: Payer: Self-pay

## 2021-01-23 ENCOUNTER — Encounter: Payer: Self-pay | Admitting: Family Medicine

## 2021-01-23 ENCOUNTER — Ambulatory Visit (INDEPENDENT_AMBULATORY_CARE_PROVIDER_SITE_OTHER): Payer: PPO | Admitting: Family Medicine

## 2021-01-23 VITALS — BP 142/86 | HR 68 | Wt 124.0 lb

## 2021-01-23 DIAGNOSIS — I10 Essential (primary) hypertension: Secondary | ICD-10-CM

## 2021-01-23 DIAGNOSIS — E782 Mixed hyperlipidemia: Secondary | ICD-10-CM

## 2021-01-23 DIAGNOSIS — L237 Allergic contact dermatitis due to plants, except food: Secondary | ICD-10-CM

## 2021-01-23 MED ORDER — LISINOPRIL 5 MG PO TABS: 2.5000 mg | ORAL_TABLET | Freq: Every day | ORAL | 1 refills | Status: DC

## 2021-01-23 MED ORDER — CLOBETASOL PROPIONATE 0.05 % EX CREA: TOPICAL_CREAM | CUTANEOUS | 1 refills | Status: DC

## 2021-01-23 MED ORDER — PREDNISONE 20 MG PO TABS: ORAL_TABLET | ORAL | 0 refills | Status: DC

## 2021-01-23 NOTE — Progress Notes (Signed)
   Subjective:    Patient ID: George Meyer, male    DOB: July 05, 1940, 80 y.o.   MRN: [id number]  HPI Pt here to follow up on weight loss. Pt has stopped smoking; quit about 2 months ago. Pt states he has picked up a few pounds.  Pt has poison ivy on body since Saturday.   Patient here for follow-up regarding cholesterol.  The patient does have hyperlipidemia.  Patient does try to maintain a reasonable diet.  Patient does take the medication on a regular basis.  Denies missing a dose.  The patient denies any obvious side effects.  Prior blood work results reviewed with the patient.  The patient is aware of his cholesterol goals and the need to keep it under good control to lessen the risk of disease.  Patient for blood pressure check up.  The patient does have hypertension.  The patient is on medication.  Patient relates compliance with meds. Todays BP reviewed with the patient. Patient denies issues with medication. Patient relates reasonable diet. Patient tries to minimize salt. Patient aware of BP goals.  Patient suffers with insomnia.  This is been going on for a while.  The patient finds it necessary to use medication to help sleep.  Patient finds it if not using medication has significant troubles.  Denies abusing the medication.  Denies any negative side effects.   Review of Systems     Objective:   Physical Exam  General-in no acute distress Eyes-no discharge Lungs-respiratory rate normal, CTA CV-no murmurs,RRR Extremities skin warm dry no edema Neuro grossly normal Behavior normal, alert Rash-poison ivy extensive      Assessment & Plan:  Hyperlipidemia-importance of diet, weight control, activity, compliance with medications discussed.  Recent labs reviewed.  Any additional labs or refills ordered.  Importance of keeping under good control discussed.  HTN- patient seen for follow-up regarding HTN.  Diet, medication compliance, appropriate labs and refills were completed.   Importance of keeping blood pressure under good control to lessen the risk of complications discussed  His weight is stable. He is working hard to eat well and stay active He quit smoking and his breathing is doing well Insomnia and moods are doing well on the Remeron I do not feel he needs any further for cancer at this point We will do a yearly CBC to monitor her leukocytosis Patient had colonoscopy which showed diverticula Blood pressure could be a little bit under better control increase the dose of lisinopril to 5 mg Follow-up by October or early November

## 2021-01-23 NOTE — Patient Instructions (Signed)
Clobetasol cream as well as prednisone was sent into [facility]  Increased dose of lisinopril 5 mg daily  Please follow-up in late October or early November  Follow-up sooner if any problems Thanks-Dr. Nicki Reaper

## 2021-02-07 DIAGNOSIS — L255 Unspecified contact dermatitis due to plants, except food: Secondary | ICD-10-CM | POA: Diagnosis not present

## 2021-02-23 ENCOUNTER — Other Ambulatory Visit: Payer: Self-pay | Admitting: Family Medicine

## 2021-03-09 DIAGNOSIS — L989 Disorder of the skin and subcutaneous tissue, unspecified: Secondary | ICD-10-CM | POA: Diagnosis not present

## 2021-03-09 DIAGNOSIS — D485 Neoplasm of uncertain behavior of skin: Secondary | ICD-10-CM | POA: Diagnosis not present

## 2021-03-09 DIAGNOSIS — D225 Melanocytic nevi of trunk: Secondary | ICD-10-CM | POA: Diagnosis not present

## 2021-03-09 DIAGNOSIS — X32XXXD Exposure to sunlight, subsequent encounter: Secondary | ICD-10-CM | POA: Diagnosis not present

## 2021-03-09 DIAGNOSIS — D2271 Melanocytic nevi of right lower limb, including hip: Secondary | ICD-10-CM | POA: Diagnosis not present

## 2021-03-09 DIAGNOSIS — Z1283 Encounter for screening for malignant neoplasm of skin: Secondary | ICD-10-CM | POA: Diagnosis not present

## 2021-03-09 DIAGNOSIS — L570 Actinic keratosis: Secondary | ICD-10-CM | POA: Diagnosis not present

## 2021-03-09 DIAGNOSIS — Z85820 Personal history of malignant melanoma of skin: Secondary | ICD-10-CM | POA: Diagnosis not present

## 2021-03-09 DIAGNOSIS — Z08 Encounter for follow-up examination after completed treatment for malignant neoplasm: Secondary | ICD-10-CM | POA: Diagnosis not present

## 2021-03-27 DIAGNOSIS — D485 Neoplasm of uncertain behavior of skin: Secondary | ICD-10-CM | POA: Diagnosis not present

## 2021-03-27 DIAGNOSIS — L98499 Non-pressure chronic ulcer of skin of other sites with unspecified severity: Secondary | ICD-10-CM | POA: Diagnosis not present

## 2021-03-27 DIAGNOSIS — L97219 Non-pressure chronic ulcer of right calf with unspecified severity: Secondary | ICD-10-CM | POA: Diagnosis not present

## 2021-03-27 DIAGNOSIS — L98429 Non-pressure chronic ulcer of back with unspecified severity: Secondary | ICD-10-CM | POA: Diagnosis not present

## 2021-04-05 ENCOUNTER — Other Ambulatory Visit: Payer: Self-pay

## 2021-04-05 ENCOUNTER — Encounter: Payer: Self-pay | Admitting: Gastroenterology

## 2021-04-05 ENCOUNTER — Ambulatory Visit: Payer: PPO | Admitting: Gastroenterology

## 2021-04-05 VITALS — BP 107/73 | HR 61 | Temp 97.1°F | Ht 68.0 in | Wt 125.6 lb

## 2021-04-05 DIAGNOSIS — R634 Abnormal weight loss: Secondary | ICD-10-CM | POA: Diagnosis not present

## 2021-04-05 DIAGNOSIS — K5900 Constipation, unspecified: Secondary | ICD-10-CM | POA: Diagnosis not present

## 2021-04-05 NOTE — Patient Instructions (Addendum)
Continue to monitor your weight and let us know if you start loosing weight again. Continue amitiza 8mg  once daily for constipation. Return to the office as needed.

## 2021-04-05 NOTE — Progress Notes (Signed)
Primary Care Physician: Kathyrn Drown, MD  Primary Gastroenterologist:  Garfield Cornea, MD   Chief Complaint  Patient presents with   Rectal Bleeding    F/u, doing better. No bleeding. Bowels ok. Appetite better    HPI: George Meyer is a 80 y.o. male here for follow-up of constipation, weight loss, early satiety. Symptoms started after Covid 19 pandemic, patient felt like isolation played a role. Work up since that time has included CT Chest/abd/pelvis, gastric emptying study, EGD and colonoscopy.   Weight has been stable over the past 4 months.  In March 2020 weight 143 pounds.  March 2021 he weighed 131 pounds.  At his lowest he was down to 114 pounds in June 2022.  Weighs 125 pounds today. Appetite much better. Appetite improved after he quit smoking. He stopped for 6 weeks and started back. Quit again two days ago. No heartburn. Does not take pantoprazole. BM regular. No melena, brbpr. No N/V. No abdominal pain. No dysphagia. Continues amitiza once daily. Also continues remeron.   Colonoscopy 11/2020: -Non-bleeding internal hemorrhoids. - Diverticulosis in the sigmoid colon and in the descending colon. - Four 3 to 5 mm polyps in the transverse colon and in the ascending colon, removed with a cold snare. Resected and retrieved. - The examined portion of the ileum was normal -tubular adenomas, no further surveillance colonoscopy due to age  EGD 09/17/2019 with esophageal web/noncritical Schatzki's ring not manipulated, small hiatal hernia, inflamed appearing stomach positive for H. pylori, normal examined duodenum.  He was treated with Prevpac x14 days.  H. pylori breath test negative on 11/03/2019.   Current Outpatient Medications  Medication Sig Dispense Refill   albuterol (PROVENTIL) (2.5 MG/3ML) 0.083% nebulizer solution USE 1 VIAL IN NEBULIZER EVERY 4 HOURS AS NEEDED FOR WHEEZING 300 mL 3   albuterol (VENTOLIN HFA) 108 (90 Base) MCG/ACT inhaler Inhale 2 puffs into the  lungs every 4 (four) hours as needed. 54 g 0   ALPRAZolam (XANAX) 0.5 MG tablet TAKE 1 TABLET BY MOUTH EVERY DAY AT BEDTIME AS NEEDED 30 tablet 2   aspirin EC 81 MG tablet Take 81 mg by mouth daily.     clobetasol cream (TEMOVATE) 0.05 % Apply thin amount bid prn not on face or groin 45 g 1   docusate sodium (COLACE) 100 MG capsule Take 100 mg by mouth 2 (two) times daily as needed for mild constipation.     dorzolamide-timolol (COSOPT) 22.3-6.8 MG/ML ophthalmic solution Place 1 drop into both eyes 2 (two) times daily. 10 mL 0   ergocalciferol (VITAMIN D2) 1.25 MG (50000 UT) capsule Take 1 capsule (50,000 Units total) by mouth once a week. 4 capsule 5   lisinopril (ZESTRIL) 5 MG tablet Take 0.5 tablets (2.5 mg total) by mouth daily. 90 tablet 1   lubiprostone (AMITIZA) 8 MCG capsule Take one capsule once to twice daily WITH FOOD for constipation. (Patient taking differently: Take 8 mcg by mouth 2 (two) times daily with a meal.) 60 capsule 3   mirtazapine (REMERON) 15 MG tablet Take 1 tablet (15 mg total) by mouth at bedtime. 30 tablet 5   nitroGLYCERIN (NITROSTAT) 0.4 MG SL tablet 1 sl q 5 min prn chest pressure as directed (Patient taking differently: Place 0.4 mg under the tongue every 5 (five) minutes as needed for chest pain.) 50 tablet 3   nystatin (MYCOSTATIN) 100000 UNIT/ML suspension Take 5 mLs (500,000 Units total) by mouth 4 (four) times daily. Swish and  swallow 180 mL 1   ondansetron (ZOFRAN ODT) 8 MG disintegrating tablet Take 1 tablet 3 x daily has needed (Patient taking differently: Take 8 mg by mouth 3 (three) times daily as needed for vomiting or nausea.) 15 tablet 2   pravastatin (PRAVACHOL) 40 MG tablet Take 1 tablet (40 mg total) by mouth at bedtime. 90 tablet 1   predniSONE (DELTASONE) 20 MG tablet Take 20-80 mg by mouth daily as needed (for respiratory issues).     Saw Palmetto 450 MG CAPS Take 450 mg by mouth in the morning and at bedtime.     budesonide-formoterol (SYMBICORT)  160-4.5 MCG/ACT inhaler INHALE TWO PUFFS BY MOUTH TWICE DAILY ** STOP SPIRIVA ** (Patient taking differently: Inhale 2 puffs into the lungs 2 (two) times daily.) 1 each 6   No current facility-administered medications for this visit.    Allergies as of 04/05/2021 - Review Complete 04/05/2021  Allergen Reaction Noted   Amlodipine Itching 12/21/2013   Chlorzoxazone Itching 03/18/2019    ROS:  General: Negative for anorexia, weight loss, fever, chills, fatigue, weakness. See hpi ENT: Negative for hoarseness, difficulty swallowing , nasal congestion. CV: Negative for chest pain, angina, palpitations, dyspnea on exertion, peripheral edema.  Respiratory: Negative for dyspnea at rest, dyspnea on exertion, cough, sputum, wheezing.  GI: See history of present illness. GU:  Negative for dysuria, hematuria, urinary incontinence, urinary frequency, nocturnal urination.  Endo: Negative for unusual weight change.    Physical Examination:   BP 107/73   Pulse 61   Temp (!) 97.1 F (36.2 C) (Temporal)   Ht 5\' 8"  (1.727 m)   Wt 125 lb 9.6 oz (57 kg)   BMI 19.10 kg/m   General: Well-nourished, well-developed in no acute distress.  Eyes: No icterus. Mouth: no masked Abdomen: Bowel sounds are normal, nontender, nondistended, no hepatosplenomegaly or masses, no abdominal bruits or hernia , no rebound or guarding.   Extremities: No lower extremity edema. No clubbing or deformities. Neuro: Alert and oriented x 4   Skin: Warm and dry, no jaundice.   Psych: Alert and cooperative, normal mood and affect.  Labs:  Lab Results  Component Value Date   CREATININE 0.69 01/03/2021   BUN 12 01/03/2021   NA 143 01/03/2021   K 3.9 01/03/2021   CL 105 01/03/2021   CO2 32 01/03/2021   Lab Results  Component Value Date   ALT 13 01/03/2021   AST 16 01/03/2021   ALKPHOS 49 01/03/2021   BILITOT 0.3 01/03/2021   Lab Results  Component Value Date   WBC 10.2 01/03/2021   HGB 15.7 01/03/2021   HCT  48.4 01/03/2021   MCV 98.0 01/03/2021   PLT 301 01/03/2021   Lab Results  Component Value Date   VITAMINB12 591 01/03/2021   Lab Results  Component Value Date   IRON 159 11/07/2020   TIBC 250 11/07/2020   FERRITIN 243 11/07/2020   No results found for: FOLATE   Imaging Studies: No results found.   Assessment:  Weight loss: Stable.  Appetite has improved and has gained over 10 pounds in the past 4 months.  He contributes this to quitting smoking as well.  Completed colonoscopy for history of heme positive stools as outlined.  Recent labs unremarkable.  Encouraged continued smoking cessation.  Encouraged well-rounded diet, eating multiple meals and snacks daily to maintain weight.  Constipation: Well-controlled on Amitiza   Plan: Continue Amitiza 8 mcg once or twice daily. Continue to monitor for further weight  loss. Return office visit as needed.

## 2021-04-25 ENCOUNTER — Other Ambulatory Visit: Payer: Self-pay

## 2021-04-25 ENCOUNTER — Encounter: Payer: Self-pay | Admitting: Family Medicine

## 2021-04-25 ENCOUNTER — Ambulatory Visit (INDEPENDENT_AMBULATORY_CARE_PROVIDER_SITE_OTHER): Payer: PPO | Admitting: Family Medicine

## 2021-04-25 VITALS — BP 150/78 | Temp 97.7°F | Wt 127.0 lb

## 2021-04-25 DIAGNOSIS — I10 Essential (primary) hypertension: Secondary | ICD-10-CM

## 2021-04-25 DIAGNOSIS — Z23 Encounter for immunization: Secondary | ICD-10-CM

## 2021-04-25 DIAGNOSIS — G250 Essential tremor: Secondary | ICD-10-CM | POA: Diagnosis not present

## 2021-04-25 MED ORDER — LISINOPRIL 5 MG PO TABS: 5.0000 mg | ORAL_TABLET | Freq: Every day | ORAL | 1 refills | Status: DC

## 2021-04-25 MED ORDER — BUDESONIDE-FORMOTEROL FUMARATE 160-4.5 MCG/ACT IN AERO: INHALATION_SPRAY | RESPIRATORY_TRACT | 6 refills | Status: DC

## 2021-04-25 MED ORDER — PRAVASTATIN SODIUM 40 MG PO TABS: 40.0000 mg | ORAL_TABLET | Freq: Every day | ORAL | 1 refills | Status: DC

## 2021-04-25 NOTE — Progress Notes (Signed)
     Subjective:    Patient ID: George Meyer, male    DOB: 07-Sep-1940, 80 y.o.   MRN: [id number]  HPI Pt here for follow up on HTN. Pt states he is not sure how is BP has been but has had no issues. Taking all meds as directed.   Need for vaccination - Plan: Flu Vaccine QUAD High Dose(Fluad)  Primary hypertension  Benign essential tremor  Patient relates has had some intermittent issues with tremor in the left hand which sometimes come and go.  No specific findings otherwise.  Denies any difficulty walking  Blood pressure slightly elevated today but given his age we need to be careful not to try to drastically bring it down.  Depression under good control.  Moods are doing well appetite doing well  Recently picked up smoking again patient strongly encouraged to quit Review of Systems     Objective:   Physical Exam General-in no acute distress Eyes-no discharge Lungs-respiratory rate normal, CTA CV-no murmurs,RRR Extremities skin warm dry no edema Neuro grossly normal Behavior normal, alert  Not orthostatic      Assessment & Plan:   1. Need for vaccination Flu shot today - Flu Vaccine QUAD High Dose(Fluad)  2. Primary hypertension Blood pressure higher than what I would like to see it increase lisinopril new dose 5 mg daily follow-up within 3 to 4 months follow-up sooner problems  3. Benign essential tremor No tremor noted but what he describes sounds like benign essential tremor that comes and goes in the left hand more so when he is trying to write.  No intervention necessary currently.  No sign of Parkinson's on today's exam negative cogwheeling walks normally  COPD stable patient encouraged to quit smoking.  Continue his Symbicort he states he will use his Symbicort through [facility].  Nystatin if necessary for any thrush rinse mouth after use

## 2021-05-18 DIAGNOSIS — Z08 Encounter for follow-up examination after completed treatment for malignant neoplasm: Secondary | ICD-10-CM | POA: Diagnosis not present

## 2021-05-18 DIAGNOSIS — L821 Other seborrheic keratosis: Secondary | ICD-10-CM | POA: Diagnosis not present

## 2021-05-18 DIAGNOSIS — L928 Other granulomatous disorders of the skin and subcutaneous tissue: Secondary | ICD-10-CM | POA: Diagnosis not present

## 2021-05-18 DIAGNOSIS — D225 Melanocytic nevi of trunk: Secondary | ICD-10-CM | POA: Diagnosis not present

## 2021-05-18 DIAGNOSIS — Z1283 Encounter for screening for malignant neoplasm of skin: Secondary | ICD-10-CM | POA: Diagnosis not present

## 2021-05-18 DIAGNOSIS — Z85828 Personal history of other malignant neoplasm of skin: Secondary | ICD-10-CM | POA: Diagnosis not present

## 2021-05-18 DIAGNOSIS — L820 Inflamed seborrheic keratosis: Secondary | ICD-10-CM | POA: Diagnosis not present

## 2021-05-22 ENCOUNTER — Other Ambulatory Visit: Payer: Self-pay | Admitting: Family Medicine

## 2021-06-29 ENCOUNTER — Other Ambulatory Visit: Payer: Self-pay | Admitting: Family Medicine

## 2021-07-07 ENCOUNTER — Other Ambulatory Visit: Payer: Self-pay | Admitting: Family Medicine

## 2021-07-07 ENCOUNTER — Telehealth: Payer: Self-pay | Admitting: Family Medicine

## 2021-07-07 NOTE — Telephone Encounter (Signed)
Pt called and stated he needed a written Rx for Symbacort and ventolin inhaler due to he gets his meds from [place] and has to fax it. I told him Dr Nicki Reaper was out and he state he has some meds but they are getting "skimpy" and it takes 3 weeks to be delivered. [phone]

## 2021-07-10 ENCOUNTER — Other Ambulatory Visit: Payer: Self-pay | Admitting: *Deleted

## 2021-07-10 ENCOUNTER — Other Ambulatory Visit: Payer: Self-pay | Admitting: Family Medicine

## 2021-07-10 DIAGNOSIS — F324 Major depressive disorder, single episode, in partial remission: Secondary | ICD-10-CM

## 2021-07-10 MED ORDER — ALPRAZOLAM 0.5 MG PO TABS: ORAL_TABLET | ORAL | 2 refills | Status: DC

## 2021-07-10 NOTE — Telephone Encounter (Addendum)
Patient stated he last filled his xanax in [place] and had 2 refills left on it but they will not transfer the refills back to [place] and he needs refill sent to [facility] in [place].

## 2021-07-14 ENCOUNTER — Other Ambulatory Visit: Payer: Self-pay

## 2021-07-14 ENCOUNTER — Encounter (INDEPENDENT_AMBULATORY_CARE_PROVIDER_SITE_OTHER): Payer: Self-pay

## 2021-07-14 ENCOUNTER — Ambulatory Visit (INDEPENDENT_AMBULATORY_CARE_PROVIDER_SITE_OTHER): Payer: PPO | Admitting: Family Medicine

## 2021-07-14 VITALS — BP 172/100 | HR 102 | Temp 98.4°F | Ht 68.0 in | Wt 127.0 lb

## 2021-07-14 DIAGNOSIS — J441 Chronic obstructive pulmonary disease with (acute) exacerbation: Secondary | ICD-10-CM | POA: Diagnosis not present

## 2021-07-14 DIAGNOSIS — F324 Major depressive disorder, single episode, in partial remission: Secondary | ICD-10-CM

## 2021-07-14 MED ORDER — AMOXICILLIN-POT CLAVULANATE 875-125 MG PO TABS: 1.0000 | ORAL_TABLET | Freq: Two times a day (BID) | ORAL | 0 refills | Status: DC

## 2021-07-14 MED ORDER — ALPRAZOLAM 0.5 MG PO TABS: ORAL_TABLET | ORAL | 0 refills | Status: DC

## 2021-07-14 MED ORDER — MIRTAZAPINE 15 MG PO TABS: 15.0000 mg | ORAL_TABLET | Freq: Every day | ORAL | 1 refills | Status: DC

## 2021-07-14 MED ORDER — PREDNISONE 50 MG PO TABS: ORAL_TABLET | ORAL | 0 refills | Status: DC

## 2021-07-14 NOTE — Assessment & Plan Note (Signed)
Patient experiencing an acute COPD exacerbation. Placing on prednisone and Augmentin.  Advised him to check his oxygen saturation regularly at home.  If he continues to be short of breath and does not improve or his oxygen saturation drops, he is to go directly to the ER.  Patient is in agreement.

## 2021-07-14 NOTE — Patient Instructions (Signed)
Prednisone and Antibiotic as prescribed.  Keep a close eye on your Oxygen levels.  If you worsen, go directly to the ER.

## 2021-07-14 NOTE — Progress Notes (Signed)
Subjective:  Patient ID: George Meyer, male    DOB: 07/09/1940  Age: 81 y.o. MRN: [id number]  CC: Chief Complaint  Patient presents with   Cough    Chest congestion 4 to5 days, wheezing , sob has been using his 2 inhalers and breathing tx at home has copd    HPI:  81 year old male with COPD presents with respiratory symptoms.  Patient reports a 4 to 5-day history of cough, wheezing, and increasing shortness of breath.  He has been using his home medication without relief.  Cough is productive.  No reported sick contacts.  No known inciting factor.  No fever.  No other associated symptoms.  No other complaints.  Patient Active Problem List   Diagnosis Date Noted   COPD exacerbation ([place]) 07/14/2021   Underweight 09/29/2020   Isolation (social) 09/29/2020   Centrilobular emphysema ([place]) 04/25/2020   Depression, major, single episode, in partial remission ([place]) 01/22/2020   History of colonic polyps 60/63/0160   History of Helicobacter pylori infection 10/26/2019   Cigarette smoker 10/21/2019   Dupuytrens contracture 02/02/2019   Dupuytren's contracture of left hand 01/08/2019   Depression, major, single episode, moderate ([place]) 12/18/2018   Aortic atherosclerosis ([place]) 12/30/2016   Insomnia 02/16/2016   Hyperlipidemia 04/18/2015   Solitary pulmonary nodule 11/09/2014   HTN (hypertension) 12/17/2013   Generalized anxiety disorder 12/27/2009   IBS 12/27/2009   HYPERTRIGLYCERIDEMIA 12/26/2009   COPD  Group D symptoms / risk 12/26/2009    Social Hx   Social History   Socioeconomic History   Marital status: Single    Spouse name: Not on file   Number of children: Not on file   Years of education: Not on file   Highest education level: Not on file  Occupational History   Not on file  Tobacco Use   Smoking status: Former    Packs/day: 1.00    Years: 66.00    Pack years: 66.00    Types: Cigarettes    Quit date: 11/09/2020    Years since quitting: 0.6   Smokeless  tobacco: Never   Tobacco comments:    started smoking age 64  Vaping Use   Vaping Use: Never used  Substance and Sexual Activity   Alcohol use: No   Drug use: No   Sexual activity: Yes    Partners: Female  Other Topics Concern   Not on file  Social History Narrative   Not on file   Social Determinants of Health   Financial Resource Strain: Not on file  Food Insecurity: Not on file  Transportation Needs: Not on file  Physical Activity: Not on file  Stress: Not on file  Social Connections: Not on file    Review of Systems Per HPI  Objective:  BP (!) 172/100    Pulse (!) 102    Temp 98.4 F (36.9 C)    Ht 5\' 8"  (1.727 m)    Wt 127 lb (57.6 kg)    SpO2 91%    BMI 19.31 kg/m   BP/Weight 07/14/2021 04/25/2021 04/09/3234  Systolic BP 573 220 254  Diastolic BP 270 78 73  Wt. (Lbs) 127 127 125.6  BMI 19.31 19.31 19.1    Physical Exam Constitutional:      Comments: Appears mildly dyspneic.  HENT:     Head: Normocephalic and atraumatic.  Eyes:     General:        Right eye: No discharge.        Left  eye: No discharge.     Conjunctiva/sclera: Conjunctivae normal.  Cardiovascular:     Rate and Rhythm: Regular rhythm. Tachycardia present.  Pulmonary:     Comments: Mild increased work of breathing.  Diffuse wheezing.  Coarse breath sounds throughout. Neurological:     Mental Status: He is alert.  Psychiatric:        Mood and Affect: Mood normal.        Behavior: Behavior normal.    Lab Results  Component Value Date   WBC 10.2 01/03/2021   HGB 15.7 01/03/2021   HCT 48.4 01/03/2021   PLT 301 01/03/2021   GLUCOSE 85 01/03/2021   CHOL 171 09/19/2020   TRIG 125 09/19/2020   HDL 51 09/19/2020   LDLCALC 98 09/19/2020   ALT 13 01/03/2021   AST 16 01/03/2021   NA 143 01/03/2021   K 3.9 01/03/2021   CL 105 01/03/2021   CREATININE 0.69 01/03/2021   BUN 12 01/03/2021   CO2 32 01/03/2021   TSH 2.390 11/07/2020   PSA 0.82 08/03/2013     Assessment & Plan:    Problem List Items Addressed This Visit       Respiratory   COPD exacerbation ([place]) - Primary    Patient experiencing an acute COPD exacerbation. Placing on prednisone and Augmentin.  Advised him to check his oxygen saturation regularly at home.  If he continues to be short of breath and does not improve or his oxygen saturation drops, he is to go directly to the ER.  Patient is in agreement.      Relevant Medications   predniSONE (DELTASONE) 50 MG tablet     Other   Depression, major, single episode, in partial remission (HCC)   Relevant Medications   ALPRAZolam (XANAX) 0.5 MG tablet   mirtazapine (REMERON) 15 MG tablet    Meds ordered this encounter  Medications   predniSONE (DELTASONE) 50 MG tablet    Sig: 1 tablet daily x 5 days    Dispense:  5 tablet    Refill:  0   amoxicillin-clavulanate (AUGMENTIN) 875-125 MG tablet    Sig: Take 1 tablet by mouth 2 (two) times daily.    Dispense:  14 tablet    Refill:  0   ALPRAZolam (XANAX) 0.5 MG tablet    Sig: TAKE 1 TABLET BY MOUTH EVERY DAY AT BEDTIME AS NEEDED    Dispense:  30 tablet    Refill:  0   mirtazapine (REMERON) 15 MG tablet    Sig: Take 1 tablet (15 mg total) by mouth at bedtime.    Dispense:  90 tablet    Refill:  1    Follow-up:  No follow-ups on file.  [place]

## 2021-07-21 ENCOUNTER — Other Ambulatory Visit (HOSPITAL_COMMUNITY)
Admission: RE | Admit: 2021-07-21 | Discharge: 2021-07-21 | Disposition: A | Discharge status: Home / Self Care | Payer: PPO | Source: Ambulatory Visit | Attending: Family Medicine | Admitting: Family Medicine

## 2021-07-21 ENCOUNTER — Ambulatory Visit (INDEPENDENT_AMBULATORY_CARE_PROVIDER_SITE_OTHER): Payer: PPO | Admitting: Family Medicine

## 2021-07-21 ENCOUNTER — Encounter: Payer: Self-pay | Admitting: Family Medicine

## 2021-07-21 ENCOUNTER — Ambulatory Visit (HOSPITAL_COMMUNITY)
Admission: RE | Admit: 2021-07-21 | Discharge: 2021-07-21 | Disposition: A | Discharge status: Home / Self Care | Payer: PPO | Source: Ambulatory Visit | Attending: Family Medicine | Admitting: Family Medicine

## 2021-07-21 ENCOUNTER — Other Ambulatory Visit: Payer: Self-pay

## 2021-07-21 VITALS — BP 121/73 | HR 95 | Temp 97.2°F | Ht 68.0 in | Wt 124.0 lb

## 2021-07-21 DIAGNOSIS — J441 Chronic obstructive pulmonary disease with (acute) exacerbation: Secondary | ICD-10-CM | POA: Diagnosis not present

## 2021-07-21 DIAGNOSIS — R0602 Shortness of breath: Secondary | ICD-10-CM | POA: Insufficient documentation

## 2021-07-21 LAB — CBC WITH DIFFERENTIAL/PLATELET
Abs Immature Granulocytes: 0.2 10*3/uL — ABNORMAL HIGH (ref 0.00–0.07)
Basophils Absolute: 0 10*3/uL (ref 0.0–0.1)
Basophils Relative: 0 %
Eosinophils Absolute: 0.5 10*3/uL (ref 0.0–0.5)
Eosinophils Relative: 3 %
HCT: 48.3 % (ref 39.0–52.0)
Hemoglobin: 16.2 g/dL (ref 13.0–17.0)
Immature Granulocytes: 1 %
Lymphocytes Relative: 16 %
Lymphs Abs: 2.3 10*3/uL (ref 0.7–4.0)
MCH: 32.9 pg (ref 26.0–34.0)
MCHC: 33.5 g/dL (ref 30.0–36.0)
MCV: 98 fL (ref 80.0–100.0)
Monocytes Absolute: 1 10*3/uL (ref 0.1–1.0)
Monocytes Relative: 7 %
Neutro Abs: 10.3 10*3/uL — ABNORMAL HIGH (ref 1.7–7.7)
Neutrophils Relative %: 73 %
Platelets: 372 10*3/uL (ref 150–400)
RBC: 4.93 MIL/uL (ref 4.22–5.81)
RDW: 14.8 % (ref 11.5–15.5)
WBC: 14.3 10*3/uL — ABNORMAL HIGH (ref 4.0–10.5)
nRBC: 0 % (ref 0.0–0.2)

## 2021-07-21 LAB — COMPREHENSIVE METABOLIC PANEL
ALT: 15 U/L (ref 0–44)
AST: 13 U/L — ABNORMAL LOW (ref 15–41)
Albumin: 3.6 g/dL (ref 3.5–5.0)
Alkaline Phosphatase: 50 U/L (ref 38–126)
Anion gap: 7 (ref 5–15)
BUN: 15 mg/dL (ref 8–23)
CO2: 34 mmol/L — ABNORMAL HIGH (ref 22–32)
Calcium: 8.8 mg/dL — ABNORMAL LOW (ref 8.9–10.3)
Chloride: 101 mmol/L (ref 98–111)
Creatinine, Ser: 0.84 mg/dL (ref 0.61–1.24)
GFR, Estimated: >60 mL/min (ref >60)
Glucose, Bld: 167 mg/dL — ABNORMAL HIGH (ref 70–99)
Potassium: 3.8 mmol/L (ref 3.5–5.1)
Sodium: 142 mmol/L (ref 135–145)
Total Bilirubin: 0.6 mg/dL (ref 0.3–1.2)
Total Protein: 6.1 g/dL — ABNORMAL LOW (ref 6.5–8.1)

## 2021-07-21 MED ORDER — PREDNISONE 10 MG PO TABS: ORAL_TABLET | ORAL | 0 refills | Status: DC

## 2021-07-21 MED ORDER — LEVOFLOXACIN 500 MG PO TABS: 500.0000 mg | ORAL_TABLET | Freq: Every day | ORAL | 0 refills | Status: DC

## 2021-07-21 NOTE — Progress Notes (Signed)
Subjective:  Patient ID: George Meyer, male    DOB: 25-Nov-1940  Age: 81 y.o. MRN: [id number]  CC: Chief Complaint  Patient presents with   Cough   chest congestion    copd    HPI:  81 year old male presents with the above complaints.  Patient recently seen by me for COPD exacerbation.  Was treated with Augmentin and prednisone.  Patient continues to feel poorly.  He feels weak.  Continues to have severe cough, chest congestion, and worsening shortness of breath.  He endorses compliance with his home medication/inhalers.  He is not improving.  No fever.  No other associated symptoms.  No other complaints.  Patient Active Problem List   Diagnosis Date Noted   Underweight 09/29/2020   Isolation (social) 09/29/2020   Centrilobular emphysema ([place]) 04/25/2020   Depression, major, single episode, in partial remission ([place]) 01/22/2020   History of colonic polyps 95/28/4132   History of Helicobacter pylori infection 10/26/2019   Cigarette smoker 10/21/2019   Dupuytrens contracture 02/02/2019   Dupuytren's contracture of left hand 01/08/2019   Depression, major, single episode, moderate ([place]) 12/18/2018   Aortic atherosclerosis ([place]) 12/30/2016   COPD exacerbation ([place]) 07/04/2016   Insomnia 02/16/2016   Hyperlipidemia 04/18/2015   Solitary pulmonary nodule 11/09/2014   HTN (hypertension) 12/17/2013   Generalized anxiety disorder 12/27/2009   IBS 12/27/2009   HYPERTRIGLYCERIDEMIA 12/26/2009   COPD  Group D symptoms / risk 12/26/2009    Social Hx   Social History   Socioeconomic History   Marital status: Single    Spouse name: Not on file   Number of children: Not on file   Years of education: Not on file   Highest education level: Not on file  Occupational History   Not on file  Tobacco Use   Smoking status: Former    Packs/day: 1.00    Years: 66.00    Pack years: 66.00    Types: Cigarettes    Quit date: 11/09/2020    Years since quitting: 0.6   Smokeless  tobacco: Never   Tobacco comments:    started smoking age 7  Vaping Use   Vaping Use: Never used  Substance and Sexual Activity   Alcohol use: No   Drug use: No   Sexual activity: Yes    Partners: Female  Other Topics Concern   Not on file  Social History Narrative   Not on file   Social Determinants of Health   Financial Resource Strain: Not on file  Food Insecurity: Not on file  Transportation Needs: Not on file  Physical Activity: Not on file  Stress: Not on file  Social Connections: Not on file    Review of Systems Per HPI  Objective:  BP 121/73    Pulse 95    Temp (!) 97.2 F (36.2 C)    Ht 5\' 8"  (1.727 m)    Wt 124 lb (56.2 kg)    SpO2 93%    BMI 18.85 kg/m   BP/Weight 07/21/2021 07/14/2021 44/07/270  Systolic BP 536 644 034  Diastolic BP 73 742 78  Wt. (Lbs) 124 127 127  BMI 18.85 19.31 19.31    Physical Exam Vitals and nursing note reviewed.  Constitutional:      General: He is not in acute distress. HENT:     Head: Normocephalic and atraumatic.  Eyes:     General:        Right eye: No discharge.  Left eye: No discharge.     Conjunctiva/sclera: Conjunctivae normal.  Cardiovascular:     Rate and Rhythm: Normal rate and regular rhythm.  Pulmonary:     Effort: Pulmonary effort is normal.     Breath sounds: Wheezing and rhonchi present.  Neurological:     Mental Status: He is alert.    Lab Results  Component Value Date   WBC 14.3 (H) 07/21/2021   HGB 16.2 07/21/2021   HCT 48.3 07/21/2021   PLT 372 07/21/2021   GLUCOSE 167 (H) 07/21/2021   CHOL 171 09/19/2020   TRIG 125 09/19/2020   HDL 51 09/19/2020   LDLCALC 98 09/19/2020   ALT 15 07/21/2021   AST 13 (L) 07/21/2021   NA 142 07/21/2021   K 3.8 07/21/2021   CL 101 07/21/2021   CREATININE 0.84 07/21/2021   BUN 15 07/21/2021   CO2 34 (H) 07/21/2021   TSH 2.390 11/07/2020   PSA 0.82 08/03/2013     Assessment & Plan:   Problem List Items Addressed This Visit        Respiratory   COPD exacerbation ([place]) - Primary    Worsening.  Persistent symptoms and lack of improvement despite recent treatment.  Chest x-ray was obtained today was independent reviewed by me.  Emphysematous changes.  No acute infiltrate.  Labs stable.  Treating with Levaquin and prednisone.      Relevant Medications   predniSONE (DELTASONE) 10 MG tablet   Other Visit Diagnoses     SOB (shortness of breath)       Relevant Orders   CBC with Differential   Comprehensive metabolic panel   DG Chest 2 View (Completed)       Meds ordered this encounter  Medications   levofloxacin (LEVAQUIN) 500 MG tablet    Sig: Take 1 tablet (500 mg total) by mouth daily.    Dispense:  7 tablet    Refill:  0   predniSONE (DELTASONE) 10 MG tablet    Sig: 60 mg daily x 2 days, 50 mg daily x 2 days, then 40 mg daily x 2 days, then 30 mg daily x 2 days, then 20 mg daily x 2 days, then 10 mg daily x 2 days.    Dispense:  42 tablet    Refill:  [place]

## 2021-07-21 NOTE — Patient Instructions (Signed)
Labs and xray at the hospital.  I will call after they return and we will discuss the plan of action.  If you worsen, go directly to the ER.

## 2021-07-21 NOTE — Assessment & Plan Note (Signed)
Worsening.  Persistent symptoms and lack of improvement despite recent treatment.  Chest x-ray was obtained today was independent reviewed by me.  Emphysematous changes.  No acute infiltrate.  Labs stable.  Treating with Levaquin and prednisone.

## 2021-07-28 ENCOUNTER — Other Ambulatory Visit: Payer: Self-pay

## 2021-07-28 ENCOUNTER — Ambulatory Visit (INDEPENDENT_AMBULATORY_CARE_PROVIDER_SITE_OTHER): Payer: PPO | Admitting: Family Medicine

## 2021-07-28 VITALS — BP 159/102 | HR 99 | Temp 97.7°F | Ht 68.0 in | Wt 125.8 lb

## 2021-07-28 DIAGNOSIS — J441 Chronic obstructive pulmonary disease with (acute) exacerbation: Secondary | ICD-10-CM

## 2021-07-28 MED ORDER — METHYLPREDNISOLONE ACETATE 40 MG/ML IJ SUSP
40.0000 mg | Freq: Once | INTRAMUSCULAR | Status: AC
  Administered 2021-07-28: 40 mg via INTRAMUSCULAR

## 2021-07-28 NOTE — Patient Instructions (Signed)
Please utilize your nebulizer albuterol every 4-6 hours daily.  That would mean that you are utilizing it 4-6 times per day.  Continue your current medications Finish out the prednisone We will do a prednisone shot today called Depo-Medrol  I would like to see you next Thursday at 11 AM Call us sooner if any problems If anything emergent go to the ER  Try to do the best you can at eating small amounts frequently  We will see you next week-[facility]Care-Dr. Nicki Reaper

## 2021-07-28 NOTE — Progress Notes (Signed)
° °  Subjective:    Patient ID: George Meyer, male    DOB: August 18, 1940, 81 y.o.   MRN: [id number]  HPI Patient says he is having a hard time breathing, fatigue and real weak. Patient has been seen within the last 2 weeks for COPD exacerbation. He says he is taking prednisone and he can't sleep. Significant COPD issues Significant problems with progressive shortness of breath Has had couple rounds of antibiotics and steroids Currently still taking steroid taper   Review of Systems     Objective:   Physical Exam  Lungs clear bilateral heart regular pulse normal extremities no edema      Assessment & Plan:   We will recheck him next Thursday at 11:00 Depo-Medrol shot If he gets worse follow-up here or go to ER No further antibiotics needed

## 2021-08-03 ENCOUNTER — Ambulatory Visit (INDEPENDENT_AMBULATORY_CARE_PROVIDER_SITE_OTHER): Payer: PPO | Admitting: Family Medicine

## 2021-08-03 ENCOUNTER — Other Ambulatory Visit: Payer: Self-pay

## 2021-08-03 ENCOUNTER — Encounter: Payer: Self-pay | Admitting: Family Medicine

## 2021-08-03 VITALS — BP 132/76 | Temp 97.1°F | Wt 122.6 lb

## 2021-08-03 DIAGNOSIS — J449 Chronic obstructive pulmonary disease, unspecified: Secondary | ICD-10-CM | POA: Diagnosis not present

## 2021-08-03 MED ORDER — PRAVASTATIN SODIUM 40 MG PO TABS: 40.0000 mg | ORAL_TABLET | Freq: Every day | ORAL | 1 refills | Status: DC

## 2021-08-03 MED ORDER — ALPRAZOLAM 0.5 MG PO TABS: ORAL_TABLET | ORAL | 5 refills | Status: DC

## 2021-08-03 MED ORDER — LISINOPRIL 5 MG PO TABS: 5.0000 mg | ORAL_TABLET | Freq: Every day | ORAL | 1 refills | Status: DC

## 2021-08-03 NOTE — Progress Notes (Signed)
° °  Subjective:    Patient ID: George Meyer, male    DOB: Mar 01, 1941, 81 y.o.   MRN: [id number]  HPI Pt here for follow up on recent COPD exacerbation. Pt states that he is feeling better but is weak.  He states O2 sat 93% at home Heart rate staying in the 90s Finds himself feeling very weak when he moves around   Review of Systems     Objective:   Physical Exam Bilateral expiratory wheezes not severe worse on the right than the left actually doing much better compared to where he was no respiratory distress  Refills of medication were sent in Does utilizes Xanax at night to help him with sleep    Assessment & Plan:  COPD Severe Doing well with his treatments Gradually gaining strength Healthy diet recommended Follow-up if worsening illness otherwise we will recheck him in 5 to 6 weeks

## 2021-08-14 ENCOUNTER — Telehealth: Payer: Self-pay | Admitting: Family Medicine

## 2021-08-14 DIAGNOSIS — E782 Mixed hyperlipidemia: Secondary | ICD-10-CM

## 2021-08-14 NOTE — Telephone Encounter (Signed)
Pravastatin 40 mg is fine may stay at that dose As for the lisinopril 5 mg for now will stay on this dose but bring bottle with him when he comes later this month. Patient needs to do lipid profile before visit

## 2021-08-14 NOTE — Telephone Encounter (Signed)
Pharmacy states they filled Lisinopril 5mg  and Pravastatin 40 mg which were the scripts sent in 08/03/21- please advise

## 2021-08-14 NOTE — Telephone Encounter (Signed)
Blood work ordered in [facility]. Patient notified and verbalized understanding.

## 2021-08-14 NOTE — Telephone Encounter (Signed)
Pt called and stated that on his last visit, 08/03/21, Dr Nicki Reaper increased the dosage of his Lisinoprol and Provastatin. He states [facility] refilled the old dosage.   [phone]

## 2021-08-21 ENCOUNTER — Other Ambulatory Visit: Payer: Self-pay

## 2021-08-21 MED ORDER — ALPRAZOLAM 0.5 MG PO TABS: ORAL_TABLET | ORAL | 5 refills | Status: DC

## 2021-08-21 NOTE — Telephone Encounter (Signed)
Pt called in wanting to know if he could get his refill of ALPRAZolam (XANAX) 0.5 MG tablet sent in a little earlier. Pt states that he will be going out of town when it's time to get this refilled, and doesn't want to have it refilled while on trip . Please advise.   Cb#: [phone]

## 2021-08-21 NOTE — Telephone Encounter (Signed)
Enck you Out of curiosity when is he going out of town so we will know when to asked the pharmacy to have it ready for him thank you

## 2021-08-22 LAB — LIPID PANEL
Chol/HDL Ratio: 2.7 ratio (ref 0.0–5.0)
Cholesterol, Total: 145 mg/dL (ref 100–199)
HDL: 54 mg/dL (ref >39)
LDL Chol Calc (NIH): 70 mg/dL (ref 0–99)
Triglycerides: 119 mg/dL (ref 0–149)
VLDL Cholesterol Cal: 21 mg/dL (ref 5–40)

## 2021-08-25 ENCOUNTER — Ambulatory Visit (INDEPENDENT_AMBULATORY_CARE_PROVIDER_SITE_OTHER): Payer: PPO | Admitting: Family Medicine

## 2021-08-25 ENCOUNTER — Telehealth: Payer: Self-pay | Admitting: Family Medicine

## 2021-08-25 ENCOUNTER — Other Ambulatory Visit: Payer: Self-pay

## 2021-08-25 ENCOUNTER — Encounter: Payer: Self-pay | Admitting: Family Medicine

## 2021-08-25 VITALS — BP 132/81 | HR 83 | Temp 97.4°F | Wt 122.0 lb

## 2021-08-25 DIAGNOSIS — R000 Tachycardia, unspecified: Secondary | ICD-10-CM

## 2021-08-25 DIAGNOSIS — J441 Chronic obstructive pulmonary disease with (acute) exacerbation: Secondary | ICD-10-CM

## 2021-08-25 DIAGNOSIS — I10 Essential (primary) hypertension: Secondary | ICD-10-CM | POA: Diagnosis not present

## 2021-08-25 MED ORDER — CEFPROZIL 500 MG PO TABS: 500.0000 mg | ORAL_TABLET | Freq: Two times a day (BID) | ORAL | 0 refills | Status: DC

## 2021-08-25 MED ORDER — PREDNISONE 20 MG PO TABS: ORAL_TABLET | ORAL | 0 refills | Status: DC

## 2021-08-25 MED ORDER — CEFDINIR 300 MG PO CAPS: 300.0000 mg | ORAL_CAPSULE | Freq: Two times a day (BID) | ORAL | 0 refills | Status: DC

## 2021-08-25 NOTE — Telephone Encounter (Signed)
[facility] called into office and states that they are unable to get Cefdinir in at this time; can we change to different med? Please advise. Thank you (Pharmacy states we can send it electronically and they will call the patient when ready)

## 2021-08-25 NOTE — Telephone Encounter (Signed)
Cefzil was sent electronically thank you-FYI

## 2021-08-25 NOTE — Progress Notes (Signed)
° °  Subjective:    Patient ID: RAUNAK ANTUNA, male    DOB: 06-Aug-1940, 81 y.o.   MRN: [id number]  HPI Pt here to follow up on HTN and COPD. Pt states things have been going well. Taking all meds as directed.  Pulse has been 166 this past week.  Patient states intermittently he has had some fast heart rates but he denies any chest tightness pressure pain He does have mild DOE associated with his COPD Also is coughing up phlegm and congestion some wheezing  Review of Systems     Objective:   Physical Exam General-in no acute distress Eyes-no discharge Lungs-respiratory rate normal, faint wheezes are noted bilateral no crackles CV-no murmurs,RRR Extremities skin warm dry no edema Neuro grossly normal Behavior normal, alert        Assessment & Plan:  1. Primary hypertension Blood pressure decent control continue current measures  2. COPD exacerbation (HCC) Antibiotics and prednisone prescribed has inhalers follow-up in a couple weeks as planned for regular checkup  3. Tachycardia Today I do not detect any arrhythmia issues EKG looks good patient was educated what to watch for I do not find any evidence of atrial fib with rapid ventricular response currently follow-up if any problems - EKG 12-Lead

## 2021-09-07 ENCOUNTER — Other Ambulatory Visit: Payer: Self-pay

## 2021-09-07 ENCOUNTER — Ambulatory Visit (INDEPENDENT_AMBULATORY_CARE_PROVIDER_SITE_OTHER): Payer: PPO | Admitting: Family Medicine

## 2021-09-07 VITALS — BP 123/79 | HR 86 | Temp 97.9°F | Ht 68.0 in | Wt 121.4 lb

## 2021-09-07 DIAGNOSIS — I700 Atherosclerosis of aorta: Secondary | ICD-10-CM | POA: Diagnosis not present

## 2021-09-07 DIAGNOSIS — J441 Chronic obstructive pulmonary disease with (acute) exacerbation: Secondary | ICD-10-CM | POA: Diagnosis not present

## 2021-09-07 MED ORDER — PREDNISONE 20 MG PO TABS: ORAL_TABLET | ORAL | 0 refills | Status: DC

## 2021-09-07 MED ORDER — CEFPROZIL 500 MG PO TABS: 500.0000 mg | ORAL_TABLET | Freq: Two times a day (BID) | ORAL | 0 refills | Status: DC

## 2021-09-07 NOTE — Progress Notes (Signed)
? ?  Subjective:  ? ? Patient ID: George Meyer, male    DOB: 08-15-1940, 81 y.o.   MRN: [id number] ? ?HPI ? ?Patient says no having any cough and no trouble breathing.  He completed all of the antibiotics and prednisone. ?Patient relates his breathing is doing better ?O2 saturations are in the mid 90s ?Denies any wheezing or difficulty breathing uses his Symbicort as well as his albuterol regular basis ?Moods are doing well ?He is flying to [place] coming up this evening and going on a cruise ?He does have a tendency to getting COPD exacerbations but not having any currently ?Eating okay maintaining weight ?Review of Systems ? ?   ?Objective:  ? Physical Exam ?Lungs are clear for the most part with minimal wheezing hearts regular HEENT benign ? ?No respiratory distress ? ?   ?Assessment & Plan:  ?COPD exacerbation ?Prednisone antibiotics prescribed just in case he does have a flareup ?Not having a flareup currently ?Patient is at risk of having troubles ?Has COPD ?Has difficult time maintaining weight because of COPD ?History of aortic atherosclerosis is on statins tries to watch his diet ? ?

## 2021-09-07 NOTE — Progress Notes (Signed)
? ?  Subjective:  ? ? Patient ID: George Meyer, male    DOB: Jan 09, 1941, 81 y.o.   MRN: [id number] ? ?COPD ?He complains of cough. There is no shortness of breath or wheezing. Pertinent negatives include no chest pain or fever. His past medical history is significant for COPD.  ?Patient present for a follow up HTN and COPD. Report  overall doing well. Taking all meds as directed.  ?Noticed bilateral ankle edema since completing prednisone.  ?Defers SOB, chest tightness and continuous coughing.  ?Makes self cough to clear up throat.  ?He reports mild wheezing in the last week associated with his COPD and neb treatment and current medications helpful.  ?Traveling tonight to [place] to meet daughter to go on a cruise. ? ? ?Review of Systems  ?Constitutional:  Negative for chills, fatigue and fever.  ?Respiratory:  Positive for cough. Negative for chest tightness, shortness of breath and wheezing.   ?     Occasional cough with some sputum production.   ?Cardiovascular:  Positive for leg swelling. Negative for chest pain and palpitations.  ?Neurological:  Negative for dizziness and weakness.  ?Psychiatric/Behavioral:  Negative for behavioral problems and sleep disturbance.   ? ?   ?Objective:  ? Physical Exam ?Constitutional:   ?   Appearance: Normal appearance.  ?Cardiovascular:  ?   Rate and Rhythm: Normal rate and regular rhythm.  ?   Pulses: Normal pulses.  ?   Heart sounds: Normal heart sounds.  ?Pulmonary:  ?   Effort: No respiratory distress.  ?   Breath sounds: Wheezing present.  ?   Comments: Mild wheezing.  ?Neurological:  ?   General: No focal deficit present.  ?   Mental Status: He is alert and oriented to person, place, and time. Mental status is at baseline.  ?Psychiatric:     ?   Mood and Affect: Mood normal.     ?   Behavior: Behavior normal.     ?   Thought Content: Thought content normal.     ?   Judgment: Judgment normal.  ? ? ?Today's Vitals  ? 09/07/21 0903  ?BP: 123/79  ?Pulse: 86  ?Temp: 97.9 ?F  (36.6 ?C)  ?TempSrc: Temporal  ?SpO2: 94%  ?Weight: 121 lb 6.4 oz (55.1 kg)  ?Height: '5\' 8"'$  (1.727 m)  ? ?Body mass index is 18.46 kg/m?.  ? ?  ?Assessment & Plan:  ?General safety tips discussed for travel. ?Use below medications only for mild COPD Exacerbation whiles on cruise. ?Medical problem related to COPD.  ? ?Meds ordered this encounter  ?Medications  ? predniSONE (DELTASONE) 20 MG tablet  ?  Sig: 2 qd for 5d  ?  Dispense:  10 tablet  ?  Refill:  0  ? cefPROZIL (CEFZIL) 500 MG tablet  ?  Sig: Take 1 tablet (500 mg total) by mouth 2 (two) times daily.  ?  Dispense:  14 tablet  ?  Refill:  0  ?  ?Return mid may 2023.  ?

## 2021-09-21 ENCOUNTER — Other Ambulatory Visit: Payer: Self-pay | Admitting: *Deleted

## 2021-09-21 MED ORDER — ALBUTEROL SULFATE (2.5 MG/3ML) 0.083% IN NEBU: INHALATION_SOLUTION | RESPIRATORY_TRACT | 3 refills | Status: DC

## 2021-09-22 ENCOUNTER — Emergency Department (HOSPITAL_COMMUNITY): Payer: PPO

## 2021-09-22 ENCOUNTER — Encounter (HOSPITAL_COMMUNITY): Payer: Self-pay

## 2021-09-22 ENCOUNTER — Other Ambulatory Visit: Payer: Self-pay

## 2021-09-22 ENCOUNTER — Observation Stay (HOSPITAL_COMMUNITY)
Admission: EM | Admit: 2021-09-22 | Discharge: 2021-09-23 | Disposition: A | Discharge status: Home / Self Care | Payer: PPO | Attending: Internal Medicine | Admitting: Internal Medicine

## 2021-09-22 DIAGNOSIS — Z85828 Personal history of other malignant neoplasm of skin: Secondary | ICD-10-CM | POA: Diagnosis not present

## 2021-09-22 DIAGNOSIS — Z7982 Long term (current) use of aspirin: Secondary | ICD-10-CM | POA: Insufficient documentation

## 2021-09-22 DIAGNOSIS — F411 Generalized anxiety disorder: Secondary | ICD-10-CM | POA: Diagnosis present

## 2021-09-22 DIAGNOSIS — Z87891 Personal history of nicotine dependence: Secondary | ICD-10-CM | POA: Diagnosis not present

## 2021-09-22 DIAGNOSIS — Z79899 Other long term (current) drug therapy: Secondary | ICD-10-CM | POA: Diagnosis not present

## 2021-09-22 DIAGNOSIS — R7303 Prediabetes: Secondary | ICD-10-CM | POA: Diagnosis not present

## 2021-09-22 DIAGNOSIS — I10 Essential (primary) hypertension: Secondary | ICD-10-CM | POA: Diagnosis not present

## 2021-09-22 DIAGNOSIS — J441 Chronic obstructive pulmonary disease with (acute) exacerbation: Principal | ICD-10-CM | POA: Diagnosis present

## 2021-09-22 DIAGNOSIS — D72829 Elevated white blood cell count, unspecified: Secondary | ICD-10-CM | POA: Diagnosis not present

## 2021-09-22 DIAGNOSIS — R0602 Shortness of breath: Secondary | ICD-10-CM | POA: Diagnosis present

## 2021-09-22 DIAGNOSIS — E785 Hyperlipidemia, unspecified: Secondary | ICD-10-CM | POA: Diagnosis present

## 2021-09-22 DIAGNOSIS — Z20822 Contact with and (suspected) exposure to covid-19: Secondary | ICD-10-CM | POA: Insufficient documentation

## 2021-09-22 DIAGNOSIS — F17210 Nicotine dependence, cigarettes, uncomplicated: Secondary | ICD-10-CM | POA: Diagnosis present

## 2021-09-22 LAB — CBC WITH DIFFERENTIAL/PLATELET
Abs Immature Granulocytes: 0.36 10*3/uL — ABNORMAL HIGH (ref 0.00–0.07)
Basophils Absolute: 0.1 10*3/uL (ref 0.0–0.1)
Basophils Relative: 1 %
Eosinophils Absolute: 0.1 10*3/uL (ref 0.0–0.5)
Eosinophils Relative: 1 %
HCT: 48.8 % (ref 39.0–52.0)
Hemoglobin: 15.4 g/dL (ref 13.0–17.0)
Immature Granulocytes: 2 %
Lymphocytes Relative: 8 %
Lymphs Abs: 1.4 10*3/uL (ref 0.7–4.0)
MCH: 32.9 pg (ref 26.0–34.0)
MCHC: 31.6 g/dL (ref 30.0–36.0)
MCV: 104.3 fL — ABNORMAL HIGH (ref 80.0–100.0)
Monocytes Absolute: 1.3 10*3/uL — ABNORMAL HIGH (ref 0.1–1.0)
Monocytes Relative: 7 %
Neutro Abs: 13.7 10*3/uL — ABNORMAL HIGH (ref 1.7–7.7)
Neutrophils Relative %: 81 %
Platelets: 433 10*3/uL — ABNORMAL HIGH (ref 150–400)
RBC: 4.68 MIL/uL (ref 4.22–5.81)
RDW: 15.4 % (ref 11.5–15.5)
WBC: 16.9 10*3/uL — ABNORMAL HIGH (ref 4.0–10.5)
nRBC: 0 % (ref 0.0–0.2)

## 2021-09-22 LAB — COMPREHENSIVE METABOLIC PANEL
ALT: 21 U/L (ref 0–44)
AST: 17 U/L (ref 15–41)
Albumin: 4.2 g/dL (ref 3.5–5.0)
Alkaline Phosphatase: 68 U/L (ref 38–126)
Anion gap: 10 (ref 5–15)
BUN: 13 mg/dL (ref 8–23)
CO2: 38 mmol/L — ABNORMAL HIGH (ref 22–32)
Calcium: 8.7 mg/dL — ABNORMAL LOW (ref 8.9–10.3)
Chloride: 94 mmol/L — ABNORMAL LOW (ref 98–111)
Creatinine, Ser: 0.67 mg/dL (ref 0.61–1.24)
GFR, Estimated: >60 mL/min (ref >60)
Glucose, Bld: 158 mg/dL — ABNORMAL HIGH (ref 70–99)
Potassium: 3.8 mmol/L (ref 3.5–5.1)
Sodium: 142 mmol/L (ref 135–145)
Total Bilirubin: 0.5 mg/dL (ref 0.3–1.2)
Total Protein: 7.3 g/dL (ref 6.5–8.1)

## 2021-09-22 LAB — RESP PANEL BY RT-PCR (FLU A&B, COVID) ARPGX2
Influenza A by PCR: NEGATIVE
Influenza B by PCR: NEGATIVE
SARS Coronavirus 2 by RT PCR: NEGATIVE

## 2021-09-22 LAB — PROCALCITONIN: Procalcitonin: <0.1 ng/mL

## 2021-09-22 MED ORDER — SODIUM CHLORIDE 0.9% FLUSH
3.0000 mL | Freq: Two times a day (BID) | INTRAVENOUS | Status: DC
  Administered 2021-09-22 – 2021-09-23 (×3): 3 mL via INTRAVENOUS

## 2021-09-22 MED ORDER — IPRATROPIUM-ALBUTEROL 0.5-2.5 (3) MG/3ML IN SOLN
3.0000 mL | Freq: Four times a day (QID) | RESPIRATORY_TRACT | Status: DC
Start: 2021-09-22 — End: 2021-09-23
  Administered 2021-09-22 – 2021-09-23 (×3): 3 mL via RESPIRATORY_TRACT
  Filled 2021-09-22 (×4): qty 3

## 2021-09-22 MED ORDER — LISINOPRIL 5 MG PO TABS
5.0000 mg | ORAL_TABLET | Freq: Every day | ORAL | Status: DC
  Administered 2021-09-22: 5 mg via ORAL
  Filled 2021-09-22: qty 1

## 2021-09-22 MED ORDER — METHYLPREDNISOLONE SODIUM SUCC 40 MG IJ SOLR
40.0000 mg | Freq: Two times a day (BID) | INTRAMUSCULAR | Status: DC
  Administered 2021-09-22 – 2021-09-23 (×2): 40 mg via INTRAVENOUS
  Filled 2021-09-22 (×2): qty 1

## 2021-09-22 MED ORDER — IPRATROPIUM-ALBUTEROL 0.5-2.5 (3) MG/3ML IN SOLN
3.0000 mL | RESPIRATORY_TRACT | Status: DC
  Administered 2021-09-22: 3 mL via RESPIRATORY_TRACT
  Filled 2021-09-22: qty 3

## 2021-09-22 MED ORDER — PRAVASTATIN SODIUM 40 MG PO TABS
40.0000 mg | ORAL_TABLET | Freq: Every day | ORAL | Status: DC
  Administered 2021-09-22: 40 mg via ORAL
  Filled 2021-09-22: qty 1

## 2021-09-22 MED ORDER — METHYLPREDNISOLONE SODIUM SUCC 125 MG IJ SOLR
125.0000 mg | Freq: Once | INTRAMUSCULAR | Status: AC
  Administered 2021-09-22: 125 mg via INTRAVENOUS
  Filled 2021-09-22: qty 2

## 2021-09-22 MED ORDER — ENOXAPARIN SODIUM 40 MG/0.4ML IJ SOSY
40.0000 mg | PREFILLED_SYRINGE | Freq: Every day | INTRAMUSCULAR | Status: DC
  Administered 2021-09-22: 40 mg via SUBCUTANEOUS
  Filled 2021-09-22 (×2): qty 0.4

## 2021-09-22 MED ORDER — ONDANSETRON HCL 4 MG/2ML IJ SOLN: 4.0000 mg | Freq: Four times a day (QID) | INTRAMUSCULAR | Status: DC | PRN

## 2021-09-22 MED ORDER — ACETAMINOPHEN 325 MG PO TABS: 650.0000 mg | ORAL_TABLET | Freq: Four times a day (QID) | ORAL | Status: DC | PRN

## 2021-09-22 MED ORDER — BUDESONIDE 0.25 MG/2ML IN SUSP
0.2500 mg | Freq: Two times a day (BID) | RESPIRATORY_TRACT | Status: DC
  Administered 2021-09-22: 0.25 mg via RESPIRATORY_TRACT
  Filled 2021-09-22 (×2): qty 2

## 2021-09-22 MED ORDER — DOCUSATE SODIUM 100 MG PO CAPS: 100.0000 mg | ORAL_CAPSULE | Freq: Two times a day (BID) | ORAL | Status: DC | PRN

## 2021-09-22 MED ORDER — ASPIRIN EC 81 MG PO TBEC
81.0000 mg | DELAYED_RELEASE_TABLET | Freq: Every day | ORAL | Status: DC
  Administered 2021-09-22 – 2021-09-23 (×2): 81 mg via ORAL
  Filled 2021-09-22 (×2): qty 1

## 2021-09-22 MED ORDER — SODIUM CHLORIDE 0.9% FLUSH: 3.0000 mL | INTRAVENOUS | Status: DC | PRN

## 2021-09-22 MED ORDER — DORZOLAMIDE HCL-TIMOLOL MAL 2-0.5 % OP SOLN
1.0000 [drp] | Freq: Two times a day (BID) | OPHTHALMIC | Status: DC
  Administered 2021-09-22 – 2021-09-23 (×2): 1 [drp] via OPHTHALMIC
  Filled 2021-09-22: qty 10

## 2021-09-22 MED ORDER — IPRATROPIUM-ALBUTEROL 0.5-2.5 (3) MG/3ML IN SOLN
3.0000 mL | Freq: Once | RESPIRATORY_TRACT | Status: AC
  Administered 2021-09-22: 3 mL via RESPIRATORY_TRACT
  Filled 2021-09-22: qty 3

## 2021-09-22 MED ORDER — SODIUM CHLORIDE 0.9 % IV SOLN
250.0000 mL | INTRAVENOUS | Status: DC | PRN
Start: 2021-09-22 — End: 2021-09-23

## 2021-09-22 MED ORDER — ALPRAZOLAM 0.5 MG PO TABS
0.5000 mg | ORAL_TABLET | Freq: Every evening | ORAL | Status: DC | PRN
  Administered 2021-09-22: 0.5 mg via ORAL
  Filled 2021-09-22: qty 1

## 2021-09-22 MED ORDER — ONDANSETRON HCL 4 MG PO TABS: 4.0000 mg | ORAL_TABLET | Freq: Four times a day (QID) | ORAL | Status: DC | PRN

## 2021-09-22 MED ORDER — MIRTAZAPINE 15 MG PO TABS
15.0000 mg | ORAL_TABLET | Freq: Every day | ORAL | Status: DC
  Administered 2021-09-22: 15 mg via ORAL
  Filled 2021-09-22: qty 1

## 2021-09-22 MED ORDER — ACETAMINOPHEN 650 MG RE SUPP
650.0000 mg | Freq: Four times a day (QID) | RECTAL | Status: DC | PRN
Start: 2021-09-22 — End: 2021-09-23

## 2021-09-22 MED ORDER — GUAIFENESIN-DM 100-10 MG/5ML PO SYRP: 5.0000 mL | ORAL_SOLUTION | ORAL | Status: DC | PRN

## 2021-09-22 NOTE — Assessment & Plan Note (Addendum)
Likely steroid related with demargination ?Chest x-ray with no significant findings ?Procalcitonin low, no need for antibiotics ?Monitor CBC outpatient ?

## 2021-09-22 NOTE — ED Triage Notes (Signed)
Pt to ED from home c/o sob. Hx COPD. States he has felt bad for about a week, and could not catch his breath after waking this morning. 66% on 2l when EMS arrived. Wears 2l chronic. EMS gave 2 neb treatments and atrovent. 98% on arrival to ED with second neb going.  ?

## 2021-09-22 NOTE — Assessment & Plan Note (Addendum)
Continue lisinopril and monitor ?

## 2021-09-22 NOTE — H&P (Signed)
?History and Physical  ? ? ?Patient: George Meyer UTM:[id number] DOB: 23-Dec-1940 ?DOA: 09/22/2021 ?DOS: the patient was seen and examined on 09/22/2021 ?PCP: Kathyrn Drown, MD  ?Patient coming from: Home ? ?Chief Complaint:  ?Chief Complaint  ?Patient presents with  ? Shortness of Breath  ? ?HPI: George Meyer is a 81 y.o. male with medical history significant of COPD with chronic hypoxemia on 2 L at home, hypertension, dyslipidemia, and anxiety disorder who presented to the ED via EMS for worsening shortness of breath.  He has apparently been having trouble breathing for the last 1 week and was started on prednisone by his PCP on Monday of this week.  He has been using his home inhaler, but his shortness of breath has not been improving.  He denies any fevers, chills, or chest pain.  EMS stated that his O2 saturations at home were in the 60th percentile on 2 L.  He continues to smoke cigarettes daily. ? ?In the ED, chest x-ray did not demonstrate any acute findings outside of COPD.  He has been given high-dose IV steroids and started on breathing treatment. ? ?Review of Systems: As mentioned in the history of present illness. All other systems reviewed and are negative. ?Past Medical History:  ?Diagnosis Date  ? Anxiety   ? Cataracts, bilateral   ? removed by surgery  ? COPD (chronic obstructive pulmonary disease) ([place])   ? Depression, major, single episode, moderate ([place]) 12/18/2018  ? Glaucoma   ? both eyes  ? Glaucoma   ? bilateral  ? H. pylori infection 09/17/2019  ? S/p treatment with Prevpac. Documented eradiation via breath test on 11/03/19.   ? Hyperlipidemia   ? Hypertension   ? Pre-diabetes   ? diet controlled  ? Skin cancer, basal cell   ? arms, face  ? Wears dentures   ? full  ? ?Past Surgical History:  ?Procedure Laterality Date  ? BIOPSY  09/17/2019  ? Procedure: BIOPSY;  Surgeon: Daneil Dolin, MD;  [facility];  Service: Endoscopy;;  gastric  ? CARDIAC CATHETERIZATION N/A 12/2005  ?  negative  ? COLONOSCOPY N/A 12/2009  ? small hyperplastic polyp repeat in 10 years  ? COLONOSCOPY WITH PROPOFOL N/A 12/13/2020  ? Procedure: COLONOSCOPY WITH PROPOFOL;  Surgeon: Eloise Harman, DO;  [facility];  Service: Endoscopy;  Laterality: N/A;  2:45pm  ? DUPUYTREN CONTRACTURE RELEASE Left 02/02/2019  ? Procedure: EXCISION LEFT HAND AND SMALL FINGER DUPUYTREN CONTRACTURE RELEASE;  Surgeon: Marybelle Killings, MD;  Location: [facility];  Service: Orthopedics;  Laterality: Left;  ? ESOPHAGOGASTRODUODENOSCOPY (EGD) WITH PROPOFOL N/A 09/17/2019  ? Procedure: ESOPHAGOGASTRODUODENOSCOPY (EGD) WITH PROPOFOL;  Surgeon: Daneil Dolin, MD; esophageal labs/Schatzki's ring noncritical, not manipulated.  Small hiatal hernia.  Inflamed stomach with biopsies revealing chronic active gastritis with H. pylori.  H. pylori treated with Prevpac.  ? EYE SURGERY Bilateral   ? remove cataracts  ? HAND SURGERY Right 2010  ? dupuytrens excision right little finger  ? POLYPECTOMY  12/13/2020  ? Procedure: POLYPECTOMY;  Surgeon: Eloise Harman, DO;  [facility];  Service: Endoscopy;;  ? TONSILLECTOMY    ? WISDOM TOOTH EXTRACTION    ? ?Social History:  reports that he quit smoking about 10 months ago. His smoking use included cigarettes. He has a 66.00 pack-year smoking history. He has never used smokeless tobacco. He reports that he does not drink alcohol and does not use drugs. ? ?  Allergies  ?Allergen Reactions  ? Amlodipine Itching  ? Chlorzoxazone Itching  ? ? ?Family History  ?Problem Relation Age of Onset  ? Heart attack Father   ? Heart attack Mother   ? Lung cancer Brother   ? Colon cancer Neg Hx   ? ? ?Prior to Admission medications   ?Medication Sig Start Date End Date Taking? Authorizing Provider  ?albuterol (PROVENTIL) (2.5 MG/3ML) 0.083% nebulizer solution USE 1 VIAL IN NEBULIZER EVERY 4 HOURS AS NEEDED FOR WHEEZING 09/21/21  Yes Luking, Scott A, MD  ?albuterol (VENTOLIN HFA) 108 (90 Base) MCG/ACT inhaler  Inhale 2 puffs into the lungs every 4 (four) hours as needed. 09/23/20  Yes Kathyrn Drown, MD  ?ALPRAZolam Duanne Moron) 0.5 MG tablet TAKE 1 TABLET BY MOUTH EVERY DAY AT BEDTIME AS NEEDED 08/21/21  Yes Kathyrn Drown, MD  ?aspirin EC 81 MG tablet Take 81 mg by mouth daily.   Yes [provider]  ?budesonide-formoterol (SYMBICORT) 160-4.5 MCG/ACT inhaler INHALE TWO PUFFS BY MOUTH TWICE DAILY ** STOP SPIRIVA ** 04/25/21  Yes Luking, Elayne Snare, MD  ?Cholecalciferol (D3 PO) Take 1 tablet by mouth daily.   Yes [provider]  ?docusate sodium (COLACE) 100 MG capsule Take 100 mg by mouth 2 (two) times daily as needed for mild constipation.   Yes [provider]  ?dorzolamide-timolol (COSOPT) 22.3-6.8 MG/ML ophthalmic solution Place 1 drop into both eyes 2 (two) times daily. 06/02/19  Yes Kathyrn Drown, MD  ?lisinopril (ZESTRIL) 5 MG tablet Take 1 tablet (5 mg total) by mouth daily. 08/03/21  Yes Kathyrn Drown, MD  ?mirtazapine (REMERON) 15 MG tablet Take 1 tablet (15 mg total) by mouth at bedtime. 07/14/21  Yes Cook, Michael Boston G, DO  ?pravastatin (PRAVACHOL) 40 MG tablet Take 1 tablet (40 mg total) by mouth at bedtime. 08/03/21  Yes Luking, Elayne Snare, MD  ?Saw Palmetto 450 MG CAPS Take 450 mg by mouth in the morning and at bedtime.   Yes [provider]  ?cefPROZIL (CEFZIL) 500 MG tablet Take 1 tablet (500 mg total) by mouth 2 (two) times daily. ?Patient not taking: Reported on 09/22/2021 09/07/21   Kathyrn Drown, MD  ?clobetasol cream (TEMOVATE) 0.05 % Apply thin amount bid prn not on face or groin ?Patient not taking: Reported on 09/07/2021 01/23/21   Kathyrn Drown, MD  ?ergocalciferol (VITAMIN D2) 1.25 MG (50000 UT) capsule Take 1 capsule (50,000 Units total) by mouth once a week. ?Patient not taking: Reported on 09/22/2021 01/12/21   Harriett Rush, PA-C  ?lubiprostone (AMITIZA) 8 MCG capsule Take one capsule once to twice daily WITH FOOD for constipation. ?Patient not taking: Reported on  09/22/2021 11/30/20   Mahala Menghini, PA-C  ?nitroGLYCERIN (NITROSTAT) 0.4 MG SL tablet 1 sl q 5 min prn chest pressure as directed ?Patient taking differently: Place 0.4 mg under the tongue every 5 (five) minutes as needed for chest pain. 03/27/16   Kathyrn Drown, MD  ?nystatin (MYCOSTATIN) 100000 UNIT/ML suspension Take 5 mLs (500,000 Units total) by mouth 4 (four) times daily. Swish and swallow ?Patient not taking: Reported on 09/07/2021 11/29/20   Kathyrn Drown, MD  ?ondansetron (ZOFRAN ODT) 8 MG disintegrating tablet Take 1 tablet 3 x daily has needed ?Patient not taking: Reported on 09/07/2021 05/09/20   Kathyrn Drown, MD  ?predniSONE (DELTASONE) 20 MG tablet 2 qd for 5d ?Patient not taking: Reported on 09/22/2021 09/07/21   Kathyrn Drown, MD  ? ? ?  Physical Exam: ?Vitals:  ? 09/22/21 0800 09/22/21 0853 09/22/21 1000 09/22/21 1100  ?BP: (!) 144/80 (!) 150/78 137/75 (!) 153/77  ?Pulse: 83 84 87 88  ?Resp: (!) 23 (!) 25 (!) 22 20  ?Temp:      ?TempSrc:      ?SpO2: 94% 96% 94% 94%  ?Weight:      ?Height:      ? ?Examination: ?Physical Exam: ? ?Constitutional: WN/WD, NAD and appears calm and comfortable ?Neck: Appears normal, supple, no cervical masses, normal ROM, no appreciable thyromegaly ?Respiratory: Wheezing bilaterally with no accessory muscle use.  Currently on 2.5 L nasal cannula. ?Cardiovascular: RRR, no murmurs / rubs / gallops. S1 and S2 auscultated. No extremity edema. 2+ pedal pulses. No carotid bruits.  ?Abdomen: Soft, non-tender, non-distended. No masses palpated. No appreciable hepatosplenomegaly. Bowel sounds positive.  ?GU: Deferred. ?Musculoskeletal: No clubbing / cyanosis of digits/nails. No joint deformity upper and lower extremities. Good ROM, no contractures. Normal strength and muscle tone.  ?Skin: No rashes, lesions, ulcers. No induration; Warm and dry.  ?Neurologic: CN 2-12 grossly intact with no focal deficits.  ?Psychiatric: Normal judgment and insight. Alert and oriented x 3. Normal mood  and appropriate affect.  ? ?Data Reviewed: ? ?There are no new results to review at this time. ? ?Assessment and Plan: ?* Acute exacerbation of chronic obstructive pulmonary disease (COPD) ([place]) ?Continue o

## 2021-09-22 NOTE — Assessment & Plan Note (Signed)
Continue statin. 

## 2021-09-22 NOTE — ED Provider Notes (Signed)
Emergency Medicine Provider Triage Evaluation Note ? ?George Meyer , a 81 y.o. male  was evaluated in triage.  Pt complains of sob c/w previous copd exacertbations and hypoxia on baseline O2 prior to EMS arrival, somewhat improved with treatments with EMS. ? ?Review of Systems  ?Positive: Sob, cough ?Negative: Chest pain ? ?Physical Exam  ?BP (!) 200/94   Pulse 88   Temp 98 ?F (36.7 ?C) (Oral)   Resp 20   Ht '5\' 8"'$  (1.727 m)   Wt 55 kg   SpO2 98%   BMI 18.44 kg/m?  ?Gen:   Awake, no distress   ?Resp:  Tachypnea, diminished, wheezing ?MSK:   Moves extremities without difficulty  ?Other:   ? ?Medical Decision Making  ?Medically screening exam initiated at 6:53 AM.  Appropriate orders placed.  MAYFIELD SCHOENE was informed that the remainder of the evaluation will be completed by another provider, this initial triage assessment does not replace that evaluation, and the importance of remaining in the ED until their evaluation is complete. ?  ?Merrily Pew, MD ?09/22/21 7829 ? ?

## 2021-09-22 NOTE — Assessment & Plan Note (Addendum)
Encouraged smoking cessation 

## 2021-09-22 NOTE — ED Provider Notes (Addendum)
?[facility] ?Provider Note ? ? ?CSN: [id number] ?Arrival date & time: 09/22/21  2536 ? ?  ? ?History ? ?Chief Complaint  ?Patient presents with  ? Shortness of Breath  ? ? ?George Meyer is a 81 y.o. male. ? ?Patient brought in by EMS.  For significant shortness of breath.  He is known to have a history of COPD.  Patient states has been having trouble breathing all week.  He was started on prednisone on Monday.  Has been using an inhaler.  Breathing just got worse.  No fevers associated with it.  EMS stated that his oxygen saturations at home were 66% on 2 L.  He did patient normally on 2 L.  Here patient placed on 4 L and is satting 98%.  Patient was screened by the overnight emergency physician.  And DuoNeb was ordered.  Chest x-ray was ordered.  I ordered 125 mg of Solu-Medrol IV.  ? ?Past medical history significant for hyperlipidemia COPD hypertension prediabetes glaucoma.  Patient quit smoking tobacco May 2022. ? ? ?  ? ?Home Medications ?Prior to Admission medications   ?Medication Sig Start Date End Date Taking? Authorizing Provider  ?albuterol (PROVENTIL) (2.5 MG/3ML) 0.083% nebulizer solution USE 1 VIAL IN NEBULIZER EVERY 4 HOURS AS NEEDED FOR WHEEZING 09/21/21   Luking, Elayne Snare, MD  ?albuterol (VENTOLIN HFA) 108 (90 Base) MCG/ACT inhaler Inhale 2 puffs into the lungs every 4 (four) hours as needed. 09/23/20   Kathyrn Drown, MD  ?ALPRAZolam Duanne Moron) 0.5 MG tablet TAKE 1 TABLET BY MOUTH EVERY DAY AT BEDTIME AS NEEDED 08/21/21   Kathyrn Drown, MD  ?aspirin EC 81 MG tablet Take 81 mg by mouth daily.    [provider]  ?budesonide-formoterol (SYMBICORT) 160-4.5 MCG/ACT inhaler INHALE TWO PUFFS BY MOUTH TWICE DAILY ** STOP SPIRIVA ** 04/25/21   Kathyrn Drown, MD  ?cefPROZIL (CEFZIL) 500 MG tablet Take 1 tablet (500 mg total) by mouth 2 (two) times daily. 09/07/21   Kathyrn Drown, MD  ?clobetasol cream (TEMOVATE) 0.05 % Apply thin amount bid prn not on face or groin ?Patient  not taking: Reported on 09/07/2021 01/23/21   Kathyrn Drown, MD  ?docusate sodium (COLACE) 100 MG capsule Take 100 mg by mouth 2 (two) times daily as needed for mild constipation.    [provider]  ?dorzolamide-timolol (COSOPT) 22.3-6.8 MG/ML ophthalmic solution Place 1 drop into both eyes 2 (two) times daily. 06/02/19   Kathyrn Drown, MD  ?ergocalciferol (VITAMIN D2) 1.25 MG (50000 UT) capsule Take 1 capsule (50,000 Units total) by mouth once a week. 01/12/21   Harriett Rush, PA-C  ?lisinopril (ZESTRIL) 5 MG tablet Take 1 tablet (5 mg total) by mouth daily. 08/03/21   Kathyrn Drown, MD  ?lubiprostone (AMITIZA) 8 MCG capsule Take one capsule once to twice daily WITH FOOD for constipation. ?Patient taking differently: Take 8 mcg by mouth 2 (two) times daily with a meal. 11/30/20   Mahala Menghini, PA-C  ?mirtazapine (REMERON) 15 MG tablet Take 1 tablet (15 mg total) by mouth at bedtime. 07/14/21   Coral Spikes, DO  ?nitroGLYCERIN (NITROSTAT) 0.4 MG SL tablet 1 sl q 5 min prn chest pressure as directed ?Patient taking differently: Place 0.4 mg under the tongue every 5 (five) minutes as needed for chest pain. 03/27/16   Kathyrn Drown, MD  ?nystatin (MYCOSTATIN) 100000 UNIT/ML suspension Take 5 mLs (500,000 Units total) by mouth 4 (four) times  daily. Swish and swallow ?Patient not taking: Reported on 09/07/2021 11/29/20   Kathyrn Drown, MD  ?ondansetron (ZOFRAN ODT) 8 MG disintegrating tablet Take 1 tablet 3 x daily has needed ?Patient not taking: Reported on 09/07/2021 05/09/20   Kathyrn Drown, MD  ?pravastatin (PRAVACHOL) 40 MG tablet Take 1 tablet (40 mg total) by mouth at bedtime. 08/03/21   Kathyrn Drown, MD  ?predniSONE (DELTASONE) 20 MG tablet 2 qd for 5d 09/07/21   Luking, Elayne Snare, MD  ?Saw Palmetto 450 MG CAPS Take 450 mg by mouth in the morning and at bedtime.    [provider]  ?   ? ?Allergies    ?Amlodipine and Chlorzoxazone   ? ?Review of Systems   ?Review of Systems   ?Constitutional:  Negative for chills and fever.  ?HENT:  Negative for ear pain and sore throat.   ?Eyes:  Negative for pain and visual disturbance.  ?Respiratory:  Positive for shortness of breath. Negative for cough.   ?Cardiovascular:  Negative for chest pain and palpitations.  ?Gastrointestinal:  Negative for abdominal pain and vomiting.  ?Genitourinary:  Negative for dysuria and hematuria.  ?Musculoskeletal:  Negative for arthralgias and back pain.  ?Skin:  Negative for color change and rash.  ?Neurological:  Negative for seizures and syncope.  ?All other systems reviewed and are negative. ? ?Physical Exam ?Updated Vital Signs ?BP (!) 179/86   Pulse 86   Temp 98 ?F (36.7 ?C) (Oral)   Resp (!) 23   Ht 1.727 m ('5\' 8"'$ )   Wt 55 kg   SpO2 98%   BMI 18.44 kg/m?  ?Physical Exam ?Vitals and nursing note reviewed.  ?Constitutional:   ?   General: He is not in acute distress. ?   Appearance: Normal appearance. He is well-developed.  ?HENT:  ?   Head: Normocephalic and atraumatic.  ?Eyes:  ?   Extraocular Movements: Extraocular movements intact.  ?   Conjunctiva/sclera: Conjunctivae normal.  ?   Pupils: Pupils are equal, round, and reactive to light.  ?Cardiovascular:  ?   Rate and Rhythm: Normal rate and regular rhythm.  ?   Heart sounds: No murmur heard. ?Pulmonary:  ?   Effort: Pulmonary effort is normal. No respiratory distress.  ?   Breath sounds: Wheezing present.  ?Abdominal:  ?   Palpations: Abdomen is soft.  ?   Tenderness: There is no abdominal tenderness.  ?Musculoskeletal:     ?   General: No swelling.  ?   Cervical back: Normal range of motion and neck supple.  ?Skin: ?   General: Skin is warm and dry.  ?   Capillary Refill: Capillary refill takes less than 2 seconds.  ?Neurological:  ?   General: No focal deficit present.  ?   Mental Status: He is alert and oriented to person, place, and time.  ?Psychiatric:     ?   Mood and Affect: Mood normal.  ? ? ?ED Results / Procedures / Treatments    ?Labs ?(all labs ordered are listed, but only abnormal results are displayed) ?Labs Reviewed  ?RESP PANEL BY RT-PCR (FLU A&B, COVID) [id number]  ?CBC WITH DIFFERENTIAL/PLATELET  ?COMPREHENSIVE METABOLIC PANEL  ? ? ?EKG ?EKG Interpretation ? ?Date/Time:  Friday September 22 2021 06:46:06 EDT ?Ventricular Rate:  85 ?PR Interval:  150 ?QRS Duration: 136 ?QT Interval:  415 ?QTC Calculation: 494 ?R Axis:   60 ?Text Interpretation: Sinus rhythm Biatrial enlargement Right bundle branch block No significant change  since last tracing Confirmed by Fredia Sorrow [phone]) on 09/22/2021 7:18:08 AM ? ?Radiology ?DG Chest Portable 1 View ? ?Result Date: 09/22/2021 ?CLINICAL DATA:  Shortness of breath for 4 days EXAM: PORTABLE CHEST 1 VIEW COMPARISON:  07/21/2021 FINDINGS: Hyperinflation with interstitial coarsening. Normal heart size and stable mediastinal contours. There is no edema, consolidation, effusion, or pneumothorax. IMPRESSION: COPD without acute superimposed finding. Electronically Signed   By: Jorje Guild M.D.   On: 09/22/2021 07:22   ? ?Procedures ?Procedures  ? ? ?Medications Ordered in ED ?Medications  ?ipratropium-albuterol (DUONEB) 0.5-2.5 (3) MG/3ML nebulizer solution 3 mL (has no administration in time range)  ?methylPREDNISolone sodium succinate (SOLU-MEDROL) 125 mg/2 mL injection 125 mg (has no administration in time range)  ? ? ?ED Course/ Medical Decision Making/ A&P ?  ?                        ?Medical Decision Making ?Risk ?Prescription drug management. ?Decision regarding hospitalization. ? ? ?Patient with persistent wheezing.  Bilaterally.  Will treat with DuoNeb we will give Solu-Medrol IV.  We will screen for COVID although it has not had a fever.  Patient most likely will need admission. ? ?Chest x-ray significant for COPD and no other superimposed acute findings. ? ?Patient's had Solu-Medrol.  Patient's had 2 DuoNebs.  Patient still with some wheezing.  Not completely cleared.  And with moving around on  his normal 2 L of oxygen he drops his sats down to 88%.  Due to the persistent wheezing will get him admitted.  At rest his oxygen sats are around 94%. ? ? ?Final Clinical Impression(s) / ED Diagnoses ?Final diagnoses:  ?COPD exacerba

## 2021-09-22 NOTE — Progress Notes (Signed)
RT called to assess upon EMS arrival. Patient currently on breathing treatment of albuterol and atrovent per EMS and satting 96%. Hooked patient up to wall flowmeter. Still quite a bit of medication left to nebulize. Patient has bilateral diminished breath sounds with wheezing noted. ?

## 2021-09-22 NOTE — Assessment & Plan Note (Signed)
Continue Xanax as needed for symptoms of anxiety ?

## 2021-09-22 NOTE — Assessment & Plan Note (Addendum)
Continue on prednisone as ordered for 5 more days ?Pulmicort twice daily ?DuoNebs at home as needed ?Antitussives prescribed ?Pulmonology referral ?

## 2021-09-23 DIAGNOSIS — J441 Chronic obstructive pulmonary disease with (acute) exacerbation: Secondary | ICD-10-CM | POA: Diagnosis not present

## 2021-09-23 LAB — BASIC METABOLIC PANEL
Anion gap: 5 (ref 5–15)
BUN: 22 mg/dL (ref 8–23)
CO2: 40 mmol/L — ABNORMAL HIGH (ref 22–32)
Calcium: 8.6 mg/dL — ABNORMAL LOW (ref 8.9–10.3)
Chloride: 99 mmol/L (ref 98–111)
Creatinine, Ser: 0.7 mg/dL (ref 0.61–1.24)
GFR, Estimated: >60 mL/min (ref >60)
Glucose, Bld: 151 mg/dL — ABNORMAL HIGH (ref 70–99)
Potassium: 5.3 mmol/L — ABNORMAL HIGH (ref 3.5–5.1)
Sodium: 144 mmol/L (ref 135–145)

## 2021-09-23 LAB — CBC
HCT: 43.1 % (ref 39.0–52.0)
Hemoglobin: 13.5 g/dL (ref 13.0–17.0)
MCH: 32 pg (ref 26.0–34.0)
MCHC: 31.3 g/dL (ref 30.0–36.0)
MCV: 102.1 fL — ABNORMAL HIGH (ref 80.0–100.0)
Platelets: 389 10*3/uL (ref 150–400)
RBC: 4.22 MIL/uL (ref 4.22–5.81)
RDW: 15.2 % (ref 11.5–15.5)
WBC: 18.6 10*3/uL — ABNORMAL HIGH (ref 4.0–10.5)
nRBC: 0 % (ref 0.0–0.2)

## 2021-09-23 LAB — MAGNESIUM: Magnesium: 2.2 mg/dL (ref 1.7–2.4)

## 2021-09-23 MED ORDER — PREDNISONE 10 MG PO TABS: 40.0000 mg | ORAL_TABLET | Freq: Every day | ORAL | 0 refills | Status: AC

## 2021-09-23 MED ORDER — SODIUM POLYSTYRENE SULFONATE 15 GM/60ML PO SUSP
30.0000 g | Freq: Once | ORAL | Status: AC
  Administered 2021-09-23: 30 g via ORAL
  Filled 2021-09-23: qty 120

## 2021-09-23 MED ORDER — GUAIFENESIN-DM 100-10 MG/5ML PO SYRP: 5.0000 mL | ORAL_SOLUTION | ORAL | 0 refills | Status: DC | PRN

## 2021-09-23 NOTE — Progress Notes (Signed)
CSW spoke with Jasmine at [facility] to have home oxygen ordered. [facility] will initiate services with patient for discharge. ? ?Madilyn Fireman, MSW, LCSW ?Transitions of Care  Clinical Social Worker II ?[phone] ? ?

## 2021-09-23 NOTE — Progress Notes (Signed)
Patient discharged home today, transported by family home. Belongings sent home with patient including oxygen delivered by [facility]. ?

## 2021-09-23 NOTE — Progress Notes (Signed)
SATURATION QUALIFICATIONS: (This note is used to comply with regulatory documentation for home oxygen) ? ?Patient Saturations on Room Air at Rest = 87% ? ?Patient Saturations on 4 Liters of oxygen while Ambulating = 90% ? ?Please briefly explain why patient needs home oxygen: Patient had cold fingers, applied hot pack prior to saturation qualification. Oxygen saturation on Room Air at rest dropped quickly to 87%. Placed patient on 4 liters oxygen saturation 93% at rest. Patient ambulated in hallway oxygen saturation 90% at 4 liters. Noted patients fingers cold, applied warmth to finger. MD Manuella Ghazi made aware. ?

## 2021-09-23 NOTE — Discharge Summary (Signed)
?Physician Discharge Summary ?  ?Patient: George Meyer MRN: [id number] DOB: 03-Nov-1940  ?Admit date:     09/22/2021  ?Discharge date: 09/23/21  ?Discharge Physician: Rodena Goldmann  ? ?PCP: Kathyrn Drown, MD  ? ?Recommendations at discharge:  ? ?Follow-up with PCP within the next 1 week and recheck BMP in office ?Referral sent to pulmonology for follow-up regarding COPD with chronic hypoxemia ?Continue prednisone as ordered for 5 more days ?Breathing treatments at home as needed for shortness of breath or wheezing ? ?Discharge Diagnoses: ?Principal Problem: ?  Acute exacerbation of chronic obstructive pulmonary disease (COPD) ([place]) ?Active Problems: ?  Generalized anxiety disorder ?  Leukocytosis ?  HTN (hypertension) ?  Hyperlipidemia ?  Cigarette smoker ? ?Resolved Problems: ?  * No resolved hospital problems. * ? ?Hospital Course: ?Per HPI: ?George Meyer is a 81 y.o. male with medical history significant of COPD with chronic hypoxemia on 2 L at home, hypertension, dyslipidemia, and anxiety disorder who presented to the ED via EMS for worsening shortness of breath.  He has apparently been having trouble breathing for the last 1 week and was started on prednisone by his PCP on Monday of this week.  He has been using his home inhaler, but his shortness of breath has not been improving.  He denies any fevers, chills, or chest pain.  EMS stated that his O2 saturations at home were in the 60th percentile on 2 L.  He continues to smoke cigarettes daily. ? ?3/25: Patient was admitted with acute COPD exacerbation and is overall doing much better this AM.  He feels like he is back to baseline and is eager to discharge.  No acute overnight events noted.  He is in stable condition for discharge and will be referred to pulmonology in the outpatient setting for further follow-up care.  He will need to remain on prednisone as prescribed for 5 more days. ? ?Assessment and Plan: ?* Acute exacerbation of chronic  obstructive pulmonary disease (COPD) ([place]) ?Continue on prednisone as ordered for 5 more days ?Pulmicort twice daily ?DuoNebs at home as needed ?Antitussives prescribed ?Pulmonology referral ? ?Cigarette smoker ?Encouraged smoking cessation ? ?Hyperlipidemia ?Continue statin ? ?HTN (hypertension) ?Continue lisinopril and monitor ? ?Leukocytosis ?Likely steroid related with demargination ?Chest x-ray with no significant findings ?Procalcitonin low, no need for antibiotics ?Monitor CBC outpatient ? ?Generalized anxiety disorder ?Continue Xanax as needed for symptoms of anxiety ? ? ? ? ?  ? ? ?Consultants: None ?Procedures performed: None ?Disposition: Home ?Diet recommendation:  ?Discharge Diet Orders (From admission, onward)  ? ?  Start     Ordered  ? 09/23/21 0000  Diet - low sodium heart healthy       ? 09/23/21 0840  ? ?  ?  ? ?  ? ?Cardiac diet ?DISCHARGE MEDICATION: ?Allergies as of 09/23/2021   ? ?   Reactions  ? Amlodipine Itching  ? Chlorzoxazone Itching  ? ?  ? ?  ?Medication List  ?  ? ?STOP taking these medications   ? ?cefPROZIL 500 MG tablet ?Commonly known as: CEFZIL ?  ?clobetasol cream 0.05 % ?Commonly known as: TEMOVATE ?  ?lubiprostone 8 MCG capsule ?Commonly known as: Amitiza ?  ?ondansetron 8 MG disintegrating tablet ?Commonly known as: Zofran ODT ?  ? ?  ? ?TAKE these medications   ? ?albuterol 108 (90 Base) MCG/ACT inhaler ?Commonly known as: VENTOLIN HFA ?Inhale 2 puffs into the lungs every 4 (four) hours as needed. ?  ?  albuterol (2.5 MG/3ML) 0.083% nebulizer solution ?Commonly known as: PROVENTIL ?USE 1 VIAL IN NEBULIZER EVERY 4 HOURS AS NEEDED FOR WHEEZING ?  ?ALPRAZolam 0.5 MG tablet ?Commonly known as: Duanne Moron ?TAKE 1 TABLET BY MOUTH EVERY DAY AT BEDTIME AS NEEDED ?  ?aspirin EC 81 MG tablet ?Take 81 mg by mouth daily. ?  ?budesonide-formoterol 160-4.5 MCG/ACT inhaler ?Commonly known as: Symbicort ?INHALE TWO PUFFS BY MOUTH TWICE DAILY ** STOP SPIRIVA ** ?  ?D3 PO ?Take 1 tablet by mouth  daily. ?  ?docusate sodium 100 MG capsule ?Commonly known as: COLACE ?Take 100 mg by mouth 2 (two) times daily as needed for mild constipation. ?  ?dorzolamide-timolol 22.3-6.8 MG/ML ophthalmic solution ?Commonly known as: COSOPT ?Place 1 drop into both eyes 2 (two) times daily. ?  ?ergocalciferol 1.25 MG (50000 UT) capsule ?Commonly known as: VITAMIN D2 ?Take 1 capsule (50,000 Units total) by mouth once a week. ?  ?guaiFENesin-dextromethorphan 100-10 MG/5ML syrup ?Commonly known as: ROBITUSSIN DM ?Take 5 mLs by mouth every 4 (four) hours as needed for cough. ?  ?lisinopril 5 MG tablet ?Commonly known as: ZESTRIL ?Take 1 tablet (5 mg total) by mouth daily. ?  ?mirtazapine 15 MG tablet ?Commonly known as: REMERON ?Take 1 tablet (15 mg total) by mouth at bedtime. ?  ?nitroGLYCERIN 0.4 MG SL tablet ?Commonly known as: NITROSTAT ?1 sl q 5 min prn chest pressure as directed ?What changed:  ?how much to take ?how to take this ?when to take this ?reasons to take this ?additional instructions ?  ?nystatin 100000 UNIT/ML suspension ?Commonly known as: MYCOSTATIN ?Take 5 mLs (500,000 Units total) by mouth 4 (four) times daily. Swish and swallow ?  ?pravastatin 40 MG tablet ?Commonly known as: PRAVACHOL ?Take 1 tablet (40 mg total) by mouth at bedtime. ?  ?predniSONE 10 MG tablet ?Commonly known as: DELTASONE ?Take 4 tablets (40 mg total) by mouth daily for 5 days. ?What changed:  ?medication strength ?how much to take ?how to take this ?when to take this ?additional instructions ?  ?Saw Palmetto 450 Bryce ?Take 450 mg by mouth in the morning and at bedtime. ?  ? ?  ? ? Follow-up Information   ? ? Kathyrn Drown, MD. Schedule an appointment as soon as possible for a visit in 1 week(s).   ?Specialty: Family Medicine ?Contact information: ?[address] ?[address] ?[place] [address] ?[phone] ? ? ?  ?  ? ?  ?  ? ?  ? ?Discharge Exam: ?Filed Weights  ? 09/22/21 0645 09/22/21 1600  ?Weight: 55 kg 54.4 kg   ? ?Examination: ?Physical Exam: ? ?Neck: Appears normal, supple, no cervical masses, normal ROM, no appreciable thyromegaly ?Respiratory: Clear to auscultation bilaterally, no wheezing, rales, rhonchi or crackles. Normal respiratory effort and patient is not tachypenic. No accessory muscle use.  On 2 L nasal cannula oxygen. ?Cardiovascular: RRR, no murmurs / rubs / gallops. S1 and S2 auscultated. No extremity edema. 2+ pedal pulses. No carotid bruits.  ?Abdomen: Soft, non-tender, non-distended. No masses palpated. No appreciable hepatosplenomegaly. Bowel sounds positive.  ?GU: Deferred. ?Musculoskeletal: No clubbing / cyanosis of digits/nails. No joint deformity upper and lower extremities. Good ROM, no contractures. Normal strength and muscle tone.  ?Skin: No rashes, lesions, ulcers. No induration; Warm and dry.  ?Neurologic: CN 2-12 grossly intact with no focal deficits.  ?Psychiatric: Normal judgment and insight. Alert and oriented x 3. Normal mood and appropriate affect.  ? ? ?Condition at discharge: stable ? ?The  results of significant diagnostics from this hospitalization (including imaging, microbiology, ancillary and laboratory) are listed below for reference.  ? ?Imaging Studies: ?DG Chest Portable 1 View ? ?Result Date: 09/22/2021 ?CLINICAL DATA:  Shortness of breath for 4 days EXAM: PORTABLE CHEST 1 VIEW COMPARISON:  07/21/2021 FINDINGS: Hyperinflation with interstitial coarsening. Normal heart size and stable mediastinal contours. There is no edema, consolidation, effusion, or pneumothorax. IMPRESSION: COPD without acute superimposed finding. Electronically Signed   By: Jorje Guild M.D.   On: 09/22/2021 07:22   ? ?Microbiology: ?Results for orders placed or performed during the hospital encounter of 09/22/21  ?Resp Panel by RT-PCR (Flu A&B, Covid) Nasopharyngeal Swab     Status: None  ? Collection Time: 09/22/21  9:08 AM  ? Specimen: Nasopharyngeal Swab; Nasopharyngeal(NP) swabs in vial transport  medium  ?Result Value Ref Range Status  ? SARS Coronavirus 2 by RT PCR NEGATIVE NEGATIVE Final  ?  Comment: (NOTE) ?SARS-CoV-2 target nucleic acids are NOT DETECTED. ? ?The SARS-CoV-2 RNA is generally detectable in upper respiratory

## 2021-09-23 NOTE — Hospital Course (Signed)
Per HPI: [name] is a 80 y.o. male with medical history significant of COPD with chronic hypoxemia on 2 L at home, hypertension, dyslipidemia, and anxiety disorder who presented to the ED via EMS for worsening shortness of breath.  He has apparently been having trouble breathing for the last 1 week and was started on prednisone by his PCP on Monday of this week.  He has been using his home inhaler, but his shortness of breath has not been improving.  He denies any fevers, chills, or chest pain.  EMS stated that his O2 saturations at home were in the 60th percentile on 2 L.  He continues to smoke cigarettes daily. ? ?3/25: Patient was admitted with acute COPD exacerbation and is overall doing much better this AM.  He feels like he is back to baseline and is eager to discharge.  No acute overnight events noted.  He is in stable condition for discharge and will be referred to pulmonology in the outpatient setting for further follow-up care.  He will need to remain on prednisone as prescribed for 5 more days. ?

## 2021-09-23 NOTE — Progress Notes (Signed)
Pt has discharge orders, discharge teaching given and no further questions at this time.  

## 2021-09-25 ENCOUNTER — Telehealth: Payer: Self-pay

## 2021-09-25 NOTE — Telephone Encounter (Signed)
Transition Care Management Follow-up Telephone Call ?Date of discharge and from where: 09/23/21 [facility] ?How have you been since you were released from the hospital? Pt states he is doing, "alright." ?Any questions or concerns? No ? ?Items Reviewed: ?Did the pt receive and understand the discharge instructions provided? Yes  ?Medications obtained and verified? Yes  ?Other? No  ?Any new allergies since your discharge? No  ?Dietary orders reviewed? Yes ?Do you have support at home? Yes  ? ?Home Care and Equipment/Supplies: ?Were home health services ordered? not applicable ?If so, what is the name of the agency? N/A  ?Has the agency set up a time to come to the patient's home? no ?Were any new equipment or medical supplies ordered?  Yes: O2 ?What is the name of the medical supply agency? [facility] ?Were you able to get the supplies/equipment? yes ?Do you have any questions related to the use of the equipment or supplies? No ? ?Functional Questionnaire: (I = Independent and D = Dependent) ?ADLs: I ? ?Bathing/Dressing- I ? ?Meal Prep- I ? ?Eating- I ? ?Maintaining continence- I ? ?Transferring/Ambulation- I ? ?Managing Meds- I ? ?Follow up appointments reviewed: ? ?PCP Hospital f/u appt confirmed? Yes  Scheduled to see Dr. Wolfgang Phoenix on 09/28/21 @ 11:20. ?[facility] f/u appt confirmed?  N/A   ?Are transportation arrangements needed? No  ?If their condition worsens, is the pt aware to call PCP or go to the Emergency Dept.? Yes ?Was the patient provided with contact information for the PCP's office or ED? Yes ?Was to pt encouraged to call back with questions or concerns? Yes ? ?

## 2021-09-28 ENCOUNTER — Inpatient Hospital Stay: Payer: PPO | Admitting: Family Medicine

## 2021-09-29 ENCOUNTER — Telehealth: Payer: Self-pay | Admitting: Family Medicine

## 2021-09-29 NOTE — Telephone Encounter (Signed)
[place] phone number-[phone] ?

## 2021-09-29 NOTE — Telephone Encounter (Signed)
Pt daughter [name] calling in. Tonya not on DPR at this time but will be bringing the form to appt on Wednesday with Leonna. Pt states she is will also bring this up at the office visit.  Daughter is wanting to know how she can speak with provider privately regarding support for patient. Daughter states pt was in hospital over the weekend and she does not believe it is in his best interest to live alone. His family is all in [place]. Pt has been placed on oxygen 24 hours also. Please advise. Thank you ?

## 2021-10-02 NOTE — Telephone Encounter (Signed)
I would do the best I can to reach out to her early Wednesday morning .  I will not be back around until later on Tuesday evening-my call to her may be from the office phone number or from cell phone ?Currently out of town ?She will be seen [place] on Wednesday because I will not be back till Thursday.  ?Thank you ?

## 2021-10-02 NOTE — Progress Notes (Addendum)
? ?George Meyer, male    DOB: 1941/02/20   MRN: [id number] ? ? ?Brief patient profile:  ?81  yowm active smoker with onset doe x around 2016 rx symbicort and prn prednisone and referred to pulmonary clinic 10/21/2019 by Dr   Wolfgang Phoenix  ? ? ? ? ?History of Present Illness  ?10/21/2019  Pulmonary/ 1st office eval/Benno Brensinger  ?Chief Complaint  ?Patient presents with  ? Pulmonary Consult  ?  Referred by Dr Sallee Lange for eval of COPD.  Pt states he was dxed with COPD 5 years ago. He gets SOB walking approx 50 yards. He uses his albuterol inhaler 6 x per day and neb 2 x per day.   ?Dyspnea:   MMRC2 = can't walk a nl pace on a flat grade s sob but does fine slow and flat  ?Cough: none / flared 3 x with uri's x 5 years  ?Sleep: flat bed/ one pillow no resp  ?SABA use: 6x daily, never noct and neb twice daily  ?Rec ?Plan A = Automatic = Always=    Symbicort 160 Take 2 puffs first thing in am and then another 2 puffs about 12 hours later.  ?Work on inhaler technique:   ? Plan B = Backup (to supplement plan A, not to replace it) ?Only use your albuterol inhaler as a rescue medication  ?Plan C = Crisis (instead of Plan B but only if Plan B stops working) ?- only use your albuterol nebulizer if you first try Plan B and it fails to help > ok to use the nebulizer up to every 4 hours but if start needing it regularly call for immediate appointment ?Plan D = Deltasone  (if ABC aren't working)  ?Take as per Luking's instructions  ?The key is to stop smoking completely before smoking completely stops you! ?Please schedule a follow up visit in 3 months but call sooner if needed with PFTs on return> not done as of 10/03/2021  ? ?Admit date:     09/22/2021  ?Discharge date: 09/23/21  ?Discharge Physician: Rodena Goldmann  ?  ?PCP: Kathyrn Drown, MD  ?  ?Recommendations at discharge:  ?  ?Follow-up with PCP within the next 1 week and recheck BMP in office ?Referral sent to pulmonology for follow-up regarding COPD with chronic hypoxemia ?Continue  prednisone as ordered for 5 more days ?Breathing treatments at home as needed for shortness of breath or wheezing ?  ?Discharge Diagnoses: ?Principal Problem: ?  Acute exacerbation of chronic obstructive pulmonary disease (COPD) ([place]) ?Active Problems: ?  Generalized anxiety disorder ?  Leukocytosis ?  HTN (hypertension) ?  Hyperlipidemia ?  Cigarette smoker ?   ?  ?Hospital Course: ?Per HPI: ?George Meyer is a 81 y.o. male with medical history significant of COPD with chronic hypoxemia on 2 L at home, hypertension, dyslipidemia, and anxiety disorder who presented to the ED via EMS for worsening shortness of breath.  He has apparently been having trouble breathing for the last 1 week and was started on prednisone by his PCP on Monday of this week.  He has been using his home inhaler, but his shortness of breath has not been improving.  He denies any fevers, chills, or chest pain.  EMS stated that his O2 saturations at home were in the 60th percentile on 2 L.  He continues to smoke cigarettes daily. ?  ?3/25: Patient was admitted with acute COPD exacerbation and is overall doing much better this AM.  He  feels like he is back to baseline and is eager to discharge.  No acute overnight events noted.  He is in stable condition for discharge and will be referred to pulmonology in the outpatient setting for further follow-up care.  He will need to remain on prednisone as prescribed for 5 more days. ?  ?Assessment and Plan: ?* Acute exacerbation of chronic obstructive pulmonary disease (COPD) ([place]) ?Continue on prednisone as ordered for 5 more days ?Pulmicort twice daily ?DuoNebs at home as needed ?Antitussives prescribed ?Pulmonology referral ?  ?Cigarette smoker ?Encouraged smoking cessation ?  ?Hyperlipidemia ?Continue statin ?  ?HTN (hypertension) ?Continue lisinopril and monitor ?  ?Leukocytosis ?Likely steroid related with demargination ?Chest x-ray with no significant findings ?Procalcitonin low, no need for  antibiotics ?Monitor CBC outpatient ?  ?Generalized anxiety disorder ?Continue Xanax as needed for symptoms of anxiety ? ?10/03/2021  post hosp f/u ov/Zayvion Stailey re: severe copd  clinically maint newly on 02 p admit on 4lpom and still smoking "some"  ?Chief Complaint  ?Patient presents with  ? Follow-up  ?  Patient wears 4 liters oxygen all the time. Patient feels like breathing is the same since last visit. Daughter wants to talk about what stage his COPD is.   ?Dyspnea:  staying in house since d/c  ?Cough: congested cough / sensation something stuck in throat / min mucopurulent mucus ?Sleeping:  lies flat/ 2 pillows  ?SABA use:  ?02: 4lpm  ?  ? ? ?No obvious day to day or daytime variability or assoc   mucus plugs or hemoptysis or cp or chest tightness, subjective wheeze or overt sinus or hb symptoms.  ? ?Actually sleeping  without nocturnal  or early am exacerbation  of respiratory  c/o's or need for noct saba. Also denies any obvious fluctuation of symptoms with weather or environmental changes or other aggravating or alleviating factors except as outlined above  ? ?No unusual exposure hx or h/o childhood pna/ asthma or knowledge of premature birth. ? ?Current Allergies, Complete Past Medical History, Past Surgical History, Family History, and Social History were reviewed in [employer] record. ? ?ROS  The following are not active complaints unless bolded ?Hoarseness, sore throat, dysphagia, dental problems, itching, sneezing,  nasal congestion or discharge of excess mucus or purulent secretions, ear ache,   fever, chills, sweats, unintended wt loss or wt gain, classically pleuritic or exertional cp,  orthopnea pnd or arm/hand swelling  or leg swelling, presyncope, palpitations, abdominal pain, anorexia, nausea, vomiting, diarrhea  or change in bowel habits or change in bladder habits, change in stools or change in urine, dysuria, hematuria,  rash, arthralgias, visual complaints, headache,  numbness, weakness or ataxia or problems with walking or coordination,  change in mood or  memory. ?      ? ?Current Meds  ?Medication Sig  ? albuterol (PROVENTIL) (2.5 MG/3ML) 0.083% nebulizer solution USE 1 VIAL IN NEBULIZER EVERY 4 HOURS AS NEEDED FOR WHEEZING  ? albuterol (VENTOLIN HFA) 108 (90 Base) MCG/ACT inhaler Inhale 2 puffs into the lungs every 4 (four) hours as needed.  ? ALPRAZolam (XANAX) 0.5 MG tablet TAKE 1 TABLET BY MOUTH EVERY DAY AT BEDTIME AS NEEDED  ? aspirin EC 81 MG tablet Take 81 mg by mouth daily.  ? budesonide-formoterol (SYMBICORT) 160-4.5 MCG/ACT inhaler INHALE TWO PUFFS BY MOUTH TWICE DAILY ** STOP SPIRIVA **  ? Cholecalciferol (D3 PO) Take 1 tablet by mouth daily.  ? docusate sodium (COLACE) 100 MG capsule Take 100 mg  by mouth 2 (two) times daily as needed for mild constipation.  ? dorzolamide-timolol (COSOPT) 22.3-6.8 MG/ML ophthalmic solution Place 1 drop into both eyes 2 (two) times daily.  ? lisinopril (ZESTRIL) 5 MG tablet Take 1 tablet (5 mg total) by mouth daily.  ? nitroGLYCERIN (NITROSTAT) 0.4 MG SL tablet 1 sl q 5 min prn chest pressure as directed (Patient taking differently: Place 0.4 mg under the tongue every 5 (five) minutes as needed for chest pain.)  ? pravastatin (PRAVACHOL) 40 MG tablet Take 1 tablet (40 mg total) by mouth at bedtime.  ? Saw Palmetto 450 MG CAPS Take 450 mg by mouth in the morning and at bedtime.  ?    ?  ? ?Past Medical History:  ?Diagnosis Date  ? Anxiety   ? Cataracts, bilateral   ? removed by surgery  ? COPD (chronic obstructive pulmonary disease) ([facility])   ? Depression, major, single episode, moderate ([place]) 12/18/2018  ? Glaucoma   ? both eyes  ? Glaucoma   ? bilateral  ? Hyperlipidemia   ? Hypertension   ? Pre-diabetes   ? diet controlled  ? Skin cancer, basal cell   ? arms, face  ? Wears dentures   ? full  ? ?  ? ? ?Objective:  ?  ? ?10/03/2021         118   ?09/22/21 120 lb (54.4 kg)  ?09/07/21 121 lb 6.4 oz (55.1 kg)  ?08/25/21 122 lb (55.3 kg)  ?   ? ? ?Vital signs reviewed  10/03/2021  - Note at rest 02 sats  97%  on 4lpm  ? ?General appearance:    thin wm congested cough  ? ?HEENT : Dentures  ? ? ?NECK :  without JVD/Nodes/TM/ nl carotid upstrokes bilat

## 2021-10-03 ENCOUNTER — Inpatient Hospital Stay: Payer: PPO | Admitting: Nurse Practitioner

## 2021-10-03 ENCOUNTER — Ambulatory Visit: Payer: PPO | Admitting: Internal Medicine

## 2021-10-03 ENCOUNTER — Encounter: Payer: Self-pay | Admitting: Internal Medicine

## 2021-10-03 VITALS — BP 106/60 | HR 88 | Temp 97.7°F | Ht 68.0 in | Wt 118.4 lb

## 2021-10-03 DIAGNOSIS — J449 Chronic obstructive pulmonary disease, unspecified: Secondary | ICD-10-CM

## 2021-10-03 DIAGNOSIS — J9611 Chronic respiratory failure with hypoxia: Secondary | ICD-10-CM

## 2021-10-03 DIAGNOSIS — J9612 Chronic respiratory failure with hypercapnia: Secondary | ICD-10-CM | POA: Diagnosis not present

## 2021-10-03 DIAGNOSIS — F17210 Nicotine dependence, cigarettes, uncomplicated: Secondary | ICD-10-CM | POA: Diagnosis not present

## 2021-10-03 MED ORDER — AZITHROMYCIN 250 MG PO TABS: ORAL_TABLET | ORAL | 0 refills | Status: DC

## 2021-10-03 MED ORDER — PREDNISONE 10 MG PO TABS: ORAL_TABLET | ORAL | 0 refills | Status: DC

## 2021-10-03 NOTE — Patient Instructions (Addendum)
Stop lisinopril and monitor blood pressure and see if over 130ish need new option that is not an ACEi  ? ?Timolol eyedrops are also a concern and ideally should be changed to Betoptic ? ?For cough > mucinex 1200 mg every 12 hours and use the flutter valve as much as possible ? ?Prednisone 10 mg take  4 each am x 2 days,   2 each am x 2 days,  1 each am x 2 days and stop  ? ?Zpak should turn white  ? ?Plan A = Automatic = Always=   Symbicort 160 Take 2 puffs first thing in am and then another 2 puffs about 12 hours later.  ?  ?Work on inhaler technique:  relax and gently blow all the way out then take a nice smooth full deep breath back in, triggering the inhaler at same time you start breathing in.  Hold for up to 5 seconds if you can. Blow out thru nose. Rinse and gargle with water when done.  If mouth or throat bother you at all,  try brushing teeth/gums/tongue with arm and hammer toothpaste/ make a slurry and gargle and spit out.  ? ? ?Plan B = Backup (to supplement plan A, not to replace it) ?Only use your albuterol inhaler as a rescue medication to be used if you can't catch your breath by resting or doing a relaxed purse lip breathing pattern.  ?- The less you use it, the better it will work when you need it. ?- Ok to use the inhaler up to 2 puffs  every 4 hours if you must but call for appointment if use goes up over your usual need ?- Don't leave home without it !!  (think of it like the spare tire for your car)  ? ?Plan C = Crisis (instead of Plan B but only if Plan B stops working) ?- only use your albuterol nebulizer if you first try Plan B and it fails to help > ok to use the nebulizer up to every 4 hours but if start needing it regularly call for immediate appointment ? ? ?Plan D = Doctor ?- call me if B and C not adequate ? ?Plan E = ER ?- go to ER or call 911 if all else fails   ? ?Make sure you check your oxygen saturation  AT  your highest level of activity (not after you stop)   to be sure it is  low 90s % and adjust  02 flow upward to maintain this level if needed but remember to turn it back to previous settings when you stop (to conserve your supply).  ? ?The key is to stop smoking completely before smoking completely stops you! ? ?  ?Please schedule a follow up office visit in 2 weeks, sooner if needed  with all medications /inhalers/ solutions in hand so we can verify exactly what you are taking. This includes all medications from all doctors and over the counters  ?

## 2021-10-03 NOTE — Telephone Encounter (Signed)
Pt daughter [name] returned call and verbalized understanding.  ?

## 2021-10-03 NOTE — Telephone Encounter (Signed)
Left message to return call 

## 2021-10-04 ENCOUNTER — Encounter: Payer: Self-pay | Admitting: Internal Medicine

## 2021-10-04 ENCOUNTER — Inpatient Hospital Stay: Payer: PPO | Admitting: Nurse Practitioner

## 2021-10-04 ENCOUNTER — Telehealth: Payer: Self-pay | Admitting: Family Medicine

## 2021-10-04 DIAGNOSIS — J9611 Chronic respiratory failure with hypoxia: Secondary | ICD-10-CM | POA: Insufficient documentation

## 2021-10-04 NOTE — Telephone Encounter (Signed)
We will discuss all of these issues when the patient comes in on Thursday, I did have a good conversation with the patient but she will sign a form that would allow [place] to go into details with her as well as her dad thank you ?

## 2021-10-04 NOTE — Telephone Encounter (Signed)
Front ?Patient is desiring to go ahead and be scheduled with myself tomorrow at 4:10 PM ?I have told the family to show up at 4 PM ?The daughter will be signing a form with Luke that gives [place] permission to talk with her healthcare wise ?Please cancel appointment with Docia Barrier for today ?

## 2021-10-04 NOTE — Assessment & Plan Note (Signed)
Counseled re importance of smoking cessation but did not meet time criteria for separate billing   ? ?Also warned re cigs/02  ?

## 2021-10-04 NOTE — Assessment & Plan Note (Addendum)
Active smoker  ?- 10/21/2019  After extensive coaching inhaler device,  effectiveness =    50%  ? ?DDX of  difficult airways management almost all start with A and  include Adherence, Ace Inhibitors, Acid Reflux, Active Sinus Disease, Alpha 1 Antitripsin deficiency, Anxiety masquerading as Airways dz,  ABPA,  Allergy(esp in young), Aspiration (esp in elderly), Adverse effects of meds,  Active smoking or vaping, A bunch of PE's (a small clot burden can't cause this syndrome unless there is already severe underlying pulm or vascular dz with poor reserve) plus two Bs  = Bronchiectasis and Beta blocker use..and one C= CHF  ? ? ?Adherence is always the initial "prime suspect" and is a multilayered concern that requires a "trust but verify" approach in every patient - starting with knowing how to use medications, especially inhalers, correctly, keeping up with refills and understanding the fundamental difference between maintenance and prns vs those medications only taken for a very short course and then stopped and not refilled.  ?- - The proper method of use, as well as anticipated side effects, of a metered-dose inhaler were discussed and demonstrated to the patient using teach back method.  ?- return in 2 weeks with all meds in hand using a trust but verify approach to confirm accurate Medication  Reconciliation The principal here is that until we are certain that the  patients are doing what we've asked, it makes no sense to ask them to do more.  ? ?Active smoking > see sep a/p ? ?ACEi adverse effects at the  top of the usual list of suspects and the only way to rule it out is a trial off > see a/p   ? ?? Allergy/asthma component > Prednisone 10 mg take  4 each am x 2 days,   2 each am x 2 days,  1 each am x 2 days and stop  ? ?? Active rhinitis / sinusitis > doubt so just rx zpak  ? ?? BB effects > ideally should be on betoptic not timoptic  ? ? ?

## 2021-10-04 NOTE — Assessment & Plan Note (Signed)
HC03   09/23/21   = 40  ?- as of  10/03/2021  rec 4lpm 24/7 but titrate with activity  ? ?Advised: ?Make sure you check your oxygen saturation  AT  your highest level of activity (not after you stop)   to be sure it stays over 90% and adjust  02 flow upward to maintain this level if needed but remember to turn it back to previous settings when you stop (to conserve your supply).  ? ? ?    ?  ? ?Each maintenance medication was reviewed in detail including emphasizing most importantly the difference between maintenance and prns and under what circumstances the prns are to be triggered using an action plan format where appropriate. ? ?Total time for H and P, chart review, counseling, reviewing hfa/ device(s) and generating customized AVS unique to this office visit / same day charting > 40 min for post hosp ov / pt not seen in 2 y ?     ?

## 2021-10-05 ENCOUNTER — Ambulatory Visit (INDEPENDENT_AMBULATORY_CARE_PROVIDER_SITE_OTHER): Payer: PPO | Admitting: Family Medicine

## 2021-10-05 VITALS — BP 103/68 | HR 84 | Temp 98.4°F | Wt 120.6 lb

## 2021-10-05 DIAGNOSIS — I10 Essential (primary) hypertension: Secondary | ICD-10-CM | POA: Diagnosis not present

## 2021-10-05 DIAGNOSIS — J449 Chronic obstructive pulmonary disease, unspecified: Secondary | ICD-10-CM | POA: Diagnosis not present

## 2021-10-05 DIAGNOSIS — F324 Major depressive disorder, single episode, in partial remission: Secondary | ICD-10-CM | POA: Diagnosis not present

## 2021-10-05 DIAGNOSIS — R634 Abnormal weight loss: Secondary | ICD-10-CM

## 2021-10-05 MED ORDER — MIRTAZAPINE 15 MG PO TBDP: 15.0000 mg | ORAL_TABLET | Freq: Every day | ORAL | 5 refills | Status: DC

## 2021-10-05 NOTE — Progress Notes (Signed)
? ?  Subjective:  ? ? Patient ID: George Meyer, male    DOB: Aug 11, 1940, 81 y.o.   MRN: [id number] ? ?HPI ? ?Patient was admitted to [facility] overnight on 09/21/21 - 09/22/21  for COPD. ?Very nice patient ?Here today for COPD exacerbation follow-up ?Did a good job with his inhalers ?Did see pulmonologist ?Is on oxygen currently but 2 L of O2 ?Patient admits he was disoriented when he called EMS around the time that he was admitted ?Very worrisome that he he does not recall the spells he went through at all ? ?Patient left on 4L of O2 and pulmonary lowered it to 2L ? ?Pulmonary doctor stopped patient's lisinopril.  ?Clock drawing is appropriate ?Immediate recall of 3 items 2 for 3 ?With mini cognitive [place] cognitive assessment he did have some issues ? ?We had a long discussion with the patient regarding his thoughts plus also his daughter's concerns.  It does seem reasonable for patient to stay with his daughter currently to gain some of his strength back.  If perhaps he improves to the point he can go back to his home and then he will do so but otherwise there is a strong possibility he will stay in [place] ?He does not know what he is going to do currently he will discuss this further with his family over the weekend then let us know ? ?Review of Systems ? ?   ?Objective:  ? Physical Exam ?Lungs clear hearts regular pulse normal BP on the low end ? ?Patient voices and understands his situation.  Patient appropriately interacts regarding this aspect.  It is my opinion the patient is competent to make decisions. ? ?   ?Assessment & Plan:  ?No weight ?COPD ?Oxygen dependent at 2 L ?Long-term prognosis guarded ?Patient contemplating moving down to [place] with his daughter ?Mild cognitive dysfunction is noted ?Not Alzheimer's ?Patient is competent to make decisions ? ?Patient has had a thorough work-up in the past with ultrasound scans.  Continue current measures but very important for patient to get this under  better control we did discuss the importance of utilizing better dietary measures to keep his weight ?

## 2021-10-05 NOTE — Patient Instructions (Addendum)
Please keep [place] updated on what decision you make ? ?Realize that we are always there for you whether you are up this way or in [place]. ? ?In the long run you need to do what is right for you and your family. ? ?If you should have further questions concerns issues please let me know ? ?Thanks-Dr. Nicki Reaper ? ? ?

## 2021-10-06 ENCOUNTER — Telehealth: Payer: Self-pay | Admitting: Internal Medicine

## 2021-10-06 NOTE — Telephone Encounter (Signed)
Will await Dr. Wert's response.  °

## 2021-10-06 NOTE — Telephone Encounter (Signed)
Ok with me 

## 2021-10-06 NOTE — Telephone Encounter (Signed)
Called patient and he would like to switch providers from Dr Melvyn Novas to Dr Verlee Monte.  ? ?Dr Melvyn Novas are you ok with this change ? ?Dr Verlee Monte are you ok with this change ? ?Please advise  ?

## 2021-10-07 NOTE — Telephone Encounter (Signed)
I have no problem with a change but I don't recall any issues with the ov other than I identified  a list of possibilities that might be causing issues with his breathing and unless Dr Verlee Monte is available immediately I would advise follow the AVS and call if any reason the instructions can't be tried between now and the f/u with Dr Verlee Monte so Dr Verlee Monte can accurately evaluate the proposed changes.  ? ?

## 2021-10-09 ENCOUNTER — Ambulatory Visit: Payer: PPO | Admitting: Family Medicine

## 2021-10-09 NOTE — Telephone Encounter (Signed)
ATC Tonya. LMTCB  ?

## 2021-10-09 NOTE — Telephone Encounter (Signed)
George Meyer scheduled patient for next available nothing further needed at this time ?

## 2021-10-09 NOTE — Telephone Encounter (Signed)
Called and spoke with George Meyer. She is aware that Dr. Melvyn Novas is ok with the change. I made her aware that Dr. Verlee Monte does not travel to [place]. She was not aware of this information. I did tell her that Dr. Halford Chessman and Dr. Elsworth Soho do travel to [place]. She would like to see if one of them would be willing to take him a patient. Per [name], he can not drive to the [place] office.  ? ?Dr. Elsworth Soho and Dr. Halford Chessman, please advise if you are ok with taking him as a new patient. Thanks!  ?

## 2021-10-12 ENCOUNTER — Encounter: Payer: Self-pay | Admitting: Pulmonary Disease

## 2021-10-12 ENCOUNTER — Ambulatory Visit: Payer: PPO | Admitting: Pulmonary Disease

## 2021-10-12 ENCOUNTER — Institutional Professional Consult (permissible substitution): Payer: PPO | Admitting: Pulmonary Disease

## 2021-10-12 DIAGNOSIS — J9611 Chronic respiratory failure with hypoxia: Secondary | ICD-10-CM

## 2021-10-12 DIAGNOSIS — F17210 Nicotine dependence, cigarettes, uncomplicated: Secondary | ICD-10-CM | POA: Diagnosis not present

## 2021-10-12 DIAGNOSIS — J432 Centrilobular emphysema: Secondary | ICD-10-CM

## 2021-10-12 DIAGNOSIS — J9612 Chronic respiratory failure with hypercapnia: Secondary | ICD-10-CM

## 2021-10-12 DIAGNOSIS — R911 Solitary pulmonary nodule: Secondary | ICD-10-CM | POA: Diagnosis not present

## 2021-10-12 MED ORDER — IPRATROPIUM-ALBUTEROL 0.5-2.5 (3) MG/3ML IN SOLN: 3.0000 mL | Freq: Two times a day (BID) | RESPIRATORY_TRACT | 3 refills | Status: DC

## 2021-10-12 NOTE — Progress Notes (Signed)
? ?Subjective:  ? ? Patient ID: George Meyer, male    DOB: October 23, 1940, 81 y.o.   MRN: [id number] ? ?HPI ? ?81 year old smoker with COPD who is establishing care with me after recent hospitalization. ?He was initially referred to pulmonary clinic in 2021 by his PCP, seen by Dr. Melvyn Novas.  He carries a diagnosis of COPD for at least 5 years, maintained on Symbicort ? ?Adm 08/2021 for COPD exac, states that his symptoms came on rather quick, I reviewed discharge summary, chest x-ray shows hyperinflation and no clear infiltrates, he was noted to be hypoxic to 60% on room air, discharged on 2 L oxygen. ? ?Seen 10/2019 initially by MW then post hosp FU on 10/03/21-I have reviewed office visit. ? ?Since then he has weaned himself off daytime oxygen and only uses this as needed.  He is compliant with nocturnal oxygen.  He has completed course of prednisone.  Feels like breathing is 90% back to baseline. ?He reports productive cough in the morning minimum yellow sputum.  He states that he quit smoking after hospitalization but then admits to smoking 1 to 2 puffs daily. ?He smoked since age 19, more recently was about half pack per day, more than 50 pack years ? ?Significant tests/ events reviewed ? ?HC03   09/23/21   = 40  ?LDCT chest 12/2018 moderate emphysema, right upper lobe 5 mm nodule stable since 2019 ? ? ?Past Medical History:  ?Diagnosis Date  ? Anxiety   ? Cataracts, bilateral   ? removed by surgery  ? COPD (chronic obstructive pulmonary disease) ([place])   ? Depression, major, single episode, moderate ([place]) 12/18/2018  ? Glaucoma   ? both eyes  ? Glaucoma   ? bilateral  ? H. pylori infection 09/17/2019  ? S/p treatment with Prevpac. Documented eradiation via breath test on 11/03/19.   ? Hyperlipidemia   ? Hypertension   ? Pre-diabetes   ? diet controlled  ? Skin cancer, basal cell   ? arms, face  ? Wears dentures   ? full  ? ? ?Past Surgical History:  ?Procedure Laterality Date  ? BIOPSY  09/17/2019  ? Procedure: BIOPSY;   Surgeon: Daneil Dolin, MD;  [facility];  Service: Endoscopy;;  gastric  ? CARDIAC CATHETERIZATION N/A 12/2005  ? negative  ? COLONOSCOPY N/A 12/2009  ? small hyperplastic polyp repeat in 10 years  ? COLONOSCOPY WITH PROPOFOL N/A 12/13/2020  ? Procedure: COLONOSCOPY WITH PROPOFOL;  Surgeon: Eloise Harman, DO;  [facility];  Service: Endoscopy;  Laterality: N/A;  2:45pm  ? DUPUYTREN CONTRACTURE RELEASE Left 02/02/2019  ? Procedure: EXCISION LEFT HAND AND SMALL FINGER DUPUYTREN CONTRACTURE RELEASE;  Surgeon: Marybelle Killings, MD;  Location: [facility];  Service: Orthopedics;  Laterality: Left;  ? ESOPHAGOGASTRODUODENOSCOPY (EGD) WITH PROPOFOL N/A 09/17/2019  ? Procedure: ESOPHAGOGASTRODUODENOSCOPY (EGD) WITH PROPOFOL;  Surgeon: Daneil Dolin, MD; esophageal labs/Schatzki's ring noncritical, not manipulated.  Small hiatal hernia.  Inflamed stomach with biopsies revealing chronic active gastritis with H. pylori.  H. pylori treated with Prevpac.  ? EYE SURGERY Bilateral   ? remove cataracts  ? HAND SURGERY Right 2010  ? dupuytrens excision right little finger  ? POLYPECTOMY  12/13/2020  ? Procedure: POLYPECTOMY;  Surgeon: Eloise Harman, DO;  [facility];  Service: Endoscopy;;  ? TONSILLECTOMY    ? WISDOM TOOTH EXTRACTION    ? ? ?Allergies  ?Allergen Reactions  ? Amlodipine Itching  ? Chlorzoxazone Itching  ? ? ? ?  Social History  ? ?Socioeconomic History  ? Marital status: Single  ?  Spouse name: Not on file  ? Number of children: Not on file  ? Years of education: Not on file  ? Highest education level: Not on file  ?Occupational History  ? Not on file  ?Tobacco Use  ? Smoking status: Former  ?  Packs/day: 1.00  ?  Years: 66.00  ?  Pack years: 66.00  ?  Types: Cigarettes  ?  Quit date: 11/09/2020  ?  Years since quitting: 0.9  ? Smokeless tobacco: Never  ? Tobacco comments:  ?  started smoking age 8  ?Vaping Use  ? Vaping Use: Never used  ?Substance and Sexual Activity  ? Alcohol use:  No  ? Drug use: No  ? Sexual activity: Yes  ?  Partners: Female  ?Other Topics Concern  ? Not on file  ?Social History Narrative  ? Not on file  ? ?Social Determinants of Health  ? ?Financial Resource Strain: Not on file  ?Food Insecurity: Not on file  ?Transportation Needs: Not on file  ?Physical Activity: Not on file  ?Stress: Not on file  ?Social Connections: Not on file  ?Intimate Partner Violence: Not on file  ? ? ?Family History  ?Problem Relation Age of Onset  ? Heart attack Father   ? Heart attack Mother   ? Lung cancer Brother   ? Colon cancer Neg Hx   ? ? ?Review of Systems ?neg for any significant sore throat, dysphagia, itching, sneezing, nasal congestion or excess/ purulent secretions, fever, chills, sweats, unintended wt loss, pleuritic or exertional cp, hempoptysis, orthopnea pnd or change in chronic leg swelling. Also denies presyncope, palpitations, heartburn, abdominal pain, nausea, vomiting, diarrhea or change in bowel or urinary habits, dysuria,hematuria, rash, arthralgias, visual complaints, headache, numbness weakness or ataxia. ? ?   ?Objective:  ? Physical Exam ? ?Gen. Pleasant, elderly, well-nourished, in no distress ?ENT - no thrush, no pallor/icterus,no post nasal drip ?Neck: No JVD, no thyromegaly, no carotid bruits ?Lungs: no use of accessory muscles, no dullness to percussion, clear without rales or rhonchi  ?Cardiovascular: Rhythm regular, heart sounds  normal, no murmurs or gallops, no peripheral edema ?Musculoskeletal: No deformities, no cyanosis or clubbing  ? ? ? ?   ?Assessment & Plan:  ? ? ?

## 2021-10-12 NOTE — Patient Instructions (Signed)
?  Use nicotine patches ? ?Stay on symbicort ? ?X Rx for albuterol + atrovent nebs bid to [facility] (INSTEAD of albuterol ) ? ?X schedule PFTs ? ?X Ambulatory sat ? ?X refer to pulmonary rehab ? ? ?

## 2021-10-12 NOTE — Assessment & Plan Note (Signed)
We discussed increasing to triple therapy, Trelegy or Judithann Sauger are very expensive for him, so we will continue Symbicort and change his nebs from albuterol only to albuterol plus Atrovent which she uses twice a day. ?We discussed COPD action plan. ?We will assess PFTs and start him on pulmonary rehab, he is willing to initiate this program ?

## 2021-10-12 NOTE — Assessment & Plan Note (Signed)
We discussed the importance of smoking cessation is the most important intervention. ?I have asked him to use nicotine patches again, can also provide a Nicotrol inhaler in the future if he still has breakthrough ?

## 2021-10-12 NOTE — Assessment & Plan Note (Signed)
Ambulatory saturation today ? ?Continue nocturnal oxygen. ?Use oxygen as needed in the daytime ?

## 2021-10-12 NOTE — Assessment & Plan Note (Signed)
Noted as far back as 2017 and definitely benign. ?Given his advanced age we will not do screening scans anymore ?

## 2021-10-17 ENCOUNTER — Ambulatory Visit: Payer: PPO | Admitting: Internal Medicine

## 2021-10-18 ENCOUNTER — Telehealth: Payer: Self-pay | Admitting: Pulmonary Disease

## 2021-10-18 MED ORDER — IPRATROPIUM-ALBUTEROL 0.5-2.5 (3) MG/3ML IN SOLN
3.0000 mL | Freq: Two times a day (BID) | RESPIRATORY_TRACT | 1 refills | Status: DC
Start: 2021-10-18 — End: 2021-12-19

## 2021-10-18 NOTE — Telephone Encounter (Signed)
Called and spoke to pharmacy tech (unsure of name) at [facility] on [address] to see if they had duo nebs in stock and she states they do have it in stock. Called and notified patient and he is okay with sending order there. Order placed. Nothing further needed.  ?

## 2021-10-20 ENCOUNTER — Telehealth: Payer: Self-pay | Admitting: Pulmonary Disease

## 2021-10-20 NOTE — Telephone Encounter (Signed)
Called [facility] on [address] and spoke with Mardene Celeste(?) She states they do have  prescription but they need patients insurance info. She states that they will go ahead and fill it and will get patients insurance information when he comes to pickup.  ? ?Called and notified patient and let him know to bring insurance card to [facility] [address] when he picks up rx.  ? ?Nothing further needed ? ?

## 2021-11-08 ENCOUNTER — Ambulatory Visit (INDEPENDENT_AMBULATORY_CARE_PROVIDER_SITE_OTHER): Payer: PPO | Admitting: Family Medicine

## 2021-11-08 VITALS — BP 130/80 | HR 69 | Temp 97.0°F | Wt 126.0 lb

## 2021-11-08 DIAGNOSIS — R600 Localized edema: Secondary | ICD-10-CM | POA: Diagnosis not present

## 2021-11-08 DIAGNOSIS — I10 Essential (primary) hypertension: Secondary | ICD-10-CM

## 2021-11-08 DIAGNOSIS — D72820 Lymphocytosis (symptomatic): Secondary | ICD-10-CM

## 2021-11-08 DIAGNOSIS — R7301 Impaired fasting glucose: Secondary | ICD-10-CM

## 2021-11-08 NOTE — Progress Notes (Signed)
? ?  Subjective:  ? ? Patient ID: George Meyer, male    DOB: 07/07/1940, 81 y.o.   MRN: [id number] ? ?HPI ? ?Patient here for follow up on COPD. ?Fasting hyperglycemia - Plan: Basic Metabolic Panel (BMET), CBC with Differential, Hemoglobin A1C, Hepatic function panel ? ?Lymphocytosis - Plan: Basic Metabolic Panel (BMET), CBC with Differential, Hemoglobin A1C, Hepatic function panel ? ?Pedal edema - Plan: Basic Metabolic Panel (BMET), CBC with Differential, Hemoglobin A1C, Hepatic function panel ? ?Primary hypertension - Plan: Basic Metabolic Panel (BMET), CBC with Differential, Hemoglobin A1C, Hepatic function panel ? ?Patient feels he is doing much better ?Not having to use oxygen is off the ?Does not use it during the day ?Does have to use it late in the evening during the night and early in the morning due to O2 saturations dropping into the mid 80s ?O2 saturation 92-94 during the day ?Patient with some weakness but states he is trying to eat healthier and eat more calories ?Review of Systems ? ?   ?Objective:  ? Physical Exam ?General-in no acute distress ?Eyes-no discharge ?Lungs-respiratory rate normal, CTA ?CV-no murmurs,RRR ?Extremities skin warm dry no edema ?Neuro grossly normal ?Behavior normal, alert ? ? ? ? ?   ?Assessment & Plan:  ? ?1. Fasting hyperglycemia ?Check A1c continue healthy diet regular activity ?- Basic Metabolic Panel (BMET) ?- CBC with Differential ?- Hemoglobin A1C ?- Hepatic function panel ? ?2. Lymphocytosis ?Follow-up CBC WBC was elevated when he was in the hospital we will see how it is doing currently ?- Basic Metabolic Panel (BMET) ?- CBC with Differential ?- Hemoglobin A1C ?- Hepatic function panel ? ?3. Pedal edema ?Mild pedal edema more than likely related to low protein and sitting most of the day no sign of CHF ?- Basic Metabolic Panel (BMET) ?- CBC with Differential ?- Hemoglobin A1C ?- Hepatic function panel ? ?4. Primary hypertension ?Blood pressure good control decent  reading today continue current measures ?- Basic Metabolic Panel (BMET) ?- CBC with Differential ?- Hemoglobin A1C ?- Hepatic function panel ? ?End-stage COPD stable currently needs oxygen at nighttime does not need it during the day in the evenings he states oxygen drops to the mid 80s and he supplements that during the day he is able to get it into the mid 90s without oxygen ? ?Currently right now he would like to continue to stay living here in [place] does not want to go to [place] currently because he does not want to be a burden plus also he has a community of friends here that he does not want to leave currently ? ?Recheck 3 months ? ?Handicap form filled out ?

## 2021-11-10 LAB — BASIC METABOLIC PANEL
BUN/Creatinine Ratio: 10 (ref 10–24)
BUN: 8 mg/dL (ref 8–27)
CO2: 29 mmol/L (ref 20–29)
Calcium: 9.2 mg/dL (ref 8.6–10.2)
Chloride: 100 mmol/L (ref 96–106)
Creatinine, Ser: 0.78 mg/dL (ref 0.76–1.27)
Glucose: 100 mg/dL — ABNORMAL HIGH (ref 70–99)
Potassium: 5.4 mmol/L — ABNORMAL HIGH (ref 3.5–5.2)
Sodium: 143 mmol/L (ref 134–144)
eGFR: 89 mL/min/{1.73_m2} (ref >59)

## 2021-11-10 LAB — CBC WITH DIFFERENTIAL/PLATELET
Basophils Absolute: 0.1 10*3/uL (ref 0.0–0.2)
Basos: 1 %
EOS (ABSOLUTE): 0.7 10*3/uL — ABNORMAL HIGH (ref 0.0–0.4)
Eos: 8 %
Hematocrit: 43.7 % (ref 37.5–51.0)
Hemoglobin: 14.6 g/dL (ref 13.0–17.7)
Immature Grans (Abs): 0 10*3/uL (ref 0.0–0.1)
Immature Granulocytes: 1 %
Lymphocytes Absolute: 2.7 10*3/uL (ref 0.7–3.1)
Lymphs: 31 %
MCH: 32.2 pg (ref 26.6–33.0)
MCHC: 33.4 g/dL (ref 31.5–35.7)
MCV: 97 fL (ref 79–97)
Monocytes Absolute: 0.9 10*3/uL (ref 0.1–0.9)
Monocytes: 11 %
Neutrophils Absolute: 4.4 10*3/uL (ref 1.4–7.0)
Neutrophils: 48 %
Platelets: 436 10*3/uL (ref 150–450)
RBC: 4.53 x10E6/uL (ref 4.14–5.80)
RDW: 13.5 % (ref 11.6–15.4)
WBC: 8.7 10*3/uL (ref 3.4–10.8)

## 2021-11-10 LAB — HEPATIC FUNCTION PANEL
ALT: 8 IU/L (ref 0–44)
AST: 16 IU/L (ref 0–40)
Albumin: 3.9 g/dL (ref 3.6–4.6)
Alkaline Phosphatase: 66 IU/L (ref 44–121)
Bilirubin Total: 0.3 mg/dL (ref 0.0–1.2)
Bilirubin, Direct: 0.11 mg/dL (ref 0.00–0.40)
Total Protein: 6 g/dL (ref 6.0–8.5)

## 2021-11-10 LAB — HEMOGLOBIN A1C
Est. average glucose Bld gHb Est-mCnc: 126 mg/dL
Hgb A1c MFr Bld: 6 % — ABNORMAL HIGH (ref 4.8–5.6)

## 2021-11-13 ENCOUNTER — Encounter (HOSPITAL_COMMUNITY)
Admission: RE | Admit: 2021-11-13 | Discharge: 2021-11-13 | Disposition: A | Discharge status: Home / Self Care | Payer: PPO | Source: Ambulatory Visit | Attending: Pulmonary Disease | Admitting: Pulmonary Disease

## 2021-11-13 ENCOUNTER — Encounter (HOSPITAL_COMMUNITY): Payer: Self-pay

## 2021-11-13 VITALS — BP 130/62 | HR 75 | Ht 68.0 in | Wt 124.1 lb

## 2021-11-13 DIAGNOSIS — J9611 Chronic respiratory failure with hypoxia: Secondary | ICD-10-CM | POA: Insufficient documentation

## 2021-11-13 DIAGNOSIS — J432 Centrilobular emphysema: Secondary | ICD-10-CM | POA: Diagnosis present

## 2021-11-13 DIAGNOSIS — J9612 Chronic respiratory failure with hypercapnia: Secondary | ICD-10-CM | POA: Insufficient documentation

## 2021-11-13 NOTE — Progress Notes (Signed)
Pulmonary Individual Treatment Plan ? ?Patient Details  ?Name: George Meyer ?MRN: [id number] ?Date of Birth: [birth year] ?Referring Provider:   ?Flowsheet Row PULMONARY REHAB OTHER RESP ORIENTATION from 11/13/2021 in [place]  ?Referring Provider Dr. Elsworth Soho  ? ?  ? ? ?Initial Encounter Date:  ?Flowsheet Row PULMONARY REHAB OTHER RESP ORIENTATION from 11/13/2021 in [place]  ?Date 11/13/21  ? ?  ? ? ?Visit Diagnosis: Centrilobular emphysema ([place]) ? ?Chronic respiratory failure with hypoxia and hypercapnia (HCC) ? ?Patient's Home Medications on Admission:  ? ?Current Outpatient Medications:  ?  albuterol (PROVENTIL) (2.5 MG/3ML) 0.083% nebulizer solution, Take 2.5 mg by nebulization every 6 (six) hours as needed for wheezing., Disp: , Rfl:  ?  albuterol (VENTOLIN HFA) 108 (90 Base) MCG/ACT inhaler, Inhale 2 puffs into the lungs every 4 (four) hours as needed., Disp: 54 g, Rfl: 0 ?  ALPRAZolam (XANAX) 0.5 MG tablet, TAKE 1 TABLET BY MOUTH EVERY DAY AT BEDTIME AS NEEDED (Patient taking differently: Take 0.5 mg by mouth at bedtime.), Disp: 30 tablet, Rfl: 5 ?  Ascorbic Acid (VITAMIN C PO), Take 1 tablet by mouth daily., Disp: , Rfl:  ?  aspirin EC 81 MG tablet, Take 81 mg by mouth daily., Disp: , Rfl:  ?  budesonide-formoterol (SYMBICORT) 160-4.5 MCG/ACT inhaler, INHALE TWO PUFFS BY MOUTH TWICE DAILY ** STOP SPIRIVA **, Disp: 1 each, Rfl: 6 ?  docusate sodium (COLACE) 100 MG capsule, Take 100 mg by mouth 2 (two) times daily as needed for mild constipation., Disp: , Rfl:  ?  dorzolamide-timolol (COSOPT) 22.3-6.8 MG/ML ophthalmic solution, Place 1 drop into both eyes 2 (two) times daily., Disp: 10 mL, Rfl: 0 ?  ipratropium-albuterol (DUONEB) 0.5-2.5 (3) MG/3ML SOLN, Take 3 mLs by nebulization 2 (two) times daily., Disp: 180 mL, Rfl: 1 ?  mirtazapine (REMERON SOL-TAB) 15 MG disintegrating tablet, Take 1 tablet (15 mg total) by mouth at bedtime., Disp: 30 tablet, Rfl: 5 ?   Multiple Vitamins-Minerals (IMMUNE SUPPORT PO), Take 1 tablet by mouth daily., Disp: , Rfl:  ?  nitroGLYCERIN (NITROSTAT) 0.4 MG SL tablet, 1 sl q 5 min prn chest pressure as directed (Patient taking differently: Place 0.4 mg under the tongue every 5 (five) minutes x 3 doses as needed for chest pain.), Disp: 50 tablet, Rfl: 3 ?  pravastatin (PRAVACHOL) 40 MG tablet, Take 1 tablet (40 mg total) by mouth at bedtime., Disp: 90 tablet, Rfl: 1 ?  Saw Palmetto 450 MG CAPS, Take 450 mg by mouth in the morning and at bedtime., Disp: , Rfl:  ? ?Past Medical History: ?Past Medical History:  ?Diagnosis Date  ? Anxiety   ? Cataracts, bilateral   ? removed by surgery  ? COPD (chronic obstructive pulmonary disease) ([place])   ? Depression, major, single episode, moderate ([place]) 12/18/2018  ? Glaucoma   ? both eyes  ? Glaucoma   ? bilateral  ? H. pylori infection 09/17/2019  ? S/p treatment with Prevpac. Documented eradiation via breath test on 11/03/19.   ? Hyperlipidemia   ? Hypertension   ? Pre-diabetes   ? diet controlled  ? Skin cancer, basal cell   ? arms, face  ? Wears dentures   ? full  ? ? ?Tobacco Use: ?Social History  ? ?Tobacco Use  ?Smoking Status Former  ? Packs/day: 1.00  ? Years: 66.00  ? Pack years: 66.00  ? Types: Cigarettes  ? Quit date: 11/09/2020  ? Years since quitting: 1.0  ?  Smokeless Tobacco Never  ?Tobacco Comments  ? started smoking age 77  ? ? ?Labs: ?Review Flowsheet   ? ?  ?  Latest Ref Rng & Units 09/11/2017 01/20/2019 09/19/2020 08/21/2021  ?Labs for ITP Cardiac and Pulmonary Rehab  ?Cholestrol 100 - 199 mg/dL 141   140   171   145    ?LDL (calc) 0 - 99 mg/dL 71   63   98   70    ?HDL-C >39 mg/dL 41   37   51   54    ?Trlycerides 0 - 149 mg/dL 146   198   125   119    ?Hemoglobin A1c 4.8 - 5.6 %      ? ?  11/09/2021  ?Labs for ITP Cardiac and Pulmonary Rehab  ?Cholestrol   ?LDL (calc)   ?HDL-C   ?Trlycerides   ?Hemoglobin A1c 6.0    ?  ?  ?  ? ? ?Capillary Blood Glucose: ?Lab Results  ?Component Value Date  ?  GLUCAP 80 12/13/2020  ? ? ? ?Pulmonary Assessment Scores: ? Pulmonary Assessment Scores   ? ? [place] Name 11/13/21 1252  ?  ?  ?  ? ADL UCSD  ? ADL Phase Entry    ? SOB Score total 34    ? Rest 0    ? Walk 2    ? Stairs 4    ? Bath 1    ? Dress 0    ? Shop 2    ?  ? CAT Score  ? CAT Score 13    ?  ? mMRC Score  ? mMRC Score 3    ? ?  ?  ? ?  ? ?UCSD: ?Self-administered rating of dyspnea associated with activities of daily living (ADLs) ?6-point scale (0 = "not at all" to 5 = "maximal or unable to do because of breathlessness")  ?Scoring Scores range from 0 to 120.  Minimally important difference is 5 units ? ?CAT: ?CAT can identify the health impairment of COPD patients and is better correlated with disease progression.  ?CAT has a scoring range of zero to 40. The CAT score is classified into four groups of low (less than 10), medium (10 - 20), high (21-30) and very high (31-40) based on the impact level of disease on health status. A CAT score over 10 suggests significant symptoms.  A worsening CAT score could be explained by an exacerbation, poor medication adherence, poor inhaler technique, or progression of COPD or comorbid conditions.  ?CAT MCID is 2 points ? ?mMRC: ?mMRC (Modified Medical Research Council) Dyspnea Scale is used to assess the degree of baseline functional disability in patients of respiratory disease due to dyspnea. ?No minimal important difference is established. A decrease in score of 1 point or greater is considered a positive change.  ? ?Pulmonary Function Assessment: ? ? ?Exercise Target Goals: ?Exercise Program Goal: ?Individual exercise prescription set using results from initial 6 min walk test and THRR while considering  patient?s activity barriers and safety.  ? ?Exercise Prescription Goal: ?Initial exercise prescription builds to 30-45 minutes a day of aerobic activity, 2-3 days per week.  Home exercise guidelines will be given to patient during program as part of exercise prescription  that the participant will acknowledge. ? ?Activity Barriers & Risk Stratification: ? Activity Barriers & Cardiac Risk Stratification - 11/13/21 1253   ? ?  ? Activity Barriers & Cardiac Risk Stratification  ? Activity Barriers Back Problems;Deconditioning   ?  Cardiac Risk Stratification Low   ? ?  ?  ? ?  ? ? ?6 Minute Walk: ? 6 Minute Walk   ? ? [place] Name 11/13/21 1349  ?  ?  ?  ? 6 Minute Walk  ? Phase Initial    ? Distance 900 feet    ? Walk Time 6 minutes    ? # of Rest Breaks 1    ? MPH 1.7    ? METS 2.17    ? RPE 12    ? Perceived Dyspnea  11    ? VO2 Peak 7.58    ? Symptoms Yes (comment)    ? Comments 1 minute standing rest due to desaturation and to put on nasal canula    ? Resting HR 75 bpm    ? Resting BP 130/62    ? Resting Oxygen Saturation  91 %    ? Exercise Oxygen Saturation  during 6 min walk 81 %    ? Max Ex. HR 95 bpm    ? Max Ex. BP 150/68    ? 2 Minute Post BP 118/68    ?  ? Interval HR  ? 1 Minute HR 88    ? 2 Minute HR 91    ? 3 Minute HR 88    ? 4 Minute HR 84    ? 5 Minute HR 91    ? 6 Minute HR 95    ? 2 Minute Post HR 80    ? Interval Heart Rate? Yes    ?  ? Interval Oxygen  ? Interval Oxygen? Yes    ? Baseline Oxygen Saturation % 91 %    ? 1 Minute Oxygen Saturation % 90 %    ? 1 Minute Liters of Oxygen 0 L    ? 2 Minute Oxygen Saturation % 92 %    ? 2 Minute Liters of Oxygen 0 L    ? 3 Minute Oxygen Saturation % 81 %    ? 3 Minute Liters of Oxygen 0 L    ? 4 Minute Oxygen Saturation % 88 %    ? 4 Minute Liters of Oxygen 2 L    ? 5 Minute Oxygen Saturation % 92 %    ? 5 Minute Liters of Oxygen 2 L    ? 6 Minute Oxygen Saturation % 92 %    ? 6 Minute Liters of Oxygen 2 L    ? 2 Minute Post Oxygen Saturation % 94 %    ? 2 Minute Post Liters of Oxygen 2 L    ? ?  ?  ? ?  ? ? ?Oxygen Initial Assessment: ? Oxygen Initial Assessment - 11/13/21 1348   ? ?  ? Initial 6 min Walk  ? Oxygen Used Continuous   ? Liters per minute 2   ?  ? Program Oxygen Prescription  ? Program Oxygen Prescription  Continuous   ? Liters per minute 2   ? Comments --   ?  ? Intervention  ? Short Term Goals To learn and understand importance of monitoring SPO2 with pulse oximeter and demonstrate accurate use of the pulse oximet

## 2021-11-16 ENCOUNTER — Encounter (HOSPITAL_COMMUNITY)
Admission: RE | Admit: 2021-11-16 | Discharge: 2021-11-16 | Disposition: A | Discharge status: Home / Self Care | Payer: PPO | Source: Ambulatory Visit | Attending: Pulmonary Disease | Admitting: Pulmonary Disease

## 2021-11-16 DIAGNOSIS — J432 Centrilobular emphysema: Secondary | ICD-10-CM

## 2021-11-16 DIAGNOSIS — J9611 Chronic respiratory failure with hypoxia: Secondary | ICD-10-CM

## 2021-11-16 NOTE — Progress Notes (Signed)
Daily Session Note  Patient Details  Name: George Meyer MRN: [id number] Date of Birth: May 02, 1941 Referring Provider:   Flowsheet Row PULMONARY REHAB OTHER RESP ORIENTATION from 11/13/2021 in [place]  Referring Provider Dr. Elsworth Soho       Encounter Date: 11/16/2021  Check In:  Session Check In - 11/16/21 1453       Check-In   Supervising physician immediately available to respond to emergencies [facility] MD immediately available    Physician(s) Dr Harrington Challenger    Location [facility]    Staff Present Hoy Register, MS, ACSM-CEP, Exercise Physiologist;Montie Swiderski Hassell Done, RN, Jennye Moccasin, RN, BSN;Phyllis Billingsley, RN    Virtual Visit No    Medication changes reported     No    Fall or balance concerns reported    No    Tobacco Cessation No Change    Warm-up and Cool-down Performed as group-led instruction    Resistance Training Performed Yes    VAD Patient? No    PAD/SET Patient? No      Pain Assessment   Currently in Pain? No/denies    Multiple Pain Sites No             Capillary Blood Glucose: No results found for this or any previous visit (from the past 24 hour(s)).    Social History   Tobacco Use  Smoking Status Former   Packs/day: 1.00   Years: 66.00   Pack years: 66.00   Types: Cigarettes   Quit date: 11/09/2020   Years since quitting: 1.0  Smokeless Tobacco Never  Tobacco Comments   started smoking age 20    Goals Met:  Independence with exercise equipment Exercise tolerated well No report of concerns or symptoms today Strength training completed today  Goals Unmet:  Not Applicable  Comments: check out at 1600.   Dr. Kathie Dike is Medical Director for [facility] Pul[facility].

## 2021-11-21 ENCOUNTER — Encounter (HOSPITAL_COMMUNITY)
Admission: RE | Admit: 2021-11-21 | Discharge: 2021-11-21 | Disposition: A | Discharge status: Home / Self Care | Payer: PPO | Source: Ambulatory Visit | Attending: Pulmonary Disease | Admitting: Pulmonary Disease

## 2021-11-21 VITALS — Wt 128.5 lb

## 2021-11-21 DIAGNOSIS — J9611 Chronic respiratory failure with hypoxia: Secondary | ICD-10-CM

## 2021-11-21 DIAGNOSIS — J432 Centrilobular emphysema: Secondary | ICD-10-CM

## 2021-11-21 NOTE — Progress Notes (Signed)
Daily Session Note  Patient Details  Name: George Meyer MRN: [id number] Date of Birth: 1941-02-01 Referring Provider:   Flowsheet Row PULMONARY REHAB OTHER RESP ORIENTATION from 11/13/2021 in [place]  Referring Provider Dr. Elsworth Soho       Encounter Date: 11/21/2021  Check In:  Session Check In - 11/21/21 1458       Check-In   Supervising physician immediately available to respond to emergencies [facility] MD immediately available    Physician(s) Dr Domenic Polite    Location [facility]    Staff Present Hoy Register, MS, ACSM-CEP, Exercise Physiologist;Rosan Calbert Hassell Done, RN, BSN;Phyllis Billingsley, RN;Heather Otho Ket, BS, Exercise Physiologist    Virtual Visit No    Medication changes reported     No    Fall or balance concerns reported    No    Tobacco Cessation No Change    Warm-up and Cool-down Performed as group-led instruction    Resistance Training Performed Yes    VAD Patient? No    PAD/SET Patient? No      Pain Assessment   Currently in Pain? No/denies    Multiple Pain Sites No             Capillary Blood Glucose: No results found for this or any previous visit (from the past 24 hour(s)).    Social History   Tobacco Use  Smoking Status Former   Packs/day: 1.00   Years: 66.00   Pack years: 66.00   Types: Cigarettes   Quit date: 11/09/2020   Years since quitting: 1.0  Smokeless Tobacco Never  Tobacco Comments   started smoking age 44    Goals Met:  Independence with exercise equipment Exercise tolerated well No report of concerns or symptoms today Strength training completed today  Goals Unmet:  Not Applicable  Comments: checkout at 1600.   Dr. Kathie Dike is Medical Director for [facility] Pulmonary Rehab.

## 2021-11-22 NOTE — Progress Notes (Signed)
Pulmonary Individual Treatment Plan  Patient Details  Name: George Meyer MRN: [id number] Date of Birth: [birth year] Referring Provider:   Flowsheet Row PULMONARY REHAB OTHER RESP ORIENTATION from 11/13/2021 in [facility]  Referring Provider Dr. Elsworth Soho       Initial Encounter Date:  Flowsheet Row PULMONARY REHAB OTHER RESP ORIENTATION from 11/13/2021 in [place]  Date 11/13/21       Visit Diagnosis: Centrilobular emphysema ([place])  Chronic respiratory failure with hypoxia and hypercapnia ([place])  Patient's Home Medications on Admission:   Current Outpatient Medications:    albuterol (PROVENTIL) (2.5 MG/3ML) 0.083% nebulizer solution, Take 2.5 mg by nebulization every 6 (six) hours as needed for wheezing., Disp: , Rfl:    albuterol (VENTOLIN HFA) 108 (90 Base) MCG/ACT inhaler, Inhale 2 puffs into the lungs every 4 (four) hours as needed., Disp: 54 g, Rfl: 0   ALPRAZolam (XANAX) 0.5 MG tablet, TAKE 1 TABLET BY MOUTH EVERY DAY AT BEDTIME AS NEEDED (Patient taking differently: Take 0.5 mg by mouth at bedtime.), Disp: 30 tablet, Rfl: 5   Ascorbic Acid (VITAMIN C PO), Take 1 tablet by mouth daily., Disp: , Rfl:    aspirin EC 81 MG tablet, Take 81 mg by mouth daily., Disp: , Rfl:    budesonide-formoterol (SYMBICORT) 160-4.5 MCG/ACT inhaler, INHALE TWO PUFFS BY MOUTH TWICE DAILY ** STOP SPIRIVA **, Disp: 1 each, Rfl: 6   docusate sodium (COLACE) 100 MG capsule, Take 100 mg by mouth 2 (two) times daily as needed for mild constipation., Disp: , Rfl:    dorzolamide-timolol (COSOPT) 22.3-6.8 MG/ML ophthalmic solution, Place 1 drop into both eyes 2 (two) times daily., Disp: 10 mL, Rfl: 0   ipratropium-albuterol (DUONEB) 0.5-2.5 (3) MG/3ML SOLN, Take 3 mLs by nebulization 2 (two) times daily., Disp: 180 mL, Rfl: 1   mirtazapine (REMERON SOL-TAB) 15 MG disintegrating tablet, Take 1 tablet (15 mg total) by mouth at bedtime., Disp: 30 tablet, Rfl: 5    Multiple Vitamins-Minerals (IMMUNE SUPPORT PO), Take 1 tablet by mouth daily., Disp: , Rfl:    nitroGLYCERIN (NITROSTAT) 0.4 MG SL tablet, 1 sl q 5 min prn chest pressure as directed (Patient taking differently: Place 0.4 mg under the tongue every 5 (five) minutes x 3 doses as needed for chest pain.), Disp: 50 tablet, Rfl: 3   pravastatin (PRAVACHOL) 40 MG tablet, Take 1 tablet (40 mg total) by mouth at bedtime., Disp: 90 tablet, Rfl: 1   Saw Palmetto 450 MG CAPS, Take 450 mg by mouth in the morning and at bedtime., Disp: , Rfl:   Past Medical History: Past Medical History:  Diagnosis Date   Anxiety    Cataracts, bilateral    removed by surgery   COPD (chronic obstructive pulmonary disease) (HCC)    Depression, major, single episode, moderate ([place]) 12/18/2018   Glaucoma    both eyes   Glaucoma    bilateral   H. pylori infection 09/17/2019   S/p treatment with Prevpac. Documented eradiation via breath test on 11/03/19.    Hyperlipidemia    Hypertension    Pre-diabetes    diet controlled   Skin cancer, basal cell    arms, face   Wears dentures    full    Tobacco Use: Social History   Tobacco Use  Smoking Status Former   Packs/day: 1.00   Years: 66.00   Pack years: 66.00   Types: Cigarettes   Quit date: 11/09/2020   Years since quitting: 1.0  Smokeless Tobacco Never  Tobacco Comments   started smoking age 33    Labs: Review Flowsheet        Latest Ref Rng & Units 09/11/2017 01/20/2019 09/19/2020 08/21/2021  Labs for ITP Cardiac and Pulmonary Rehab  Cholestrol 100 - 199 mg/dL 141   140   171   145    LDL (calc) 0 - 99 mg/dL 71   63   98   70    HDL-C >39 mg/dL 41   37   51   54    Trlycerides 0 - 149 mg/dL 146   198   125   119    Hemoglobin A1c 4.8 - 5.6 %         11/09/2021  Labs for ITP Cardiac and Pulmonary Rehab  Cholestrol   LDL (calc)   HDL-C   Trlycerides   Hemoglobin A1c 6.0            Capillary Blood Glucose: Lab Results  Component Value Date    GLUCAP 80 12/13/2020     Pulmonary Assessment Scores:  Pulmonary Assessment Scores     Row Name 11/13/21 1252         ADL UCSD   ADL Phase Entry     SOB Score total 34     Rest 0     Walk 2     Stairs 4     Bath 1     Dress 0     Shop 2       CAT Score   CAT Score 13       mMRC Score   mMRC Score 3             UCSD: Self-administered rating of dyspnea associated with activities of daily living (ADLs) 6-point scale (0 = "not at all" to 5 = "maximal or unable to do because of breathlessness")  Scoring Scores range from 0 to 120.  Minimally important difference is 5 units  CAT: CAT can identify the health impairment of COPD patients and is better correlated with disease progression.  CAT has a scoring range of zero to 40. The CAT score is classified into four groups of low (less than 10), medium (10 - 20), high (21-30) and very high (31-40) based on the impact level of disease on health status. A CAT score over 10 suggests significant symptoms.  A worsening CAT score could be explained by an exacerbation, poor medication adherence, poor inhaler technique, or progression of COPD or comorbid conditions.  CAT MCID is 2 points  mMRC: mMRC (Modified Medical Research Council) Dyspnea Scale is used to assess the degree of baseline functional disability in patients of respiratory disease due to dyspnea. No minimal important difference is established. A decrease in score of 1 point or greater is considered a positive change.   Pulmonary Function Assessment:   Exercise Target Goals: Exercise Program Goal: Individual exercise prescription set using results from initial 6 min walk test and THRR while considering  patient's activity barriers and safety.   Exercise Prescription Goal: Initial exercise prescription builds to 30-45 minutes a day of aerobic activity, 2-3 days per week.  Home exercise guidelines will be given to patient during program as part of exercise prescription  that the participant will acknowledge.  Activity Barriers & Risk Stratification:  Activity Barriers & Cardiac Risk Stratification - 11/13/21 1253       Activity Barriers & Cardiac Risk Stratification   Activity Barriers Back Problems;Deconditioning  Cardiac Risk Stratification Low             6 Minute Walk:  6 Minute Walk     Row Name 11/13/21 1349         6 Minute Walk   Phase Initial     Distance 900 feet     Walk Time 6 minutes     # of Rest Breaks 1     MPH 1.7     METS 2.17     RPE 12     Perceived Dyspnea  11     VO2 Peak 7.58     Symptoms Yes (comment)     Comments 1 minute standing rest due to desaturation and to put on nasal canula     Resting HR 75 bpm     Resting BP 130/62     Resting Oxygen Saturation  91 %     Exercise Oxygen Saturation  during 6 min walk 81 %     Max Ex. HR 95 bpm     Max Ex. BP 150/68     2 Minute Post BP 118/68       Interval HR   1 Minute HR 88     2 Minute HR 91     3 Minute HR 88     4 Minute HR 84     5 Minute HR 91     6 Minute HR 95     2 Minute Post HR 80     Interval Heart Rate? Yes       Interval Oxygen   Interval Oxygen? Yes     Baseline Oxygen Saturation % 91 %     1 Minute Oxygen Saturation % 90 %     1 Minute Liters of Oxygen 0 L     2 Minute Oxygen Saturation % 92 %     2 Minute Liters of Oxygen 0 L     3 Minute Oxygen Saturation % 81 %     3 Minute Liters of Oxygen 0 L     4 Minute Oxygen Saturation % 88 %     4 Minute Liters of Oxygen 2 L     5 Minute Oxygen Saturation % 92 %     5 Minute Liters of Oxygen 2 L     6 Minute Oxygen Saturation % 92 %     6 Minute Liters of Oxygen 2 L     2 Minute Post Oxygen Saturation % 94 %     2 Minute Post Liters of Oxygen 2 L              Oxygen Initial Assessment:  Oxygen Initial Assessment - 11/13/21 1348       Initial 6 min Walk   Oxygen Used Continuous    Liters per minute 2      Program Oxygen Prescription   Program Oxygen Prescription  Continuous    Liters per minute 2    Comments --      Intervention   Short Term Goals To learn and understand importance of monitoring SPO2 with pulse oximeter and demonstrate accurate use of the pulse oximeter.;To learn and understand importance of maintaining oxygen saturations>88%;To learn and demonstrate proper pursed lip breathing techniques or other breathing techniques.     Long  Term Goals Compliance with respiratory medication;Exhibits proper breathing techniques, such as pursed lip breathing or other method taught during program session;Maintenance of O2 saturations>88%;Verbalizes importance of monitoring SPO2 with  pulse oximeter and return demonstration             Oxygen Re-Evaluation:  Oxygen Re-Evaluation     Row Name 11/22/21 0713             Program Oxygen Prescription   Program Oxygen Prescription Continuous       Liters per minute 2         Home Oxygen   Home Oxygen Device Home Concentrator;E-Tanks       Sleep Oxygen Prescription Continuous       Liters per minute 2       Home Exercise Oxygen Prescription None       Home Resting Oxygen Prescription None       Compliance with Home Oxygen Use Yes         Goals/Expected Outcomes   Short Term Goals To learn and understand importance of monitoring SPO2 with pulse oximeter and demonstrate accurate use of the pulse oximeter.;To learn and understand importance of maintaining oxygen saturations>88%;To learn and demonstrate proper pursed lip breathing techniques or other breathing techniques.        Long  Term Goals Compliance with respiratory medication;Exhibits proper breathing techniques, such as pursed lip breathing or other method taught during program session;Maintenance of O2 saturations>88%;Verbalizes importance of monitoring SPO2 with pulse oximeter and return demonstration       Goals/Expected Outcomes compliance                Oxygen Discharge (Final Oxygen Re-Evaluation):  Oxygen Re-Evaluation -  11/22/21 0713       Program Oxygen Prescription   Program Oxygen Prescription Continuous    Liters per minute 2      Home Oxygen   Home Oxygen Device Home Concentrator;E-Tanks    Sleep Oxygen Prescription Continuous    Liters per minute 2    Home Exercise Oxygen Prescription None    Home Resting Oxygen Prescription None    Compliance with Home Oxygen Use Yes      Goals/Expected Outcomes   Short Term Goals To learn and understand importance of monitoring SPO2 with pulse oximeter and demonstrate accurate use of the pulse oximeter.;To learn and understand importance of maintaining oxygen saturations>88%;To learn and demonstrate proper pursed lip breathing techniques or other breathing techniques.     Long  Term Goals Compliance with respiratory medication;Exhibits proper breathing techniques, such as pursed lip breathing or other method taught during program session;Maintenance of O2 saturations>88%;Verbalizes importance of monitoring SPO2 with pulse oximeter and return demonstration    Goals/Expected Outcomes compliance             Initial Exercise Prescription:  Initial Exercise Prescription - 11/13/21 1300       Date of Initial Exercise RX and Referring Provider   Date 11/13/21    Referring Provider Dr. Elsworth Soho    Expected Discharge Date 03/20/22      Oxygen   Oxygen Continuous    Liters 2    Maintain Oxygen Saturation 88% or higher      Treadmill   MPH 1    Grade 0    Minutes 17      NuStep   Level 1    SPM 80    Minutes 22      Prescription Details   Frequency (times per week) 2    Duration Progress to 30 minutes of continuous aerobic without signs/symptoms of physical distress      Intensity   THRR 40-80% of Max Heartrate 55-110  Ratings of Perceived Exertion 11-13    Perceived Dyspnea 0-4      Resistance Training   Training Prescription Yes    Weight 3    Reps 10-15             Perform Capillary Blood Glucose checks as needed.  Exercise  Prescription Changes:   Exercise Prescription Changes     Row Name 11/21/21 1500             Response to Exercise   Blood Pressure (Admit) 120/60       Blood Pressure (Exercise) 118/74       Blood Pressure (Exit) 110/68       Heart Rate (Admit) 74 bpm       Heart Rate (Exercise) 87 bpm       Heart Rate (Exit) 71 bpm       Oxygen Saturation (Admit) 92 %       Oxygen Saturation (Exercise) 92 %       Oxygen Saturation (Exit) 97 %       Rating of Perceived Exertion (Exercise) 12       Perceived Dyspnea (Exercise) 12       Duration Continue with 30 min of aerobic exercise without signs/symptoms of physical distress.       Intensity THRR unchanged         Progression   Progression Continue to progress workloads to maintain intensity without signs/symptoms of physical distress.         Resistance Training   Training Prescription Yes       Weight 3       Reps 10-15       Time 10 Minutes         Oxygen   Oxygen Continuous       Liters 2         Treadmill   MPH 1       Grade 0       Minutes 22       METs 1.77         NuStep   Level 1       SPM 83       Minutes 17       METs 1.9         Oxygen   Maintain Oxygen Saturation 88% or higher                Exercise Comments:   Exercise Goals and Review:   Exercise Goals     Row Name 11/13/21 1355 11/22/21 0716           Exercise Goals   Increase Physical Activity Yes Yes      Intervention Provide advice, education, support and counseling about physical activity/exercise needs.;Develop an individualized exercise prescription for aerobic and resistive training based on initial evaluation findings, risk stratification, comorbidities and participant's personal goals. Provide advice, education, support and counseling about physical activity/exercise needs.;Develop an individualized exercise prescription for aerobic and resistive training based on initial evaluation findings, risk stratification, comorbidities and  participant's personal goals.      Expected Outcomes Short Term: Attend rehab on a regular basis to increase amount of physical activity.;Long Term: Add in home exercise to make exercise part of routine and to increase amount of physical activity.;Long Term: Exercising regularly at least 3-5 days a week. Short Term: Attend rehab on a regular basis to increase amount of physical activity.;Long Term: Add in home exercise to make exercise part of  routine and to increase amount of physical activity.;Long Term: Exercising regularly at least 3-5 days a week.      Increase Strength and Stamina Yes Yes      Intervention Provide advice, education, support and counseling about physical activity/exercise needs.;Develop an individualized exercise prescription for aerobic and resistive training based on initial evaluation findings, risk stratification, comorbidities and participant's personal goals. Provide advice, education, support and counseling about physical activity/exercise needs.;Develop an individualized exercise prescription for aerobic and resistive training based on initial evaluation findings, risk stratification, comorbidities and participant's personal goals.      Expected Outcomes Short Term: Increase workloads from initial exercise prescription for resistance, speed, and METs.;Short Term: Perform resistance training exercises routinely during rehab and add in resistance training at home;Long Term: Improve cardiorespiratory fitness, muscular endurance and strength as measured by increased METs and functional capacity (6MWT) Short Term: Increase workloads from initial exercise prescription for resistance, speed, and METs.;Short Term: Perform resistance training exercises routinely during rehab and add in resistance training at home;Long Term: Improve cardiorespiratory fitness, muscular endurance and strength as measured by increased METs and functional capacity (6MWT)      Able to understand and use rate of  perceived exertion (RPE) scale Yes Yes      Intervention Provide education and explanation on how to use RPE scale Provide education and explanation on how to use RPE scale      Expected Outcomes Short Term: Able to use RPE daily in rehab to express subjective intensity level;Long Term:  Able to use RPE to guide intensity level when exercising independently Short Term: Able to use RPE daily in rehab to express subjective intensity level;Long Term:  Able to use RPE to guide intensity level when exercising independently      Able to understand and use Dyspnea scale Yes Yes      Intervention Provide education and explanation on how to use Dyspnea scale Provide education and explanation on how to use Dyspnea scale      Expected Outcomes Short Term: Able to use Dyspnea scale daily in rehab to express subjective sense of shortness of breath during exertion;Long Term: Able to use Dyspnea scale to guide intensity level when exercising independently Short Term: Able to use Dyspnea scale daily in rehab to express subjective sense of shortness of breath during exertion;Long Term: Able to use Dyspnea scale to guide intensity level when exercising independently      Knowledge and understanding of Target Heart Rate Range (THRR) Yes Yes      Intervention Provide education and explanation of THRR including how the numbers were predicted and where they are located for reference Provide education and explanation of THRR including how the numbers were predicted and where they are located for reference      Expected Outcomes Short Term: Able to state/look up THRR;Long Term: Able to use THRR to govern intensity when exercising independently;Short Term: Able to use daily as guideline for intensity in rehab Short Term: Able to state/look up THRR;Long Term: Able to use THRR to govern intensity when exercising independently;Short Term: Able to use daily as guideline for intensity in rehab      Understanding of Exercise Prescription  Yes Yes      Intervention Provide education, explanation, and written materials on patient's individual exercise prescription Provide education, explanation, and written materials on patient's individual exercise prescription      Expected Outcomes Long Term: Able to explain home exercise prescription to exercise independently;Short Term: Able to explain program  exercise prescription Long Term: Able to explain home exercise prescription to exercise independently;Short Term: Able to explain program exercise prescription               Exercise Goals Re-Evaluation :  Exercise Goals Re-Evaluation     [place] Name 11/22/21 0716             Exercise Goal Re-Evaluation   Exercise Goals Review Increase Physical Activity;Able to understand and use Dyspnea scale;Understanding of Exercise Prescription;Knowledge and understanding of Target Heart Rate Range (THRR);Increase Strength and Stamina;Able to understand and use rate of perceived exertion (RPE) scale       Comments Pt has completed 2 exercise sessions of PR. He is motivated to improve himself. He has been 1 week cigarette free and reports that he is feeling much better due to this. He is currently exercising at 1.9 METs on the stepper. Will continue to monitor and progress as able.       Expected Outcomes Through exercise at rehab and at home, the patient will meet their stated goals.                Discharge Exercise Prescription (Final Exercise Prescription Changes):  Exercise Prescription Changes - 11/21/21 1500       Response to Exercise   Blood Pressure (Admit) 120/60    Blood Pressure (Exercise) 118/74    Blood Pressure (Exit) 110/68    Heart Rate (Admit) 74 bpm    Heart Rate (Exercise) 87 bpm    Heart Rate (Exit) 71 bpm    Oxygen Saturation (Admit) 92 %    Oxygen Saturation (Exercise) 92 %    Oxygen Saturation (Exit) 97 %    Rating of Perceived Exertion (Exercise) 12    Perceived Dyspnea (Exercise) 12    Duration Continue  with 30 min of aerobic exercise without signs/symptoms of physical distress.    Intensity THRR unchanged      Progression   Progression Continue to progress workloads to maintain intensity without signs/symptoms of physical distress.      Resistance Training   Training Prescription Yes    Weight 3    Reps 10-15    Time 10 Minutes      Oxygen   Oxygen Continuous    Liters 2      Treadmill   MPH 1    Grade 0    Minutes 22    METs 1.77      NuStep   Level 1    SPM 83    Minutes 17    METs 1.9      Oxygen   Maintain Oxygen Saturation 88% or higher             Nutrition:  Target Goals: Understanding of nutrition guidelines, daily intake of sodium '1500mg'$ , cholesterol '200mg'$ , calories 30% from fat and 7% or less from saturated fats, daily to have 5 or more servings of fruits and vegetables.  Biometrics:  Pre Biometrics - 11/13/21 1355       Pre Biometrics   Height '5\' 8"'$  (1.727 m)    Weight 56.3 kg    Waist Circumference 35.5 inches    Hip Circumference 36 inches    Waist to Hip Ratio 0.99 %    BMI (Calculated) 18.88    Triceps Skinfold 16 mm    % Body Fat 23.1 %    Grip Strength 27.6 kg    Flexibility 0 in    Single Leg Stand 8 seconds  Nutrition Therapy Plan and Nutrition Goals:  Nutrition Therapy & Goals - 11/20/21 0752       Personal Nutrition Goals   Comments Patient scored 36 on his diet assessment. We offer 2 educational sessions on heart healthy nutrition with handouts.      Intervention Plan   Intervention Nutrition handout(s) given to patient.    Expected Outcomes Short Term Goal: Understand basic principles of dietary content, such as calories, fat, sodium, cholesterol and nutrients.             Nutrition Assessments:  Nutrition Assessments - 11/13/21 1300       MEDFICTS Scores   Pre Score 36            MEDIFICTS Score Key: ?70 Need to make dietary changes  40-70 Heart Healthy Diet ? 40 Therapeutic Level  Cholesterol Diet   Picture Your Plate Scores: <92 Unhealthy dietary pattern with much room for improvement. 41-50 Dietary pattern unlikely to meet recommendations for good health and room for improvement. 51-60 More healthful dietary pattern, with some room for improvement.  >60 Healthy dietary pattern, although there may be some specific behaviors that could be improved.    Nutrition Goals Re-Evaluation:   Nutrition Goals Discharge (Final Nutrition Goals Re-Evaluation):   Psychosocial: Target Goals: Acknowledge presence or absence of significant depression and/or stress, maximize coping skills, provide positive support system. Participant is able to verbalize types and ability to use techniques and skills needed for reducing stress and depression.  Initial Review & Psychosocial Screening:  Initial Psych Review & Screening - 11/13/21 1406       Initial Review   Current issues with Current Anxiety/Panic;Current Depression      Family Dynamics   Good Support System? Yes    Comments His support system is his friend, Terri Piedra. He reports having a good relationship with his daughter and ex wife as well.      Barriers   Psychosocial barriers to participate in program The patient should benefit from training in stress management and relaxation.      Screening Interventions   Interventions Encouraged to exercise    Expected Outcomes Long Term goal: The participant improves quality of Life and PHQ9 Scores as seen by post scores and/or verbalization of changes;Short Term goal: Identification and review with participant of any Quality of Life or Depression concerns found by scoring the questionnaire.             Quality of Life Scores:  Quality of Life - 11/13/21 1356       Quality of Life   Select Quality of Life      Quality of Life Scores   Health/Function Pre 19.96 %    Socioeconomic Pre 25.75 %    Psych/Spiritual Pre 29 %    Family Pre 30 %    GLOBAL Pre 24.57 %             Scores of 19 and below usually indicate a poorer quality of life in these areas.  A difference of  2-3 points is a clinically meaningful difference.  A difference of 2-3 points in the total score of the Quality of Life Index has been associated with significant improvement in overall quality of life, self-image, physical symptoms, and general health in studies assessing change in quality of life.   PHQ-9: Review Flowsheet        11/13/2021 04/25/2021 01/23/2021 12/09/2020 09/12/2020  Depression screen PHQ 2/9  Decreased Interest 0 1 2 0 0  Down, Depressed, Hopeless 0 1 0 0 0  PHQ - 2 Score 0 2 2 0 0  Altered sleeping 0 0 0  0  Tired, decreased energy '1 1 1  '$ 0  Change in appetite '1 2 1  3  '$ Feeling bad or failure about yourself  0 0 0  0  Trouble concentrating 0 0 0  0  Moving slowly or fidgety/restless 0 0 0  0  Suicidal thoughts 0 0 0  0  PHQ-9 Score '2 5 4  3  '$ Difficult doing work/chores Not difficult at all Not difficult at all Not difficult at all  Not difficult at all         Interpretation of Total Score  Total Score Depression Severity:  1-4 = Minimal depression, 5-9 = Mild depression, 10-14 = Moderate depression, 15-19 = Moderately severe depression, 20-27 = Severe depression   Psychosocial Evaluation and Intervention:  Psychosocial Evaluation - 11/13/21 1358       Psychosocial Evaluation & Interventions   Interventions Encouraged to exercise with the program and follow exercise prescription;Relaxation education;Stress management education    Comments Pt has no barriers to participating in PR. He has no identifiable psychosocial issues, but he is on mirtazapine 15 mg and alprazolam 0.5 mg for depression and anxiety. When questioned about his anxiety and depression he denies having both, and is reports that Dr. Wolfgang Phoenix put him on these about 4 years ago. He states that he is on these medications because he is "hyperactive and nervous". He scored a 2 on his PHQ-9 and  this relates to his lack of energy from his lung disease and his poor appetite. He has been trying for some time to gain weight, but has been unable to do so. He has smoked cigarettes for about 70 years ranging from 1/4 pack per day to a pack per day. He has recently been smoking 1/4 pack per day. He reports that he quit smoking as of today. He has tried to quit smoking over 20 times but has been able to do so. He is using nicotine lozenges to aid his smoking cessation. He is motivated to quit and is hopeful that this will finally be the time he is able to quit for good. He reports that he has a good support system. He has a group of friends that he keeps up with. Both of his children and his ex-wife live in [place], and he reports that he speaks with them frequently. His goals while in the program are to stay cigarette free and to decrease his SOB with exertion. He is eager to begin the program.    Expected Outcomes Pt will continue to have no identifiable psychosocial issues and his anxiety and depression will continue to be treated.    Continue Psychosocial Services  No Follow up required             Psychosocial Re-Evaluation:  Psychosocial Re-Evaluation     [place] Name 11/20/21 0729             Psychosocial Re-Evaluation   Current issues with History of Depression       Comments Patient is new to the program completing 2 sessions. He does have a history of major depression and anxiety which is managed with mirtazapine and Alprazolam. He seems to enjoy coming to the program and demonstrates an interest in improving his health. We will continue to monitor his progress.       Expected Outcomes Patient will continue  to have no psychosocial barriers identified and his depression and anxiety will continue to be managed.       Interventions Stress management education;Encouraged to attend Pulmonary Rehabilitation for the exercise;Relaxation education       Continue Psychosocial Services  No Follow  up required                Psychosocial Discharge (Final Psychosocial Re-Evaluation):  Psychosocial Re-Evaluation - 11/20/21 0729       Psychosocial Re-Evaluation   Current issues with History of Depression    Comments Patient is new to the program completing 2 sessions. He does have a history of major depression and anxiety which is managed with mirtazapine and Alprazolam. He seems to enjoy coming to the program and demonstrates an interest in improving his health. We will continue to monitor his progress.    Expected Outcomes Patient will continue to have no psychosocial barriers identified and his depression and anxiety will continue to be managed.    Interventions Stress management education;Encouraged to attend Pulmonary Rehabilitation for the exercise;Relaxation education    Continue Psychosocial Services  No Follow up required              Education: Education Goals: Education classes will be provided on a weekly basis, covering required topics. Participant will state understanding/return demonstration of topics presented.  Learning Barriers/Preferences:  Learning Barriers/Preferences - 11/13/21 1301       Learning Barriers/Preferences   Learning Barriers None    Learning Preferences Audio;Computer/Internet;Group Instruction;Individual Instruction;Pictoral;Skilled Demonstration;Verbal Instruction;Video;Written Material             Education Topics: How Lungs Work and Diseases: - Discuss the anatomy of the lungs and diseases that can affect the lungs, such as COPD.   Exercise: -Discuss the importance of exercise, FITT principles of exercise, normal and abnormal responses to exercise, and how to exercise safely.   Environmental Irritants: -Discuss types of environmental irritants and how to limit exposure to environmental irritants.   Meds/Inhalers and oxygen: - Discuss respiratory medications, definition of an inhaler and oxygen, and the proper way to  use an inhaler and oxygen.   Energy Saving Techniques: - Discuss methods to conserve energy and decrease shortness of breath when performing activities of daily living.    Bronchial Hygiene / Breathing Techniques: - Discuss breathing mechanics, pursed-lip breathing technique,  proper posture, effective ways to clear airways, and other functional breathing techniques   Cleaning Equipment: - Provides group verbal and written instruction about the health risks of elevated stress, cause of high stress, and healthy ways to reduce stress.   Nutrition I: Fats: - Discuss the types of cholesterol, what cholesterol does to the body, and how cholesterol levels can be controlled. Flowsheet Row PULMONARY REHAB OTHER RESPIRATORY from 11/16/2021 in [place]  Date 11/16/21  Educator DF  Instruction Review Code 1- Verbalizes Understanding       Nutrition II: Labels: -Discuss the different components of food labels and how to read food labels.   Respiratory Infections: - Discuss the signs and symptoms of respiratory infections, ways to prevent respiratory infections, and the importance of seeking medical treatment when having a respiratory infection.   Stress I: Signs and Symptoms: - Discuss the causes of stress, how stress may lead to anxiety and depression, and ways to limit stress.   Stress II: Relaxation: -Discuss relaxation techniques to limit stress.   Oxygen for Home/Travel: - Discuss how to prepare for travel when on oxygen and proper ways  to transport and store oxygen to ensure safety.   Knowledge Questionnaire Score:  Knowledge Questionnaire Score - 11/13/21 1302       Knowledge Questionnaire Score   Pre Score 9/18             Core Components/Risk Factors/Patient Goals at Admission:  Personal Goals and Risk Factors at Admission - 11/13/21 1304       Core Components/Risk Factors/Patient Goals on Admission   Tobacco Cessation Yes    Number  of packs per day 1/4 recently, but quit 11/13/2021    Intervention Assist the participant in steps to quit. Provide individualized education and counseling about committing to Tobacco Cessation, relapse prevention, and pharmacological support that can be provided by physician.;Advice worker, assist with locating and accessing local/national Quit Smoking programs, and support quit date choice.    Expected Outcomes Long Term: Complete abstinence from all tobacco products for at least 12 months from quit date.;Short Term: Will quit all tobacco product use, adhering to prevention of relapse plan.;Short Term: Will demonstrate readiness to quit, by selecting a quit date.    Improve shortness of breath with ADL's Yes    Intervention Provide education, individualized exercise plan and daily activity instruction to help decrease symptoms of SOB with activities of daily living.    Expected Outcomes Short Term: Improve cardiorespiratory fitness to achieve a reduction of symptoms when performing ADLs;Long Term: Be able to perform more ADLs without symptoms or delay the onset of symptoms    Hypertension Yes    Intervention Provide education on lifestyle modifcations including regular physical activity/exercise, weight management, moderate sodium restriction and increased consumption of fresh fruit, vegetables, and low fat dairy, alcohol moderation, and smoking cessation.;Monitor prescription use compliance.    Expected Outcomes Short Term: Continued assessment and intervention until BP is < 140/56m HG in hypertensive participants. < 130/837mHG in hypertensive participants with diabetes, heart failure or chronic kidney disease.;Long Term: Maintenance of blood pressure at goal levels.    Lipids Yes    Intervention Provide education and support for participant on nutrition & aerobic/resistive exercise along with prescribed medications to achieve LDL '70mg'$ , HDL >'40mg'$ .    Expected Outcomes Short Term:  Participant states understanding of desired cholesterol values and is compliant with medications prescribed. Participant is following exercise prescription and nutrition guidelines.;Long Term: Cholesterol controlled with medications as prescribed, with individualized exercise RX and with personalized nutrition plan. Value goals: LDL < '70mg'$ , HDL > 40 mg.             Core Components/Risk Factors/Patient Goals Review:   Goals and Risk Factor Review     Row Name 11/20/21 0733             Core Components/Risk Factors/Patient Goals Review   Personal Goals Review Tobacco Cessation;Weight Management/Obesity;Improve shortness of breath with ADL's;Lipids;Hypertension;Other       Review Patient was referred to PR wtih Centrilobuler emphysema and chronic respiratory failure. He has completed 1 session exercising on 2L with oxygen saturations running 95%. His personal goals for the program are to decrease his SOB with exertion and continue to abstain from smoking. We will continue to monitor his progress as he works towards meeting these goals.       Expected Outcomes Patient will complete the program meeting both personal and program goals.                Core Components/Risk Factors/Patient Goals at Discharge (Final Review):   Goals and Risk Factor Review -  11/20/21 0733       Core Components/Risk Factors/Patient Goals Review   Personal Goals Review Tobacco Cessation;Weight Management/Obesity;Improve shortness of breath with ADL's;Lipids;Hypertension;Other    Review Patient was referred to PR wtih Centrilobuler emphysema and chronic respiratory failure. He has completed 1 session exercising on 2L with oxygen saturations running 95%. His personal goals for the program are to decrease his SOB with exertion and continue to abstain from smoking. We will continue to monitor his progress as he works towards meeting these goals.    Expected Outcomes Patient will complete the program meeting both  personal and program goals.             ITP Comments:   Comments: ITP REVIEW Pt is making expected progress toward pulmonary rehab goals after completing 3 sessions. Recommend continued exercise, life style modification, education, and utilization of breathing techniques to increase stamina and strength and decrease shortness of breath with exertion.

## 2021-11-23 ENCOUNTER — Encounter (HOSPITAL_COMMUNITY)
Admission: RE | Admit: 2021-11-23 | Discharge: 2021-11-23 | Disposition: A | Discharge status: Home / Self Care | Payer: PPO | Source: Ambulatory Visit | Attending: Pulmonary Disease | Admitting: Pulmonary Disease

## 2021-11-23 DIAGNOSIS — J432 Centrilobular emphysema: Secondary | ICD-10-CM | POA: Diagnosis not present

## 2021-11-23 DIAGNOSIS — J9611 Chronic respiratory failure with hypoxia: Secondary | ICD-10-CM

## 2021-11-23 NOTE — Progress Notes (Signed)
Daily Session Note  Patient Details  Name: George Meyer MRN: [id number] Date of Birth: January 07, 1941 Referring Provider:   Flowsheet Row PULMONARY REHAB OTHER RESP ORIENTATION from 11/13/2021 in [place]  Referring Provider Dr. Elsworth Soho       Encounter Date: 11/23/2021  Check In:  Session Check In - 11/23/21 1454       Check-In   Supervising physician immediately available to respond to emergencies [facility] MD immediately available    Physician(s) Dr Johney Frame    Location [facility]    Staff Present Hoy Register, MS, ACSM-CEP, Exercise Physiologist;Davinia Riccardi Hassell Done, RN, BSN;Heather Otho Ket, BS, Exercise Physiologist    Virtual Visit No    Medication changes reported     No    Fall or balance concerns reported    No    Tobacco Cessation No Change    Warm-up and Cool-down Performed as group-led instruction    Resistance Training Performed Yes    VAD Patient? No    PAD/SET Patient? No      Pain Assessment   Currently in Pain? No/denies    Multiple Pain Sites No             Capillary Blood Glucose: No results found for this or any previous visit (from the past 24 hour(s)).    Social History   Tobacco Use  Smoking Status Former   Packs/day: 1.00   Years: 66.00   Pack years: 66.00   Types: Cigarettes   Quit date: 11/09/2020   Years since quitting: 1.0  Smokeless Tobacco Never  Tobacco Comments   started smoking age 55    Goals Met:  Independence with exercise equipment Exercise tolerated well No report of concerns or symptoms today Strength training completed today  Goals Unmet:  Not Applicable  Comments: Checkout at 1600.   Dr. Kathie Dike is Medical Director for [facility] Pulmonary Rehab.

## 2021-11-28 ENCOUNTER — Encounter (HOSPITAL_COMMUNITY)
Admission: RE | Admit: 2021-11-28 | Discharge: 2021-11-28 | Disposition: A | Discharge status: Home / Self Care | Payer: PPO | Source: Ambulatory Visit | Attending: Pulmonary Disease | Admitting: Pulmonary Disease

## 2021-11-28 DIAGNOSIS — J9611 Chronic respiratory failure with hypoxia: Secondary | ICD-10-CM

## 2021-11-28 DIAGNOSIS — J432 Centrilobular emphysema: Secondary | ICD-10-CM

## 2021-11-28 NOTE — Progress Notes (Signed)
Daily Session Note  Patient Details  Name: George Meyer MRN: [id number] Date of Birth: 12/10/1940 Referring Provider:   Flowsheet Row PULMONARY REHAB OTHER RESP ORIENTATION from 11/13/2021 in [place]  Referring Provider Dr. Elsworth Soho       Encounter Date: 11/28/2021  Check In:  Session Check In - 11/28/21 1500       Check-In   Supervising physician immediately available to respond to emergencies [facility] MD immediately available    Physician(s) Dr. Harl Bowie    Location [facility]    Staff Present Hoy Register, MS, ACSM-CEP, Exercise Physiologist;Daphyne Hassell Done, RN, BSN;Anaissa Macfadden Otho Ket, BS, Exercise Physiologist;Phyllis Billingsley, RN    Virtual Visit No    Medication changes reported     No    Fall or balance concerns reported    No    Tobacco Cessation No Change    Warm-up and Cool-down Performed as group-led instruction    Resistance Training Performed Yes    VAD Patient? No    PAD/SET Patient? No      Pain Assessment   Currently in Pain? No/denies    Multiple Pain Sites No             Capillary Blood Glucose: No results found for this or any previous visit (from the past 24 hour(s)).    Social History   Tobacco Use  Smoking Status Former   Packs/day: 1.00   Years: 66.00   Pack years: 66.00   Types: Cigarettes   Quit date: 11/09/2020   Years since quitting: 1.0  Smokeless Tobacco Never  Tobacco Comments   started smoking age 67    Goals Met:  Independence with exercise equipment Exercise tolerated well No report of concerns or symptoms today Strength training completed today  Goals Unmet:  Not Applicable  Comments: check out 1600   Dr. Kathie Dike is Medical Director for [facility] Pulmonary Rehab.

## 2021-11-30 ENCOUNTER — Encounter (HOSPITAL_COMMUNITY)
Admission: RE | Admit: 2021-11-30 | Discharge: 2021-11-30 | Disposition: A | Discharge status: Home / Self Care | Payer: PPO | Source: Ambulatory Visit | Attending: Pulmonary Disease | Admitting: Pulmonary Disease

## 2021-11-30 DIAGNOSIS — J432 Centrilobular emphysema: Secondary | ICD-10-CM | POA: Diagnosis present

## 2021-11-30 DIAGNOSIS — J9612 Chronic respiratory failure with hypercapnia: Secondary | ICD-10-CM | POA: Diagnosis present

## 2021-11-30 DIAGNOSIS — J9611 Chronic respiratory failure with hypoxia: Secondary | ICD-10-CM | POA: Insufficient documentation

## 2021-11-30 NOTE — Progress Notes (Signed)
Daily Session Note  Patient Details  Name: George Meyer MRN: [id number] Date of Birth: 1941-02-04 Referring Provider:   Flowsheet Row PULMONARY REHAB OTHER RESP ORIENTATION from 11/13/2021 in [place]  Referring Provider Dr. Elsworth Soho       Encounter Date: 11/30/2021  Check In:  Session Check In - 11/30/21 1437       Check-In   Supervising physician immediately available to respond to emergencies [facility] MD immediately available    Physician(s) Dr. Harl Bowie    Location [facility]    Staff Present Hoy Register, MS, ACSM-CEP, Exercise Physiologist;Daphyne Hassell Done, RN, BSN;Phyllis Billingsley, RN;Debra Wynetta Emery, RN, BSN    Virtual Visit No    Medication changes reported     No    Fall or balance concerns reported    No    Tobacco Cessation No Change    Warm-up and Cool-down Performed as group-led instruction    Resistance Training Performed Yes    VAD Patient? No    PAD/SET Patient? No      Pain Assessment   Currently in Pain? No/denies    Multiple Pain Sites No             Capillary Blood Glucose: No results found for this or any previous visit (from the past 24 hour(s)).    Social History   Tobacco Use  Smoking Status Former   Packs/day: 1.00   Years: 66.00   Pack years: 66.00   Types: Cigarettes   Quit date: 11/09/2020   Years since quitting: 1.0  Smokeless Tobacco Never  Tobacco Comments   started smoking age 39    Goals Met:  Independence with exercise equipment Exercise tolerated well No report of concerns or symptoms today Strength training completed today  Goals Unmet:  Not Applicable  Comments: check out 1600   Dr. Kathie Dike is Medical Director for [facility] Pulmonary Rehab.

## 2021-12-05 ENCOUNTER — Encounter (HOSPITAL_COMMUNITY)
Admission: RE | Admit: 2021-12-05 | Discharge: 2021-12-05 | Disposition: A | Discharge status: Home / Self Care | Payer: PPO | Source: Ambulatory Visit | Attending: Pulmonary Disease | Admitting: Pulmonary Disease

## 2021-12-05 VITALS — Wt 133.2 lb

## 2021-12-05 DIAGNOSIS — J9611 Chronic respiratory failure with hypoxia: Secondary | ICD-10-CM

## 2021-12-05 DIAGNOSIS — J432 Centrilobular emphysema: Secondary | ICD-10-CM

## 2021-12-05 NOTE — Progress Notes (Signed)
Daily Session Note  Patient Details  Name: George Meyer MRN: [id number] Date of Birth: [birth year] Referring Provider:   Flowsheet Row PULMONARY REHAB OTHER RESP ORIENTATION from 11/13/2021 in [place]  Referring Provider Dr. Elsworth Soho       Encounter Date: 12/05/2021  Check In:  Session Check In - 12/05/21 1456       Check-In   Supervising physician immediately available to respond to emergencies [facility] MD immediately available    Physician(s) Dr Johnsie Cancel    Location [facility]    Staff Present Hoy Register, MS, ACSM-CEP, Exercise Physiologist;Dymond Gutt Hassell Done, RN, BSN;Phyllis Billingsley, RN;Heather Otho Ket, BS, Exercise Physiologist    Virtual Visit No    Medication changes reported     No    Fall or balance concerns reported    No    Tobacco Cessation No Change    Warm-up and Cool-down Performed as group-led instruction    Resistance Training Performed Yes    VAD Patient? No    PAD/SET Patient? No      Pain Assessment   Currently in Pain? No/denies    Multiple Pain Sites No             Capillary Blood Glucose: No results found for this or any previous visit (from the past 24 hour(s)).    Social History   Tobacco Use  Smoking Status Former   Packs/day: 1.00   Years: 66.00   Pack years: 66.00   Types: Cigarettes   Quit date: 11/09/2020   Years since quitting: 1.0  Smokeless Tobacco Never  Tobacco Comments   started smoking age 48    Goals Met:  Independence with exercise equipment Exercise tolerated well No report of concerns or symptoms today Strength training completed today  Goals Unmet:  Not Applicable  Comments: checkout at 1600.   Dr. Kathie Dike is Medical Director for [facility] Pulmonary Rehab.

## 2021-12-07 ENCOUNTER — Encounter (HOSPITAL_COMMUNITY)
Admission: RE | Admit: 2021-12-07 | Discharge: 2021-12-07 | Disposition: A | Discharge status: Home / Self Care | Payer: PPO | Source: Ambulatory Visit | Attending: Pulmonary Disease | Admitting: Pulmonary Disease

## 2021-12-07 DIAGNOSIS — J9611 Chronic respiratory failure with hypoxia: Secondary | ICD-10-CM | POA: Diagnosis not present

## 2021-12-07 DIAGNOSIS — J432 Centrilobular emphysema: Secondary | ICD-10-CM

## 2021-12-07 NOTE — Progress Notes (Signed)
Daily Session Note  Patient Details  Name: George Meyer MRN: [id number] Date of Birth: Sep 06, 1940 Referring Provider:   Flowsheet Row PULMONARY REHAB OTHER RESP ORIENTATION from 11/13/2021 in [place]  Referring Provider Dr. Elsworth Soho       Encounter Date: 12/07/2021  Check In:  Session Check In - 12/07/21 1449       Check-In   Supervising physician immediately available to respond to emergencies [facility] MD immediately available    Physician(s) Dr Domenic Polite    Location [facility]    Staff Present Hoy Register, MS, ACSM-CEP, Exercise Physiologist;Tyna Huertas Hassell Done, RN, BSN;Heather Otho Ket, BS, Exercise Physiologist    Virtual Visit No    Medication changes reported     No    Fall or balance concerns reported    No    Tobacco Cessation No Change    Warm-up and Cool-down Performed as group-led instruction    Resistance Training Performed Yes    VAD Patient? No    PAD/SET Patient? No      Pain Assessment   Currently in Pain? No/denies    Multiple Pain Sites No             Capillary Blood Glucose: No results found for this or any previous visit (from the past 24 hour(s)).    Social History   Tobacco Use  Smoking Status Former   Packs/day: 1.00   Years: 66.00   Total pack years: 66.00   Types: Cigarettes   Quit date: 11/09/2020   Years since quitting: 1.0  Smokeless Tobacco Never  Tobacco Comments   started smoking age 81    Goals Met:  Independence with exercise equipment Exercise tolerated well No report of concerns or symptoms today Strength training completed today  Goals Unmet:  Not Applicable  Comments: Checkout at 1600.   Dr. Kathie Dike is Medical Director for [facility] Pulmonary Rehab.

## 2021-12-12 ENCOUNTER — Encounter (HOSPITAL_COMMUNITY)
Admission: RE | Admit: 2021-12-12 | Discharge: 2021-12-12 | Disposition: A | Discharge status: Home / Self Care | Payer: PPO | Source: Ambulatory Visit | Attending: Pulmonary Disease | Admitting: Pulmonary Disease

## 2021-12-12 DIAGNOSIS — J9611 Chronic respiratory failure with hypoxia: Secondary | ICD-10-CM | POA: Diagnosis not present

## 2021-12-12 DIAGNOSIS — J432 Centrilobular emphysema: Secondary | ICD-10-CM

## 2021-12-12 NOTE — Progress Notes (Signed)
Daily Session Note  Patient Details  Name: George Meyer MRN: [id number] Date of Birth: 1941/06/30 Referring Provider:   Flowsheet Row PULMONARY REHAB OTHER RESP ORIENTATION from 11/13/2021 in [place]  Referring Provider Dr. Elsworth Soho       Encounter Date: 12/12/2021  Check In:   Capillary Blood Glucose: No results found for this or any previous visit (from the past 24 hour(s)).    Social History   Tobacco Use  Smoking Status Former   Packs/day: 1.00   Years: 66.00   Total pack years: 66.00   Types: Cigarettes   Quit date: 11/09/2020   Years since quitting: 1.0  Smokeless Tobacco Never  Tobacco Comments   started smoking age 71    Goals Met:  Independence with exercise equipment Exercise tolerated well No report of concerns or symptoms today Strength training completed today  Goals Unmet:  Not Applicable  Comments: checkout at 1600.   Dr. Kathie Dike is Medical Director for [facility] Pulmonary Rehab.

## 2021-12-12 NOTE — Progress Notes (Signed)
I have reviewed a Home Exercise Prescription with [name] . George Meyer is currently exercising at home by walking 30-45 minutes a day.  The patient was advised to walk 3 days a week for 30-45 minutes.  Iyan and I discussed how to progress their exercise prescription.  The patient stated that their goals were build back his strength and endurance.  The patient stated that they understand the exercise prescription.  We reviewed exercise guidelines, target heart rate during exercise, RPE Scale, weather conditions, NTG use, endpoints for exercise, warmup and cool down.  Patient is encouraged to come to me with any questions. I will continue to follow up with the patient to assist them with progression and safety.

## 2021-12-12 NOTE — Progress Notes (Signed)
Daily Session Note  Patient Details  Name: George Meyer MRN: [id number] Date of Birth: 08/22/1940 Referring Provider:   Flowsheet Row PULMONARY REHAB OTHER RESP ORIENTATION from 11/13/2021 in [place]  Referring Provider Dr. Elsworth Soho       Encounter Date: 12/05/2021  Check In:  Session Check In - 12/12/21 1436       Check-In   Supervising physician immediately available to respond to emergencies [facility] MD immediately available    Physician(s) Dr Audie Box    Location [facility]    Staff Present Hoy Register, MS, ACSM-CEP, Exercise Physiologist;Evalina Tabak Hassell Done, RN, BSN;Heather Otho Ket, BS, Exercise Physiologist;Phyllis Billingsley, RN    Virtual Visit No    Medication changes reported     No    Fall or balance concerns reported    No    Tobacco Cessation No Change    Warm-up and Cool-down Performed as group-led instruction    Resistance Training Performed Yes    VAD Patient? No    PAD/SET Patient? No      Pain Assessment   Currently in Pain? No/denies    Multiple Pain Sites No             Capillary Blood Glucose: No results found for this or any previous visit (from the past 24 hour(s)).    Social History   Tobacco Use  Smoking Status Former   Packs/day: 1.00   Years: 66.00   Total pack years: 66.00   Types: Cigarettes   Quit date: 11/09/2020   Years since quitting: 1.0  Smokeless Tobacco Never  Tobacco Comments   started smoking age 53    Goals Met:  Independence with exercise equipment Exercise tolerated well No report of concerns or symptoms today Strength training completed today  Goals Unmet:  Not Applicable  Comments: Checkout at 1600.   Dr. Kathie Dike is Medical Director for [facility] Pulmonary Rehab.

## 2021-12-14 ENCOUNTER — Encounter (HOSPITAL_COMMUNITY)
Admission: RE | Admit: 2021-12-14 | Discharge: 2021-12-14 | Disposition: A | Discharge status: Home / Self Care | Payer: PPO | Source: Ambulatory Visit | Attending: Pulmonary Disease | Admitting: Pulmonary Disease

## 2021-12-14 DIAGNOSIS — J9611 Chronic respiratory failure with hypoxia: Secondary | ICD-10-CM

## 2021-12-14 DIAGNOSIS — J432 Centrilobular emphysema: Secondary | ICD-10-CM

## 2021-12-16 ENCOUNTER — Other Ambulatory Visit: Payer: Self-pay | Admitting: Pulmonary Disease

## 2021-12-18 NOTE — Progress Notes (Signed)
Daily Session Note  Patient Details  Name: George Meyer MRN: [id number] Date of Birth: Jul 28, 1940 Referring Provider:   Flowsheet Row PULMONARY REHAB OTHER RESP ORIENTATION from 11/13/2021 in [place]  Referring Provider Dr. Elsworth Soho       Encounter Date: 12/14/2021  Check In:   Capillary Blood Glucose: No results found for this or any previous visit (from the past 24 hour(s)).    Social History   Tobacco Use  Smoking Status Former   Packs/day: 1.00   Years: 66.00   Total pack years: 66.00   Types: Cigarettes   Quit date: 11/09/2020   Years since quitting: 1.1  Smokeless Tobacco Never  Tobacco Comments   started smoking age 50    Goals Met:  Independence with exercise equipment Exercise tolerated well No report of concerns or symptoms today Strength training completed today  Goals Unmet:  Not Applicable  Comments: checkout time Is 4   Dr. Kathie Dike is Medical Director for [facility] Pulmonary Rehab.

## 2021-12-19 ENCOUNTER — Encounter (HOSPITAL_COMMUNITY)
Admission: RE | Admit: 2021-12-19 | Discharge: 2021-12-19 | Disposition: A | Discharge status: Home / Self Care | Payer: PPO | Source: Ambulatory Visit | Attending: Pulmonary Disease | Admitting: Pulmonary Disease

## 2021-12-19 DIAGNOSIS — J9611 Chronic respiratory failure with hypoxia: Secondary | ICD-10-CM

## 2021-12-19 DIAGNOSIS — J432 Centrilobular emphysema: Secondary | ICD-10-CM

## 2021-12-19 NOTE — Progress Notes (Signed)
Daily Session Note  Patient Details  Name: George Meyer MRN: [id number] Date of Birth: 05/07/1941 Referring Provider:   Flowsheet Row PULMONARY REHAB OTHER RESP ORIENTATION from 11/13/2021 in [place]  Referring Provider Dr. Elsworth Soho       Encounter Date: 12/19/2021  Check In:  Session Check In - 12/19/21 1455       Check-In   Supervising physician immediately available to respond to emergencies [facility] MD immediately available    Physician(s) Dr Johney Frame    Location [facility]    Staff Present Hoy Register, MS, ACSM-CEP, Exercise Physiologist;Heather Zigmund Daniel, Exercise Physiologist;Delinda Malan Hassell Done, RN, BSN    Virtual Visit No    Medication changes reported     No    Fall or balance concerns reported    No    Tobacco Cessation No Change    Warm-up and Cool-down Performed as group-led instruction    Resistance Training Performed Yes    VAD Patient? No    PAD/SET Patient? No      Pain Assessment   Currently in Pain? No/denies    Multiple Pain Sites No             Capillary Blood Glucose: No results found for this or any previous visit (from the past 24 hour(s)).    Social History   Tobacco Use  Smoking Status Former   Packs/day: 1.00   Years: 66.00   Total pack years: 66.00   Types: Cigarettes   Quit date: 11/09/2020   Years since quitting: 1.1  Smokeless Tobacco Never  Tobacco Comments   started smoking age 29    Goals Met:  Independence with exercise equipment Exercise tolerated well No report of concerns or symptoms today Strength training completed today  Goals Unmet:  Not Applicable  Comments: checkout at 1600.   Dr. Kathie Dike is Medical Director for [facility].

## 2021-12-20 NOTE — Progress Notes (Signed)
Pulmonary Individual Treatment Plan  Patient Details  Name: George Meyer MRN: [id number] Date of Birth: 12-Jun-1941 Referring Provider:   Flowsheet Row PULMONARY REHAB OTHER RESP ORIENTATION from 11/13/2021 in [facility]  Referring Provider Dr. Elsworth Soho       Initial Encounter Date:  Flowsheet Row PULMONARY REHAB OTHER RESP ORIENTATION from 11/13/2021 in [facility]  Date 11/13/21       Visit Diagnosis: Centrilobular emphysema ([place])  Chronic respiratory failure with hypoxia and hypercapnia ([place])  Patient's Home Medications on Admission:   Current Outpatient Medications:    albuterol (PROVENTIL) (2.5 MG/3ML) 0.083% nebulizer solution, Take 2.5 mg by nebulization every 6 (six) hours as needed for wheezing., Disp: , Rfl:    albuterol (VENTOLIN HFA) 108 (90 Base) MCG/ACT inhaler, Inhale 2 puffs into the lungs every 4 (four) hours as needed., Disp: 54 g, Rfl: 0   ALPRAZolam (XANAX) 0.5 MG tablet, TAKE 1 TABLET BY MOUTH EVERY DAY AT BEDTIME AS NEEDED (Patient taking differently: Take 0.5 mg by mouth at bedtime.), Disp: 30 tablet, Rfl: 5   Ascorbic Acid (VITAMIN C PO), Take 1 tablet by mouth daily., Disp: , Rfl:    aspirin EC 81 MG tablet, Take 81 mg by mouth daily., Disp: , Rfl:    budesonide-formoterol (SYMBICORT) 160-4.5 MCG/ACT inhaler, INHALE TWO PUFFS BY MOUTH TWICE DAILY ** STOP SPIRIVA **, Disp: 1 each, Rfl: 6   docusate sodium (COLACE) 100 MG capsule, Take 100 mg by mouth 2 (two) times daily as needed for mild constipation., Disp: , Rfl:    dorzolamide-timolol (COSOPT) 22.3-6.8 MG/ML ophthalmic solution, Place 1 drop into both eyes 2 (two) times daily., Disp: 10 mL, Rfl: 0   ipratropium-albuterol (DUONEB) 0.5-2.5 (3) MG/3ML SOLN, USE 3 ML VIA NEBULIZER TWICE DAILY, Disp: 180 mL, Rfl: 1   mirtazapine (REMERON SOL-TAB) 15 MG disintegrating tablet, Take 1 tablet (15 mg total) by mouth at bedtime., Disp: 30 tablet, Rfl: 5   Multiple  Vitamins-Minerals (IMMUNE SUPPORT PO), Take 1 tablet by mouth daily., Disp: , Rfl:    nitroGLYCERIN (NITROSTAT) 0.4 MG SL tablet, 1 sl q 5 min prn chest pressure as directed (Patient taking differently: Place 0.4 mg under the tongue every 5 (five) minutes x 3 doses as needed for chest pain.), Disp: 50 tablet, Rfl: 3   pravastatin (PRAVACHOL) 40 MG tablet, Take 1 tablet (40 mg total) by mouth at bedtime., Disp: 90 tablet, Rfl: 1   Saw Palmetto 450 MG CAPS, Take 450 mg by mouth in the morning and at bedtime., Disp: , Rfl:   Past Medical History: Past Medical History:  Diagnosis Date   Anxiety    Cataracts, bilateral    removed by surgery   COPD (chronic obstructive pulmonary disease) (HCC)    Depression, major, single episode, moderate ([place]) 12/18/2018   Glaucoma    both eyes   Glaucoma    bilateral   H. pylori infection 09/17/2019   S/p treatment with Prevpac. Documented eradiation via breath test on 11/03/19.    Hyperlipidemia    Hypertension    Pre-diabetes    diet controlled   Skin cancer, basal cell    arms, face   Wears dentures    full    Tobacco Use: Social History   Tobacco Use  Smoking Status Former   Packs/day: 1.00   Years: 66.00   Total pack years: 66.00   Types: Cigarettes   Quit date: 11/09/2020   Years since quitting: 1.1  Smokeless  Tobacco Never  Tobacco Comments   started smoking age 10    Labs: Review Flowsheet  More data exists      Latest Ref Rng & Units 09/11/2017 01/20/2019 09/19/2020 08/21/2021  Labs for ITP Cardiac and Pulmonary Rehab  Cholestrol 100 - 199 mg/dL 141  140  171  145   LDL (calc) 0 - 99 mg/dL 71  63  98  70   HDL-C >39 mg/dL 41  37  51  54   Trlycerides 0 - 149 mg/dL 146  198  125  119   Hemoglobin A1c 4.8 - 5.6 % - - - -      11/09/2021  Labs for ITP Cardiac and Pulmonary Rehab  Cholestrol -  LDL (calc) -  HDL-C -  Trlycerides -  Hemoglobin A1c 6.0     Capillary Blood Glucose: Lab Results  Component Value Date   GLUCAP  80 12/13/2020     Pulmonary Assessment Scores:  Pulmonary Assessment Scores     Row Name 11/13/21 1252         ADL UCSD   ADL Phase Entry     SOB Score total 34     Rest 0     Walk 2     Stairs 4     Bath 1     Dress 0     Shop 2       CAT Score   CAT Score 13       mMRC Score   mMRC Score 3             UCSD: Self-administered rating of dyspnea associated with activities of daily living (ADLs) 6-point scale (0 = "not at all" to 5 = "maximal or unable to do because of breathlessness")  Scoring Scores range from 0 to 120.  Minimally important difference is 5 units  CAT: CAT can identify the health impairment of COPD patients and is better correlated with disease progression.  CAT has a scoring range of zero to 40. The CAT score is classified into four groups of low (less than 10), medium (10 - 20), high (21-30) and very high (31-40) based on the impact level of disease on health status. A CAT score over 10 suggests significant symptoms.  A worsening CAT score could be explained by an exacerbation, poor medication adherence, poor inhaler technique, or progression of COPD or comorbid conditions.  CAT MCID is 2 points  mMRC: mMRC (Modified Medical Research Council) Dyspnea Scale is used to assess the degree of baseline functional disability in patients of respiratory disease due to dyspnea. No minimal important difference is established. A decrease in score of 1 point or greater is considered a positive change.   Pulmonary Function Assessment:   Exercise Target Goals: Exercise Program Goal: Individual exercise prescription set using results from initial 6 min walk test and THRR while considering  patient's activity barriers and safety.   Exercise Prescription Goal: Initial exercise prescription builds to 30-45 minutes a day of aerobic activity, 2-3 days per week.  Home exercise guidelines will be given to patient during program as part of exercise prescription that  the participant will acknowledge.  Activity Barriers & Risk Stratification:  Activity Barriers & Cardiac Risk Stratification - 11/13/21 1253       Activity Barriers & Cardiac Risk Stratification   Activity Barriers Back Problems;Deconditioning    Cardiac Risk Stratification Low             6 Minute Walk:  [place] Name 11/13/21 1349         6 Minute Walk   Phase Initial     Distance 900 feet     Walk Time 6 minutes     # of Rest Breaks 1     MPH 1.7     METS 2.17     RPE 12     Perceived Dyspnea  11     VO2 Peak 7.58     Symptoms Yes (comment)     Comments 1 minute standing rest due to desaturation and to put on nasal canula     Resting HR 75 bpm     Resting BP 130/62     Resting Oxygen Saturation  91 %     Exercise Oxygen Saturation  during 6 min walk 81 %     Max Ex. HR 95 bpm     Max Ex. BP 150/68     2 Minute Post BP 118/68       Interval HR   1 Minute HR 88     2 Minute HR 91     3 Minute HR 88     4 Minute HR 84     5 Minute HR 91     6 Minute HR 95     2 Minute Post HR 80     Interval Heart Rate? Yes       Interval Oxygen   Interval Oxygen? Yes     Baseline Oxygen Saturation % 91 %     1 Minute Oxygen Saturation % 90 %     1 Minute Liters of Oxygen 0 L     2 Minute Oxygen Saturation % 92 %     2 Minute Liters of Oxygen 0 L     3 Minute Oxygen Saturation % 81 %     3 Minute Liters of Oxygen 0 L     4 Minute Oxygen Saturation % 88 %     4 Minute Liters of Oxygen 2 L     5 Minute Oxygen Saturation % 92 %     5 Minute Liters of Oxygen 2 L     6 Minute Oxygen Saturation % 92 %     6 Minute Liters of Oxygen 2 L     2 Minute Post Oxygen Saturation % 94 %     2 Minute Post Liters of Oxygen 2 L              Oxygen Initial Assessment:  Oxygen Initial Assessment - 11/13/21 1348       Initial 6 min Walk   Oxygen Used Continuous    Liters per minute 2      Program Oxygen Prescription   Program Oxygen Prescription Continuous     Liters per minute 2    Comments --      Intervention   Short Term Goals To learn and understand importance of monitoring SPO2 with pulse oximeter and demonstrate accurate use of the pulse oximeter.;To learn and understand importance of maintaining oxygen saturations>88%;To learn and demonstrate proper pursed lip breathing techniques or other breathing techniques.     Long  Term Goals Compliance with respiratory medication;Exhibits proper breathing techniques, such as pursed lip breathing or other method taught during program session;Maintenance of O2 saturations>88%;Verbalizes importance of monitoring SPO2 with pulse oximeter and return demonstration             Oxygen Re-Evaluation:  Oxygen Re-Evaluation     Row Name 11/22/21 0713             Program Oxygen Prescription   Program Oxygen Prescription Continuous       Liters per minute 2         Home Oxygen   Home Oxygen Device Home Concentrator;E-Tanks       Sleep Oxygen Prescription Continuous       Liters per minute 2       Home Exercise Oxygen Prescription None       Home Resting Oxygen Prescription None       Compliance with Home Oxygen Use Yes         Goals/Expected Outcomes   Short Term Goals To learn and understand importance of monitoring SPO2 with pulse oximeter and demonstrate accurate use of the pulse oximeter.;To learn and understand importance of maintaining oxygen saturations>88%;To learn and demonstrate proper pursed lip breathing techniques or other breathing techniques.        Long  Term Goals Compliance with respiratory medication;Exhibits proper breathing techniques, such as pursed lip breathing or other method taught during program session;Maintenance of O2 saturations>88%;Verbalizes importance of monitoring SPO2 with pulse oximeter and return demonstration       Goals/Expected Outcomes compliance                Oxygen Discharge (Final Oxygen Re-Evaluation):  Oxygen Re-Evaluation - 11/22/21 0713        Program Oxygen Prescription   Program Oxygen Prescription Continuous    Liters per minute 2      Home Oxygen   Home Oxygen Device Home Concentrator;E-Tanks    Sleep Oxygen Prescription Continuous    Liters per minute 2    Home Exercise Oxygen Prescription None    Home Resting Oxygen Prescription None    Compliance with Home Oxygen Use Yes      Goals/Expected Outcomes   Short Term Goals To learn and understand importance of monitoring SPO2 with pulse oximeter and demonstrate accurate use of the pulse oximeter.;To learn and understand importance of maintaining oxygen saturations>88%;To learn and demonstrate proper pursed lip breathing techniques or other breathing techniques.     Long  Term Goals Compliance with respiratory medication;Exhibits proper breathing techniques, such as pursed lip breathing or other method taught during program session;Maintenance of O2 saturations>88%;Verbalizes importance of monitoring SPO2 with pulse oximeter and return demonstration    Goals/Expected Outcomes compliance             Initial Exercise Prescription:  Initial Exercise Prescription - 11/13/21 1300       Date of Initial Exercise RX and Referring Provider   Date 11/13/21    Referring Provider Dr. Elsworth Soho    Expected Discharge Date 03/20/22      Oxygen   Oxygen Continuous    Liters 2    Maintain Oxygen Saturation 88% or higher      Treadmill   MPH 1    Grade 0    Minutes 17      NuStep   Level 1    SPM 80    Minutes 22      Prescription Details   Frequency (times per week) 2    Duration Progress to 30 minutes of continuous aerobic without signs/symptoms of physical distress      Intensity   THRR 40-80% of Max Heartrate 55-110    Ratings of Perceived Exertion 11-13    Perceived Dyspnea 0-4      Resistance Training  Training Prescription Yes    Weight 3    Reps 10-15             Perform Capillary Blood Glucose checks as needed.  Exercise Prescription  Changes:   Exercise Prescription Changes     Row Name 11/21/21 1500 12/05/21 1500 12/12/21 1500 12/19/21 1500       Response to Exercise   Blood Pressure (Admit) 120/60 132/78 -- 118/72    Blood Pressure (Exercise) 118/74 140/78 -- 132/62    Blood Pressure (Exit) 110/68 -- -- 138/72    Heart Rate (Admit) 74 bpm 78 bpm -- 76 bpm    Heart Rate (Exercise) 87 bpm 95 bpm -- 91 bpm    Heart Rate (Exit) 71 bpm -- -- 70 bpm    Oxygen Saturation (Admit) 92 % 90 % -- 92 %    Oxygen Saturation (Exercise) 92 % 92 % -- 90 %    Oxygen Saturation (Exit) 97 % -- -- 90 %    Rating of Perceived Exertion (Exercise) 12 12 -- 12    Perceived Dyspnea (Exercise) 12 12 -- 12    Duration Continue with 30 min of aerobic exercise without signs/symptoms of physical distress. Continue with 30 min of aerobic exercise without signs/symptoms of physical distress. -- Continue with 30 min of aerobic exercise without signs/symptoms of physical distress.    Intensity THRR unchanged THRR unchanged -- THRR unchanged      Progression   Progression Continue to progress workloads to maintain intensity without signs/symptoms of physical distress. Continue to progress workloads to maintain intensity without signs/symptoms of physical distress. -- Continue to progress workloads to maintain intensity without signs/symptoms of physical distress.      Resistance Training   Training Prescription Yes Yes -- Yes    Weight 3 4 -- 4    Reps 10-15 10-15 -- 10-15    Time 10 Minutes 10 Minutes -- 10 Minutes      Oxygen   Oxygen Continuous Continuous -- Continuous    Liters 2 2 -- 2      Treadmill   MPH 1 1.5 -- 1.6    Grade 0 0 -- 0    Minutes 22 22 -- 22    METs 1.77 2.64 -- 2.23      NuStep   Level 1 1 -- 1    SPM 83 87 -- 79    Minutes 17 17 -- 17    METs 1.9 1.9 -- 1.9      Home Exercise Plan   Plans to continue exercise at -- -- Home (comment) --    Frequency -- -- Add 3 additional days to program exercise sessions.  --    Initial Home Exercises Provided -- -- 12/12/21 --      Oxygen   Maintain Oxygen Saturation 88% or higher -- -- --             Exercise Comments:   Exercise Comments     Row Name 12/12/21 1537           Exercise Comments home exercised reviewed                Exercise Goals and Review:   Exercise Goals     Row Name 11/13/21 1355 11/22/21 0716 12/19/21 1500         Exercise Goals   Increase Physical Activity Yes Yes Yes     Intervention Provide advice, education, support and counseling about physical activity/exercise needs.;Develop  an individualized exercise prescription for aerobic and resistive training based on initial evaluation findings, risk stratification, comorbidities and participant's personal goals. Provide advice, education, support and counseling about physical activity/exercise needs.;Develop an individualized exercise prescription for aerobic and resistive training based on initial evaluation findings, risk stratification, comorbidities and participant's personal goals. Provide advice, education, support and counseling about physical activity/exercise needs.;Develop an individualized exercise prescription for aerobic and resistive training based on initial evaluation findings, risk stratification, comorbidities and participant's personal goals.     Expected Outcomes Short Term: Attend rehab on a regular basis to increase amount of physical activity.;Long Term: Add in home exercise to make exercise part of routine and to increase amount of physical activity.;Long Term: Exercising regularly at least 3-5 days a week. Short Term: Attend rehab on a regular basis to increase amount of physical activity.;Long Term: Add in home exercise to make exercise part of routine and to increase amount of physical activity.;Long Term: Exercising regularly at least 3-5 days a week. Short Term: Attend rehab on a regular basis to increase amount of physical activity.;Long Term:  Add in home exercise to make exercise part of routine and to increase amount of physical activity.;Long Term: Exercising regularly at least 3-5 days a week.     Increase Strength and Stamina Yes Yes Yes     Intervention Provide advice, education, support and counseling about physical activity/exercise needs.;Develop an individualized exercise prescription for aerobic and resistive training based on initial evaluation findings, risk stratification, comorbidities and participant's personal goals. Provide advice, education, support and counseling about physical activity/exercise needs.;Develop an individualized exercise prescription for aerobic and resistive training based on initial evaluation findings, risk stratification, comorbidities and participant's personal goals. Provide advice, education, support and counseling about physical activity/exercise needs.;Develop an individualized exercise prescription for aerobic and resistive training based on initial evaluation findings, risk stratification, comorbidities and participant's personal goals.     Expected Outcomes Short Term: Increase workloads from initial exercise prescription for resistance, speed, and METs.;Short Term: Perform resistance training exercises routinely during rehab and add in resistance training at home;Long Term: Improve cardiorespiratory fitness, muscular endurance and strength as measured by increased METs and functional capacity (6MWT) Short Term: Increase workloads from initial exercise prescription for resistance, speed, and METs.;Short Term: Perform resistance training exercises routinely during rehab and add in resistance training at home;Long Term: Improve cardiorespiratory fitness, muscular endurance and strength as measured by increased METs and functional capacity (6MWT) Short Term: Increase workloads from initial exercise prescription for resistance, speed, and METs.;Short Term: Perform resistance training exercises routinely  during rehab and add in resistance training at home;Long Term: Improve cardiorespiratory fitness, muscular endurance and strength as measured by increased METs and functional capacity (6MWT)     Able to understand and use rate of perceived exertion (RPE) scale Yes Yes Yes     Intervention Provide education and explanation on how to use RPE scale Provide education and explanation on how to use RPE scale Provide education and explanation on how to use RPE scale     Expected Outcomes Short Term: Able to use RPE daily in rehab to express subjective intensity level;Long Term:  Able to use RPE to guide intensity level when exercising independently Short Term: Able to use RPE daily in rehab to express subjective intensity level;Long Term:  Able to use RPE to guide intensity level when exercising independently Short Term: Able to use RPE daily in rehab to express subjective intensity level;Long Term:  Able to use RPE to guide intensity  level when exercising independently     Able to understand and use Dyspnea scale Yes Yes Yes     Intervention Provide education and explanation on how to use Dyspnea scale Provide education and explanation on how to use Dyspnea scale Provide education and explanation on how to use Dyspnea scale     Expected Outcomes Short Term: Able to use Dyspnea scale daily in rehab to express subjective sense of shortness of breath during exertion;Long Term: Able to use Dyspnea scale to guide intensity level when exercising independently Short Term: Able to use Dyspnea scale daily in rehab to express subjective sense of shortness of breath during exertion;Long Term: Able to use Dyspnea scale to guide intensity level when exercising independently Short Term: Able to use Dyspnea scale daily in rehab to express subjective sense of shortness of breath during exertion;Long Term: Able to use Dyspnea scale to guide intensity level when exercising independently     Knowledge and understanding of Target  Heart Rate Range (THRR) Yes Yes Yes     Intervention Provide education and explanation of THRR including how the numbers were predicted and where they are located for reference Provide education and explanation of THRR including how the numbers were predicted and where they are located for reference Provide education and explanation of THRR including how the numbers were predicted and where they are located for reference     Expected Outcomes Short Term: Able to state/look up THRR;Long Term: Able to use THRR to govern intensity when exercising independently;Short Term: Able to use daily as guideline for intensity in rehab Short Term: Able to state/look up THRR;Long Term: Able to use THRR to govern intensity when exercising independently;Short Term: Able to use daily as guideline for intensity in rehab Short Term: Able to state/look up THRR;Long Term: Able to use THRR to govern intensity when exercising independently;Short Term: Able to use daily as guideline for intensity in rehab     Understanding of Exercise Prescription Yes Yes Yes     Intervention Provide education, explanation, and written materials on patient's individual exercise prescription Provide education, explanation, and written materials on patient's individual exercise prescription Provide education, explanation, and written materials on patient's individual exercise prescription     Expected Outcomes Long Term: Able to explain home exercise prescription to exercise independently;Short Term: Able to explain program exercise prescription Long Term: Able to explain home exercise prescription to exercise independently;Short Term: Able to explain program exercise prescription Long Term: Able to explain home exercise prescription to exercise independently;Short Term: Able to explain program exercise prescription              Exercise Goals Re-Evaluation :  Exercise Goals Re-Evaluation     [place] Name 11/22/21 0716 12/19/21 1500            Exercise Goal Re-Evaluation   Exercise Goals Review Increase Physical Activity;Able to understand and use Dyspnea scale;Understanding of Exercise Prescription;Knowledge and understanding of Target Heart Rate Range (THRR);Increase Strength and Stamina;Able to understand and use rate of perceived exertion (RPE) scale Increase Physical Activity;Increase Strength and Stamina;Able to understand and use rate of perceived exertion (RPE) scale;Able to understand and use Dyspnea scale;Knowledge and understanding of Target Heart Rate Range (THRR);Understanding of Exercise Prescription      Comments Pt has completed 2 exercise sessions of PR. He is motivated to improve himself. He has been 1 week cigarette free and reports that he is feeling much better due to this. He is currently exercising at 1.9 METs  on the stepper. Will continue to monitor and progress as able. Pt has completed 10 sessions of PR. He continues to be motivated when in class and is pushing himself. He is currently exercising at 2.23 METs on the treadmill. Will monitor and progress as able.      Expected Outcomes Through exercise at rehab and at home, the patient will meet their stated goals. Through exercise at rehab and at home, the patient will meet their stated goals.               Discharge Exercise Prescription (Final Exercise Prescription Changes):  Exercise Prescription Changes - 12/19/21 1500       Response to Exercise   Blood Pressure (Admit) 118/72    Blood Pressure (Exercise) 132/62    Blood Pressure (Exit) 138/72    Heart Rate (Admit) 76 bpm    Heart Rate (Exercise) 91 bpm    Heart Rate (Exit) 70 bpm    Oxygen Saturation (Admit) 92 %    Oxygen Saturation (Exercise) 90 %    Oxygen Saturation (Exit) 90 %    Rating of Perceived Exertion (Exercise) 12    Perceived Dyspnea (Exercise) 12    Duration Continue with 30 min of aerobic exercise without signs/symptoms of physical distress.    Intensity THRR unchanged       Progression   Progression Continue to progress workloads to maintain intensity without signs/symptoms of physical distress.      Resistance Training   Training Prescription Yes    Weight 4    Reps 10-15    Time 10 Minutes      Oxygen   Oxygen Continuous    Liters 2      Treadmill   MPH 1.6    Grade 0    Minutes 22    METs 2.23      NuStep   Level 1    SPM 79    Minutes 17    METs 1.9             Nutrition:  Target Goals: Understanding of nutrition guidelines, daily intake of sodium '1500mg'$ , cholesterol '200mg'$ , calories 30% from fat and 7% or less from saturated fats, daily to have 5 or more servings of fruits and vegetables.  Biometrics:  Pre Biometrics - 11/13/21 1355       Pre Biometrics   Height '5\' 8"'$  (1.727 m)    Weight 56.3 kg    Waist Circumference 35.5 inches    Hip Circumference 36 inches    Waist to Hip Ratio 0.99 %    BMI (Calculated) 18.88    Triceps Skinfold 16 mm    % Body Fat 23.1 %    Grip Strength 27.6 kg    Flexibility 0 in    Single Leg Stand 8 seconds              Nutrition Therapy Plan and Nutrition Goals:  Nutrition Therapy & Goals - 11/20/21 [id number]       Personal Nutrition Goals   Comments Patient scored 36 on his diet assessment. We offer 2 educational sessions on heart healthy nutrition with handouts.      Intervention Plan   Intervention Nutrition handout(s) given to patient.    Expected Outcomes Short Term Goal: Understand basic principles of dietary content, such as calories, fat, sodium, cholesterol and nutrients.             Nutrition Assessments:  Nutrition Assessments - 11/13/21 1300  MEDFICTS Scores   Pre Score 36            MEDIFICTS Score Key: ?70 Need to make dietary changes  40-70 Heart Healthy Diet ? 40 Therapeutic Level Cholesterol Diet   Picture Your Plate Scores: <78 Unhealthy dietary pattern with much room for improvement. 41-50 Dietary pattern unlikely to meet recommendations  for good health and room for improvement. 51-60 More healthful dietary pattern, with some room for improvement.  >60 Healthy dietary pattern, although there may be some specific behaviors that could be improved.    Nutrition Goals Re-Evaluation:  Nutrition Goals Re-Evaluation     [place] Name 12/15/21 1032             Goals   Comment We provide 2 educational sessions on heart healthy nutrition with handouts and assistance with RD referrals if patient is interested.       Expected Outcome Handout provided and explained regarding heathier choices.  Patient verbalized understanding.                Nutrition Goals Discharge (Final Nutrition Goals Re-Evaluation):  Nutrition Goals Re-Evaluation - 12/15/21 1032       Goals   Comment We provide 2 educational sessions on heart healthy nutrition with handouts and assistance with RD referrals if patient is interested.    Expected Outcome Handout provided and explained regarding heathier choices.  Patient verbalized understanding.             Psychosocial: Target Goals: Acknowledge presence or absence of significant depression and/or stress, maximize coping skills, provide positive support system. Participant is able to verbalize types and ability to use techniques and skills needed for reducing stress and depression.  Initial Review & Psychosocial Screening:  Initial Psych Review & Screening - 11/13/21 1406       Initial Review   Current issues with Current Anxiety/Panic;Current Depression      Family Dynamics   Good Support System? Yes    Comments His support system is his friend, Terri Piedra. He reports having a good relationship with his daughter and ex wife as well.      Barriers   Psychosocial barriers to participate in program The patient should benefit from training in stress management and relaxation.      Screening Interventions   Interventions Encouraged to exercise    Expected Outcomes Long Term goal: The participant  improves quality of Life and PHQ9 Scores as seen by post scores and/or verbalization of changes;Short Term goal: Identification and review with participant of any Quality of Life or Depression concerns found by scoring the questionnaire.             Quality of Life Scores:  Quality of Life - 11/13/21 1356       Quality of Life   Select Quality of Life      Quality of Life Scores   Health/Function Pre 19.96 %    Socioeconomic Pre 25.75 %    Psych/Spiritual Pre 29 %    Family Pre 30 %    GLOBAL Pre 24.57 %            Scores of 19 and below usually indicate a poorer quality of life in these areas.  A difference of  2-3 points is a clinically meaningful difference.  A difference of 2-3 points in the total score of the Quality of Life Index has been associated with significant improvement in overall quality of life, self-image, physical symptoms, and general health in studies  assessing change in quality of life.   PHQ-9: Review Flowsheet  More data exists      11/13/2021 04/25/2021 01/23/2021 12/09/2020 09/12/2020  Depression screen PHQ 2/9  Decreased Interest 0 1 2 0 0  Down, Depressed, Hopeless 0 1 0 0 0  PHQ - 2 Score 0 2 2 0 0  Altered sleeping 0 0 0 - 0  Tired, decreased energy '1 1 1 '$ - 0  Change in appetite '1 2 1 '$ - 3  Feeling bad or failure about yourself  0 0 0 - 0  Trouble concentrating 0 0 0 - 0  Moving slowly or fidgety/restless 0 0 0 - 0  Suicidal thoughts 0 0 0 - 0  PHQ-9 Score '2 5 4 '$ - 3  Difficult doing work/chores Not difficult at all Not difficult at all Not difficult at all - Not difficult at all   Interpretation of Total Score  Total Score Depression Severity:  1-4 = Minimal depression, 5-9 = Mild depression, 10-14 = Moderate depression, 15-19 = Moderately severe depression, 20-27 = Severe depression   Psychosocial Evaluation and Intervention:  Psychosocial Evaluation - 11/13/21 1358       Psychosocial Evaluation & Interventions   Interventions  Encouraged to exercise with the program and follow exercise prescription;Relaxation education;Stress management education    Comments Pt has no barriers to participating in PR. He has no identifiable psychosocial issues, but he is on mirtazapine 15 mg and alprazolam 0.5 mg for depression and anxiety. When questioned about his anxiety and depression he denies having both, and is reports that Dr. Wolfgang Phoenix put him on these about 4 years ago. He states that he is on these medications because he is "hyperactive and nervous". He scored a 2 on his PHQ-9 and this relates to his lack of energy from his lung disease and his poor appetite. He has been trying for some time to gain weight, but has been unable to do so. He has smoked cigarettes for about 70 years ranging from 1/4 pack per day to a pack per day. He has recently been smoking 1/4 pack per day. He reports that he quit smoking as of today. He has tried to quit smoking over 20 times but has been able to do so. He is using nicotine lozenges to aid his smoking cessation. He is motivated to quit and is hopeful that this will finally be the time he is able to quit for good. He reports that he has a good support system. He has a group of friends that he keeps up with. Both of his children and his ex-wife live in [place], and he reports that he speaks with them frequently. His goals while in the program are to stay cigarette free and to decrease his SOB with exertion. He is eager to begin the program.    Expected Outcomes Pt will continue to have no identifiable psychosocial issues and his anxiety and depression will continue to be treated.    Continue Psychosocial Services  No Follow up required             Psychosocial Re-Evaluation:  Psychosocial Re-Evaluation     [place] Name 11/20/21 0729 12/12/21 0747 12/14/21 0824 12/15/21 1019       Psychosocial Re-Evaluation   Current issues with History of Depression History of Depression -- History of Depression     Comments Patient is new to the program completing 2 sessions. He does have a history of major depression and anxiety which is managed  with mirtazapine and Alprazolam. He seems to enjoy coming to the program and demonstrates an interest in improving his health. We will continue to monitor his progress. Patient is new to the program completing 8 sessions. He does have a history of major depression and anxiety which is managed with mirtazapine and Alprazolam. He seems to enjoy coming to the program and demonstrates an interest in improving his health. We will continue to monitor his progress. Patient completed 8 sessions. He does have a history of major depression and anxiety which is managed with mirtazapine and Alprazolam. He seems to enjoy coming to the program and demonstrates an interest in improving his health. We will continue to monitor his progress. Patient has completed 9 sessions. He does have a history of major depression and anxiety which is managed with mirtazapine and Alprazolam. He seems to enjoy coming to the program and demonstrates an interest in improving his health with a positive outlook.  We will continue to monitor his progress as he progress in the program.    Expected Outcomes Patient will continue to have no psychosocial barriers identified and his depression and anxiety will continue to be managed. Patient will continue to have no psychosocial barriers identified and his depression and anxiety will continue to be managed. -- Patient will continue to have no psychosocial barriers identified at discharge.    Interventions Stress management education;Encouraged to attend Pulmonary Rehabilitation for the exercise;Relaxation education Stress management education;Encouraged to attend Pulmonary Rehabilitation for the exercise;Relaxation education -- Stress management education;Encouraged to attend Pulmonary Rehabilitation for the exercise;Relaxation education    Continue Psychosocial Services   No Follow up required No Follow up required -- No Follow up required             Psychosocial Discharge (Final Psychosocial Re-Evaluation):  Psychosocial Re-Evaluation - 12/15/21 1019       Psychosocial Re-Evaluation   Current issues with History of Depression    Comments Patient has completed 9 sessions. He does have a history of major depression and anxiety which is managed with mirtazapine and Alprazolam. He seems to enjoy coming to the program and demonstrates an interest in improving his health with a positive outlook.  We will continue to monitor his progress as he progress in the program.    Expected Outcomes Patient will continue to have no psychosocial barriers identified at discharge.    Interventions Stress management education;Encouraged to attend Pulmonary Rehabilitation for the exercise;Relaxation education    Continue Psychosocial Services  No Follow up required              Education: Education Goals: Education classes will be provided on a weekly basis, covering required topics. Participant will state understanding/return demonstration of topics presented.  Learning Barriers/Preferences:  Learning Barriers/Preferences - 11/13/21 1301       Learning Barriers/Preferences   Learning Barriers None    Learning Preferences Audio;Computer/Internet;Group Instruction;Individual Instruction;Pictoral;Skilled Demonstration;Verbal Instruction;Video;Written Material             Education Topics: How Lungs Work and Diseases: - Discuss the anatomy of the lungs and diseases that can affect the lungs, such as COPD.   Exercise: -Discuss the importance of exercise, FITT principles of exercise, normal and abnormal responses to exercise, and how to exercise safely.   Environmental Irritants: -Discuss types of environmental irritants and how to limit exposure to environmental irritants.   Meds/Inhalers and oxygen: - Discuss respiratory medications, definition of an  inhaler and oxygen, and the proper way to use an inhaler  and oxygen.   Energy Saving Techniques: - Discuss methods to conserve energy and decrease shortness of breath when performing activities of daily living.    Bronchial Hygiene / Breathing Techniques: - Discuss breathing mechanics, pursed-lip breathing technique,  proper posture, effective ways to clear airways, and other functional breathing techniques   Cleaning Equipment: - Provides group verbal and written instruction about the health risks of elevated stress, cause of high stress, and healthy ways to reduce stress.   Nutrition I: Fats: - Discuss the types of cholesterol, what cholesterol does to the body, and how cholesterol levels can be controlled. Flowsheet Row PULMONARY REHAB OTHER RESPIRATORY from 12/07/2021 in [place]  Date 11/16/21  Educator DF  Instruction Review Code 1- Verbalizes Understanding       Nutrition II: Labels: -Discuss the different components of food labels and how to read food labels. Flowsheet Row PULMONARY REHAB OTHER RESPIRATORY from 12/07/2021 in [place]  Date 11/23/21  Educator DF  Instruction Review Code 1- Verbalizes Understanding       Respiratory Infections: - Discuss the signs and symptoms of respiratory infections, ways to prevent respiratory infections, and the importance of seeking medical treatment when having a respiratory infection. Flowsheet Row PULMONARY REHAB OTHER RESPIRATORY from 12/07/2021 in [place]  Date 11/30/21  Educator [place]  Instruction Review Code 1- Verbalizes Understanding       Stress I: Signs and Symptoms: - Discuss the causes of stress, how stress may lead to anxiety and depression, and ways to limit stress. Flowsheet Row PULMONARY REHAB OTHER RESPIRATORY from 12/07/2021 in [place]  Date 12/07/21  Educator [place]  Instruction Review Code 1- Verbalizes  Understanding       Stress II: Relaxation: -Discuss relaxation techniques to limit stress.   Oxygen for Home/Travel: - Discuss how to prepare for travel when on oxygen and proper ways to transport and store oxygen to ensure safety.   Knowledge Questionnaire Score:  Knowledge Questionnaire Score - 11/13/21 1302       Knowledge Questionnaire Score   Pre Score 9/18             Core Components/Risk Factors/Patient Goals at Admission:  Personal Goals and Risk Factors at Admission - 11/13/21 1304       Core Components/Risk Factors/Patient Goals on Admission   Tobacco Cessation Yes    Number of packs per day 1/4 recently, but quit 11/13/2021    Intervention Assist the participant in steps to quit. Provide individualized education and counseling about committing to Tobacco Cessation, relapse prevention, and pharmacological support that can be provided by physician.;Advice worker, assist with locating and accessing local/national Quit Smoking programs, and support quit date choice.    Expected Outcomes Long Term: Complete abstinence from all tobacco products for at least 12 months from quit date.;Short Term: Will quit all tobacco product use, adhering to prevention of relapse plan.;Short Term: Will demonstrate readiness to quit, by selecting a quit date.    Improve shortness of breath with ADL's Yes    Intervention Provide education, individualized exercise plan and daily activity instruction to help decrease symptoms of SOB with activities of daily living.    Expected Outcomes Short Term: Improve cardiorespiratory fitness to achieve a reduction of symptoms when performing ADLs;Long Term: Be able to perform more ADLs without symptoms or delay the onset of symptoms    Hypertension Yes    Intervention Provide education on lifestyle modifcations including regular physical activity/exercise,  weight management, moderate sodium restriction and increased consumption of fresh  fruit, vegetables, and low fat dairy, alcohol moderation, and smoking cessation.;Monitor prescription use compliance.    Expected Outcomes Short Term: Continued assessment and intervention until BP is < 140/7m HG in hypertensive participants. < 130/873mHG in hypertensive participants with diabetes, heart failure or chronic kidney disease.;Long Term: Maintenance of blood pressure at goal levels.    Lipids Yes    Intervention Provide education and support for participant on nutrition & aerobic/resistive exercise along with prescribed medications to achieve LDL '70mg'$ , HDL >'40mg'$ .    Expected Outcomes Short Term: Participant states understanding of desired cholesterol values and is compliant with medications prescribed. Participant is following exercise prescription and nutrition guidelines.;Long Term: Cholesterol controlled with medications as prescribed, with individualized exercise RX and with personalized nutrition plan. Value goals: LDL < '70mg'$ , HDL > 40 mg.             Core Components/Risk Factors/Patient Goals Review:   Goals and Risk Factor Review     Row Name 11/20/21 0733 12/12/21 0748 12/15/21 1037         Core Components/Risk Factors/Patient Goals Review   Personal Goals Review Tobacco Cessation;Weight Management/Obesity;Improve shortness of breath with ADL's;Lipids;Hypertension;Other Tobacco Cessation;Weight Management/Obesity;Improve shortness of breath with ADL's;Lipids;Hypertension;Other Tobacco Cessation;Weight Management/Obesity;Improve shortness of breath with ADL's;Lipids;Hypertension;Other     Review Patient was referred to PR wtih Centrilobuler emphysema and chronic respiratory failure. He has completed 1 session exercising on 2L with oxygen saturations running 95%. His personal goals for the program are to decrease his SOB with exertion and continue to abstain from smoking. We will continue to monitor his progress as he works towards meeting these goals. Patient was referred  to PR wtih Centrilobuler emphysema and chronic respiratory failure. He has completed 8 session exercising on 2L with oxygen saturations running 95%. His personal goals for the program are to decrease his SOB with exertion and continue to abstain from smoking. We will continue to monitor his progress as he works towards meeting these goals. Patient has completed  8 session.  He is exercising on 2L with oxygen saturations running 90%-95%. We encourage pursed lipped breathing technique. He tolerated using the nu-step and treadmill without difficulty.  His personal goals for the program are to decrease his SOB with exertion and continue to abstain from smoking. We will continue to monitor his progress as he works towards meeting these goals.     Expected Outcomes Patient will complete the program meeting both personal and program goals. Patient will complete the program meeting both personal and program goals. Patient will complete the program meeting both personal and program goals.              Core Components/Risk Factors/Patient Goals at Discharge (Final Review):   Goals and Risk Factor Review - 12/15/21 1037       Core Components/Risk Factors/Patient Goals Review   Personal Goals Review Tobacco Cessation;Weight Management/Obesity;Improve shortness of breath with ADL's;Lipids;Hypertension;Other    Review Patient has completed  8 session.  He is exercising on 2L with oxygen saturations running 90%-95%. We encourage pursed lipped breathing technique. He tolerated using the nu-step and treadmill without difficulty.  His personal goals for the program are to decrease his SOB with exertion and continue to abstain from smoking. We will continue to monitor his progress as he works towards meeting these goals.    Expected Outcomes Patient will complete the program meeting both personal and program goals.  ITP Comments:   Comments: ITP REVIEW Pt is making expected progress toward  pulmonary rehab goals after completing 11 sessions. Recommend continued exercise, life style modification, education, and utilization of breathing techniques to increase stamina and strength and decrease shortness of breath with exertion.

## 2021-12-21 ENCOUNTER — Encounter (HOSPITAL_COMMUNITY): Payer: PPO

## 2021-12-26 ENCOUNTER — Encounter (HOSPITAL_COMMUNITY)
Admission: RE | Admit: 2021-12-26 | Discharge: 2021-12-26 | Disposition: A | Discharge status: Home / Self Care | Payer: PPO | Source: Ambulatory Visit | Attending: Pulmonary Disease | Admitting: Pulmonary Disease

## 2021-12-26 DIAGNOSIS — J432 Centrilobular emphysema: Secondary | ICD-10-CM

## 2021-12-26 DIAGNOSIS — J9611 Chronic respiratory failure with hypoxia: Secondary | ICD-10-CM | POA: Diagnosis not present

## 2021-12-26 NOTE — Progress Notes (Signed)
Daily Session Note  Patient Details  Name: George Meyer MRN: [id number] Date of Birth: [birth year] Referring Provider:   Flowsheet Row PULMONARY REHAB OTHER RESP ORIENTATION from 11/13/2021 in [facility] CARDIAC REHABILITATION  Referring Provider Dr. Vassie Loll       Encounter Date: 12/26/2021  Check In:  Session Check In - 12/26/21 1454       Check-In   Supervising physician immediately available to respond to emergencies [facility] MD immediately available    Physician(s) Dr Wyline Mood    Location [facility]    Staff Present Cristopher Peru, MS, ACSM-CEP, Exercise Physiologist;Heather Patty Sermons, Exercise Physiologist;Emmry Hinsch Daphine Deutscher, RN, BSN;Phyllis Billingsley, RN    Virtual Visit No    Medication changes reported     No    Fall or balance concerns reported    No    Tobacco Cessation No Change    Warm-up and Cool-down Performed as group-led instruction    Resistance Training Performed Yes    VAD Patient? No    PAD/SET Patient? No      Pain Assessment   Currently in Pain? No/denies    Multiple Pain Sites No             Capillary Blood Glucose: No results found for this or any previous visit (from the past 24 hour(s)).    Social History   Tobacco Use  Smoking Status Former   Packs/day: 1.00   Years: 66.00   Total pack years: 66.00   Types: Cigarettes   Quit date: 11/09/2020   Years since quitting: 1.1  Smokeless Tobacco Never  Tobacco Comments   started smoking age 68    Goals Met:  Independence with exercise equipment Using PLB without cueing & demonstrates good technique Exercise tolerated well Queuing for purse lip breathing No report of concerns or symptoms today Strength training completed today  Goals Unmet:  Not Applicable  Comments: checkout at 1600.   Dr. Erick Blinks is Medical Director for [facility] Pulmonary Rehab.

## 2021-12-28 ENCOUNTER — Encounter (HOSPITAL_COMMUNITY)
Admission: RE | Admit: 2021-12-28 | Discharge: 2021-12-28 | Disposition: A | Discharge status: Home / Self Care | Payer: PPO | Source: Ambulatory Visit | Attending: Pulmonary Disease | Admitting: Pulmonary Disease

## 2021-12-28 DIAGNOSIS — J9611 Chronic respiratory failure with hypoxia: Secondary | ICD-10-CM | POA: Diagnosis not present

## 2021-12-28 DIAGNOSIS — J432 Centrilobular emphysema: Secondary | ICD-10-CM

## 2021-12-28 NOTE — Progress Notes (Signed)
Daily Session Note  Patient Details  Name: CHRISTERPHER CLOS MRN: [id number] Date of Birth: 10-26-1940 Referring Provider:   Flowsheet Row PULMONARY REHAB OTHER RESP ORIENTATION from 11/13/2021 in [place]  Referring Provider Dr. Elsworth Soho       Encounter Date: 12/28/2021  Check In:  Session Check In - 12/28/21 1456       Check-In   Supervising physician immediately available to respond to emergencies [facility] MD immediately available    Physician(s) Dr Debara Pickett    Location [facility]    Staff Present Redge Gainer, BS, Exercise Physiologist;Emma Birchler Hassell Done, RN, BSN;Phyllis Billingsley, RN    Virtual Visit No    Medication changes reported     No    Fall or balance concerns reported    No    Tobacco Cessation No Change    Warm-up and Cool-down Performed as group-led instruction    Resistance Training Performed Yes    VAD Patient? No    PAD/SET Patient? No      Pain Assessment   Currently in Pain? No/denies    Multiple Pain Sites No             Capillary Blood Glucose: No results found for this or any previous visit (from the past 24 hour(s)).    Social History   Tobacco Use  Smoking Status Former   Packs/day: 1.00   Years: 66.00   Total pack years: 66.00   Types: Cigarettes   Quit date: 11/09/2020   Years since quitting: 1.1  Smokeless Tobacco Never  Tobacco Comments   started smoking age 60    Goals Met:  Proper associated with RPD/PD & O2 Sat Independence with exercise equipment Using PLB without cueing & demonstrates good technique Exercise tolerated well No report of concerns or symptoms today Strength training completed today  Goals Unmet:  Not Applicable  Comments: Checkout at 1600.   Dr. Kathie Dike is Medical Director for [facility] Pulmonary Rehab.

## 2022-01-02 ENCOUNTER — Encounter (HOSPITAL_COMMUNITY): Payer: PPO

## 2022-01-04 ENCOUNTER — Encounter (HOSPITAL_COMMUNITY)
Admission: RE | Admit: 2022-01-04 | Discharge: 2022-01-04 | Disposition: A | Discharge status: Home / Self Care | Payer: PPO | Source: Ambulatory Visit | Attending: Pulmonary Disease | Admitting: Pulmonary Disease

## 2022-01-04 VITALS — Wt 129.6 lb

## 2022-01-04 DIAGNOSIS — J432 Centrilobular emphysema: Secondary | ICD-10-CM | POA: Insufficient documentation

## 2022-01-04 DIAGNOSIS — J9611 Chronic respiratory failure with hypoxia: Secondary | ICD-10-CM | POA: Diagnosis present

## 2022-01-04 DIAGNOSIS — J9612 Chronic respiratory failure with hypercapnia: Secondary | ICD-10-CM | POA: Insufficient documentation

## 2022-01-04 NOTE — Progress Notes (Signed)
Daily Session Note  Patient Details  Name: George Meyer MRN: [id number] Date of Birth: 1941-06-26 Referring Provider:   Flowsheet Row PULMONARY REHAB OTHER RESP ORIENTATION from 11/13/2021 in [place]  Referring Provider Dr. Elsworth Soho       Encounter Date: 01/04/2022  Check In:  Session Check In - 01/04/22 1500       Check-In   Supervising physician immediately available to respond to emergencies [facility] MD immediately available    Physician(s) Dr. Johney Frame    Location [facility]    Staff Present Geanie Cooley, RN;Heather Otho Ket, BS, Exercise Physiologist;Debra Wynetta Emery, RN, Bjorn Loser, MS, ACSM-CEP, Exercise Physiologist    Virtual Visit No    Medication changes reported     No    Fall or balance concerns reported    No    Tobacco Cessation No Change    Warm-up and Cool-down Performed as group-led instruction    Resistance Training Performed Yes    VAD Patient? No    PAD/SET Patient? No      Pain Assessment   Currently in Pain? No/denies    Multiple Pain Sites No             Capillary Blood Glucose: No results found for this or any previous visit (from the past 24 hour(s)).    Social History   Tobacco Use  Smoking Status Former   Packs/day: 1.00   Years: 66.00   Total pack years: 66.00   Types: Cigarettes   Quit date: 11/09/2020   Years since quitting: 1.1  Smokeless Tobacco Never  Tobacco Comments   started smoking age 22    Goals Met:  Proper associated with RPD/PD & O2 Sat Independence with exercise equipment Improved SOB with ADL's Exercise tolerated well No report of concerns or symptoms today Strength training completed today  Goals Unmet:  Not Applicable  Comments: check out @ 4:00pm   Dr. Kathie Dike is Medical Director for [facility] Pulmonary Rehab.

## 2022-01-09 ENCOUNTER — Encounter (HOSPITAL_COMMUNITY)
Admission: RE | Admit: 2022-01-09 | Discharge: 2022-01-09 | Disposition: A | Discharge status: Home / Self Care | Payer: PPO | Source: Ambulatory Visit | Attending: Pulmonary Disease | Admitting: Pulmonary Disease

## 2022-01-09 DIAGNOSIS — J432 Centrilobular emphysema: Secondary | ICD-10-CM

## 2022-01-09 DIAGNOSIS — J9611 Chronic respiratory failure with hypoxia: Secondary | ICD-10-CM

## 2022-01-09 NOTE — Progress Notes (Signed)
Daily Session Note  Patient Details  Name: George Meyer MRN: [id number] Date of Birth: 1941/01/04 Referring Provider:   Flowsheet Row PULMONARY REHAB OTHER RESP ORIENTATION from 11/13/2021 in [facility]  Referring Provider Dr. Elsworth Soho       Encounter Date: 01/09/2022  Check In:  Session Check In - 01/09/22 1500       Check-In   Supervising physician immediately available to respond to emergencies [facility] MD immediately available    Physician(s) Dr. Harrington Challenger    Location [facility]    Staff Present Redge Gainer, BS, Exercise Physiologist;Janmichael Giraud Kris Mouton, MS, ACSM-CEP, Exercise Physiologist;Christy Oletta Lamas, RN, BSN    Virtual Visit No    Medication changes reported     No    Fall or balance concerns reported    No    Tobacco Cessation No Change    Warm-up and Cool-down Performed as group-led instruction    Resistance Training Performed Yes    VAD Patient? No    PAD/SET Patient? No      Pain Assessment   Currently in Pain? No/denies    Multiple Pain Sites No             Capillary Blood Glucose: No results found for this or any previous visit (from the past 24 hour(s)).    Social History   Tobacco Use  Smoking Status Former   Packs/day: 1.00   Years: 66.00   Total pack years: 66.00   Types: Cigarettes   Quit date: 11/09/2020   Years since quitting: 1.1  Smokeless Tobacco Never  Tobacco Comments   started smoking age 9    Goals Met:  Proper associated with RPD/PD & O2 Sat Independence with exercise equipment Using PLB without cueing & demonstrates good technique Exercise tolerated well Queuing for purse lip breathing No report of concerns or symptoms today Strength training completed today  Goals Unmet:  Not Applicable  Comments: checkout time is 1600   Dr. Kathie Dike is Medical Director for [facility].

## 2022-01-11 ENCOUNTER — Encounter (HOSPITAL_COMMUNITY)
Admission: RE | Admit: 2022-01-11 | Discharge: 2022-01-11 | Disposition: A | Discharge status: Home / Self Care | Payer: PPO | Source: Ambulatory Visit | Attending: Pulmonary Disease | Admitting: Pulmonary Disease

## 2022-01-11 DIAGNOSIS — J432 Centrilobular emphysema: Secondary | ICD-10-CM | POA: Diagnosis not present

## 2022-01-11 DIAGNOSIS — J9611 Chronic respiratory failure with hypoxia: Secondary | ICD-10-CM

## 2022-01-11 IMAGING — DX DG CHEST 2V
2 series · 2 of 2 positions shown · non-contrast
Comparison: 07/24/2018

CLINICAL DATA: Weight loss for unknown reason, COPD, smoker with
cough, hypertension

EXAM:
CHEST - 2 VIEW

[chest pa]
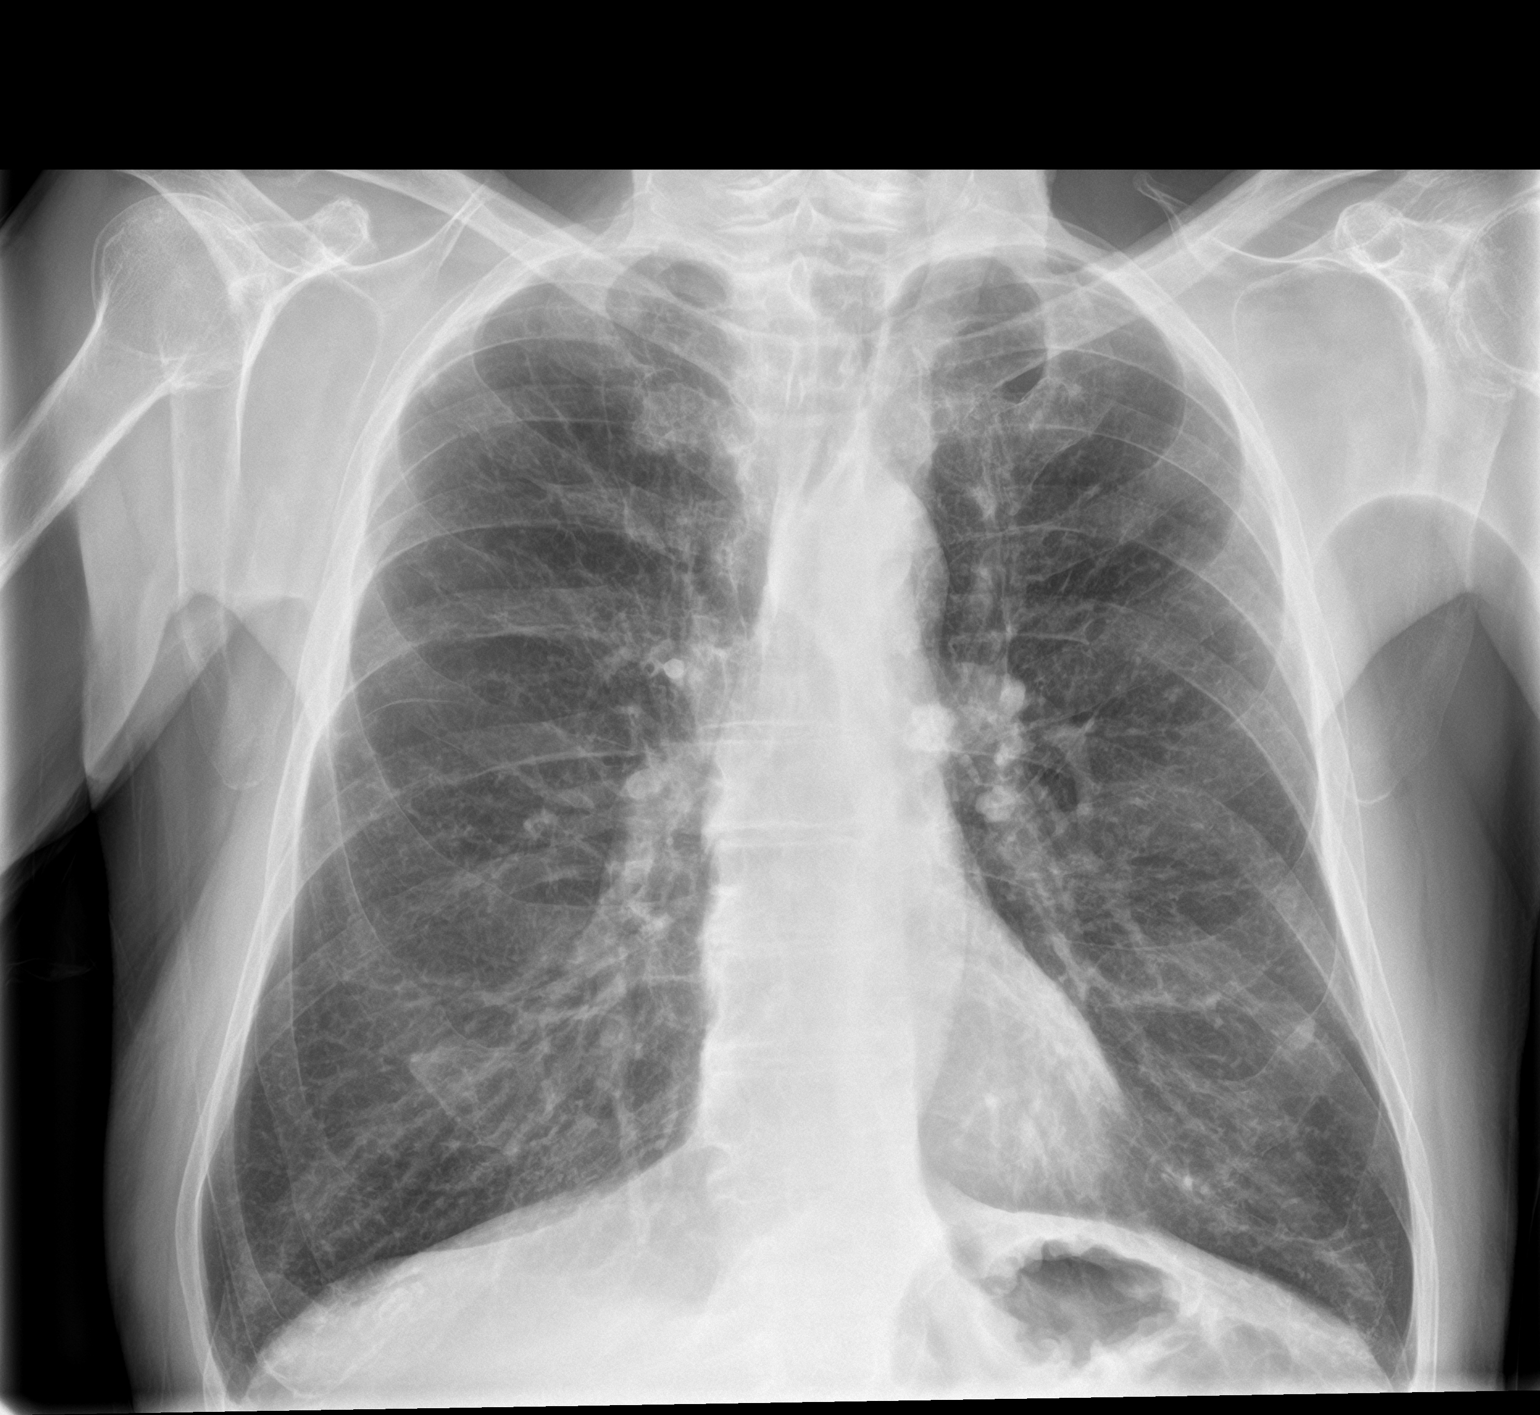

[chest lat]
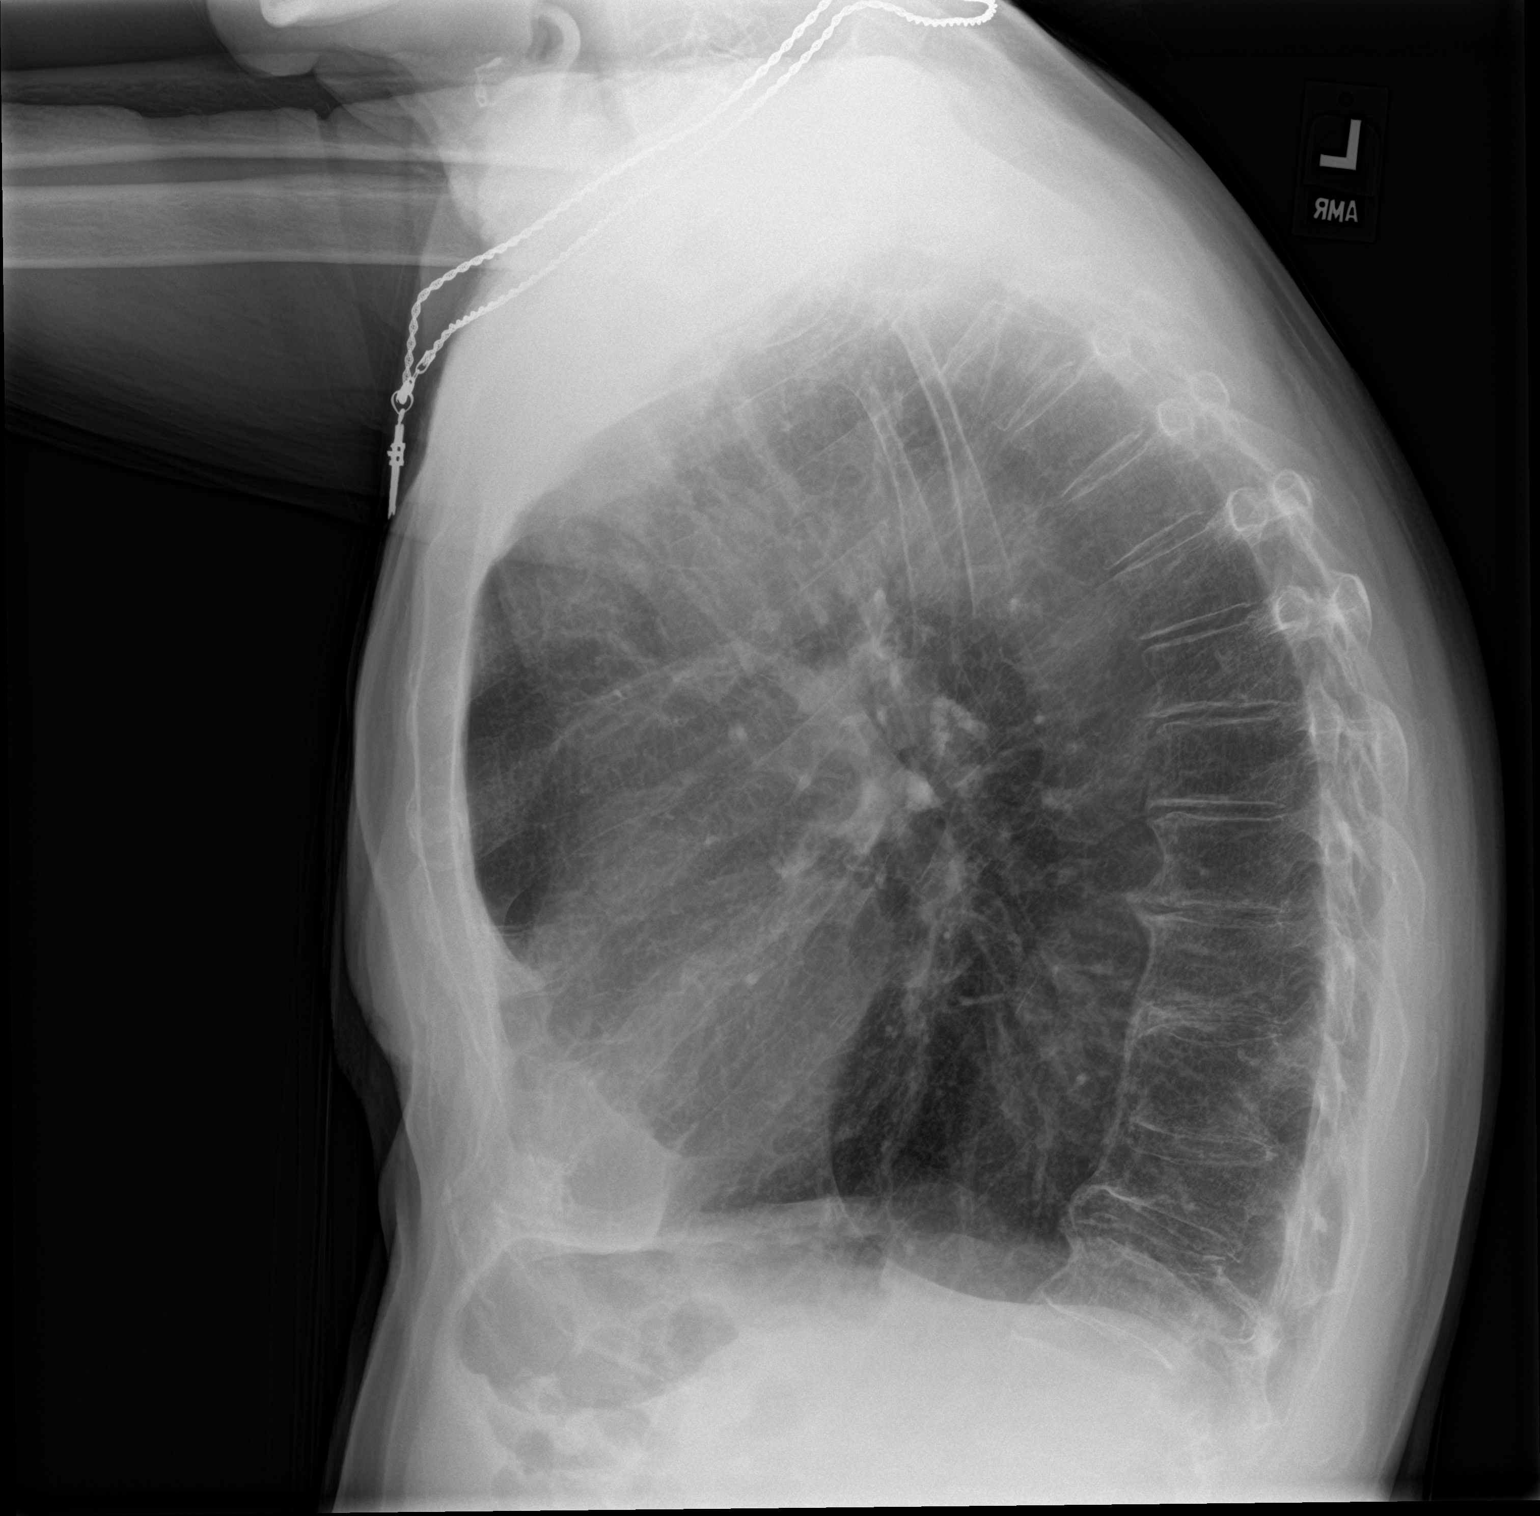

[2 of 2 positions shown; findings below may reference images not displayed]

FINDINGS: Normal heart size, mediastinal contours, and pulmonary vascularity.

Calcified granulomata at LEFT pulmonary hilum.

Question LEFT nipple shadow, potentially subtle RIGHT nipple shadow
as well, unable to confirm on lateral view.

Emphysematous and bronchitic changes consistent with COPD.

No acute infiltrate, pleural effusion or pneumothorax.

Diffuse osseous demineralization with LEFT glenohumeral degenerative
changes.
IMPRESSION: Changes of COPD and old granulomatous disease.

Question BILATERAL nipple shadows; repeat PA chest radiograph with
nipple markers recommended to exclude pulmonary nodule.

These results will be called to the ordering clinician or
representative by the Radiologist Assistant, and communication
documented in the PACS or zVision Dashboard.

## 2022-01-11 NOTE — Progress Notes (Signed)
Daily Session Note  Patient Details  Name: GAL FELDHAUS MRN: [id number] Date of Birth: [birth year] Referring Provider:   Flowsheet Row PULMONARY REHAB OTHER RESP ORIENTATION from 11/13/2021 in [place]  Referring Provider Dr. Elsworth Soho       Encounter Date: 01/11/2022  Check In:  Session Check In - 01/11/22 1500       Check-In   Supervising physician immediately available to respond to emergencies [facility] MD immediately available    Physician(s) Dr Radford Pax    Location [facility]    Staff Present Redge Gainer, BS, Exercise Physiologist;Phyllis Billingsley, RN;Debra Wynetta Emery, RN, Joanette Gula, RN, BSN    Virtual Visit No    Medication changes reported     No    Fall or balance concerns reported    No    Tobacco Cessation No Change    Warm-up and Cool-down Performed as group-led instruction    Resistance Training Performed Yes    VAD Patient? No    PAD/SET Patient? No      Pain Assessment   Currently in Pain? No/denies    Multiple Pain Sites No             Capillary Blood Glucose: No results found for this or any previous visit (from the past 24 hour(s)).    Social History   Tobacco Use  Smoking Status Former   Packs/day: 1.00   Years: 66.00   Total pack years: 66.00   Types: Cigarettes   Quit date: 11/09/2020   Years since quitting: 1.1  Smokeless Tobacco Never  Tobacco Comments   started smoking age 35    Goals Met:  Proper associated with RPD/PD & O2 Sat Independence with exercise equipment Using PLB without cueing & demonstrates good technique Exercise tolerated well Queuing for purse lip breathing No report of concerns or symptoms today Strength training completed today  Goals Unmet:  Not Applicable  Comments: Checkout at 1600.   Dr. Kathie Dike is Medical Director for [facility].

## 2022-01-16 ENCOUNTER — Encounter (HOSPITAL_COMMUNITY)
Admission: RE | Admit: 2022-01-16 | Discharge: 2022-01-16 | Disposition: A | Discharge status: Home / Self Care | Payer: PPO | Source: Ambulatory Visit | Attending: Pulmonary Disease | Admitting: Pulmonary Disease

## 2022-01-16 VITALS — Wt 130.7 lb

## 2022-01-16 DIAGNOSIS — J432 Centrilobular emphysema: Secondary | ICD-10-CM

## 2022-01-16 DIAGNOSIS — J9611 Chronic respiratory failure with hypoxia: Secondary | ICD-10-CM

## 2022-01-16 NOTE — Progress Notes (Signed)
Daily Session Note  Patient Details  Name: George Meyer MRN: [id number] Date of Birth: 10/01/1940 Referring Provider:   Flowsheet Row PULMONARY REHAB OTHER RESP ORIENTATION from 11/13/2021 in [place]  Referring Provider Dr. Elsworth Soho       Encounter Date: 01/16/2022  Check In:  Session Check In - 01/16/22 1500       Check-In   Supervising physician immediately available to respond to emergencies [facility] MD immediately available    Physician(s) Dr Zandra Abts    Location [facility]    Staff Present Nawaf Strange Hassell Done, RN, BSN;Heather Otho Ket, BS, Exercise Physiologist    Virtual Visit No    Medication changes reported     No    Fall or balance concerns reported    No    Tobacco Cessation No Change    Warm-up and Cool-down Performed as group-led instruction    Resistance Training Performed Yes    VAD Patient? No    PAD/SET Patient? No      Pain Assessment   Currently in Pain? No/denies    Multiple Pain Sites No             Capillary Blood Glucose: No results found for this or any previous visit (from the past 24 hour(s)).    Social History   Tobacco Use  Smoking Status Former   Packs/day: 1.00   Years: 66.00   Total pack years: 66.00   Types: Cigarettes   Quit date: 11/09/2020   Years since quitting: 1.1  Smokeless Tobacco Never  Tobacco Comments   started smoking age 28    Goals Met:  Proper associated with RPD/PD & O2 Sat Independence with exercise equipment Using PLB without cueing & demonstrates good technique Exercise tolerated well Queuing for purse lip breathing No report of concerns or symptoms today Strength training completed today  Goals Unmet:  Not Applicable  Comments: Checkout at 1600.    Dr. Kathie Dike is Medical Director for [facility].

## 2022-01-17 NOTE — Progress Notes (Signed)
Pulmonary Individual Treatment Plan  Patient Details  Name: George Meyer MRN: [id number] Date of Birth: 12-Jun-1941 Referring Provider:   Flowsheet Row PULMONARY REHAB OTHER RESP ORIENTATION from 11/13/2021 in [facility]  Referring Provider Dr. Elsworth Soho       Initial Encounter Date:  Flowsheet Row PULMONARY REHAB OTHER RESP ORIENTATION from 11/13/2021 in [facility]  Date 11/13/21       Visit Diagnosis: Centrilobular emphysema ([place])  Chronic respiratory failure with hypoxia and hypercapnia ([place])  Patient's Home Medications on Admission:   Current Outpatient Medications:    albuterol (PROVENTIL) (2.5 MG/3ML) 0.083% nebulizer solution, Take 2.5 mg by nebulization every 6 (six) hours as needed for wheezing., Disp: , Rfl:    albuterol (VENTOLIN HFA) 108 (90 Base) MCG/ACT inhaler, Inhale 2 puffs into the lungs every 4 (four) hours as needed., Disp: 54 g, Rfl: 0   ALPRAZolam (XANAX) 0.5 MG tablet, TAKE 1 TABLET BY MOUTH EVERY DAY AT BEDTIME AS NEEDED (Patient taking differently: Take 0.5 mg by mouth at bedtime.), Disp: 30 tablet, Rfl: 5   Ascorbic Acid (VITAMIN C PO), Take 1 tablet by mouth daily., Disp: , Rfl:    aspirin EC 81 MG tablet, Take 81 mg by mouth daily., Disp: , Rfl:    budesonide-formoterol (SYMBICORT) 160-4.5 MCG/ACT inhaler, INHALE TWO PUFFS BY MOUTH TWICE DAILY ** STOP SPIRIVA **, Disp: 1 each, Rfl: 6   docusate sodium (COLACE) 100 MG capsule, Take 100 mg by mouth 2 (two) times daily as needed for mild constipation., Disp: , Rfl:    dorzolamide-timolol (COSOPT) 22.3-6.8 MG/ML ophthalmic solution, Place 1 drop into both eyes 2 (two) times daily., Disp: 10 mL, Rfl: 0   ipratropium-albuterol (DUONEB) 0.5-2.5 (3) MG/3ML SOLN, USE 3 ML VIA NEBULIZER TWICE DAILY, Disp: 180 mL, Rfl: 1   mirtazapine (REMERON SOL-TAB) 15 MG disintegrating tablet, Take 1 tablet (15 mg total) by mouth at bedtime., Disp: 30 tablet, Rfl: 5   Multiple  Vitamins-Minerals (IMMUNE SUPPORT PO), Take 1 tablet by mouth daily., Disp: , Rfl:    nitroGLYCERIN (NITROSTAT) 0.4 MG SL tablet, 1 sl q 5 min prn chest pressure as directed (Patient taking differently: Place 0.4 mg under the tongue every 5 (five) minutes x 3 doses as needed for chest pain.), Disp: 50 tablet, Rfl: 3   pravastatin (PRAVACHOL) 40 MG tablet, Take 1 tablet (40 mg total) by mouth at bedtime., Disp: 90 tablet, Rfl: 1   Saw Palmetto 450 MG CAPS, Take 450 mg by mouth in the morning and at bedtime., Disp: , Rfl:   Past Medical History: Past Medical History:  Diagnosis Date   Anxiety    Cataracts, bilateral    removed by surgery   COPD (chronic obstructive pulmonary disease) (HCC)    Depression, major, single episode, moderate ([place]) 12/18/2018   Glaucoma    both eyes   Glaucoma    bilateral   H. pylori infection 09/17/2019   S/p treatment with Prevpac. Documented eradiation via breath test on 11/03/19.    Hyperlipidemia    Hypertension    Pre-diabetes    diet controlled   Skin cancer, basal cell    arms, face   Wears dentures    full    Tobacco Use: Social History   Tobacco Use  Smoking Status Former   Packs/day: 1.00   Years: 66.00   Total pack years: 66.00   Types: Cigarettes   Quit date: 11/09/2020   Years since quitting: 1.1  Smokeless  Tobacco Never  Tobacco Comments   started smoking age 61    Labs: Review Flowsheet  More data exists      Latest Ref Rng & Units 09/11/2017 01/20/2019 09/19/2020 08/21/2021 11/09/2021  Labs for ITP Cardiac and Pulmonary Rehab  Cholestrol 100 - 199 mg/dL 141  140  171  145  -  LDL (calc) 0 - 99 mg/dL 71  63  98  70  -  HDL-C >39 mg/dL 41  37  51  54  -  Trlycerides 0 - 149 mg/dL 146  198  125  119  -  Hemoglobin A1c 4.8 - 5.6 % - - - - 6.0     Capillary Blood Glucose: Lab Results  Component Value Date   GLUCAP 80 12/13/2020     Pulmonary Assessment Scores:  Pulmonary Assessment Scores     Row Name 11/13/21 1252          ADL UCSD   ADL Phase Entry     SOB Score total 34     Rest 0     Walk 2     Stairs 4     Bath 1     Dress 0     Shop 2       CAT Score   CAT Score 13       mMRC Score   mMRC Score 3             UCSD: Self-administered rating of dyspnea associated with activities of daily living (ADLs) 6-point scale (0 = "not at all" to 5 = "maximal or unable to do because of breathlessness")  Scoring Scores range from 0 to 120.  Minimally important difference is 5 units  CAT: CAT can identify the health impairment of COPD patients and is better correlated with disease progression.  CAT has a scoring range of zero to 40. The CAT score is classified into four groups of low (less than 10), medium (10 - 20), high (21-30) and very high (31-40) based on the impact level of disease on health status. A CAT score over 10 suggests significant symptoms.  A worsening CAT score could be explained by an exacerbation, poor medication adherence, poor inhaler technique, or progression of COPD or comorbid conditions.  CAT MCID is 2 points  mMRC: mMRC (Modified Medical Research Council) Dyspnea Scale is used to assess the degree of baseline functional disability in patients of respiratory disease due to dyspnea. No minimal important difference is established. A decrease in score of 1 point or greater is considered a positive change.   Pulmonary Function Assessment:   Exercise Target Goals: Exercise Program Goal: Individual exercise prescription set using results from initial 6 min walk test and THRR while considering  patient's activity barriers and safety.   Exercise Prescription Goal: Initial exercise prescription builds to 30-45 minutes a day of aerobic activity, 2-3 days per week.  Home exercise guidelines will be given to patient during program as part of exercise prescription that the participant will acknowledge.  Activity Barriers & Risk Stratification:  Activity Barriers & Cardiac Risk  Stratification - 11/13/21 1253       Activity Barriers & Cardiac Risk Stratification   Activity Barriers Back Problems;Deconditioning    Cardiac Risk Stratification Low             6 Minute Walk:  6 Minute Walk     Row Name 11/13/21 1349         6 Minute Walk  Phase Initial     Distance 900 feet     Walk Time 6 minutes     # of Rest Breaks 1     MPH 1.7     METS 2.17     RPE 12     Perceived Dyspnea  11     VO2 Peak 7.58     Symptoms Yes (comment)     Comments 1 minute standing rest due to desaturation and to put on nasal canula     Resting HR 75 bpm     Resting BP 130/62     Resting Oxygen Saturation  91 %     Exercise Oxygen Saturation  during 6 min walk 81 %     Max Ex. HR 95 bpm     Max Ex. BP 150/68     2 Minute Post BP 118/68       Interval HR   1 Minute HR 88     2 Minute HR 91     3 Minute HR 88     4 Minute HR 84     5 Minute HR 91     6 Minute HR 95     2 Minute Post HR 80     Interval Heart Rate? Yes       Interval Oxygen   Interval Oxygen? Yes     Baseline Oxygen Saturation % 91 %     1 Minute Oxygen Saturation % 90 %     1 Minute Liters of Oxygen 0 L     2 Minute Oxygen Saturation % 92 %     2 Minute Liters of Oxygen 0 L     3 Minute Oxygen Saturation % 81 %     3 Minute Liters of Oxygen 0 L     4 Minute Oxygen Saturation % 88 %     4 Minute Liters of Oxygen 2 L     5 Minute Oxygen Saturation % 92 %     5 Minute Liters of Oxygen 2 L     6 Minute Oxygen Saturation % 92 %     6 Minute Liters of Oxygen 2 L     2 Minute Post Oxygen Saturation % 94 %     2 Minute Post Liters of Oxygen 2 L              Oxygen Initial Assessment:  Oxygen Initial Assessment - 11/13/21 1348       Initial 6 min Walk   Oxygen Used Continuous    Liters per minute 2      Program Oxygen Prescription   Program Oxygen Prescription Continuous    Liters per minute 2    Comments --      Intervention   Short Term Goals To learn and understand  importance of monitoring SPO2 with pulse oximeter and demonstrate accurate use of the pulse oximeter.;To learn and understand importance of maintaining oxygen saturations>88%;To learn and demonstrate proper pursed lip breathing techniques or other breathing techniques.     Long  Term Goals Compliance with respiratory medication;Exhibits proper breathing techniques, such as pursed lip breathing or other method taught during program session;Maintenance of O2 saturations>88%;Verbalizes importance of monitoring SPO2 with pulse oximeter and return demonstration             Oxygen Re-Evaluation:  Oxygen Re-Evaluation     Row Name 11/22/21 0713 12/19/21 1500 01/17/22 0705         Program Oxygen  Prescription   Program Oxygen Prescription Continuous Continuous Continuous     Liters per minute '2 2 2       '$ Home Oxygen   Home Oxygen Device Home Concentrator;E-Tanks Home Concentrator;E-Tanks Home Concentrator;E-Tanks     Sleep Oxygen Prescription Continuous Continuous Continuous     Liters per minute '2 2 2     '$ Home Exercise Oxygen Prescription None None None     Home Resting Oxygen Prescription None None None     Compliance with Home Oxygen Use Yes Yes Yes       Goals/Expected Outcomes   Short Term Goals To learn and understand importance of monitoring SPO2 with pulse oximeter and demonstrate accurate use of the pulse oximeter.;To learn and understand importance of maintaining oxygen saturations>88%;To learn and demonstrate proper pursed lip breathing techniques or other breathing techniques.  To learn and understand importance of monitoring SPO2 with pulse oximeter and demonstrate accurate use of the pulse oximeter.;To learn and understand importance of maintaining oxygen saturations>88%;To learn and demonstrate proper pursed lip breathing techniques or other breathing techniques.  To learn and understand importance of monitoring SPO2 with pulse oximeter and demonstrate accurate use of the pulse  oximeter.;To learn and understand importance of maintaining oxygen saturations>88%;To learn and demonstrate proper pursed lip breathing techniques or other breathing techniques.      Long  Term Goals Compliance with respiratory medication;Exhibits proper breathing techniques, such as pursed lip breathing or other method taught during program session;Maintenance of O2 saturations>88%;Verbalizes importance of monitoring SPO2 with pulse oximeter and return demonstration Compliance with respiratory medication;Exhibits proper breathing techniques, such as pursed lip breathing or other method taught during program session;Maintenance of O2 saturations>88%;Verbalizes importance of monitoring SPO2 with pulse oximeter and return demonstration Compliance with respiratory medication;Exhibits proper breathing techniques, such as pursed lip breathing or other method taught during program session;Maintenance of O2 saturations>88%;Verbalizes importance of monitoring SPO2 with pulse oximeter and return demonstration     Goals/Expected Outcomes compliance compliance compliance              Oxygen Discharge (Final Oxygen Re-Evaluation):  Oxygen Re-Evaluation - 01/17/22 0705       Program Oxygen Prescription   Program Oxygen Prescription Continuous    Liters per minute 2      Home Oxygen   Home Oxygen Device Home Concentrator;E-Tanks    Sleep Oxygen Prescription Continuous    Liters per minute 2    Home Exercise Oxygen Prescription None    Home Resting Oxygen Prescription None    Compliance with Home Oxygen Use Yes      Goals/Expected Outcomes   Short Term Goals To learn and understand importance of monitoring SPO2 with pulse oximeter and demonstrate accurate use of the pulse oximeter.;To learn and understand importance of maintaining oxygen saturations>88%;To learn and demonstrate proper pursed lip breathing techniques or other breathing techniques.     Long  Term Goals Compliance with respiratory  medication;Exhibits proper breathing techniques, such as pursed lip breathing or other method taught during program session;Maintenance of O2 saturations>88%;Verbalizes importance of monitoring SPO2 with pulse oximeter and return demonstration    Goals/Expected Outcomes compliance             Initial Exercise Prescription:  Initial Exercise Prescription - 11/13/21 1300       Date of Initial Exercise RX and Referring Provider   Date 11/13/21    Referring Provider Dr. Elsworth Soho    Expected Discharge Date 03/20/22      Oxygen   Oxygen Continuous  Liters 2    Maintain Oxygen Saturation 88% or higher      Treadmill   MPH 1    Grade 0    Minutes 17      NuStep   Level 1    SPM 80    Minutes 22      Prescription Details   Frequency (times per week) 2    Duration Progress to 30 minutes of continuous aerobic without signs/symptoms of physical distress      Intensity   THRR 40-80% of Max Heartrate 55-110    Ratings of Perceived Exertion 11-13    Perceived Dyspnea 0-4      Resistance Training   Training Prescription Yes    Weight 3    Reps 10-15             Perform Capillary Blood Glucose checks as needed.  Exercise Prescription Changes:   Exercise Prescription Changes     Row Name 11/21/21 1500 12/05/21 1500 12/12/21 1500 12/19/21 1500 01/04/22 1600     Response to Exercise   Blood Pressure (Admit) 120/60 132/78 -- 118/72 124/62   Blood Pressure (Exercise) 118/74 140/78 -- 132/62 158/76   Blood Pressure (Exit) 110/68 -- -- 138/72 110/70   Heart Rate (Admit) 74 bpm 78 bpm -- 76 bpm 75 bpm   Heart Rate (Exercise) 87 bpm 95 bpm -- 91 bpm 104 bpm   Heart Rate (Exit) 71 bpm -- -- 70 bpm 95 bpm   Oxygen Saturation (Admit) 92 % 90 % -- 92 % 91 %   Oxygen Saturation (Exercise) 92 % 92 % -- 90 % 92 %   Oxygen Saturation (Exit) 97 % -- -- 90 % 92 %   Rating of Perceived Exertion (Exercise) 12 12 -- 12 12   Perceived Dyspnea (Exercise) 12 12 -- 12 12   Duration  Continue with 30 min of aerobic exercise without signs/symptoms of physical distress. Continue with 30 min of aerobic exercise without signs/symptoms of physical distress. -- Continue with 30 min of aerobic exercise without signs/symptoms of physical distress. Continue with 30 min of aerobic exercise without signs/symptoms of physical distress.   Intensity THRR unchanged THRR unchanged -- THRR unchanged THRR unchanged     Progression   Progression Continue to progress workloads to maintain intensity without signs/symptoms of physical distress. Continue to progress workloads to maintain intensity without signs/symptoms of physical distress. -- Continue to progress workloads to maintain intensity without signs/symptoms of physical distress. Continue to progress workloads to maintain intensity without signs/symptoms of physical distress.     Resistance Training   Training Prescription Yes Yes -- Yes Yes   Weight 3 4 -- 4 5   Reps 10-15 10-15 -- 10-15 10-15   Time 10 Minutes 10 Minutes -- 10 Minutes 10 Minutes     Oxygen   Oxygen Continuous Continuous -- Continuous Continuous   Liters 2 2 -- 2 2     Treadmill   MPH 1 1.5 -- 1.6 1.6   Grade 0 0 -- 0 0   Minutes 22 22 -- 22 22   METs 1.77 2.64 -- 2.23 2.23     NuStep   Level 1 1 -- 1 --   SPM 83 87 -- 79 --   Minutes 17 17 -- 17 --   METs 1.9 1.9 -- 1.9 --     Recumbant Elliptical   Level -- -- -- -- 2   RPM -- -- -- -- 36  Minutes -- -- -- -- 17   METs -- -- -- -- 2.2     Home Exercise Plan   Plans to continue exercise at -- -- Home (comment) -- --   Frequency -- -- Add 3 additional days to program exercise sessions. -- --   Initial Home Exercises Provided -- -- 12/12/21 -- --     Oxygen   Maintain Oxygen Saturation 88% or higher -- -- -- --    Row Name 01/16/22 1500             Response to Exercise   Blood Pressure (Admit) 122/72       Blood Pressure (Exercise) 130/70       Blood Pressure (Exit) 130/70       Heart  Rate (Admit) 85 bpm       Heart Rate (Exercise) 106 bpm       Heart Rate (Exit) 96 bpm       Oxygen Saturation (Admit) 90 %       Oxygen Saturation (Exercise) 88 %       Oxygen Saturation (Exit) 93 %       Rating of Perceived Exertion (Exercise) 13       Perceived Dyspnea (Exercise) 13       Duration Continue with 30 min of aerobic exercise without signs/symptoms of physical distress.       Intensity THRR unchanged         Progression   Progression Continue to progress workloads to maintain intensity without signs/symptoms of physical distress.         Resistance Training   Training Prescription Yes       Weight 5       Reps 10-15       Time 10 Minutes         Oxygen   Oxygen Continuous       Liters 2         Treadmill   MPH 1.7       Grade 0       Minutes 22       METs 2.3         Recumbant Elliptical   Level 2       RPM 42       Minutes 17       METs 2.7         Oxygen   Maintain Oxygen Saturation 88% or higher                Exercise Comments:   Exercise Comments     Row Name 12/12/21 1537           Exercise Comments home exercised reviewed                Exercise Goals and Review:   Exercise Goals     Row Name 11/13/21 1355 11/22/21 0716 12/19/21 1500 01/17/22 0708       Exercise Goals   Increase Physical Activity Yes Yes Yes Yes    Intervention Provide advice, education, support and counseling about physical activity/exercise needs.;Develop an individualized exercise prescription for aerobic and resistive training based on initial evaluation findings, risk stratification, comorbidities and participant's personal goals. Provide advice, education, support and counseling about physical activity/exercise needs.;Develop an individualized exercise prescription for aerobic and resistive training based on initial evaluation findings, risk stratification, comorbidities and participant's personal goals. Provide advice, education, support and  counseling about physical activity/exercise needs.;Develop an individualized exercise prescription for aerobic  and resistive training based on initial evaluation findings, risk stratification, comorbidities and participant's personal goals. Provide advice, education, support and counseling about physical activity/exercise needs.;Develop an individualized exercise prescription for aerobic and resistive training based on initial evaluation findings, risk stratification, comorbidities and participant's personal goals.    Expected Outcomes Short Term: Attend rehab on a regular basis to increase amount of physical activity.;Long Term: Add in home exercise to make exercise part of routine and to increase amount of physical activity.;Long Term: Exercising regularly at least 3-5 days a week. Short Term: Attend rehab on a regular basis to increase amount of physical activity.;Long Term: Add in home exercise to make exercise part of routine and to increase amount of physical activity.;Long Term: Exercising regularly at least 3-5 days a week. Short Term: Attend rehab on a regular basis to increase amount of physical activity.;Long Term: Add in home exercise to make exercise part of routine and to increase amount of physical activity.;Long Term: Exercising regularly at least 3-5 days a week. Short Term: Attend rehab on a regular basis to increase amount of physical activity.;Long Term: Add in home exercise to make exercise part of routine and to increase amount of physical activity.;Long Term: Exercising regularly at least 3-5 days a week.    Increase Strength and Stamina Yes Yes Yes Yes    Intervention Provide advice, education, support and counseling about physical activity/exercise needs.;Develop an individualized exercise prescription for aerobic and resistive training based on initial evaluation findings, risk stratification, comorbidities and participant's personal goals. Provide advice, education, support and  counseling about physical activity/exercise needs.;Develop an individualized exercise prescription for aerobic and resistive training based on initial evaluation findings, risk stratification, comorbidities and participant's personal goals. Provide advice, education, support and counseling about physical activity/exercise needs.;Develop an individualized exercise prescription for aerobic and resistive training based on initial evaluation findings, risk stratification, comorbidities and participant's personal goals. Provide advice, education, support and counseling about physical activity/exercise needs.;Develop an individualized exercise prescription for aerobic and resistive training based on initial evaluation findings, risk stratification, comorbidities and participant's personal goals.    Expected Outcomes Short Term: Increase workloads from initial exercise prescription for resistance, speed, and METs.;Short Term: Perform resistance training exercises routinely during rehab and add in resistance training at home;Long Term: Improve cardiorespiratory fitness, muscular endurance and strength as measured by increased METs and functional capacity (6MWT) Short Term: Increase workloads from initial exercise prescription for resistance, speed, and METs.;Short Term: Perform resistance training exercises routinely during rehab and add in resistance training at home;Long Term: Improve cardiorespiratory fitness, muscular endurance and strength as measured by increased METs and functional capacity (6MWT) Short Term: Increase workloads from initial exercise prescription for resistance, speed, and METs.;Short Term: Perform resistance training exercises routinely during rehab and add in resistance training at home;Long Term: Improve cardiorespiratory fitness, muscular endurance and strength as measured by increased METs and functional capacity (6MWT) Short Term: Increase workloads from initial exercise prescription for  resistance, speed, and METs.;Short Term: Perform resistance training exercises routinely during rehab and add in resistance training at home;Long Term: Improve cardiorespiratory fitness, muscular endurance and strength as measured by increased METs and functional capacity (6MWT)    Able to understand and use rate of perceived exertion (RPE) scale Yes Yes Yes Yes    Intervention Provide education and explanation on how to use RPE scale Provide education and explanation on how to use RPE scale Provide education and explanation on how to use RPE scale Provide education and explanation on how to  use RPE scale    Expected Outcomes Short Term: Able to use RPE daily in rehab to express subjective intensity level;Long Term:  Able to use RPE to guide intensity level when exercising independently Short Term: Able to use RPE daily in rehab to express subjective intensity level;Long Term:  Able to use RPE to guide intensity level when exercising independently Short Term: Able to use RPE daily in rehab to express subjective intensity level;Long Term:  Able to use RPE to guide intensity level when exercising independently Short Term: Able to use RPE daily in rehab to express subjective intensity level;Long Term:  Able to use RPE to guide intensity level when exercising independently    Able to understand and use Dyspnea scale Yes Yes Yes Yes    Intervention Provide education and explanation on how to use Dyspnea scale Provide education and explanation on how to use Dyspnea scale Provide education and explanation on how to use Dyspnea scale Provide education and explanation on how to use Dyspnea scale    Expected Outcomes Short Term: Able to use Dyspnea scale daily in rehab to express subjective sense of shortness of breath during exertion;Long Term: Able to use Dyspnea scale to guide intensity level when exercising independently Short Term: Able to use Dyspnea scale daily in rehab to express subjective sense of shortness of  breath during exertion;Long Term: Able to use Dyspnea scale to guide intensity level when exercising independently Short Term: Able to use Dyspnea scale daily in rehab to express subjective sense of shortness of breath during exertion;Long Term: Able to use Dyspnea scale to guide intensity level when exercising independently Short Term: Able to use Dyspnea scale daily in rehab to express subjective sense of shortness of breath during exertion;Long Term: Able to use Dyspnea scale to guide intensity level when exercising independently    Knowledge and understanding of Target Heart Rate Range (THRR) Yes Yes Yes Yes    Intervention Provide education and explanation of THRR including how the numbers were predicted and where they are located for reference Provide education and explanation of THRR including how the numbers were predicted and where they are located for reference Provide education and explanation of THRR including how the numbers were predicted and where they are located for reference Provide education and explanation of THRR including how the numbers were predicted and where they are located for reference    Expected Outcomes Short Term: Able to state/look up THRR;Long Term: Able to use THRR to govern intensity when exercising independently;Short Term: Able to use daily as guideline for intensity in rehab Short Term: Able to state/look up THRR;Long Term: Able to use THRR to govern intensity when exercising independently;Short Term: Able to use daily as guideline for intensity in rehab Short Term: Able to state/look up THRR;Long Term: Able to use THRR to govern intensity when exercising independently;Short Term: Able to use daily as guideline for intensity in rehab Short Term: Able to state/look up THRR;Long Term: Able to use THRR to govern intensity when exercising independently;Short Term: Able to use daily as guideline for intensity in rehab    Understanding of Exercise Prescription Yes Yes Yes Yes     Intervention Provide education, explanation, and written materials on patient's individual exercise prescription Provide education, explanation, and written materials on patient's individual exercise prescription Provide education, explanation, and written materials on patient's individual exercise prescription Provide education, explanation, and written materials on patient's individual exercise prescription    Expected Outcomes Long Term: Able to explain home  exercise prescription to exercise independently;Short Term: Able to explain program exercise prescription Long Term: Able to explain home exercise prescription to exercise independently;Short Term: Able to explain program exercise prescription Long Term: Able to explain home exercise prescription to exercise independently;Short Term: Able to explain program exercise prescription Long Term: Able to explain home exercise prescription to exercise independently;Short Term: Able to explain program exercise prescription             Exercise Goals Re-Evaluation :  Exercise Goals Re-Evaluation     [place] Name 11/22/21 0716 12/19/21 1500 01/17/22 0708         Exercise Goal Re-Evaluation   Exercise Goals Review Increase Physical Activity;Able to understand and use Dyspnea scale;Understanding of Exercise Prescription;Knowledge and understanding of Target Heart Rate Range (THRR);Increase Strength and Stamina;Able to understand and use rate of perceived exertion (RPE) scale Increase Physical Activity;Increase Strength and Stamina;Able to understand and use rate of perceived exertion (RPE) scale;Able to understand and use Dyspnea scale;Knowledge and understanding of Target Heart Rate Range (THRR);Understanding of Exercise Prescription Increase Physical Activity;Increase Strength and Stamina;Able to understand and use rate of perceived exertion (RPE) scale;Able to understand and use Dyspnea scale;Knowledge and understanding of Target Heart Rate Range  (THRR);Understanding of Exercise Prescription     Comments Pt has completed 2 exercise sessions of PR. He is motivated to improve himself. He has been 1 week cigarette free and reports that he is feeling much better due to this. He is currently exercising at 1.9 METs on the stepper. Will continue to monitor and progress as able. Pt has completed 10 sessions of PR. He continues to be motivated when in class and is pushing himself. He is currently exercising at 2.23 METs on the treadmill. Will monitor and progress as able. Pt has completed 16 exercise sessions of pulmonary rehab. He continues to be cigarette free. He continues to be motivated and to push himself in class. He is now doing some exercise at home, even though he does not wear his oxygen at home. He is currently exercising at 2.3 METs on the treadmill. Will continue to monitor and progress as able.     Expected Outcomes Through exercise at rehab and at home, the patient will meet their stated goals. Through exercise at rehab and at home, the patient will meet their stated goals. Through exercise at rehab and at home, the patient will meet their stated goals.              Discharge Exercise Prescription (Final Exercise Prescription Changes):  Exercise Prescription Changes - 01/16/22 1500       Response to Exercise   Blood Pressure (Admit) 122/72    Blood Pressure (Exercise) 130/70    Blood Pressure (Exit) 130/70    Heart Rate (Admit) 85 bpm    Heart Rate (Exercise) 106 bpm    Heart Rate (Exit) 96 bpm    Oxygen Saturation (Admit) 90 %    Oxygen Saturation (Exercise) 88 %    Oxygen Saturation (Exit) 93 %    Rating of Perceived Exertion (Exercise) 13    Perceived Dyspnea (Exercise) 13    Duration Continue with 30 min of aerobic exercise without signs/symptoms of physical distress.    Intensity THRR unchanged      Progression   Progression Continue to progress workloads to maintain intensity without signs/symptoms of physical  distress.      Resistance Training   Training Prescription Yes    Weight 5    Reps 10-15  Time 10 Minutes      Oxygen   Oxygen Continuous    Liters 2      Treadmill   MPH 1.7    Grade 0    Minutes 22    METs 2.3      Recumbant Elliptical   Level 2    RPM 42    Minutes 17    METs 2.7      Oxygen   Maintain Oxygen Saturation 88% or higher             Nutrition:  Target Goals: Understanding of nutrition guidelines, daily intake of sodium '1500mg'$ , cholesterol '200mg'$ , calories 30% from fat and 7% or less from saturated fats, daily to have 5 or more servings of fruits and vegetables.  Biometrics:  Pre Biometrics - 11/13/21 1355       Pre Biometrics   Height '5\' 8"'$  (1.727 m)    Weight 56.3 kg    Waist Circumference 35.5 inches    Hip Circumference 36 inches    Waist to Hip Ratio 0.99 %    BMI (Calculated) 18.88    Triceps Skinfold 16 mm    % Body Fat 23.1 %    Grip Strength 27.6 kg    Flexibility 0 in    Single Leg Stand 8 seconds              Nutrition Therapy Plan and Nutrition Goals:  Nutrition Therapy & Goals - 01/09/22 1437       Personal Nutrition Goals   Comments Handout provided and discussed regarding healthier choices.  We provide 2 educational sessions on heart healthy nutrition with handouts. We also provide assistance with RD referal if patient is interested.      Intervention Plan   Intervention Nutrition handout(s) given to patient.    Expected Outcomes Short Term Goal: Understand basic principles of dietary content, such as calories, fat, sodium, cholesterol and nutrients.             Nutrition Assessments:  Nutrition Assessments - 11/13/21 1300       MEDFICTS Scores   Pre Score 36            MEDIFICTS Score Key: ?70 Need to make dietary changes  40-70 Heart Healthy Diet ? 40 Therapeutic Level Cholesterol Diet   Picture Your Plate Scores: <63 Unhealthy dietary pattern with much room for improvement. 41-50  Dietary pattern unlikely to meet recommendations for good health and room for improvement. 51-60 More healthful dietary pattern, with some room for improvement.  >60 Healthy dietary pattern, although there may be some specific behaviors that could be improved.    Nutrition Goals Re-Evaluation:  Nutrition Goals Re-Evaluation     [place] Name 12/15/21 1032             Goals   Comment We provide 2 educational sessions on heart healthy nutrition with handouts and assistance with RD referrals if patient is interested.       Expected Outcome Handout provided and explained regarding heathier choices.  Patient verbalized understanding.                Nutrition Goals Discharge (Final Nutrition Goals Re-Evaluation):  Nutrition Goals Re-Evaluation - 12/15/21 1032       Goals   Comment We provide 2 educational sessions on heart healthy nutrition with handouts and assistance with RD referrals if patient is interested.    Expected Outcome Handout provided and explained regarding heathier choices.  Patient verbalized understanding.  Psychosocial: Target Goals: Acknowledge presence or absence of significant depression and/or stress, maximize coping skills, provide positive support system. Participant is able to verbalize types and ability to use techniques and skills needed for reducing stress and depression.  Initial Review & Psychosocial Screening:  Initial Psych Review & Screening - 11/13/21 1406       Initial Review   Current issues with Current Anxiety/Panic;Current Depression      Family Dynamics   Good Support System? Yes    Comments His support system is his friend, Terri Piedra. He reports having a good relationship with his daughter and ex wife as well.      Barriers   Psychosocial barriers to participate in program The patient should benefit from training in stress management and relaxation.      Screening Interventions   Interventions Encouraged to exercise     Expected Outcomes Long Term goal: The participant improves quality of Life and PHQ9 Scores as seen by post scores and/or verbalization of changes;Short Term goal: Identification and review with participant of any Quality of Life or Depression concerns found by scoring the questionnaire.             Quality of Life Scores:  Quality of Life - 11/13/21 1356       Quality of Life   Select Quality of Life      Quality of Life Scores   Health/Function Pre 19.96 %    Socioeconomic Pre 25.75 %    Psych/Spiritual Pre 29 %    Family Pre 30 %    GLOBAL Pre 24.57 %            Scores of 19 and below usually indicate a poorer quality of life in these areas.  A difference of  2-3 points is a clinically meaningful difference.  A difference of 2-3 points in the total score of the Quality of Life Index has been associated with significant improvement in overall quality of life, self-image, physical symptoms, and general health in studies assessing change in quality of life.   PHQ-9: Review Flowsheet  More data exists      11/13/2021 04/25/2021 01/23/2021 12/09/2020 09/12/2020  Depression screen PHQ 2/9  Decreased Interest 0 1 2 0 0  Down, Depressed, Hopeless 0 1 0 0 0  PHQ - 2 Score 0 2 2 0 0  Altered sleeping 0 0 0 - 0  Tired, decreased energy '1 1 1 '$ - 0  Change in appetite '1 2 1 '$ - 3  Feeling bad or failure about yourself  0 0 0 - 0  Trouble concentrating 0 0 0 - 0  Moving slowly or fidgety/restless 0 0 0 - 0  Suicidal thoughts 0 0 0 - 0  PHQ-9 Score '2 5 4 '$ - 3  Difficult doing work/chores Not difficult at all Not difficult at all Not difficult at all - Not difficult at all   Interpretation of Total Score  Total Score Depression Severity:  1-4 = Minimal depression, 5-9 = Mild depression, 10-14 = Moderate depression, 15-19 = Moderately severe depression, 20-27 = Severe depression   Psychosocial Evaluation and Intervention:  Psychosocial Evaluation - 11/13/21 1358       Psychosocial  Evaluation & Interventions   Interventions Encouraged to exercise with the program and follow exercise prescription;Relaxation education;Stress management education    Comments Pt has no barriers to participating in PR. He has no identifiable psychosocial issues, but he is on mirtazapine 15 mg and alprazolam 0.5 mg for depression  and anxiety. When questioned about his anxiety and depression he denies having both, and is reports that Dr. Wolfgang Phoenix put him on these about 4 years ago. He states that he is on these medications because he is "hyperactive and nervous". He scored a 2 on his PHQ-9 and this relates to his lack of energy from his lung disease and his poor appetite. He has been trying for some time to gain weight, but has been unable to do so. He has smoked cigarettes for about 70 years ranging from 1/4 pack per day to a pack per day. He has recently been smoking 1/4 pack per day. He reports that he quit smoking as of today. He has tried to quit smoking over 20 times but has been able to do so. He is using nicotine lozenges to aid his smoking cessation. He is motivated to quit and is hopeful that this will finally be the time he is able to quit for good. He reports that he has a good support system. He has a group of friends that he keeps up with. Both of his children and his ex-wife live in [place], and he reports that he speaks with them frequently. His goals while in the program are to stay cigarette free and to decrease his SOB with exertion. He is eager to begin the program.    Expected Outcomes Pt will continue to have no identifiable psychosocial issues and his anxiety and depression will continue to be treated.    Continue Psychosocial Services  No Follow up required             Psychosocial Re-Evaluation:  Psychosocial Re-Evaluation     [place] Name 11/20/21 0729 12/12/21 0747 12/14/21 0824 12/15/21 1019 01/09/22 1427     Psychosocial Re-Evaluation   Current issues with History of  Depression History of Depression -- History of Depression History of Depression   Comments Patient is new to the program completing 2 sessions. He does have a history of major depression and anxiety which is managed with mirtazapine and Alprazolam. He seems to enjoy coming to the program and demonstrates an interest in improving his health. We will continue to monitor his progress. Patient is new to the program completing 8 sessions. He does have a history of major depression and anxiety which is managed with mirtazapine and Alprazolam. He seems to enjoy coming to the program and demonstrates an interest in improving his health. We will continue to monitor his progress. Patient completed 8 sessions. He does have a history of major depression and anxiety which is managed with mirtazapine and Alprazolam. He seems to enjoy coming to the program and demonstrates an interest in improving his health. We will continue to monitor his progress. Patient has completed 9 sessions. He does have a history of major depression and anxiety which is managed with mirtazapine and Alprazolam. He seems to enjoy coming to the program and demonstrates an interest in improving his health with a positive outlook.  We will continue to monitor his progress as he progress in the program. Patient has completed 14 sessions. He does have a history of major depression and anxiety which is managed with mirtazapine and Alprazolam. He seems to enjoy coming to the program and demonstrates an interest in improving his health with a positive outlook.  Patient is very sociable and enjoys talking with the class and staff .  We will continue to monitor his progress as he progress in the program.   Expected Outcomes Patient will  continue to have no psychosocial barriers identified and his depression and anxiety will continue to be managed. Patient will continue to have no psychosocial barriers identified and his depression and anxiety will continue to be  managed. -- Patient will continue to have no psychosocial barriers identified at discharge. Patient will continue to have no psychosocial barriers identified at discharge.   Interventions Stress management education;Encouraged to attend Pulmonary Rehabilitation for the exercise;Relaxation education Stress management education;Encouraged to attend Pulmonary Rehabilitation for the exercise;Relaxation education -- Stress management education;Encouraged to attend Pulmonary Rehabilitation for the exercise;Relaxation education Stress management education;Encouraged to attend Pulmonary Rehabilitation for the exercise;Relaxation education   Continue Psychosocial Services  No Follow up required No Follow up required -- No Follow up required No Follow up required            Psychosocial Discharge (Final Psychosocial Re-Evaluation):  Psychosocial Re-Evaluation - 01/09/22 1427       Psychosocial Re-Evaluation   Current issues with History of Depression    Comments Patient has completed 14 sessions. He does have a history of major depression and anxiety which is managed with mirtazapine and Alprazolam. He seems to enjoy coming to the program and demonstrates an interest in improving his health with a positive outlook.  Patient is very sociable and enjoys talking with the class and staff .  We will continue to monitor his progress as he progress in the program.    Expected Outcomes Patient will continue to have no psychosocial barriers identified at discharge.    Interventions Stress management education;Encouraged to attend Pulmonary Rehabilitation for the exercise;Relaxation education    Continue Psychosocial Services  No Follow up required              Education: Education Goals: Education classes will be provided on a weekly basis, covering required topics. Participant will state understanding/return demonstration of topics presented.  Learning Barriers/Preferences:  Learning  Barriers/Preferences - 11/13/21 1301       Learning Barriers/Preferences   Learning Barriers None    Learning Preferences Audio;Computer/Internet;Group Instruction;Individual Instruction;Pictoral;Skilled Demonstration;Verbal Instruction;Video;Written Material             Education Topics: How Lungs Work and Diseases: - Discuss the anatomy of the lungs and diseases that can affect the lungs, such as COPD. Flowsheet Row PULMONARY REHAB OTHER RESPIRATORY from 01/11/2022 in [facility]  Date 01/04/22  Educator pb  Instruction Review Code 1- Verbalizes Understanding       Exercise: -Discuss the importance of exercise, FITT principles of exercise, normal and abnormal responses to exercise, and how to exercise safely.   Environmental Irritants: -Discuss types of environmental irritants and how to limit exposure to environmental irritants. Flowsheet Row PULMONARY REHAB OTHER RESPIRATORY from 01/11/2022 in [place]  Date 01/11/22  Educator [place]  Instruction Review Code 1- Verbalizes Understanding       Meds/Inhalers and oxygen: - Discuss respiratory medications, definition of an inhaler and oxygen, and the proper way to use an inhaler and oxygen.   Energy Saving Techniques: - Discuss methods to conserve energy and decrease shortness of breath when performing activities of daily living.    Bronchial Hygiene / Breathing Techniques: - Discuss breathing mechanics, pursed-lip breathing technique,  proper posture, effective ways to clear airways, and other functional breathing techniques   Cleaning Equipment: - Provides group verbal and written instruction about the health risks of elevated stress, cause of high stress, and healthy ways to reduce stress.   Nutrition I: Fats: -  Discuss the types of cholesterol, what cholesterol does to the body, and how cholesterol levels can be controlled. Flowsheet Row PULMONARY REHAB OTHER  RESPIRATORY from 01/11/2022 in [facility]  Date 11/16/21  Educator [place]  Instruction Review Code 1- Verbalizes Understanding       Nutrition II: Labels: -Discuss the different components of food labels and how to read food labels. Flowsheet Row PULMONARY REHAB OTHER RESPIRATORY from 01/11/2022 in [facility]  Date 11/23/21  Educator DF  Instruction Review Code 1- Verbalizes Understanding       Respiratory Infections: - Discuss the signs and symptoms of respiratory infections, ways to prevent respiratory infections, and the importance of seeking medical treatment when having a respiratory infection. Flowsheet Row PULMONARY REHAB OTHER RESPIRATORY from 01/11/2022 in [facility]  Date 11/30/21  Educator [place]  Instruction Review Code 1- Verbalizes Understanding       Stress I: Signs and Symptoms: - Discuss the causes of stress, how stress may lead to anxiety and depression, and ways to limit stress. Flowsheet Row PULMONARY REHAB OTHER RESPIRATORY from 01/11/2022 in [facility]  Date 12/07/21  Educator [place]  Instruction Review Code 1- Verbalizes Understanding       Stress II: Relaxation: -Discuss relaxation techniques to limit stress.   Oxygen for Home/Travel: - Discuss how to prepare for travel when on oxygen and proper ways to transport and store oxygen to ensure safety.   Knowledge Questionnaire Score:  Knowledge Questionnaire Score - 11/13/21 1302       Knowledge Questionnaire Score   Pre Score 9/18             Core Components/Risk Factors/Patient Goals at Admission:  Personal Goals and Risk Factors at Admission - 11/13/21 1304       Core Components/Risk Factors/Patient Goals on Admission   Tobacco Cessation Yes    Number of packs per day 1/4 recently, but quit 11/13/2021    Intervention Assist the participant in steps to quit. Provide individualized education and counseling  about committing to Tobacco Cessation, relapse prevention, and pharmacological support that can be provided by physician.;Advice worker, assist with locating and accessing local/national Quit Smoking programs, and support quit date choice.    Expected Outcomes Long Term: Complete abstinence from all tobacco products for at least 12 months from quit date.;Short Term: Will quit all tobacco product use, adhering to prevention of relapse plan.;Short Term: Will demonstrate readiness to quit, by selecting a quit date.    Improve shortness of breath with ADL's Yes    Intervention Provide education, individualized exercise plan and daily activity instruction to help decrease symptoms of SOB with activities of daily living.    Expected Outcomes Short Term: Improve cardiorespiratory fitness to achieve a reduction of symptoms when performing ADLs;Long Term: Be able to perform more ADLs without symptoms or delay the onset of symptoms    Hypertension Yes    Intervention Provide education on lifestyle modifcations including regular physical activity/exercise, weight management, moderate sodium restriction and increased consumption of fresh fruit, vegetables, and low fat dairy, alcohol moderation, and smoking cessation.;Monitor prescription use compliance.    Expected Outcomes Short Term: Continued assessment and intervention until BP is < 140/68m HG in hypertensive participants. < 130/824mHG in hypertensive participants with diabetes, heart failure or chronic kidney disease.;Long Term: Maintenance of blood pressure at goal levels.    Lipids Yes    Intervention Provide education and support for participant on nutrition & aerobic/resistive  exercise along with prescribed medications to achieve LDL '70mg'$ , HDL >'40mg'$ .    Expected Outcomes Short Term: Participant states understanding of desired cholesterol values and is compliant with medications prescribed. Participant is following exercise prescription and  nutrition guidelines.;Long Term: Cholesterol controlled with medications as prescribed, with individualized exercise RX and with personalized nutrition plan. Value goals: LDL < '70mg'$ , HDL > 40 mg.             Core Components/Risk Factors/Patient Goals Review:   Goals and Risk Factor Review     Row Name 11/20/21 0733 12/12/21 0748 12/15/21 1037 01/09/22 1443       Core Components/Risk Factors/Patient Goals Review   Personal Goals Review Tobacco Cessation;Weight Management/Obesity;Improve shortness of breath with ADL's;Lipids;Hypertension;Other Tobacco Cessation;Weight Management/Obesity;Improve shortness of breath with ADL's;Lipids;Hypertension;Other Tobacco Cessation;Weight Management/Obesity;Improve shortness of breath with ADL's;Lipids;Hypertension;Other Tobacco Cessation;Improve shortness of breath with ADL's;Hypertension;Lipids    Review Patient was referred to PR wtih Centrilobuler emphysema and chronic respiratory failure. He has completed 1 session exercising on 2L with oxygen saturations running 95%. His personal goals for the program are to decrease his SOB with exertion and continue to abstain from smoking. We will continue to monitor his progress as he works towards meeting these goals. Patient was referred to PR wtih Centrilobuler emphysema and chronic respiratory failure. He has completed 8 session exercising on 2L with oxygen saturations running 95%. His personal goals for the program are to decrease his SOB with exertion and continue to abstain from smoking. We will continue to monitor his progress as he works towards meeting these goals. Patient has completed  8 session.  He is exercising on 2L with oxygen saturations running 90%-95%. We encourage pursed lipped breathing technique. He tolerated using the nu-step and treadmill without difficulty.  His personal goals for the program are to decrease his SOB with exertion and continue to abstain from smoking. We will continue to monitor  his progress as he works towards meeting these goals. Patient has completed  14 session.  He is exercising on 2L with oxygen saturations running 90%-93%, while exercising.  We encourage pursed lipped breathing technique. He tolerated using the nu-step and treadmill without difficulty.  His personal goals for the program are to decrease his SOB with exertion and continue to abstain from smoking. We will continue to monitor his progress as he works towards meeting these goals.    Expected Outcomes Patient will complete the program meeting both personal and program goals. Patient will complete the program meeting both personal and program goals. Patient will complete the program meeting both personal and program goals. Patient will complete the program meeting both personal and program goals.             Core Components/Risk Factors/Patient Goals at Discharge (Final Review):   Goals and Risk Factor Review - 01/09/22 1443       Core Components/Risk Factors/Patient Goals Review   Personal Goals Review Tobacco Cessation;Improve shortness of breath with ADL's;Hypertension;Lipids    Review Patient has completed  14 session.  He is exercising on 2L with oxygen saturations running 90%-93%, while exercising.  We encourage pursed lipped breathing technique. He tolerated using the nu-step and treadmill without difficulty.  His personal goals for the program are to decrease his SOB with exertion and continue to abstain from smoking. We will continue to monitor his progress as he works towards meeting these goals.    Expected Outcomes Patient will complete the program meeting both personal and program goals.  ITP Comments:   Comments: ITP REVIEW Pt is making expected progress toward pulmonary rehab goals after completing 17 sessions. Recommend continued exercise, life style modification, education, and utilization of breathing techniques to increase stamina and strength and decrease shortness  of breath with exertion.

## 2022-01-17 NOTE — Progress Notes (Signed)
Pulmonary Individual Treatment Plan  Patient Details  Name: George Meyer MRN: [id number] Date of Birth: 1940/10/26 Referring Provider:   Flowsheet Row PULMONARY REHAB OTHER RESP ORIENTATION from 11/13/2021 in [facility]  Referring Provider Dr. Elsworth Soho       Initial Encounter Date:  Flowsheet Row PULMONARY REHAB OTHER RESP ORIENTATION from 11/13/2021 in [facility]  Date 11/13/21       Visit Diagnosis: Centrilobular emphysema ([place])  Chronic respiratory failure with hypoxia and hypercapnia ([place])  Patient's Home Medications on Admission:   Current Outpatient Medications:    albuterol (PROVENTIL) (2.5 MG/3ML) 0.083% nebulizer solution, Take 2.5 mg by nebulization every 6 (six) hours as needed for wheezing., Disp: , Rfl:    albuterol (VENTOLIN HFA) 108 (90 Base) MCG/ACT inhaler, Inhale 2 puffs into the lungs every 4 (four) hours as needed., Disp: 54 g, Rfl: 0   ALPRAZolam (XANAX) 0.5 MG tablet, TAKE 1 TABLET BY MOUTH EVERY DAY AT BEDTIME AS NEEDED (Patient taking differently: Take 0.5 mg by mouth at bedtime.), Disp: 30 tablet, Rfl: 5   Ascorbic Acid (VITAMIN C PO), Take 1 tablet by mouth daily., Disp: , Rfl:    aspirin EC 81 MG tablet, Take 81 mg by mouth daily., Disp: , Rfl:    budesonide-formoterol (SYMBICORT) 160-4.5 MCG/ACT inhaler, INHALE TWO PUFFS BY MOUTH TWICE DAILY ** STOP SPIRIVA **, Disp: 1 each, Rfl: 6   docusate sodium (COLACE) 100 MG capsule, Take 100 mg by mouth 2 (two) times daily as needed for mild constipation., Disp: , Rfl:    dorzolamide-timolol (COSOPT) 22.3-6.8 MG/ML ophthalmic solution, Place 1 drop into both eyes 2 (two) times daily., Disp: 10 mL, Rfl: 0   ipratropium-albuterol (DUONEB) 0.5-2.5 (3) MG/3ML SOLN, USE 3 ML VIA NEBULIZER TWICE DAILY, Disp: 180 mL, Rfl: 1   mirtazapine (REMERON SOL-TAB) 15 MG disintegrating tablet, Take 1 tablet (15 mg total) by mouth at bedtime., Disp: 30 tablet, Rfl: 5   Multiple  Vitamins-Minerals (IMMUNE SUPPORT PO), Take 1 tablet by mouth daily., Disp: , Rfl:    nitroGLYCERIN (NITROSTAT) 0.4 MG SL tablet, 1 sl q 5 min prn chest pressure as directed (Patient taking differently: Place 0.4 mg under the tongue every 5 (five) minutes x 3 doses as needed for chest pain.), Disp: 50 tablet, Rfl: 3   pravastatin (PRAVACHOL) 40 MG tablet, Take 1 tablet (40 mg total) by mouth at bedtime., Disp: 90 tablet, Rfl: 1   Saw Palmetto 450 MG CAPS, Take 450 mg by mouth in the morning and at bedtime., Disp: , Rfl:   Past Medical History: Past Medical History:  Diagnosis Date   Anxiety    Cataracts, bilateral    removed by surgery   COPD (chronic obstructive pulmonary disease) (HCC)    Depression, major, single episode, moderate ([place]) 12/18/2018   Glaucoma    both eyes   Glaucoma    bilateral   H. pylori infection 09/17/2019   S/p treatment with Prevpac. Documented eradiation via breath test on 11/03/19.    Hyperlipidemia    Hypertension    Pre-diabetes    diet controlled   Skin cancer, basal cell    arms, face   Wears dentures    full    Tobacco Use: Social History   Tobacco Use  Smoking Status Former   Packs/day: 1.00   Years: 66.00   Total pack years: 66.00   Types: Cigarettes   Quit date: 11/09/2020   Years since quitting: 1.1  Smokeless  Tobacco Never  Tobacco Comments   started smoking age 65    Labs: Review Flowsheet  More data exists      Latest Ref Rng & Units 09/11/2017 01/20/2019 09/19/2020 08/21/2021 11/09/2021  Labs for ITP Cardiac and Pulmonary Rehab  Cholestrol 100 - 199 mg/dL 141  140  171  145  -  LDL (calc) 0 - 99 mg/dL 71  63  98  70  -  HDL-C >39 mg/dL 41  37  51  54  -  Trlycerides 0 - 149 mg/dL 146  198  125  119  -  Hemoglobin A1c 4.8 - 5.6 % - - - - 6.0     Capillary Blood Glucose: Lab Results  Component Value Date   GLUCAP 80 12/13/2020     Pulmonary Assessment Scores:  Pulmonary Assessment Scores     Row Name 11/13/21 1252          ADL UCSD   ADL Phase Entry     SOB Score total 34     Rest 0     Walk 2     Stairs 4     Bath 1     Dress 0     Shop 2       CAT Score   CAT Score 13       mMRC Score   mMRC Score 3             UCSD: Self-administered rating of dyspnea associated with activities of daily living (ADLs) 6-point scale (0 = "not at all" to 5 = "maximal or unable to do because of breathlessness")  Scoring Scores range from 0 to 120.  Minimally important difference is 5 units  CAT: CAT can identify the health impairment of COPD patients and is better correlated with disease progression.  CAT has a scoring range of zero to 40. The CAT score is classified into four groups of low (less than 10), medium (10 - 20), high (21-30) and very high (31-40) based on the impact level of disease on health status. A CAT score over 10 suggests significant symptoms.  A worsening CAT score could be explained by an exacerbation, poor medication adherence, poor inhaler technique, or progression of COPD or comorbid conditions.  CAT MCID is 2 points  mMRC: mMRC (Modified Medical Research Council) Dyspnea Scale is used to assess the degree of baseline functional disability in patients of respiratory disease due to dyspnea. No minimal important difference is established. A decrease in score of 1 point or greater is considered a positive change.   Pulmonary Function Assessment:   Exercise Target Goals: Exercise Program Goal: Individual exercise prescription set using results from initial 6 min walk test and THRR while considering  patient's activity barriers and safety.   Exercise Prescription Goal: Initial exercise prescription builds to 30-45 minutes a day of aerobic activity, 2-3 days per week.  Home exercise guidelines will be given to patient during program as part of exercise prescription that the participant will acknowledge.  Activity Barriers & Risk Stratification:  Activity Barriers & Cardiac Risk  Stratification - 11/13/21 1253       Activity Barriers & Cardiac Risk Stratification   Activity Barriers Back Problems;Deconditioning    Cardiac Risk Stratification Low             6 Minute Walk:  6 Minute Walk     Row Name 11/13/21 1349         6 Minute Walk  Phase Initial     Distance 900 feet     Walk Time 6 minutes     # of Rest Breaks 1     MPH 1.7     METS 2.17     RPE 12     Perceived Dyspnea  11     VO2 Peak 7.58     Symptoms Yes (comment)     Comments 1 minute standing rest due to desaturation and to put on nasal canula     Resting HR 75 bpm     Resting BP 130/62     Resting Oxygen Saturation  91 %     Exercise Oxygen Saturation  during 6 min walk 81 %     Max Ex. HR 95 bpm     Max Ex. BP 150/68     2 Minute Post BP 118/68       Interval HR   1 Minute HR 88     2 Minute HR 91     3 Minute HR 88     4 Minute HR 84     5 Minute HR 91     6 Minute HR 95     2 Minute Post HR 80     Interval Heart Rate? Yes       Interval Oxygen   Interval Oxygen? Yes     Baseline Oxygen Saturation % 91 %     1 Minute Oxygen Saturation % 90 %     1 Minute Liters of Oxygen 0 L     2 Minute Oxygen Saturation % 92 %     2 Minute Liters of Oxygen 0 L     3 Minute Oxygen Saturation % 81 %     3 Minute Liters of Oxygen 0 L     4 Minute Oxygen Saturation % 88 %     4 Minute Liters of Oxygen 2 L     5 Minute Oxygen Saturation % 92 %     5 Minute Liters of Oxygen 2 L     6 Minute Oxygen Saturation % 92 %     6 Minute Liters of Oxygen 2 L     2 Minute Post Oxygen Saturation % 94 %     2 Minute Post Liters of Oxygen 2 L              Oxygen Initial Assessment:  Oxygen Initial Assessment - 11/13/21 1348       Initial 6 min Walk   Oxygen Used Continuous    Liters per minute 2      Program Oxygen Prescription   Program Oxygen Prescription Continuous    Liters per minute 2    Comments --      Intervention   Short Term Goals To learn and understand  importance of monitoring SPO2 with pulse oximeter and demonstrate accurate use of the pulse oximeter.;To learn and understand importance of maintaining oxygen saturations>88%;To learn and demonstrate proper pursed lip breathing techniques or other breathing techniques.     Long  Term Goals Compliance with respiratory medication;Exhibits proper breathing techniques, such as pursed lip breathing or other method taught during program session;Maintenance of O2 saturations>88%;Verbalizes importance of monitoring SPO2 with pulse oximeter and return demonstration             Oxygen Re-Evaluation:  Oxygen Re-Evaluation     Row Name 11/22/21 0713 12/19/21 1500 01/17/22 0705         Program Oxygen  Prescription   Program Oxygen Prescription Continuous Continuous Continuous     Liters per minute '2 2 2       '$ Home Oxygen   Home Oxygen Device Home Concentrator;E-Tanks Home Concentrator;E-Tanks Home Concentrator;E-Tanks     Sleep Oxygen Prescription Continuous Continuous Continuous     Liters per minute '2 2 2     '$ Home Exercise Oxygen Prescription None None None     Home Resting Oxygen Prescription None None None     Compliance with Home Oxygen Use Yes Yes Yes       Goals/Expected Outcomes   Short Term Goals To learn and understand importance of monitoring SPO2 with pulse oximeter and demonstrate accurate use of the pulse oximeter.;To learn and understand importance of maintaining oxygen saturations>88%;To learn and demonstrate proper pursed lip breathing techniques or other breathing techniques.  To learn and understand importance of monitoring SPO2 with pulse oximeter and demonstrate accurate use of the pulse oximeter.;To learn and understand importance of maintaining oxygen saturations>88%;To learn and demonstrate proper pursed lip breathing techniques or other breathing techniques.  To learn and understand importance of monitoring SPO2 with pulse oximeter and demonstrate accurate use of the pulse  oximeter.;To learn and understand importance of maintaining oxygen saturations>88%;To learn and demonstrate proper pursed lip breathing techniques or other breathing techniques.      Long  Term Goals Compliance with respiratory medication;Exhibits proper breathing techniques, such as pursed lip breathing or other method taught during program session;Maintenance of O2 saturations>88%;Verbalizes importance of monitoring SPO2 with pulse oximeter and return demonstration Compliance with respiratory medication;Exhibits proper breathing techniques, such as pursed lip breathing or other method taught during program session;Maintenance of O2 saturations>88%;Verbalizes importance of monitoring SPO2 with pulse oximeter and return demonstration Compliance with respiratory medication;Exhibits proper breathing techniques, such as pursed lip breathing or other method taught during program session;Maintenance of O2 saturations>88%;Verbalizes importance of monitoring SPO2 with pulse oximeter and return demonstration     Goals/Expected Outcomes compliance compliance compliance              Oxygen Discharge (Final Oxygen Re-Evaluation):  Oxygen Re-Evaluation - 01/17/22 0705       Program Oxygen Prescription   Program Oxygen Prescription Continuous    Liters per minute 2      Home Oxygen   Home Oxygen Device Home Concentrator;E-Tanks    Sleep Oxygen Prescription Continuous    Liters per minute 2    Home Exercise Oxygen Prescription None    Home Resting Oxygen Prescription None    Compliance with Home Oxygen Use Yes      Goals/Expected Outcomes   Short Term Goals To learn and understand importance of monitoring SPO2 with pulse oximeter and demonstrate accurate use of the pulse oximeter.;To learn and understand importance of maintaining oxygen saturations>88%;To learn and demonstrate proper pursed lip breathing techniques or other breathing techniques.     Long  Term Goals Compliance with respiratory  medication;Exhibits proper breathing techniques, such as pursed lip breathing or other method taught during program session;Maintenance of O2 saturations>88%;Verbalizes importance of monitoring SPO2 with pulse oximeter and return demonstration    Goals/Expected Outcomes compliance             Initial Exercise Prescription:  Initial Exercise Prescription - 11/13/21 1300       Date of Initial Exercise RX and Referring Provider   Date 11/13/21    Referring Provider Dr. Elsworth Soho    Expected Discharge Date 03/20/22      Oxygen   Oxygen Continuous  Liters 2    Maintain Oxygen Saturation 88% or higher      Treadmill   MPH 1    Grade 0    Minutes 17      NuStep   Level 1    SPM 80    Minutes 22      Prescription Details   Frequency (times per week) 2    Duration Progress to 30 minutes of continuous aerobic without signs/symptoms of physical distress      Intensity   THRR 40-80% of Max Heartrate 55-110    Ratings of Perceived Exertion 11-13    Perceived Dyspnea 0-4      Resistance Training   Training Prescription Yes    Weight 3    Reps 10-15             Perform Capillary Blood Glucose checks as needed.  Exercise Prescription Changes:   Exercise Prescription Changes     Row Name 11/21/21 1500 12/05/21 1500 12/12/21 1500 12/19/21 1500 01/04/22 1600     Response to Exercise   Blood Pressure (Admit) 120/60 132/78 -- 118/72 124/62   Blood Pressure (Exercise) 118/74 140/78 -- 132/62 158/76   Blood Pressure (Exit) 110/68 -- -- 138/72 110/70   Heart Rate (Admit) 74 bpm 78 bpm -- 76 bpm 75 bpm   Heart Rate (Exercise) 87 bpm 95 bpm -- 91 bpm 104 bpm   Heart Rate (Exit) 71 bpm -- -- 70 bpm 95 bpm   Oxygen Saturation (Admit) 92 % 90 % -- 92 % 91 %   Oxygen Saturation (Exercise) 92 % 92 % -- 90 % 92 %   Oxygen Saturation (Exit) 97 % -- -- 90 % 92 %   Rating of Perceived Exertion (Exercise) 12 12 -- 12 12   Perceived Dyspnea (Exercise) 12 12 -- 12 12   Duration  Continue with 30 min of aerobic exercise without signs/symptoms of physical distress. Continue with 30 min of aerobic exercise without signs/symptoms of physical distress. -- Continue with 30 min of aerobic exercise without signs/symptoms of physical distress. Continue with 30 min of aerobic exercise without signs/symptoms of physical distress.   Intensity THRR unchanged THRR unchanged -- THRR unchanged THRR unchanged     Progression   Progression Continue to progress workloads to maintain intensity without signs/symptoms of physical distress. Continue to progress workloads to maintain intensity without signs/symptoms of physical distress. -- Continue to progress workloads to maintain intensity without signs/symptoms of physical distress. Continue to progress workloads to maintain intensity without signs/symptoms of physical distress.     Resistance Training   Training Prescription Yes Yes -- Yes Yes   Weight 3 4 -- 4 5   Reps 10-15 10-15 -- 10-15 10-15   Time 10 Minutes 10 Minutes -- 10 Minutes 10 Minutes     Oxygen   Oxygen Continuous Continuous -- Continuous Continuous   Liters 2 2 -- 2 2     Treadmill   MPH 1 1.5 -- 1.6 1.6   Grade 0 0 -- 0 0   Minutes 22 22 -- 22 22   METs 1.77 2.64 -- 2.23 2.23     NuStep   Level 1 1 -- 1 --   SPM 83 87 -- 79 --   Minutes 17 17 -- 17 --   METs 1.9 1.9 -- 1.9 --     Recumbant Elliptical   Level -- -- -- -- 2   RPM -- -- -- -- 36  Minutes -- -- -- -- 17   METs -- -- -- -- 2.2     Home Exercise Plan   Plans to continue exercise at -- -- Home (comment) -- --   Frequency -- -- Add 3 additional days to program exercise sessions. -- --   Initial Home Exercises Provided -- -- 12/12/21 -- --     Oxygen   Maintain Oxygen Saturation 88% or higher -- -- -- --    Row Name 01/16/22 1500             Response to Exercise   Blood Pressure (Admit) 122/72       Blood Pressure (Exercise) 130/70       Blood Pressure (Exit) 130/70       Heart  Rate (Admit) 85 bpm       Heart Rate (Exercise) 106 bpm       Heart Rate (Exit) 96 bpm       Oxygen Saturation (Admit) 90 %       Oxygen Saturation (Exercise) 88 %       Oxygen Saturation (Exit) 93 %       Rating of Perceived Exertion (Exercise) 13       Perceived Dyspnea (Exercise) 13       Duration Continue with 30 min of aerobic exercise without signs/symptoms of physical distress.       Intensity THRR unchanged         Progression   Progression Continue to progress workloads to maintain intensity without signs/symptoms of physical distress.         Resistance Training   Training Prescription Yes       Weight 5       Reps 10-15       Time 10 Minutes         Oxygen   Oxygen Continuous       Liters 2         Treadmill   MPH 1.7       Grade 0       Minutes 22       METs 2.3         Recumbant Elliptical   Level 2       RPM 42       Minutes 17       METs 2.7         Oxygen   Maintain Oxygen Saturation 88% or higher                Exercise Comments:   Exercise Comments     Row Name 12/12/21 1537           Exercise Comments home exercised reviewed                Exercise Goals and Review:   Exercise Goals     Row Name 11/13/21 1355 11/22/21 0716 12/19/21 1500 01/17/22 0708       Exercise Goals   Increase Physical Activity Yes Yes Yes Yes    Intervention Provide advice, education, support and counseling about physical activity/exercise needs.;Develop an individualized exercise prescription for aerobic and resistive training based on initial evaluation findings, risk stratification, comorbidities and participant's personal goals. Provide advice, education, support and counseling about physical activity/exercise needs.;Develop an individualized exercise prescription for aerobic and resistive training based on initial evaluation findings, risk stratification, comorbidities and participant's personal goals. Provide advice, education, support and  counseling about physical activity/exercise needs.;Develop an individualized exercise prescription for aerobic  and resistive training based on initial evaluation findings, risk stratification, comorbidities and participant's personal goals. Provide advice, education, support and counseling about physical activity/exercise needs.;Develop an individualized exercise prescription for aerobic and resistive training based on initial evaluation findings, risk stratification, comorbidities and participant's personal goals.    Expected Outcomes Short Term: Attend rehab on a regular basis to increase amount of physical activity.;Long Term: Add in home exercise to make exercise part of routine and to increase amount of physical activity.;Long Term: Exercising regularly at least 3-5 days a week. Short Term: Attend rehab on a regular basis to increase amount of physical activity.;Long Term: Add in home exercise to make exercise part of routine and to increase amount of physical activity.;Long Term: Exercising regularly at least 3-5 days a week. Short Term: Attend rehab on a regular basis to increase amount of physical activity.;Long Term: Add in home exercise to make exercise part of routine and to increase amount of physical activity.;Long Term: Exercising regularly at least 3-5 days a week. Short Term: Attend rehab on a regular basis to increase amount of physical activity.;Long Term: Add in home exercise to make exercise part of routine and to increase amount of physical activity.;Long Term: Exercising regularly at least 3-5 days a week.    Increase Strength and Stamina Yes Yes Yes Yes    Intervention Provide advice, education, support and counseling about physical activity/exercise needs.;Develop an individualized exercise prescription for aerobic and resistive training based on initial evaluation findings, risk stratification, comorbidities and participant's personal goals. Provide advice, education, support and  counseling about physical activity/exercise needs.;Develop an individualized exercise prescription for aerobic and resistive training based on initial evaluation findings, risk stratification, comorbidities and participant's personal goals. Provide advice, education, support and counseling about physical activity/exercise needs.;Develop an individualized exercise prescription for aerobic and resistive training based on initial evaluation findings, risk stratification, comorbidities and participant's personal goals. Provide advice, education, support and counseling about physical activity/exercise needs.;Develop an individualized exercise prescription for aerobic and resistive training based on initial evaluation findings, risk stratification, comorbidities and participant's personal goals.    Expected Outcomes Short Term: Increase workloads from initial exercise prescription for resistance, speed, and METs.;Short Term: Perform resistance training exercises routinely during rehab and add in resistance training at home;Long Term: Improve cardiorespiratory fitness, muscular endurance and strength as measured by increased METs and functional capacity (6MWT) Short Term: Increase workloads from initial exercise prescription for resistance, speed, and METs.;Short Term: Perform resistance training exercises routinely during rehab and add in resistance training at home;Long Term: Improve cardiorespiratory fitness, muscular endurance and strength as measured by increased METs and functional capacity (6MWT) Short Term: Increase workloads from initial exercise prescription for resistance, speed, and METs.;Short Term: Perform resistance training exercises routinely during rehab and add in resistance training at home;Long Term: Improve cardiorespiratory fitness, muscular endurance and strength as measured by increased METs and functional capacity (6MWT) Short Term: Increase workloads from initial exercise prescription for  resistance, speed, and METs.;Short Term: Perform resistance training exercises routinely during rehab and add in resistance training at home;Long Term: Improve cardiorespiratory fitness, muscular endurance and strength as measured by increased METs and functional capacity (6MWT)    Able to understand and use rate of perceived exertion (RPE) scale Yes Yes Yes Yes    Intervention Provide education and explanation on how to use RPE scale Provide education and explanation on how to use RPE scale Provide education and explanation on how to use RPE scale Provide education and explanation on how to  use RPE scale    Expected Outcomes Short Term: Able to use RPE daily in rehab to express subjective intensity level;Long Term:  Able to use RPE to guide intensity level when exercising independently Short Term: Able to use RPE daily in rehab to express subjective intensity level;Long Term:  Able to use RPE to guide intensity level when exercising independently Short Term: Able to use RPE daily in rehab to express subjective intensity level;Long Term:  Able to use RPE to guide intensity level when exercising independently Short Term: Able to use RPE daily in rehab to express subjective intensity level;Long Term:  Able to use RPE to guide intensity level when exercising independently    Able to understand and use Dyspnea scale Yes Yes Yes Yes    Intervention Provide education and explanation on how to use Dyspnea scale Provide education and explanation on how to use Dyspnea scale Provide education and explanation on how to use Dyspnea scale Provide education and explanation on how to use Dyspnea scale    Expected Outcomes Short Term: Able to use Dyspnea scale daily in rehab to express subjective sense of shortness of breath during exertion;Long Term: Able to use Dyspnea scale to guide intensity level when exercising independently Short Term: Able to use Dyspnea scale daily in rehab to express subjective sense of shortness of  breath during exertion;Long Term: Able to use Dyspnea scale to guide intensity level when exercising independently Short Term: Able to use Dyspnea scale daily in rehab to express subjective sense of shortness of breath during exertion;Long Term: Able to use Dyspnea scale to guide intensity level when exercising independently Short Term: Able to use Dyspnea scale daily in rehab to express subjective sense of shortness of breath during exertion;Long Term: Able to use Dyspnea scale to guide intensity level when exercising independently    Knowledge and understanding of Target Heart Rate Range (THRR) Yes Yes Yes Yes    Intervention Provide education and explanation of THRR including how the numbers were predicted and where they are located for reference Provide education and explanation of THRR including how the numbers were predicted and where they are located for reference Provide education and explanation of THRR including how the numbers were predicted and where they are located for reference Provide education and explanation of THRR including how the numbers were predicted and where they are located for reference    Expected Outcomes Short Term: Able to state/look up THRR;Long Term: Able to use THRR to govern intensity when exercising independently;Short Term: Able to use daily as guideline for intensity in rehab Short Term: Able to state/look up THRR;Long Term: Able to use THRR to govern intensity when exercising independently;Short Term: Able to use daily as guideline for intensity in rehab Short Term: Able to state/look up THRR;Long Term: Able to use THRR to govern intensity when exercising independently;Short Term: Able to use daily as guideline for intensity in rehab Short Term: Able to state/look up THRR;Long Term: Able to use THRR to govern intensity when exercising independently;Short Term: Able to use daily as guideline for intensity in rehab    Understanding of Exercise Prescription Yes Yes Yes Yes     Intervention Provide education, explanation, and written materials on patient's individual exercise prescription Provide education, explanation, and written materials on patient's individual exercise prescription Provide education, explanation, and written materials on patient's individual exercise prescription Provide education, explanation, and written materials on patient's individual exercise prescription    Expected Outcomes Long Term: Able to explain home  exercise prescription to exercise independently;Short Term: Able to explain program exercise prescription Long Term: Able to explain home exercise prescription to exercise independently;Short Term: Able to explain program exercise prescription Long Term: Able to explain home exercise prescription to exercise independently;Short Term: Able to explain program exercise prescription Long Term: Able to explain home exercise prescription to exercise independently;Short Term: Able to explain program exercise prescription             Exercise Goals Re-Evaluation :  Exercise Goals Re-Evaluation     [place] Name 11/22/21 0716 12/19/21 1500 01/17/22 0708         Exercise Goal Re-Evaluation   Exercise Goals Review Increase Physical Activity;Able to understand and use Dyspnea scale;Understanding of Exercise Prescription;Knowledge and understanding of Target Heart Rate Range (THRR);Increase Strength and Stamina;Able to understand and use rate of perceived exertion (RPE) scale Increase Physical Activity;Increase Strength and Stamina;Able to understand and use rate of perceived exertion (RPE) scale;Able to understand and use Dyspnea scale;Knowledge and understanding of Target Heart Rate Range (THRR);Understanding of Exercise Prescription Increase Physical Activity;Increase Strength and Stamina;Able to understand and use rate of perceived exertion (RPE) scale;Able to understand and use Dyspnea scale;Knowledge and understanding of Target Heart Rate Range  (THRR);Understanding of Exercise Prescription     Comments Pt has completed 2 exercise sessions of PR. He is motivated to improve himself. He has been 1 week cigarette free and reports that he is feeling much better due to this. He is currently exercising at 1.9 METs on the stepper. Will continue to monitor and progress as able. Pt has completed 10 sessions of PR. He continues to be motivated when in class and is pushing himself. He is currently exercising at 2.23 METs on the treadmill. Will monitor and progress as able. Pt has completed 16 exercise sessions of pulmonary rehab. He continues to be cigarette free. He continues to be motivated and to push himself in class. He is now doing some exercise at home, even though he does not wear his oxygen at home. He is currently exercising at 2.3 METs on the treadmill. Will continue to monitor and progress as able.     Expected Outcomes Through exercise at rehab and at home, the patient will meet their stated goals. Through exercise at rehab and at home, the patient will meet their stated goals. Through exercise at rehab and at home, the patient will meet their stated goals.              Discharge Exercise Prescription (Final Exercise Prescription Changes):  Exercise Prescription Changes - 01/16/22 1500       Response to Exercise   Blood Pressure (Admit) 122/72    Blood Pressure (Exercise) 130/70    Blood Pressure (Exit) 130/70    Heart Rate (Admit) 85 bpm    Heart Rate (Exercise) 106 bpm    Heart Rate (Exit) 96 bpm    Oxygen Saturation (Admit) 90 %    Oxygen Saturation (Exercise) 88 %    Oxygen Saturation (Exit) 93 %    Rating of Perceived Exertion (Exercise) 13    Perceived Dyspnea (Exercise) 13    Duration Continue with 30 min of aerobic exercise without signs/symptoms of physical distress.    Intensity THRR unchanged      Progression   Progression Continue to progress workloads to maintain intensity without signs/symptoms of physical  distress.      Resistance Training   Training Prescription Yes    Weight 5    Reps 10-15  Time 10 Minutes      Oxygen   Oxygen Continuous    Liters 2      Treadmill   MPH 1.7    Grade 0    Minutes 22    METs 2.3      Recumbant Elliptical   Level 2    RPM 42    Minutes 17    METs 2.7      Oxygen   Maintain Oxygen Saturation 88% or higher             Nutrition:  Target Goals: Understanding of nutrition guidelines, daily intake of sodium '1500mg'$ , cholesterol '200mg'$ , calories 30% from fat and 7% or less from saturated fats, daily to have 5 or more servings of fruits and vegetables.  Biometrics:  Pre Biometrics - 11/13/21 1355       Pre Biometrics   Height '5\' 8"'$  (1.727 m)    Weight 56.3 kg    Waist Circumference 35.5 inches    Hip Circumference 36 inches    Waist to Hip Ratio 0.99 %    BMI (Calculated) 18.88    Triceps Skinfold 16 mm    % Body Fat 23.1 %    Grip Strength 27.6 kg    Flexibility 0 in    Single Leg Stand 8 seconds              Nutrition Therapy Plan and Nutrition Goals:  Nutrition Therapy & Goals - 01/09/22 1437       Personal Nutrition Goals   Comments Handout provided and discussed regarding healthier choices.  We provide 2 educational sessions on heart healthy nutrition with handouts. We also provide assistance with RD referal if patient is interested.      Intervention Plan   Intervention Nutrition handout(s) given to patient.    Expected Outcomes Short Term Goal: Understand basic principles of dietary content, such as calories, fat, sodium, cholesterol and nutrients.             Nutrition Assessments:  Nutrition Assessments - 11/13/21 1300       MEDFICTS Scores   Pre Score 36            MEDIFICTS Score Key: ?70 Need to make dietary changes  40-70 Heart Healthy Diet ? 40 Therapeutic Level Cholesterol Diet   Picture Your Plate Scores: <97 Unhealthy dietary pattern with much room for improvement. 41-50  Dietary pattern unlikely to meet recommendations for good health and room for improvement. 51-60 More healthful dietary pattern, with some room for improvement.  >60 Healthy dietary pattern, although there may be some specific behaviors that could be improved.    Nutrition Goals Re-Evaluation:  Nutrition Goals Re-Evaluation     [place] Name 12/15/21 1032             Goals   Comment We provide 2 educational sessions on heart healthy nutrition with handouts and assistance with RD referrals if patient is interested.       Expected Outcome Handout provided and explained regarding heathier choices.  Patient verbalized understanding.                Nutrition Goals Discharge (Final Nutrition Goals Re-Evaluation):  Nutrition Goals Re-Evaluation - 12/15/21 1032       Goals   Comment We provide 2 educational sessions on heart healthy nutrition with handouts and assistance with RD referrals if patient is interested.    Expected Outcome Handout provided and explained regarding heathier choices.  Patient verbalized understanding.  Psychosocial: Target Goals: Acknowledge presence or absence of significant depression and/or stress, maximize coping skills, provide positive support system. Participant is able to verbalize types and ability to use techniques and skills needed for reducing stress and depression.  Initial Review & Psychosocial Screening:  Initial Psych Review & Screening - 11/13/21 1406       Initial Review   Current issues with Current Anxiety/Panic;Current Depression      Family Dynamics   Good Support System? Yes    Comments His support system is his friend, Terri Piedra. He reports having a good relationship with his daughter and ex wife as well.      Barriers   Psychosocial barriers to participate in program The patient should benefit from training in stress management and relaxation.      Screening Interventions   Interventions Encouraged to exercise     Expected Outcomes Long Term goal: The participant improves quality of Life and PHQ9 Scores as seen by post scores and/or verbalization of changes;Short Term goal: Identification and review with participant of any Quality of Life or Depression concerns found by scoring the questionnaire.             Quality of Life Scores:  Quality of Life - 11/13/21 1356       Quality of Life   Select Quality of Life      Quality of Life Scores   Health/Function Pre 19.96 %    Socioeconomic Pre 25.75 %    Psych/Spiritual Pre 29 %    Family Pre 30 %    GLOBAL Pre 24.57 %            Scores of 19 and below usually indicate a poorer quality of life in these areas.  A difference of  2-3 points is a clinically meaningful difference.  A difference of 2-3 points in the total score of the Quality of Life Index has been associated with significant improvement in overall quality of life, self-image, physical symptoms, and general health in studies assessing change in quality of life.   PHQ-9: Review Flowsheet  More data exists      11/13/2021 04/25/2021 01/23/2021 12/09/2020 09/12/2020  Depression screen PHQ 2/9  Decreased Interest 0 1 2 0 0  Down, Depressed, Hopeless 0 1 0 0 0  PHQ - 2 Score 0 2 2 0 0  Altered sleeping 0 0 0 - 0  Tired, decreased energy '1 1 1 '$ - 0  Change in appetite '1 2 1 '$ - 3  Feeling bad or failure about yourself  0 0 0 - 0  Trouble concentrating 0 0 0 - 0  Moving slowly or fidgety/restless 0 0 0 - 0  Suicidal thoughts 0 0 0 - 0  PHQ-9 Score '2 5 4 '$ - 3  Difficult doing work/chores Not difficult at all Not difficult at all Not difficult at all - Not difficult at all   Interpretation of Total Score  Total Score Depression Severity:  1-4 = Minimal depression, 5-9 = Mild depression, 10-14 = Moderate depression, 15-19 = Moderately severe depression, 20-27 = Severe depression   Psychosocial Evaluation and Intervention:  Psychosocial Evaluation - 11/13/21 1358       Psychosocial  Evaluation & Interventions   Interventions Encouraged to exercise with the program and follow exercise prescription;Relaxation education;Stress management education    Comments Pt has no barriers to participating in PR. He has no identifiable psychosocial issues, but he is on mirtazapine 15 mg and alprazolam 0.5 mg for depression  and anxiety. When questioned about his anxiety and depression he denies having both, and is reports that Dr. Wolfgang Phoenix put him on these about 4 years ago. He states that he is on these medications because he is "hyperactive and nervous". He scored a 2 on his PHQ-9 and this relates to his lack of energy from his lung disease and his poor appetite. He has been trying for some time to gain weight, but has been unable to do so. He has smoked cigarettes for about 70 years ranging from 1/4 pack per day to a pack per day. He has recently been smoking 1/4 pack per day. He reports that he quit smoking as of today. He has tried to quit smoking over 20 times but has been able to do so. He is using nicotine lozenges to aid his smoking cessation. He is motivated to quit and is hopeful that this will finally be the time he is able to quit for good. He reports that he has a good support system. He has a group of friends that he keeps up with. Both of his children and his ex-wife live in [place], and he reports that he speaks with them frequently. His goals while in the program are to stay cigarette free and to decrease his SOB with exertion. He is eager to begin the program.    Expected Outcomes Pt will continue to have no identifiable psychosocial issues and his anxiety and depression will continue to be treated.    Continue Psychosocial Services  No Follow up required             Psychosocial Re-Evaluation:  Psychosocial Re-Evaluation     [place] Name 11/20/21 0729 12/12/21 0747 12/14/21 0824 12/15/21 1019 01/09/22 1427     Psychosocial Re-Evaluation   Current issues with History of  Depression History of Depression -- History of Depression History of Depression   Comments Patient is new to the program completing 2 sessions. He does have a history of major depression and anxiety which is managed with mirtazapine and Alprazolam. He seems to enjoy coming to the program and demonstrates an interest in improving his health. We will continue to monitor his progress. Patient is new to the program completing 8 sessions. He does have a history of major depression and anxiety which is managed with mirtazapine and Alprazolam. He seems to enjoy coming to the program and demonstrates an interest in improving his health. We will continue to monitor his progress. Patient completed 8 sessions. He does have a history of major depression and anxiety which is managed with mirtazapine and Alprazolam. He seems to enjoy coming to the program and demonstrates an interest in improving his health. We will continue to monitor his progress. Patient has completed 9 sessions. He does have a history of major depression and anxiety which is managed with mirtazapine and Alprazolam. He seems to enjoy coming to the program and demonstrates an interest in improving his health with a positive outlook.  We will continue to monitor his progress as he progress in the program. Patient has completed 14 sessions. He does have a history of major depression and anxiety which is managed with mirtazapine and Alprazolam. He seems to enjoy coming to the program and demonstrates an interest in improving his health with a positive outlook.  Patient is very sociable and enjoys talking with the class and staff .  We will continue to monitor his progress as he progress in the program.   Expected Outcomes Patient will  continue to have no psychosocial barriers identified and his depression and anxiety will continue to be managed. Patient will continue to have no psychosocial barriers identified and his depression and anxiety will continue to be  managed. -- Patient will continue to have no psychosocial barriers identified at discharge. Patient will continue to have no psychosocial barriers identified at discharge.   Interventions Stress management education;Encouraged to attend Pulmonary Rehabilitation for the exercise;Relaxation education Stress management education;Encouraged to attend Pulmonary Rehabilitation for the exercise;Relaxation education -- Stress management education;Encouraged to attend Pulmonary Rehabilitation for the exercise;Relaxation education Stress management education;Encouraged to attend Pulmonary Rehabilitation for the exercise;Relaxation education   Continue Psychosocial Services  No Follow up required No Follow up required -- No Follow up required No Follow up required            Psychosocial Discharge (Final Psychosocial Re-Evaluation):  Psychosocial Re-Evaluation - 01/09/22 1427       Psychosocial Re-Evaluation   Current issues with History of Depression    Comments Patient has completed 14 sessions. He does have a history of major depression and anxiety which is managed with mirtazapine and Alprazolam. He seems to enjoy coming to the program and demonstrates an interest in improving his health with a positive outlook.  Patient is very sociable and enjoys talking with the class and staff .  We will continue to monitor his progress as he progress in the program.    Expected Outcomes Patient will continue to have no psychosocial barriers identified at discharge.    Interventions Stress management education;Encouraged to attend Pulmonary Rehabilitation for the exercise;Relaxation education    Continue Psychosocial Services  No Follow up required              Education: Education Goals: Education classes will be provided on a weekly basis, covering required topics. Participant will state understanding/return demonstration of topics presented.  Learning Barriers/Preferences:  Learning  Barriers/Preferences - 11/13/21 1301       Learning Barriers/Preferences   Learning Barriers None    Learning Preferences Audio;Computer/Internet;Group Instruction;Individual Instruction;Pictoral;Skilled Demonstration;Verbal Instruction;Video;Written Material             Education Topics: How Lungs Work and Diseases: - Discuss the anatomy of the lungs and diseases that can affect the lungs, such as COPD. Flowsheet Row PULMONARY REHAB OTHER RESPIRATORY from 01/11/2022 in [place]  Date 01/04/22  Educator pb  Instruction Review Code 1- Verbalizes Understanding       Exercise: -Discuss the importance of exercise, FITT principles of exercise, normal and abnormal responses to exercise, and how to exercise safely.   Environmental Irritants: -Discuss types of environmental irritants and how to limit exposure to environmental irritants. Flowsheet Row PULMONARY REHAB OTHER RESPIRATORY from 01/11/2022 in [place]  Date 01/11/22  Educator [place]  Instruction Review Code 1- Verbalizes Understanding       Meds/Inhalers and oxygen: - Discuss respiratory medications, definition of an inhaler and oxygen, and the proper way to use an inhaler and oxygen.   Energy Saving Techniques: - Discuss methods to conserve energy and decrease shortness of breath when performing activities of daily living.    Bronchial Hygiene / Breathing Techniques: - Discuss breathing mechanics, pursed-lip breathing technique,  proper posture, effective ways to clear airways, and other functional breathing techniques   Cleaning Equipment: - Provides group verbal and written instruction about the health risks of elevated stress, cause of high stress, and healthy ways to reduce stress.   Nutrition I: Fats: -  Discuss the types of cholesterol, what cholesterol does to the body, and how cholesterol levels can be controlled. Flowsheet Row PULMONARY REHAB OTHER  RESPIRATORY from 01/11/2022 in [place]  Date 11/16/21  Educator [place]  Instruction Review Code 1- Verbalizes Understanding       Nutrition II: Labels: -Discuss the different components of food labels and how to read food labels. Flowsheet Row PULMONARY REHAB OTHER RESPIRATORY from 01/11/2022 in [place]  Date 11/23/21  Educator DF  Instruction Review Code 1- Verbalizes Understanding       Respiratory Infections: - Discuss the signs and symptoms of respiratory infections, ways to prevent respiratory infections, and the importance of seeking medical treatment when having a respiratory infection. Flowsheet Row PULMONARY REHAB OTHER RESPIRATORY from 01/11/2022 in [place]  Date 11/30/21  Educator [place]  Instruction Review Code 1- Verbalizes Understanding       Stress I: Signs and Symptoms: - Discuss the causes of stress, how stress may lead to anxiety and depression, and ways to limit stress. Flowsheet Row PULMONARY REHAB OTHER RESPIRATORY from 01/11/2022 in [place]  Date 12/07/21  Educator [place]  Instruction Review Code 1- Verbalizes Understanding       Stress II: Relaxation: -Discuss relaxation techniques to limit stress.   Oxygen for Home/Travel: - Discuss how to prepare for travel when on oxygen and proper ways to transport and store oxygen to ensure safety.   Knowledge Questionnaire Score:  Knowledge Questionnaire Score - 11/13/21 1302       Knowledge Questionnaire Score   Pre Score 9/18             Core Components/Risk Factors/Patient Goals at Admission:  Personal Goals and Risk Factors at Admission - 11/13/21 1304       Core Components/Risk Factors/Patient Goals on Admission   Tobacco Cessation Yes    Number of packs per day 1/4 recently, but quit 11/13/2021    Intervention Assist the participant in steps to quit. Provide individualized education and counseling  about committing to Tobacco Cessation, relapse prevention, and pharmacological support that can be provided by physician.;Advice worker, assist with locating and accessing local/national Quit Smoking programs, and support quit date choice.    Expected Outcomes Long Term: Complete abstinence from all tobacco products for at least 12 months from quit date.;Short Term: Will quit all tobacco product use, adhering to prevention of relapse plan.;Short Term: Will demonstrate readiness to quit, by selecting a quit date.    Improve shortness of breath with ADL's Yes    Intervention Provide education, individualized exercise plan and daily activity instruction to help decrease symptoms of SOB with activities of daily living.    Expected Outcomes Short Term: Improve cardiorespiratory fitness to achieve a reduction of symptoms when performing ADLs;Long Term: Be able to perform more ADLs without symptoms or delay the onset of symptoms    Hypertension Yes    Intervention Provide education on lifestyle modifcations including regular physical activity/exercise, weight management, moderate sodium restriction and increased consumption of fresh fruit, vegetables, and low fat dairy, alcohol moderation, and smoking cessation.;Monitor prescription use compliance.    Expected Outcomes Short Term: Continued assessment and intervention until BP is < 140/85m HG in hypertensive participants. < 130/834mHG in hypertensive participants with diabetes, heart failure or chronic kidney disease.;Long Term: Maintenance of blood pressure at goal levels.    Lipids Yes    Intervention Provide education and support for participant on nutrition & aerobic/resistive  exercise along with prescribed medications to achieve LDL '70mg'$ , HDL >'40mg'$ .    Expected Outcomes Short Term: Participant states understanding of desired cholesterol values and is compliant with medications prescribed. Participant is following exercise prescription and  nutrition guidelines.;Long Term: Cholesterol controlled with medications as prescribed, with individualized exercise RX and with personalized nutrition plan. Value goals: LDL < '70mg'$ , HDL > 40 mg.             Core Components/Risk Factors/Patient Goals Review:   Goals and Risk Factor Review     Row Name 11/20/21 0733 12/12/21 0748 12/15/21 1037 01/09/22 1443       Core Components/Risk Factors/Patient Goals Review   Personal Goals Review Tobacco Cessation;Weight Management/Obesity;Improve shortness of breath with ADL's;Lipids;Hypertension;Other Tobacco Cessation;Weight Management/Obesity;Improve shortness of breath with ADL's;Lipids;Hypertension;Other Tobacco Cessation;Weight Management/Obesity;Improve shortness of breath with ADL's;Lipids;Hypertension;Other Tobacco Cessation;Improve shortness of breath with ADL's;Hypertension;Lipids    Review Patient was referred to PR wtih Centrilobuler emphysema and chronic respiratory failure. He has completed 1 session exercising on 2L with oxygen saturations running 95%. His personal goals for the program are to decrease his SOB with exertion and continue to abstain from smoking. We will continue to monitor his progress as he works towards meeting these goals. Patient was referred to PR wtih Centrilobuler emphysema and chronic respiratory failure. He has completed 8 session exercising on 2L with oxygen saturations running 95%. His personal goals for the program are to decrease his SOB with exertion and continue to abstain from smoking. We will continue to monitor his progress as he works towards meeting these goals. Patient has completed  8 session.  He is exercising on 2L with oxygen saturations running 90%-95%. We encourage pursed lipped breathing technique. He tolerated using the nu-step and treadmill without difficulty.  His personal goals for the program are to decrease his SOB with exertion and continue to abstain from smoking. We will continue to monitor  his progress as he works towards meeting these goals. Patient has completed  14 session.  He is exercising on 2L with oxygen saturations running 90%-93%, while exercising.  We encourage pursed lipped breathing technique. He tolerated using the nu-step and treadmill without difficulty.  His personal goals for the program are to decrease his SOB with exertion and continue to abstain from smoking. We will continue to monitor his progress as he works towards meeting these goals.    Expected Outcomes Patient will complete the program meeting both personal and program goals. Patient will complete the program meeting both personal and program goals. Patient will complete the program meeting both personal and program goals. Patient will complete the program meeting both personal and program goals.             Core Components/Risk Factors/Patient Goals at Discharge (Final Review):   Goals and Risk Factor Review - 01/09/22 1443       Core Components/Risk Factors/Patient Goals Review   Personal Goals Review Tobacco Cessation;Improve shortness of breath with ADL's;Hypertension;Lipids    Review Patient has completed  14 session.  He is exercising on 2L with oxygen saturations running 90%-93%, while exercising.  We encourage pursed lipped breathing technique. He tolerated using the nu-step and treadmill without difficulty.  His personal goals for the program are to decrease his SOB with exertion and continue to abstain from smoking. We will continue to monitor his progress as he works towards meeting these goals.    Expected Outcomes Patient will complete the program meeting both personal and program goals.  ITP Comments:   Comments: ITP REVIEW Pt is making expected progress toward pulmonary rehab goals after completing 17 sessions. Recommend continued exercise, life style modification, education, and utilization of breathing techniques to increase stamina and strength and decrease shortness  of breath with exertion.

## 2022-01-18 ENCOUNTER — Encounter (HOSPITAL_COMMUNITY)
Admission: RE | Admit: 2022-01-18 | Discharge: 2022-01-18 | Disposition: A | Discharge status: Home / Self Care | Payer: PPO | Source: Ambulatory Visit | Attending: Pulmonary Disease | Admitting: Pulmonary Disease

## 2022-01-18 DIAGNOSIS — J432 Centrilobular emphysema: Secondary | ICD-10-CM

## 2022-01-18 DIAGNOSIS — J9611 Chronic respiratory failure with hypoxia: Secondary | ICD-10-CM

## 2022-01-18 NOTE — Progress Notes (Signed)
Daily Session Note  Patient Details  Name: George Meyer MRN: [id number] Date of Birth: 08-04-1940 Referring Provider:   Flowsheet Row PULMONARY REHAB OTHER RESP ORIENTATION from 11/13/2021 in [place]  Referring Provider Dr. Elsworth Soho       Encounter Date: 01/18/2022  Check In:  Session Check In - 01/18/22 1458       Check-In   Supervising physician immediately available to respond to emergencies [facility] MD immediately available    Physician(s) Dr Zandra Abts    Location [facility]    Staff Present Kasie Leccese Hassell Done, RN, BSN;Heather Otho Ket, BS, Exercise Physiologist;Debra Wynetta Emery, RN, BSN    Virtual Visit No    Medication changes reported     No    Fall or balance concerns reported    No    Tobacco Cessation No Change    Warm-up and Cool-down Performed as group-led instruction    Resistance Training Performed Yes    VAD Patient? No    PAD/SET Patient? No      Pain Assessment   Currently in Pain? No/denies    Multiple Pain Sites No             Capillary Blood Glucose: No results found for this or any previous visit (from the past 24 hour(s)).    Social History   Tobacco Use  Smoking Status Former   Packs/day: 1.00   Years: 66.00   Total pack years: 66.00   Types: Cigarettes   Quit date: 11/09/2020   Years since quitting: 1.1  Smokeless Tobacco Never  Tobacco Comments   started smoking age 27    Goals Met:  Proper associated with RPD/PD & O2 Sat Independence with exercise equipment Using PLB without cueing & demonstrates good technique Exercise tolerated well Queuing for purse lip breathing No report of concerns or symptoms today Strength training completed today  Goals Unmet:  Not Applicable  Comments: checkout at 1600.   Dr. Kathie Dike is Medical Director for [facility].

## 2022-01-22 ENCOUNTER — Ambulatory Visit: Payer: PPO | Admitting: Pulmonary Disease

## 2022-01-22 ENCOUNTER — Encounter: Payer: Self-pay | Admitting: Pulmonary Disease

## 2022-01-22 ENCOUNTER — Other Ambulatory Visit: Payer: Self-pay

## 2022-01-22 DIAGNOSIS — J9611 Chronic respiratory failure with hypoxia: Secondary | ICD-10-CM | POA: Diagnosis not present

## 2022-01-22 DIAGNOSIS — J9612 Chronic respiratory failure with hypercapnia: Secondary | ICD-10-CM

## 2022-01-22 DIAGNOSIS — J432 Centrilobular emphysema: Secondary | ICD-10-CM

## 2022-01-22 MED ORDER — IPRATROPIUM-ALBUTEROL 0.5-2.5 (3) MG/3ML IN SOLN
RESPIRATORY_TRACT | 1 refills | Status: DC
Start: 2022-01-22 — End: 2022-02-27

## 2022-01-22 NOTE — Patient Instructions (Signed)
  X re schedule PFTs  Stay active please - Ok to join the [employer]

## 2022-01-22 NOTE — Progress Notes (Signed)
   Subjective:    Patient ID: George Meyer, male    DOB: 09/14/1940, 81 y.o.   MRN: [id number]  HPI  81 yo smoker with COPD Adm 08/2021 for COPD exac He smoked since age 80, more recently was about half pack per day, more than 50 pack years  Chief Complaint  Patient presents with   Follow-up    States his breathing has improved but has been bad for the last two days.  Does not want to continue pulm. Rehab    Breathing is doing better, he has good and bad days.  He is compliant with Symbicort.  On his bad days he takes albuterol and Atrovent nebs about 3 times daily. He is enrolled in pulmonary rehab and is able to use the bike, this is too expensive for him and he would like an alternative program. He uses oxygen only as needed  He reports 40 pound weight loss over the last 2 years   Significant tests/ events reviewed   HC03   09/23/21   = 40  LDCT chest 12/2018 moderate emphysema, right upper lobe 5 mm nodule stable since 2019  Review of Systems neg for any significant sore throat, dysphagia, itching, sneezing, nasal congestion or excess/ purulent secretions, fever, chills, sweats, unintended wt loss, pleuritic or exertional cp, hempoptysis, orthopnea pnd or change in chronic leg swelling. Also denies presyncope, palpitations, heartburn, abdominal pain, nausea, vomiting, diarrhea or change in bowel or urinary habits, dysuria,hematuria, rash, arthralgias, visual complaints, headache, numbness weakness or ataxia.     Objective:   Physical Exam  Gen. Pleasant, elderly, thin, in no distress ENT - no thrush, no pallor/icterus,no post nasal drip Neck: No JVD, no thyromegaly, no carotid bruits Lungs: no use of accessory muscles, no dullness to percussion, decreased without rales or rhonchi  Cardiovascular: Rhythm regular, heart sounds  normal, no murmurs or gallops, no peripheral edema Musculoskeletal: No deformities, no cyanosis or clubbing        Assessment & Plan:

## 2022-01-22 NOTE — Assessment & Plan Note (Signed)
Emphasize nocturnal oxygen use. Reschedule PFTs. [facility]

## 2022-01-22 NOTE — Assessment & Plan Note (Signed)
Continue Symbicort Okay to use DuoNebs for rescue, will provide refill. He was unable to afford combined triple therapy in the past We discussed signs and symptoms of COPD exacerbation and COPD action plan

## 2022-01-23 ENCOUNTER — Encounter (HOSPITAL_COMMUNITY): Payer: PPO

## 2022-01-23 NOTE — Addendum Note (Signed)
Addended by: Fritzi Mandes D on: 01/23/2022 03:28 PM   Modules accepted: Orders

## 2022-01-25 ENCOUNTER — Encounter (HOSPITAL_COMMUNITY): Payer: PPO

## 2022-01-26 ENCOUNTER — Telehealth: Payer: Self-pay

## 2022-01-26 NOTE — Progress Notes (Signed)
Discharge Progress Report  Patient Details  Name: George Meyer MRN: [id number] Date of Birth: 07-21-1940 Referring Provider:   Flowsheet Row PULMONARY REHAB OTHER RESP ORIENTATION from 11/13/2021 in [facility]  Referring Provider Dr. Elsworth Soho        Number of Visits: 18  Reason for Discharge:  Early Exit:  Insurance: co pay  Smoking History:  Social History   Tobacco Use  Smoking Status Former   Packs/day: 1.00   Years: 66.00   Total pack years: 66.00   Types: Cigarettes   Quit date: 11/09/2020   Years since quitting: 1.2  Smokeless Tobacco Never  Tobacco Comments   started smoking age 52    Diagnosis:  Centrilobular emphysema ([place])  Chronic respiratory failure with hypoxia and hypercapnia ([place])  ADL UCSD:  Pulmonary Assessment Scores     Row Name 11/13/21 1252         ADL UCSD   ADL Phase Entry     SOB Score total 34     Rest 0     Walk 2     Stairs 4     Bath 1     Dress 0     Shop 2       CAT Score   CAT Score 13       mMRC Score   mMRC Score 3              Initial Exercise Prescription:  Initial Exercise Prescription - 11/13/21 1300       Date of Initial Exercise RX and Referring Provider   Date 11/13/21    Referring Provider Dr. Elsworth Soho    Expected Discharge Date 03/20/22      Oxygen   Oxygen Continuous    Liters 2    Maintain Oxygen Saturation 88% or higher      Treadmill   MPH 1    Grade 0    Minutes 17      NuStep   Level 1    SPM 80    Minutes 22      Prescription Details   Frequency (times per week) 2    Duration Progress to 30 minutes of continuous aerobic without signs/symptoms of physical distress      Intensity   THRR 40-80% of Max Heartrate 55-110    Ratings of Perceived Exertion 11-13    Perceived Dyspnea 0-4      Resistance Training   Training Prescription Yes    Weight 3    Reps 10-15             Discharge Exercise Prescription (Final Exercise Prescription Changes):   Exercise Prescription Changes - 01/16/22 1500       Response to Exercise   Blood Pressure (Admit) 122/72    Blood Pressure (Exercise) 130/70    Blood Pressure (Exit) 130/70    Heart Rate (Admit) 85 bpm    Heart Rate (Exercise) 106 bpm    Heart Rate (Exit) 96 bpm    Oxygen Saturation (Admit) 90 %    Oxygen Saturation (Exercise) 88 %    Oxygen Saturation (Exit) 93 %    Rating of Perceived Exertion (Exercise) 13    Perceived Dyspnea (Exercise) 13    Duration Continue with 30 min of aerobic exercise without signs/symptoms of physical distress.    Intensity THRR unchanged      Progression   Progression Continue to progress workloads to maintain intensity without signs/symptoms of physical distress.  Resistance Training   Training Prescription Yes    Weight 5    Reps 10-15    Time 10 Minutes      Oxygen   Oxygen Continuous    Liters 2      Treadmill   MPH 1.7    Grade 0    Minutes 22    METs 2.3      Recumbant Elliptical   Level 2    RPM 42    Minutes 17    METs 2.7      Oxygen   Maintain Oxygen Saturation 88% or higher             Functional Capacity:  6 Minute Walk     Row Name 11/13/21 1349         6 Minute Walk   Phase Initial     Distance 900 feet     Walk Time 6 minutes     # of Rest Breaks 1     MPH 1.7     METS 2.17     RPE 12     Perceived Dyspnea  11     VO2 Peak 7.58     Symptoms Yes (comment)     Comments 1 minute standing rest due to desaturation and to put on nasal canula     Resting HR 75 bpm     Resting BP 130/62     Resting Oxygen Saturation  91 %     Exercise Oxygen Saturation  during 6 min walk 81 %     Max Ex. HR 95 bpm     Max Ex. BP 150/68     2 Minute Post BP 118/68       Interval HR   1 Minute HR 88     2 Minute HR 91     3 Minute HR 88     4 Minute HR 84     5 Minute HR 91     6 Minute HR 95     2 Minute Post HR 80     Interval Heart Rate? Yes       Interval Oxygen   Interval Oxygen? Yes     Baseline  Oxygen Saturation % 91 %     1 Minute Oxygen Saturation % 90 %     1 Minute Liters of Oxygen 0 L     2 Minute Oxygen Saturation % 92 %     2 Minute Liters of Oxygen 0 L     3 Minute Oxygen Saturation % 81 %     3 Minute Liters of Oxygen 0 L     4 Minute Oxygen Saturation % 88 %     4 Minute Liters of Oxygen 2 L     5 Minute Oxygen Saturation % 92 %     5 Minute Liters of Oxygen 2 L     6 Minute Oxygen Saturation % 92 %     6 Minute Liters of Oxygen 2 L     2 Minute Post Oxygen Saturation % 94 %     2 Minute Post Liters of Oxygen 2 L              Psychological, QOL, Others - Outcomes: PHQ 2/9:    11/13/2021   12:55 PM 04/25/2021    8:42 AM 01/23/2021    9:12 AM 12/09/2020   10:09 AM 09/12/2020    9:38 AM  Depression screen PHQ 2/9  Decreased  Interest 0 1 2 0 0  Down, Depressed, Hopeless 0 1 0 0 0  PHQ - 2 Score 0 2 2 0 0  Altered sleeping 0 0 0  0  Tired, decreased energy '1 1 1  '$ 0  Change in appetite '1 2 1  3  '$ Feeling bad or failure about yourself  0 0 0  0  Trouble concentrating 0 0 0  0  Moving slowly or fidgety/restless 0 0 0  0  Suicidal thoughts 0 0 0  0  PHQ-9 Score '2 5 4  3  '$ Difficult doing work/chores Not difficult at all Not difficult at all Not difficult at all  Not difficult at all    Quality of Life:  Quality of Life - 11/13/21 1356       Quality of Life   Select Quality of Life      Quality of Life Scores   Health/Function Pre 19.96 %    Socioeconomic Pre 25.75 %    Psych/Spiritual Pre 29 %    Family Pre 30 %    GLOBAL Pre 24.57 %             Personal Goals: Goals established at orientation with interventions provided to work toward goal.  Personal Goals and Risk Factors at Admission - 11/13/21 1304       Core Components/Risk Factors/Patient Goals on Admission   Tobacco Cessation Yes    Number of packs per day 1/4 recently, but quit 11/13/2021    Intervention Assist the participant in steps to quit. Provide individualized education and  counseling about committing to Tobacco Cessation, relapse prevention, and pharmacological support that can be provided by physician.;Advice worker, assist with locating and accessing local/national Quit Smoking programs, and support quit date choice.    Expected Outcomes Long Term: Complete abstinence from all tobacco products for at least 12 months from quit date.;Short Term: Will quit all tobacco product use, adhering to prevention of relapse plan.;Short Term: Will demonstrate readiness to quit, by selecting a quit date.    Improve shortness of breath with ADL's Yes    Intervention Provide education, individualized exercise plan and daily activity instruction to help decrease symptoms of SOB with activities of daily living.    Expected Outcomes Short Term: Improve cardiorespiratory fitness to achieve a reduction of symptoms when performing ADLs;Long Term: Be able to perform more ADLs without symptoms or delay the onset of symptoms    Hypertension Yes    Intervention Provide education on lifestyle modifcations including regular physical activity/exercise, weight management, moderate sodium restriction and increased consumption of fresh fruit, vegetables, and low fat dairy, alcohol moderation, and smoking cessation.;Monitor prescription use compliance.    Expected Outcomes Short Term: Continued assessment and intervention until BP is < 140/43m HG in hypertensive participants. < 130/86mHG in hypertensive participants with diabetes, heart failure or chronic kidney disease.;Long Term: Maintenance of blood pressure at goal levels.    Lipids Yes    Intervention Provide education and support for participant on nutrition & aerobic/resistive exercise along with prescribed medications to achieve LDL '70mg'$ , HDL >'40mg'$ .    Expected Outcomes Short Term: Participant states understanding of desired cholesterol values and is compliant with medications prescribed. Participant is following exercise  prescription and nutrition guidelines.;Long Term: Cholesterol controlled with medications as prescribed, with individualized exercise RX and with personalized nutrition plan. Value goals: LDL < '70mg'$ , HDL > 40 mg.              Personal  Goals Discharge:  Goals and Risk Factor Review     Row Name 11/20/21 0733 12/12/21 0748 12/15/21 1037 01/09/22 1443       Core Components/Risk Factors/Patient Goals Review   Personal Goals Review Tobacco Cessation;Weight Management/Obesity;Improve shortness of breath with ADL's;Lipids;Hypertension;Other Tobacco Cessation;Weight Management/Obesity;Improve shortness of breath with ADL's;Lipids;Hypertension;Other Tobacco Cessation;Weight Management/Obesity;Improve shortness of breath with ADL's;Lipids;Hypertension;Other Tobacco Cessation;Improve shortness of breath with ADL's;Hypertension;Lipids    Review Patient was referred to [facility] wtih Centrilobuler emphysema and chronic respiratory failure. He has completed 1 session exercising on 2L with oxygen saturations running 95%. His personal goals for the program are to decrease his SOB with exertion and continue to abstain from smoking. We will continue to monitor his progress as he works towards meeting these goals. Patient was referred to [facility] wtih Centrilobuler emphysema and chronic respiratory failure. He has completed 8 session exercising on 2L with oxygen saturations running 95%. His personal goals for the program are to decrease his SOB with exertion and continue to abstain from smoking. We will continue to monitor his progress as he works towards meeting these goals. Patient has completed  8 session.  He is exercising on 2L with oxygen saturations running 90%-95%. We encourage pursed lipped breathing technique. He tolerated using the nu-step and treadmill without difficulty.  His personal goals for the program are to decrease his SOB with exertion and continue to abstain from smoking. We will continue to monitor his  progress as he works towards meeting these goals. Patient has completed  14 session.  He is exercising on 2L with oxygen saturations running 90%-93%, while exercising.  We encourage pursed lipped breathing technique. He tolerated using the nu-step and treadmill without difficulty.  His personal goals for the program are to decrease his SOB with exertion and continue to abstain from smoking. We will continue to monitor his progress as he works towards meeting these goals.    Expected Outcomes Patient will complete the program meeting both personal and program goals. Patient will complete the program meeting both personal and program goals. Patient will complete the program meeting both personal and program goals. Patient will complete the program meeting both personal and program goals.             Exercise Goals and Review:  Exercise Goals     Row Name 11/13/21 1355 11/22/21 0716 12/19/21 1500 01/17/22 0708       Exercise Goals   Increase Physical Activity Yes Yes Yes Yes    Intervention Provide advice, education, support and counseling about physical activity/exercise needs.;Develop an individualized exercise prescription for aerobic and resistive training based on initial evaluation findings, risk stratification, comorbidities and participant's personal goals. Provide advice, education, support and counseling about physical activity/exercise needs.;Develop an individualized exercise prescription for aerobic and resistive training based on initial evaluation findings, risk stratification, comorbidities and participant's personal goals. Provide advice, education, support and counseling about physical activity/exercise needs.;Develop an individualized exercise prescription for aerobic and resistive training based on initial evaluation findings, risk stratification, comorbidities and participant's personal goals. Provide advice, education, support and counseling about physical activity/exercise  needs.;Develop an individualized exercise prescription for aerobic and resistive training based on initial evaluation findings, risk stratification, comorbidities and participant's personal goals.    Expected Outcomes Short Term: Attend rehab on a regular basis to increase amount of physical activity.;Long Term: Add in home exercise to make exercise part of routine and to increase amount of physical activity.;Long Term: Exercising regularly at least 3-5 days a week. Short  Term: Attend rehab on a regular basis to increase amount of physical activity.;Long Term: Add in home exercise to make exercise part of routine and to increase amount of physical activity.;Long Term: Exercising regularly at least 3-5 days a week. Short Term: Attend rehab on a regular basis to increase amount of physical activity.;Long Term: Add in home exercise to make exercise part of routine and to increase amount of physical activity.;Long Term: Exercising regularly at least 3-5 days a week. Short Term: Attend rehab on a regular basis to increase amount of physical activity.;Long Term: Add in home exercise to make exercise part of routine and to increase amount of physical activity.;Long Term: Exercising regularly at least 3-5 days a week.    Increase Strength and Stamina Yes Yes Yes Yes    Intervention Provide advice, education, support and counseling about physical activity/exercise needs.;Develop an individualized exercise prescription for aerobic and resistive training based on initial evaluation findings, risk stratification, comorbidities and participant's personal goals. Provide advice, education, support and counseling about physical activity/exercise needs.;Develop an individualized exercise prescription for aerobic and resistive training based on initial evaluation findings, risk stratification, comorbidities and participant's personal goals. Provide advice, education, support and counseling about physical activity/exercise  needs.;Develop an individualized exercise prescription for aerobic and resistive training based on initial evaluation findings, risk stratification, comorbidities and participant's personal goals. Provide advice, education, support and counseling about physical activity/exercise needs.;Develop an individualized exercise prescription for aerobic and resistive training based on initial evaluation findings, risk stratification, comorbidities and participant's personal goals.    Expected Outcomes Short Term: Increase workloads from initial exercise prescription for resistance, speed, and METs.;Short Term: Perform resistance training exercises routinely during rehab and add in resistance training at home;Long Term: Improve cardiorespiratory fitness, muscular endurance and strength as measured by increased METs and functional capacity (6MWT) Short Term: Increase workloads from initial exercise prescription for resistance, speed, and METs.;Short Term: Perform resistance training exercises routinely during rehab and add in resistance training at home;Long Term: Improve cardiorespiratory fitness, muscular endurance and strength as measured by increased METs and functional capacity (6MWT) Short Term: Increase workloads from initial exercise prescription for resistance, speed, and METs.;Short Term: Perform resistance training exercises routinely during rehab and add in resistance training at home;Long Term: Improve cardiorespiratory fitness, muscular endurance and strength as measured by increased METs and functional capacity (6MWT) Short Term: Increase workloads from initial exercise prescription for resistance, speed, and METs.;Short Term: Perform resistance training exercises routinely during rehab and add in resistance training at home;Long Term: Improve cardiorespiratory fitness, muscular endurance and strength as measured by increased METs and functional capacity (6MWT)    Able to understand and use rate of perceived  exertion (RPE) scale Yes Yes Yes Yes    Intervention Provide education and explanation on how to use RPE scale Provide education and explanation on how to use RPE scale Provide education and explanation on how to use RPE scale Provide education and explanation on how to use RPE scale    Expected Outcomes Short Term: Able to use RPE daily in rehab to express subjective intensity level;Long Term:  Able to use RPE to guide intensity level when exercising independently Short Term: Able to use RPE daily in rehab to express subjective intensity level;Long Term:  Able to use RPE to guide intensity level when exercising independently Short Term: Able to use RPE daily in rehab to express subjective intensity level;Long Term:  Able to use RPE to guide intensity level when exercising independently Short Term: Able to  use RPE daily in rehab to express subjective intensity level;Long Term:  Able to use RPE to guide intensity level when exercising independently    Able to understand and use Dyspnea scale Yes Yes Yes Yes    Intervention Provide education and explanation on how to use Dyspnea scale Provide education and explanation on how to use Dyspnea scale Provide education and explanation on how to use Dyspnea scale Provide education and explanation on how to use Dyspnea scale    Expected Outcomes Short Term: Able to use Dyspnea scale daily in rehab to express subjective sense of shortness of breath during exertion;Long Term: Able to use Dyspnea scale to guide intensity level when exercising independently Short Term: Able to use Dyspnea scale daily in rehab to express subjective sense of shortness of breath during exertion;Long Term: Able to use Dyspnea scale to guide intensity level when exercising independently Short Term: Able to use Dyspnea scale daily in rehab to express subjective sense of shortness of breath during exertion;Long Term: Able to use Dyspnea scale to guide intensity level when exercising independently  Short Term: Able to use Dyspnea scale daily in rehab to express subjective sense of shortness of breath during exertion;Long Term: Able to use Dyspnea scale to guide intensity level when exercising independently    Knowledge and understanding of Target Heart Rate Range (THRR) Yes Yes Yes Yes    Intervention Provide education and explanation of THRR including how the numbers were predicted and where they are located for reference Provide education and explanation of THRR including how the numbers were predicted and where they are located for reference Provide education and explanation of THRR including how the numbers were predicted and where they are located for reference Provide education and explanation of THRR including how the numbers were predicted and where they are located for reference    Expected Outcomes Short Term: Able to state/look up THRR;Long Term: Able to use THRR to govern intensity when exercising independently;Short Term: Able to use daily as guideline for intensity in rehab Short Term: Able to state/look up THRR;Long Term: Able to use THRR to govern intensity when exercising independently;Short Term: Able to use daily as guideline for intensity in rehab Short Term: Able to state/look up THRR;Long Term: Able to use THRR to govern intensity when exercising independently;Short Term: Able to use daily as guideline for intensity in rehab Short Term: Able to state/look up THRR;Long Term: Able to use THRR to govern intensity when exercising independently;Short Term: Able to use daily as guideline for intensity in rehab    Understanding of Exercise Prescription Yes Yes Yes Yes    Intervention Provide education, explanation, and written materials on patient's individual exercise prescription Provide education, explanation, and written materials on patient's individual exercise prescription Provide education, explanation, and written materials on patient's individual exercise prescription Provide  education, explanation, and written materials on patient's individual exercise prescription    Expected Outcomes Long Term: Able to explain home exercise prescription to exercise independently;Short Term: Able to explain program exercise prescription Long Term: Able to explain home exercise prescription to exercise independently;Short Term: Able to explain program exercise prescription Long Term: Able to explain home exercise prescription to exercise independently;Short Term: Able to explain program exercise prescription Long Term: Able to explain home exercise prescription to exercise independently;Short Term: Able to explain program exercise prescription             Exercise Goals Re-Evaluation:  Exercise Goals Re-Evaluation     [place] Name 11/22/21 [id number]  12/19/21 1500 01/17/22 0708         Exercise Goal Re-Evaluation   Exercise Goals Review Increase Physical Activity;Able to understand and use Dyspnea scale;Understanding of Exercise Prescription;Knowledge and understanding of Target Heart Rate Range (THRR);Increase Strength and Stamina;Able to understand and use rate of perceived exertion (RPE) scale Increase Physical Activity;Increase Strength and Stamina;Able to understand and use rate of perceived exertion (RPE) scale;Able to understand and use Dyspnea scale;Knowledge and understanding of Target Heart Rate Range (THRR);Understanding of Exercise Prescription Increase Physical Activity;Increase Strength and Stamina;Able to understand and use rate of perceived exertion (RPE) scale;Able to understand and use Dyspnea scale;Knowledge and understanding of Target Heart Rate Range (THRR);Understanding of Exercise Prescription     Comments Pt has completed 2 exercise sessions of PR. He is motivated to improve himself. He has been 1 week cigarette free and reports that he is feeling much better due to this. He is currently exercising at 1.9 METs on the stepper. Will continue to monitor and progress as able.  Pt has completed 10 sessions of PR. He continues to be motivated when in class and is pushing himself. He is currently exercising at 2.23 METs on the treadmill. Will monitor and progress as able. Pt has completed 16 exercise sessions of pulmonary rehab. He continues to be cigarette free. He continues to be motivated and to push himself in class. He is now doing some exercise at home, even though he does not wear his oxygen at home. He is currently exercising at 2.3 METs on the treadmill. Will continue to monitor and progress as able.     Expected Outcomes Through exercise at rehab and at home, the patient will meet their stated goals. Through exercise at rehab and at home, the patient will meet their stated goals. Through exercise at rehab and at home, the patient will meet their stated goals.              Nutrition & Weight - Outcomes:  Pre Biometrics - 11/13/21 1355       Pre Biometrics   Height '5\' 8"'$  (1.727 m)    Weight 124 lb 1.9 oz (56.3 kg)    Waist Circumference 35.5 inches    Hip Circumference 36 inches    Waist to Hip Ratio 0.99 %    BMI (Calculated) 18.88    Triceps Skinfold 16 mm    % Body Fat 23.1 %    Grip Strength 27.6 kg    Flexibility 0 in    Single Leg Stand 8 seconds              Nutrition:  Nutrition Therapy & Goals - 01/09/22 1437       Personal Nutrition Goals   Comments Handout provided and discussed regarding healthier choices.  We provide 2 educational sessions on heart healthy nutrition with handouts. We also provide assistance with RD referal if patient is interested.      Intervention Plan   Intervention Nutrition handout(s) given to patient.    Expected Outcomes Short Term Goal: Understand basic principles of dietary content, such as calories, fat, sodium, cholesterol and nutrients.             Nutrition Discharge:  Nutrition Assessments - 11/13/21 1300       MEDFICTS Scores   Pre Score 36             Education Questionnaire  Score:  Knowledge Questionnaire Score - 11/13/21 1302       Knowledge Questionnaire Score  Pre Score 9/18            Pt called on 01/25/2022 to drop from PR. He had attended 18 sessions. Pt states that he is no longer able to continue due to his co pay.

## 2022-01-26 NOTE — Telephone Encounter (Signed)
Left message requesting call back reference PREP referral

## 2022-01-30 ENCOUNTER — Encounter (HOSPITAL_COMMUNITY): Payer: PPO

## 2022-02-01 ENCOUNTER — Encounter (HOSPITAL_COMMUNITY): Payer: PPO

## 2022-02-05 ENCOUNTER — Telehealth: Payer: Self-pay | Admitting: *Deleted

## 2022-02-05 NOTE — Telephone Encounter (Signed)
Called to offer August PREP class at [facility]. Accepted Tues/Thurs. [id number] beginning 02/20/2022. Intake assessment scheduled 02/13/2022 at 1400.

## 2022-02-06 ENCOUNTER — Encounter (HOSPITAL_COMMUNITY): Payer: PPO

## 2022-02-08 ENCOUNTER — Encounter (HOSPITAL_COMMUNITY): Payer: PPO

## 2022-02-08 ENCOUNTER — Ambulatory Visit: Payer: Self-pay | Admitting: Family Medicine

## 2022-02-13 ENCOUNTER — Encounter: Payer: Self-pay | Admitting: *Deleted

## 2022-02-13 ENCOUNTER — Encounter (HOSPITAL_COMMUNITY): Payer: PPO

## 2022-02-13 ENCOUNTER — Ambulatory Visit (INDEPENDENT_AMBULATORY_CARE_PROVIDER_SITE_OTHER): Payer: PPO | Admitting: Family Medicine

## 2022-02-13 VITALS — BP 114/70 | Ht 68.0 in | Wt 125.2 lb

## 2022-02-13 DIAGNOSIS — R7303 Prediabetes: Secondary | ICD-10-CM | POA: Diagnosis not present

## 2022-02-13 DIAGNOSIS — E876 Hypokalemia: Secondary | ICD-10-CM

## 2022-02-13 DIAGNOSIS — E782 Mixed hyperlipidemia: Secondary | ICD-10-CM

## 2022-02-13 DIAGNOSIS — I10 Essential (primary) hypertension: Secondary | ICD-10-CM | POA: Diagnosis not present

## 2022-02-13 MED ORDER — PREDNISONE 20 MG PO TABS
ORAL_TABLET | ORAL | 0 refills | Status: DC
Start: 2022-02-13 — End: 2022-03-22

## 2022-02-13 MED ORDER — ALPRAZOLAM 0.5 MG PO TABS: ORAL_TABLET | ORAL | 5 refills | Status: AC

## 2022-02-13 MED ORDER — BUDESONIDE-FORMOTEROL FUMARATE 160-4.5 MCG/ACT IN AERO: INHALATION_SPRAY | RESPIRATORY_TRACT | 6 refills | Status: AC

## 2022-02-13 MED ORDER — MIRTAZAPINE 30 MG PO TBDP: 30.0000 mg | ORAL_TABLET | Freq: Every day | ORAL | 5 refills | Status: AC

## 2022-02-13 MED ORDER — ALBUTEROL SULFATE HFA 108 (90 BASE) MCG/ACT IN AERS: 2.0000 | INHALATION_SPRAY | RESPIRATORY_TRACT | 3 refills | Status: AC | PRN

## 2022-02-13 MED ORDER — PRAVASTATIN SODIUM 40 MG PO TABS: 40.0000 mg | ORAL_TABLET | Freq: Every day | ORAL | 1 refills | Status: AC

## 2022-02-13 NOTE — Progress Notes (Signed)
   Subjective:    Patient ID: George Meyer, male    DOB: 1940/10/03, 81 y.o.   MRN: [id number]  Hypertension This is a chronic problem. The current episode started more than 1 year ago. Risk factors for coronary artery disease include dyslipidemia.   Patient states he needs refill of Xanax and prednisone(to keep on hand for flare of COPD) Patient denies any excessive stress currently Does uses Xanax intermittently to help with sleep and anxiety Does struggle with depression states seemingly doing fairly well with but the mirtazapine helps but he states his appetite is low he thinks it is because of depression but he would like to try bumping up the dose of mirtazapine.  Review of Systems     Objective:   Physical Exam  General-in no acute distress Eyes-no discharge Lungs-respiratory rate normal, CTA CV-no murmurs,RRR Extremities skin warm dry no edema Neuro grossly normal Behavior normal, alert       Assessment & Plan:  1. Primary hypertension Blood pressure good control continue current measures watch diet closely  2. Hypokalemia Previously potassium was low.  We will check lab work.  Patient will do this by fall time  3. Prediabetes Minimize starches in diet healthy diet recommended  4. Mixed hyperlipidemia Continue pravastatin watch diet  Depression with weight loss-mirtazapine bump up the dose to 30 mg follow-up within 3 months follow-up sooner problems  May use Xanax intermittently for anxiety  COPD stable important to do continued inhalers

## 2022-02-13 NOTE — Progress Notes (Signed)
[facility] PREP Evaluation  Patient Details  Name: George Meyer MRN: [id number] Date of Birth: [birth year] Age: 81 y.o. PCP: Kathyrn Drown, MD  Vitals:   02/13/22 1420  BP: 100/68  Pulse: 90  SpO2: (!) 87%  Weight: 125 lb 3.2 oz (56.8 kg)  Height: '5\' 8"'$  (1.727 m)     [facility] Eval - 02/13/22 1600       [facility] "PREP" Location   [facility] "PREP" Location [facility]      Referral    Referring Provider [place]    Reason for referral Current Smoker;Inactivity   COPD   Program Start Date 02/20/22      Measurement   Waist Circumference 34 inches    Hip Circumference 35.6 inches      Information for Trainer   Goals Establish exercise routine, gain weight 5-8lbs, strength training, increase respiratory endurance    Current Exercise none    Orthopedic Concerns none   self reported   Current Barriers none    Restrictions/Precautions Other   decreased respiratory capacity     Mobility and Daily Activities   I find it easy to walk up or down two or more flights of stairs. 3    I have no trouble taking out the trash. 4    I do housework such as vacuuming and dusting on my own without difficulty. 4    I can easily lift a gallon of milk (8lbs). 4    I can easily walk a mile. 1    I have no trouble reaching into high cupboards or reaching down to pick up something from the floor. 4    I do not have trouble doing out-door work such as Armed forces logistics/support/administrative officer, raking leaves, or gardening. 1      Mobility and Daily Activities   I feel younger than my age. 3    I feel independent. 4    I feel energetic. 2    I live an active life.  1    I feel strong. 1    I feel healthy. 4    I feel active as other people my age. 3      How fit and strong are you.   Fit and Strong Total Score 39            Past Medical History:  Diagnosis Date   Anxiety    Cataracts, bilateral    removed by surgery   COPD (chronic obstructive pulmonary disease) (HCC)    Depression, major, single episode,  moderate ([place]) 12/18/2018   Glaucoma    both eyes   Glaucoma    bilateral   H. pylori infection 09/17/2019   S/p treatment with Prevpac. Documented eradiation via breath test on 11/03/19.    Hyperlipidemia    Hypertension    Pre-diabetes    diet controlled   Skin cancer, basal cell    arms, face   Wears dentures    full   Past Surgical History:  Procedure Laterality Date   BIOPSY  09/17/2019   Procedure: BIOPSY;  Surgeon: Daneil Dolin, MD;  [facility];  Service: Endoscopy;;  gastric   CARDIAC CATHETERIZATION N/A 12/2005   negative   COLONOSCOPY N/A 12/2009   small hyperplastic polyp repeat in 10 years   COLONOSCOPY WITH PROPOFOL N/A 12/13/2020   Procedure: COLONOSCOPY WITH PROPOFOL;  Surgeon: Eloise Harman, DO;  [facility];  Service: Endoscopy;  Laterality: N/A;  2:45pm  DUPUYTREN CONTRACTURE RELEASE Left 02/02/2019   Procedure: EXCISION LEFT HAND AND SMALL FINGER DUPUYTREN CONTRACTURE RELEASE;  Surgeon: Marybelle Killings, MD;  Location: [place];  Service: Orthopedics;  Laterality: Left;   ESOPHAGOGASTRODUODENOSCOPY (EGD) WITH PROPOFOL N/A 09/17/2019   Procedure: ESOPHAGOGASTRODUODENOSCOPY (EGD) WITH PROPOFOL;  Surgeon: Daneil Dolin, MD; esophageal labs/Schatzki's ring noncritical, not manipulated.  Small hiatal hernia.  Inflamed stomach with biopsies revealing chronic active gastritis with H. pylori.  H. pylori treated with Prevpac.   EYE SURGERY Bilateral    remove cataracts   HAND SURGERY Right 2010   dupuytrens excision right little finger   POLYPECTOMY  12/13/2020   Procedure: POLYPECTOMY;  Surgeon: Eloise Harman, DO;  [facility];  Service: Endoscopy;;   TONSILLECTOMY     WISDOM TOOTH EXTRACTION     Social History   Tobacco Use  Smoking Status Former   Packs/day: 1.00   Years: 66.00   Total pack years: 66.00   Types: Cigarettes   Quit date: 11/09/2020   Years since quitting: 1.2  Smokeless Tobacco Never  Tobacco Comments    started smoking age 45    [place]ris [place] 02/13/2022, 4:17 PM  To start PREP Class at [facility] on 02/20/2022 Tuesday/Thursday [id number].

## 2022-02-13 NOTE — Patient Instructions (Signed)
Please do your lab work in September  Senior dose please shot in September or early October  We will see you in November or sooner if any problems  Always a pleasure-TakeCare-Dr. Nicki Reaper

## 2022-02-15 ENCOUNTER — Encounter (HOSPITAL_COMMUNITY): Payer: PPO

## 2022-02-20 ENCOUNTER — Encounter: Payer: Self-pay | Admitting: *Deleted

## 2022-02-20 ENCOUNTER — Encounter (HOSPITAL_COMMUNITY): Payer: PPO

## 2022-02-20 NOTE — Progress Notes (Signed)
[facility] PREP Weekly Session  Patient Details  Name: George Meyer MRN: [id number] Date of Birth: 1941/05/17 Age: 81 y.o. PCP: Kathyrn Drown, MD  There were no vitals filed for this visit.   [facility] Weekly seesion - 02/20/22 1600       [facility] "PREP" Location   [facility] "PREP" Location [facility]      Weekly Session   Topic Discussed Goal setting and welcome to the program   Introductions, review workbook, tour of facility. Sitting no longer than 30 minutes   Classes attended to date [place], [place] 02/20/2022, 4:46 PM

## 2022-02-22 ENCOUNTER — Encounter (HOSPITAL_COMMUNITY): Payer: PPO

## 2022-02-26 ENCOUNTER — Telehealth: Payer: Self-pay | Admitting: Pulmonary Disease

## 2022-02-26 NOTE — Telephone Encounter (Signed)
Called and spoke with patient and let him know of Dr. Angus Palms response. He voiced understanding. He states that he is okay with waiting for next available appt in [place] office in October and agreed to call our office if his breathing worsens in the meantime for a sooner appt/ to be added on.  Called and notified Tonya of response and she voiced understanding. She will check with pt about joining appt by phone. Nothing further needed

## 2022-02-26 NOTE — Telephone Encounter (Signed)
Called and spoke to patients daughter [name]. She states that she is concerned because patient spoke to her earlier in the day and his  O2 sats were at 75% on 2LO2 cont. And his breathing has been worse for weeks. She is concerned that patient is not being compliant with checking his O2 sats and taking his medications. She states she believes his condition may be worsening and she may need to move him down to [place] with her for more care. She states this may need to be talked about with Dr. Elsworth Soho at next ov since she believes patient will listen to his input.   Called and spoke to patient and he states that over the last two days his SOB has worsened. He states it has improved today from yesterday and that yesterday he felt he was "gasping" for air all day. He denies cough, fever, wheezing. He confirms he is using his nebulizer medications 4 times in a 24 hour period. Patient checked O2 sats while on the phone with me and his O2 sat on 2LO2 cont was 98% and heart rate was 76. I asked patient if he would like a message sent to Dr. Elsworth Soho to let him know what is going on with his breathing and patient agreed. Dr. Elsworth Soho please advise, is there anything you recommend for patient to help with his breathing?

## 2022-02-27 ENCOUNTER — Other Ambulatory Visit: Payer: Self-pay | Admitting: *Deleted

## 2022-02-27 ENCOUNTER — Encounter (HOSPITAL_COMMUNITY): Payer: PPO

## 2022-02-27 ENCOUNTER — Telehealth: Payer: Self-pay | Admitting: Family Medicine

## 2022-02-27 MED ORDER — IPRATROPIUM-ALBUTEROL 0.5-2.5 (3) MG/3ML IN SOLN: RESPIRATORY_TRACT | 6 refills | Status: AC

## 2022-02-27 NOTE — Telephone Encounter (Signed)
Pt daughter [name] calling to see what provider recommends due to daughter having concerns about pt breathing and episodes of breathing issues. Daughter is also stating he has had some memory loss. Pt doesn't want to bother daughter with any of this and daughter is having concerns of leaving him home alone. Daughter also wants to know if pt is telling provider everything about how is doing when he comes in for visits.  Pt daughter is on DPR. Daughter is wanting to know can she do a phone visit or what does PCP recommend. Please advise. Thank you.  [phone]

## 2022-02-27 NOTE — Telephone Encounter (Signed)
Nurses-I am willing to check with the daughter it would be preferable to have this as a scheduled phone visit back at the end of Thursday (410 or 430 would be permissible )or next Tuesday or Wednesday Tomorrow schedule is completely booked up  If none of this works with her schedule please find out what is work with your schedule we will try to make it work

## 2022-02-28 NOTE — Telephone Encounter (Signed)
Message left for a return call

## 2022-03-01 ENCOUNTER — Telehealth: Payer: Self-pay | Admitting: Family Medicine

## 2022-03-01 ENCOUNTER — Encounter (HOSPITAL_COMMUNITY): Payer: PPO

## 2022-03-01 ENCOUNTER — Ambulatory Visit (INDEPENDENT_AMBULATORY_CARE_PROVIDER_SITE_OTHER): Payer: PPO | Admitting: Family Medicine

## 2022-03-01 DIAGNOSIS — J449 Chronic obstructive pulmonary disease, unspecified: Secondary | ICD-10-CM

## 2022-03-01 NOTE — Progress Notes (Signed)
   Subjective:    Patient ID: George Meyer, male    DOB: 01/25/1941, 81 y.o.   MRN: [id number]  HPI This was a phone call conversation with the daughter The daughter is concerned that her dad is not telling her everything that is going on with him She would like for him to come down to [place] to live with her    Virtual Visit via Telephone Note  I connected with George Meyer on 03/01/22 at  4:10 PM EDT by telephone and verified that I am speaking with the correct person using two identifiers.  Location: Patient: home Provider: office   I discussed the limitations, risks, security and privacy concerns of performing an evaluation and management service by telephone and the availability of in person appointments. I also discussed with the patient that there may be a patient responsible charge related to this service. The patient expressed understanding and agreed to proceed.   History of Present Illness:    Observations/Objective:   Assessment and Plan:   Follow Up Instructions:    I discussed the assessment and treatment plan with the patient. The patient was provided an opportunity to ask questions and all were answered. The patient agreed with the plan and demonstrated an understanding of the instructions.   The patient was advised to call back or seek an in-person evaluation if the symptoms worsen or if the condition fails to improve as anticipated.  I provided  15 minutes of non-face-to-face time during this encounter.       Review of Systems     Objective:   Physical Exam  This will be a no charge visit because this was a discussion with the family member and not with the actual patient we will be bringing the patient in for a formal office visit in the near future      Assessment & Plan:  Family concerned about his COPD that at times his breathing is really bad and gets short of breath oxygen drops Daughter is very concerned about his memory as  well She would like for him to come to live in [place] She is concerned that he is not taking good care of himself She will discuss this further with him set him up for an office visit she will do a phone conference with the office visit

## 2022-03-01 NOTE — Telephone Encounter (Signed)
Patient wanting to know if he was suppose to have more labs done. He stated was discussed at last visit. Please advise

## 2022-03-01 NOTE — Telephone Encounter (Signed)
Patient needs metabolic 7, magnesium due to hyperkalemia

## 2022-03-02 ENCOUNTER — Other Ambulatory Visit: Payer: Self-pay

## 2022-03-02 DIAGNOSIS — E875 Hyperkalemia: Secondary | ICD-10-CM

## 2022-03-02 NOTE — Telephone Encounter (Signed)
Left message for patient to return the call for additional details of lab orders.

## 2022-03-02 NOTE — Telephone Encounter (Signed)
Patient notified and stated he would go Tuesday to have labs drawn

## 2022-03-06 ENCOUNTER — Encounter: Payer: Self-pay | Admitting: *Deleted

## 2022-03-06 ENCOUNTER — Encounter (HOSPITAL_COMMUNITY): Payer: PPO

## 2022-03-06 NOTE — Progress Notes (Signed)
[facility] PREP Weekly Session  Patient Details  Name: George Meyer MRN: [id number] Date of Birth: 07/11/1940 Age: 81 y.o. PCP: Kathyrn Drown, MD  Vitals:   03/06/22 1632  Weight: 120 lb 12.8 oz (54.8 kg)     [facility] Weekly seesion - 03/06/22 1600       [facility] "PREP" Location   [facility] "PREP" Location [facility]      Weekly Session   Topic Discussed Healthy eating tips   Sodium intake 1500-2300 mg/day, added sugars 24 gm/day for women 36gm/day for men, YUKA app.   Minutes exercised this week 50 minutes    Classes attended to date [place], [place] 03/06/2022, 4:34 PM

## 2022-03-07 ENCOUNTER — Telehealth: Payer: Self-pay | Admitting: *Deleted

## 2022-03-07 ENCOUNTER — Telehealth: Payer: Self-pay | Admitting: Pulmonary Disease

## 2022-03-07 DIAGNOSIS — J9611 Chronic respiratory failure with hypoxia: Secondary | ICD-10-CM

## 2022-03-07 NOTE — Telephone Encounter (Signed)
Called patient to discuss weight loss of 4.4 lbs over last 3 weeks. Stated he had not been feeling well last week, that his oxygen levels had been low which depressed his appetite. He denies issues with food insecurity and declined offers for [facility] meal resource program at this time. We discussed trying to consume smaller more frequent meals throughout the day. I have asked him to discuss appropriate nutritional supplements with his physician and to let me know if he needs further assistance with resources. Patients weigh once a week in PREP Class. Will continue to monitor.

## 2022-03-07 NOTE — Telephone Encounter (Signed)
Called and spoke to [place] and she states a family friend in [place] said that they may be able to help patient with getting O2 while he is in [place] but she does not know the name of the company. She was asking if [employer] could set up temporary O2 in [place] for a few weeks. Advised her we could try to place an order to see if this is something they can do. Order placed. If [employer] cannot proceed with this, then tonya will call back and let us know what the family friend is going to do to help patient in case they need an order.

## 2022-03-08 ENCOUNTER — Encounter (HOSPITAL_COMMUNITY): Payer: PPO

## 2022-03-08 LAB — BASIC METABOLIC PANEL
BUN/Creatinine Ratio: 17 (ref 10–24)
BUN: 14 mg/dL (ref 8–27)
CO2: 27 mmol/L (ref 20–29)
Calcium: 9.6 mg/dL (ref 8.6–10.2)
Chloride: 97 mmol/L (ref 96–106)
Creatinine, Ser: 0.83 mg/dL (ref 0.76–1.27)
Glucose: 91 mg/dL (ref 70–99)
Potassium: 5.2 mmol/L (ref 3.5–5.2)
Sodium: 139 mmol/L (ref 134–144)
eGFR: 87 mL/min/{1.73_m2} (ref >59)

## 2022-03-08 LAB — MAGNESIUM: Magnesium: 2 mg/dL (ref 1.6–2.3)

## 2022-03-11 ENCOUNTER — Encounter: Payer: Self-pay | Admitting: Family Medicine

## 2022-03-11 NOTE — Progress Notes (Signed)
Please mail to the patient thank you 

## 2022-03-13 ENCOUNTER — Encounter (HOSPITAL_COMMUNITY): Payer: PPO

## 2022-03-15 ENCOUNTER — Encounter (HOSPITAL_COMMUNITY): Payer: PPO

## 2022-03-15 ENCOUNTER — Telehealth: Payer: Self-pay | Admitting: *Deleted

## 2022-03-15 NOTE — Telephone Encounter (Signed)
Patient has not attended the past 3 Prep Classes. He contacted me on 03/08/2022 but I have not heard from him this week and he has not returned several of my calls/voicemails. Patient had been typically very communicative and responsive since the start of the program.

## 2022-03-20 ENCOUNTER — Encounter (HOSPITAL_COMMUNITY): Payer: PPO

## 2022-03-20 ENCOUNTER — Telehealth: Payer: Self-pay | Admitting: *Deleted

## 2022-03-20 NOTE — Telephone Encounter (Signed)
Patient called to say he had been in [place] with his daughter last week and she is wanting him to relocate closer to her now rather than waiting until later this year. He will not be able to complete PREP as a result.

## 2022-03-22 ENCOUNTER — Ambulatory Visit: Payer: PPO | Admitting: Pulmonary Disease

## 2022-03-22 ENCOUNTER — Encounter: Payer: Self-pay | Admitting: Pulmonary Disease

## 2022-03-22 DIAGNOSIS — J9612 Chronic respiratory failure with hypercapnia: Secondary | ICD-10-CM

## 2022-03-22 DIAGNOSIS — J9611 Chronic respiratory failure with hypoxia: Secondary | ICD-10-CM | POA: Diagnosis not present

## 2022-03-22 DIAGNOSIS — J449 Chronic obstructive pulmonary disease, unspecified: Secondary | ICD-10-CM | POA: Diagnosis not present

## 2022-03-22 MED ORDER — TRELEGY ELLIPTA 100-62.5-25 MCG/ACT IN AEPB: 1.0000 | INHALATION_SPRAY | Freq: Every day | RESPIRATORY_TRACT | 0 refills | Status: AC

## 2022-03-22 NOTE — Progress Notes (Signed)
   Subjective:    Patient ID: George Meyer, male    DOB: October 19, 1940, 81 y.o.   MRN: [id number]  HPI  81 yo smoker with COPD on noct O2  Adm 08/2021 for COPD exac He smoked since age 77, more recently was about half pack per day, more than 50 pack years  -unable to afford combined triple therapy in the past , gets meds from [place]  Chief Complaint  Patient presents with   Follow-up    Has had some instances where his oxygen sats have dropped on room air.  Patient is planning to move to [facility] with daughter, offered to have daughter listen in on appt as she requested but patient declined and said he would let her know what is discussed.     02/26/2022 daughter [name] called about dropping O2 sat  He arrives with saturation of 87%.   he has an Inogen POC but does not use it much.  When he finds his oxygen level to be low he gets on his home concentrator for a few hours and that seems to help. He is compliant with his nebulizer and Symbicort gets this from [place].  He is very concerned about cost of medications. Chest x-ray 08/2021 was reviewed which shows hyperinflation with interstitial prominence, no infiltrates. He continues to smoke intermittently he can sometimes go up to a week without smoking and then gets anxious and then smokes a pack. He is planning to go to [place] for his son's wedding in 2 months.  His daughter lives in [place] and he eventually wants to move there.  He inquires about oxygen  Significant tests/ events reviewed HC03   09/23/21   = 40  LDCT chest 12/2018 moderate emphysema, right upper lobe 5 mm nodule stable since 2019   Review of Systems neg for any significant sore throat, dysphagia, itching, sneezing, nasal congestion or excess/ purulent secretions, fever, chills, sweats, unintended wt loss, pleuritic or exertional cp, hempoptysis, orthopnea pnd or change in chronic leg swelling. Also denies presyncope, palpitations, heartburn, abdominal pain, nausea,  vomiting, diarrhea or change in bowel or urinary habits, dysuria,hematuria, rash, arthralgias, visual complaints, headache, numbness weakness or ataxia.     Objective:   Physical Exam  Gen. Pleasant, well-nourished, elderly,in no distress ENT - no thrush, no pallor/icterus,no post nasal drip Neck: No JVD, no thyromegaly, no carotid bruits Lungs: no use of accessory muscles, no dullness to percussion, decreased BL without rales or rhonchi  Cardiovascular: Rhythm regular, heart sounds  normal, no murmurs or gallops, no peripheral edema Musculoskeletal: No deformities, no cyanosis or clubbing        Assessment & Plan:

## 2022-03-22 NOTE — Assessment & Plan Note (Signed)
He qualifies for oxygen, he already has an Inogen POC. I just emphasized him to use it and gave him saturation by meters, he should use it whenever saturation less than 90% and probably every time he goes out and ambulates. I encouraged him to stay active For his  trip to [place], he can contact his DME and they should hopefully be able to set him up

## 2022-03-22 NOTE — Assessment & Plan Note (Signed)
He is concerned about cost of medications and explained to him that triple therapy such as Breztri or Trelegy would be expensive. We will give him a trial of sample of Trelegy and sent in a prescription if this works We discussed COPD action plan and signs and symptoms of COPD exacerbation

## 2022-03-22 NOTE — Addendum Note (Signed)
Addended by: Fritzi Mandes D on: 03/22/2022 09:56 AM   Modules accepted: Orders

## 2022-03-22 NOTE — Patient Instructions (Addendum)
Use Inogen POC when you go out - Goal sat 90% & above  Please ask [employer] -they shold be able to set you up with oxygen in [place]  X Trial of trelegy 100 - once daily INSTEAD of symbicort

## 2022-03-26 ENCOUNTER — Telehealth: Payer: Self-pay | Admitting: Family Medicine

## 2022-03-26 NOTE — Telephone Encounter (Signed)
Received message from friend George Meyer that pt would like Dr.Scott to give him a call. Contacted patient to get more info. Pt states nothing is wrong. Pt is moving to [place] and wanted to thank Dr.Scott for all he has one. Pt would like to speak with Dr.Scott "if Dr.Scott would be so kind to give him a call". Please advise. Thank you [phone]

## 2022-03-28 NOTE — Telephone Encounter (Signed)
Front-please work with George Meyer.  Please give him a call.  He will be going to [place] in November to live there long-term.  I would like to see him in October.  You may give him one of my open slots.

## 2022-04-12 ENCOUNTER — Other Ambulatory Visit: Payer: Self-pay

## 2022-04-12 ENCOUNTER — Emergency Department (HOSPITAL_COMMUNITY): Payer: PPO

## 2022-04-12 ENCOUNTER — Encounter (HOSPITAL_COMMUNITY): Payer: Self-pay

## 2022-04-12 ENCOUNTER — Inpatient Hospital Stay (HOSPITAL_COMMUNITY)
Admission: EM | Admit: 2022-04-12 | Discharge: 2022-05-02 | DRG: 871 | Disposition: E | Payer: PPO | Attending: Internal Medicine | Admitting: Internal Medicine

## 2022-04-12 ENCOUNTER — Ambulatory Visit (INDEPENDENT_AMBULATORY_CARE_PROVIDER_SITE_OTHER): Payer: PPO | Admitting: Family Medicine

## 2022-04-12 ENCOUNTER — Inpatient Hospital Stay (HOSPITAL_COMMUNITY): Payer: PPO

## 2022-04-12 VITALS — BP 103/71 | HR 165 | Ht 68.0 in | Wt 116.0 lb

## 2022-04-12 DIAGNOSIS — C7951 Secondary malignant neoplasm of bone: Secondary | ICD-10-CM | POA: Diagnosis present

## 2022-04-12 DIAGNOSIS — Z1152 Encounter for screening for COVID-19: Secondary | ICD-10-CM

## 2022-04-12 DIAGNOSIS — E872 Acidosis, unspecified: Secondary | ICD-10-CM | POA: Diagnosis present

## 2022-04-12 DIAGNOSIS — Z7982 Long term (current) use of aspirin: Secondary | ICD-10-CM

## 2022-04-12 DIAGNOSIS — J9602 Acute respiratory failure with hypercapnia: Secondary | ICD-10-CM | POA: Diagnosis not present

## 2022-04-12 DIAGNOSIS — R0781 Pleurodynia: Secondary | ICD-10-CM

## 2022-04-12 DIAGNOSIS — R6521 Severe sepsis with septic shock: Secondary | ICD-10-CM | POA: Diagnosis present

## 2022-04-12 DIAGNOSIS — R092 Respiratory arrest: Secondary | ICD-10-CM | POA: Diagnosis not present

## 2022-04-12 DIAGNOSIS — J918 Pleural effusion in other conditions classified elsewhere: Secondary | ICD-10-CM | POA: Diagnosis present

## 2022-04-12 DIAGNOSIS — E782 Mixed hyperlipidemia: Secondary | ICD-10-CM | POA: Diagnosis not present

## 2022-04-12 DIAGNOSIS — Z7951 Long term (current) use of inhaled steroids: Secondary | ICD-10-CM

## 2022-04-12 DIAGNOSIS — F411 Generalized anxiety disorder: Secondary | ICD-10-CM | POA: Diagnosis present

## 2022-04-12 DIAGNOSIS — I471 Supraventricular tachycardia, unspecified: Secondary | ICD-10-CM | POA: Diagnosis present

## 2022-04-12 DIAGNOSIS — E860 Dehydration: Secondary | ICD-10-CM | POA: Diagnosis present

## 2022-04-12 DIAGNOSIS — Z85828 Personal history of other malignant neoplasm of skin: Secondary | ICD-10-CM

## 2022-04-12 DIAGNOSIS — F32A Depression, unspecified: Secondary | ICD-10-CM | POA: Diagnosis present

## 2022-04-12 DIAGNOSIS — E785 Hyperlipidemia, unspecified: Secondary | ICD-10-CM | POA: Diagnosis present

## 2022-04-12 DIAGNOSIS — K219 Gastro-esophageal reflux disease without esophagitis: Secondary | ICD-10-CM | POA: Diagnosis present

## 2022-04-12 DIAGNOSIS — J432 Centrilobular emphysema: Secondary | ICD-10-CM | POA: Diagnosis present

## 2022-04-12 DIAGNOSIS — T380X5A Adverse effect of glucocorticoids and synthetic analogues, initial encounter: Secondary | ICD-10-CM | POA: Diagnosis not present

## 2022-04-12 DIAGNOSIS — F419 Anxiety disorder, unspecified: Secondary | ICD-10-CM | POA: Diagnosis present

## 2022-04-12 DIAGNOSIS — J9622 Acute and chronic respiratory failure with hypercapnia: Secondary | ICD-10-CM | POA: Diagnosis not present

## 2022-04-12 DIAGNOSIS — H409 Unspecified glaucoma: Secondary | ICD-10-CM | POA: Diagnosis not present

## 2022-04-12 DIAGNOSIS — D72829 Elevated white blood cell count, unspecified: Secondary | ICD-10-CM | POA: Diagnosis not present

## 2022-04-12 DIAGNOSIS — E870 Hyperosmolality and hypernatremia: Secondary | ICD-10-CM | POA: Diagnosis present

## 2022-04-12 DIAGNOSIS — Z8619 Personal history of other infectious and parasitic diseases: Secondary | ICD-10-CM

## 2022-04-12 DIAGNOSIS — J9621 Acute and chronic respiratory failure with hypoxia: Secondary | ICD-10-CM | POA: Diagnosis not present

## 2022-04-12 DIAGNOSIS — R319 Hematuria, unspecified: Secondary | ICD-10-CM | POA: Diagnosis not present

## 2022-04-12 DIAGNOSIS — Z87891 Personal history of nicotine dependence: Secondary | ICD-10-CM | POA: Diagnosis not present

## 2022-04-12 DIAGNOSIS — Z801 Family history of malignant neoplasm of trachea, bronchus and lung: Secondary | ICD-10-CM

## 2022-04-12 DIAGNOSIS — R54 Age-related physical debility: Secondary | ICD-10-CM | POA: Diagnosis present

## 2022-04-12 DIAGNOSIS — R251 Tremor, unspecified: Secondary | ICD-10-CM | POA: Diagnosis present

## 2022-04-12 DIAGNOSIS — R079 Chest pain, unspecified: Secondary | ICD-10-CM | POA: Diagnosis not present

## 2022-04-12 DIAGNOSIS — R590 Localized enlarged lymph nodes: Secondary | ICD-10-CM | POA: Diagnosis present

## 2022-04-12 DIAGNOSIS — Z9981 Dependence on supplemental oxygen: Secondary | ICD-10-CM

## 2022-04-12 DIAGNOSIS — A419 Sepsis, unspecified organism: Principal | ICD-10-CM | POA: Diagnosis present

## 2022-04-12 DIAGNOSIS — E43 Unspecified severe protein-calorie malnutrition: Secondary | ICD-10-CM | POA: Diagnosis present

## 2022-04-12 DIAGNOSIS — J449 Chronic obstructive pulmonary disease, unspecified: Secondary | ICD-10-CM

## 2022-04-12 DIAGNOSIS — R7989 Other specified abnormal findings of blood chemistry: Secondary | ICD-10-CM | POA: Diagnosis present

## 2022-04-12 DIAGNOSIS — R490 Dysphonia: Secondary | ICD-10-CM | POA: Diagnosis not present

## 2022-04-12 DIAGNOSIS — Y92239 Unspecified place in hospital as the place of occurrence of the external cause: Secondary | ICD-10-CM | POA: Diagnosis not present

## 2022-04-12 DIAGNOSIS — R001 Bradycardia, unspecified: Secondary | ICD-10-CM | POA: Diagnosis not present

## 2022-04-12 DIAGNOSIS — J9601 Acute respiratory failure with hypoxia: Secondary | ICD-10-CM | POA: Diagnosis not present

## 2022-04-12 DIAGNOSIS — I4891 Unspecified atrial fibrillation: Secondary | ICD-10-CM | POA: Diagnosis present

## 2022-04-12 DIAGNOSIS — Z8249 Family history of ischemic heart disease and other diseases of the circulatory system: Secondary | ICD-10-CM | POA: Diagnosis not present

## 2022-04-12 DIAGNOSIS — I10 Essential (primary) hypertension: Secondary | ICD-10-CM | POA: Diagnosis present

## 2022-04-12 DIAGNOSIS — S0081XA Abrasion of other part of head, initial encounter: Secondary | ICD-10-CM | POA: Diagnosis present

## 2022-04-12 DIAGNOSIS — I469 Cardiac arrest, cause unspecified: Secondary | ICD-10-CM | POA: Diagnosis not present

## 2022-04-12 DIAGNOSIS — R7303 Prediabetes: Secondary | ICD-10-CM | POA: Diagnosis present

## 2022-04-12 DIAGNOSIS — I4510 Unspecified right bundle-branch block: Secondary | ICD-10-CM | POA: Diagnosis present

## 2022-04-12 DIAGNOSIS — R918 Other nonspecific abnormal finding of lung field: Secondary | ICD-10-CM | POA: Diagnosis not present

## 2022-04-12 DIAGNOSIS — J189 Pneumonia, unspecified organism: Secondary | ICD-10-CM | POA: Diagnosis present

## 2022-04-12 DIAGNOSIS — Z888 Allergy status to other drugs, medicaments and biological substances status: Secondary | ICD-10-CM

## 2022-04-12 DIAGNOSIS — R55 Syncope and collapse: Secondary | ICD-10-CM | POA: Diagnosis not present

## 2022-04-12 DIAGNOSIS — J440 Chronic obstructive pulmonary disease with acute lower respiratory infection: Secondary | ICD-10-CM | POA: Diagnosis present

## 2022-04-12 DIAGNOSIS — Z6820 Body mass index (BMI) 20.0-20.9, adult: Secondary | ICD-10-CM

## 2022-04-12 DIAGNOSIS — Z9842 Cataract extraction status, left eye: Secondary | ICD-10-CM

## 2022-04-12 DIAGNOSIS — S0083XA Contusion of other part of head, initial encounter: Secondary | ICD-10-CM | POA: Diagnosis present

## 2022-04-12 DIAGNOSIS — Z9841 Cataract extraction status, right eye: Secondary | ICD-10-CM

## 2022-04-12 LAB — CBC WITH DIFFERENTIAL/PLATELET
Abs Immature Granulocytes: 0.12 10*3/uL — ABNORMAL HIGH (ref 0.00–0.07)
Basophils Absolute: 0.1 10*3/uL (ref 0.0–0.1)
Basophils Relative: 1 %
Eosinophils Absolute: 0.3 10*3/uL (ref 0.0–0.5)
Eosinophils Relative: 2 %
HCT: 46.5 % (ref 39.0–52.0)
Hemoglobin: 14.3 g/dL (ref 13.0–17.0)
Immature Granulocytes: 1 %
Lymphocytes Relative: 13 %
Lymphs Abs: 1.9 10*3/uL (ref 0.7–4.0)
MCH: 31.6 pg (ref 26.0–34.0)
MCHC: 30.8 g/dL (ref 30.0–36.0)
MCV: 102.6 fL — ABNORMAL HIGH (ref 80.0–100.0)
Monocytes Absolute: 0.8 10*3/uL (ref 0.1–1.0)
Monocytes Relative: 6 %
Neutro Abs: 10.8 10*3/uL — ABNORMAL HIGH (ref 1.7–7.7)
Neutrophils Relative %: 77 %
Platelets: 380 10*3/uL (ref 150–400)
RBC: 4.53 MIL/uL (ref 4.22–5.81)
RDW: 16 % — ABNORMAL HIGH (ref 11.5–15.5)
WBC: 14 10*3/uL — ABNORMAL HIGH (ref 4.0–10.5)
nRBC: 0 % (ref 0.0–0.2)

## 2022-04-12 LAB — CBC
HCT: 45.8 % (ref 39.0–52.0)
Hemoglobin: 14.3 g/dL (ref 13.0–17.0)
MCH: 32.6 pg (ref 26.0–34.0)
MCHC: 31.2 g/dL (ref 30.0–36.0)
MCV: 104.3 fL — ABNORMAL HIGH (ref 80.0–100.0)
Platelets: 420 10*3/uL — ABNORMAL HIGH (ref 150–400)
RBC: 4.39 MIL/uL (ref 4.22–5.81)
RDW: 16.6 % — ABNORMAL HIGH (ref 11.5–15.5)
WBC: 13.6 10*3/uL — ABNORMAL HIGH (ref 4.0–10.5)
nRBC: 0 % (ref 0.0–0.2)

## 2022-04-12 LAB — URINALYSIS, ROUTINE W REFLEX MICROSCOPIC
Bacteria, UA: NONE SEEN
Bilirubin Urine: NEGATIVE
Glucose, UA: NEGATIVE mg/dL
Ketones, ur: 5 mg/dL — AB
Leukocytes,Ua: NEGATIVE
Nitrite: NEGATIVE
Protein, ur: NEGATIVE mg/dL
Specific Gravity, Urine: >1.046 — ABNORMAL HIGH (ref 1.005–1.030)
pH: 6 (ref 5.0–8.0)

## 2022-04-12 LAB — COMPREHENSIVE METABOLIC PANEL
ALT: 10 U/L (ref 0–44)
AST: 21 U/L (ref 15–41)
Albumin: 3.6 g/dL (ref 3.5–5.0)
Alkaline Phosphatase: 78 U/L (ref 38–126)
Anion gap: 9 (ref 5–15)
BUN: 18 mg/dL (ref 8–23)
CO2: 39 mmol/L — ABNORMAL HIGH (ref 22–32)
Calcium: 9.4 mg/dL (ref 8.9–10.3)
Chloride: 98 mmol/L (ref 98–111)
Creatinine, Ser: 0.63 mg/dL (ref 0.61–1.24)
GFR, Estimated: >60 mL/min (ref >60)
Glucose, Bld: 111 mg/dL — ABNORMAL HIGH (ref 70–99)
Potassium: 4.5 mmol/L (ref 3.5–5.1)
Sodium: 146 mmol/L — ABNORMAL HIGH (ref 135–145)
Total Bilirubin: 0.8 mg/dL (ref 0.3–1.2)
Total Protein: 6.5 g/dL (ref 6.5–8.1)

## 2022-04-12 LAB — D-DIMER, QUANTITATIVE: D-Dimer, Quant: 14.85 ug/mL-FEU — ABNORMAL HIGH (ref 0.00–0.50)

## 2022-04-12 LAB — LACTIC ACID, PLASMA
Lactic Acid, Venous: 2.3 mmol/L (ref 0.5–1.9)
Lactic Acid, Venous: 2.5 mmol/L (ref 0.5–1.9)

## 2022-04-12 LAB — RESP PANEL BY RT-PCR (FLU A&B, COVID) ARPGX2
Influenza A by PCR: NEGATIVE
Influenza B by PCR: NEGATIVE
SARS Coronavirus 2 by RT PCR: NEGATIVE

## 2022-04-12 LAB — TSH: TSH: 0.951 u[IU]/mL (ref 0.350–4.500)

## 2022-04-12 LAB — MAGNESIUM: Magnesium: 2.1 mg/dL (ref 1.7–2.4)

## 2022-04-12 MED ORDER — VITAMIN C 500 MG PO TABS
500.0000 mg | ORAL_TABLET | Freq: Every day | ORAL | Status: DC
  Administered 2022-04-13 – 2022-04-15 (×3): 500 mg via ORAL
  Filled 2022-04-12 (×3): qty 1

## 2022-04-12 MED ORDER — LACTATED RINGERS IV BOLUS
1000.0000 mL | Freq: Once | INTRAVENOUS | Status: AC
  Administered 2022-04-12: 1000 mL via INTRAVENOUS

## 2022-04-12 MED ORDER — DORZOLAMIDE HCL-TIMOLOL MAL 2-0.5 % OP SOLN
1.0000 [drp] | Freq: Two times a day (BID) | OPHTHALMIC | Status: DC
  Administered 2022-04-13 – 2022-04-16 (×8): 1 [drp] via OPHTHALMIC
  Filled 2022-04-12 (×3): qty 10

## 2022-04-12 MED ORDER — ACETAMINOPHEN 650 MG RE SUPP: 650.0000 mg | Freq: Four times a day (QID) | RECTAL | Status: DC | PRN

## 2022-04-12 MED ORDER — ACETAMINOPHEN 325 MG PO TABS
650.0000 mg | ORAL_TABLET | Freq: Four times a day (QID) | ORAL | Status: DC | PRN
  Administered 2022-04-13 – 2022-04-14 (×2): 650 mg via ORAL
  Filled 2022-04-12 (×2): qty 2

## 2022-04-12 MED ORDER — ARFORMOTEROL TARTRATE 15 MCG/2ML IN NEBU
15.0000 ug | INHALATION_SOLUTION | Freq: Two times a day (BID) | RESPIRATORY_TRACT | Status: DC
  Administered 2022-04-12 – 2022-04-17 (×10): 15 ug via RESPIRATORY_TRACT
  Filled 2022-04-12 (×10): qty 2

## 2022-04-12 MED ORDER — SODIUM CHLORIDE 0.9 % IV SOLN
500.0000 mg | Freq: Once | INTRAVENOUS | Status: AC
  Administered 2022-04-12: 500 mg via INTRAVENOUS
  Filled 2022-04-12: qty 5

## 2022-04-12 MED ORDER — GUAIFENESIN ER 600 MG PO TB12
600.0000 mg | ORAL_TABLET | Freq: Two times a day (BID) | ORAL | Status: DC
  Administered 2022-04-12 – 2022-04-13 (×3): 600 mg via ORAL
  Filled 2022-04-12 (×3): qty 1

## 2022-04-12 MED ORDER — IOHEXOL 350 MG/ML SOLN
75.0000 mL | Freq: Once | INTRAVENOUS | Status: AC | PRN
  Administered 2022-04-12: 75 mL via INTRAVENOUS

## 2022-04-12 MED ORDER — ASPIRIN 81 MG PO TBEC
81.0000 mg | DELAYED_RELEASE_TABLET | Freq: Every day | ORAL | Status: DC
  Administered 2022-04-12 – 2022-04-13 (×2): 81 mg via ORAL
  Filled 2022-04-12 (×2): qty 1

## 2022-04-12 MED ORDER — BUDESONIDE 0.5 MG/2ML IN SUSP
0.5000 mg | Freq: Two times a day (BID) | RESPIRATORY_TRACT | Status: DC
  Administered 2022-04-12 – 2022-04-17 (×10): 0.5 mg via RESPIRATORY_TRACT
  Filled 2022-04-12 (×10): qty 2

## 2022-04-12 MED ORDER — SODIUM CHLORIDE 0.9 % IV SOLN
1.0000 g | INTRAVENOUS | Status: DC
  Administered 2022-04-13: 1 g via INTRAVENOUS
  Filled 2022-04-12 (×2): qty 10

## 2022-04-12 MED ORDER — IPRATROPIUM-ALBUTEROL 0.5-2.5 (3) MG/3ML IN SOLN
3.0000 mL | Freq: Four times a day (QID) | RESPIRATORY_TRACT | Status: DC | PRN
  Administered 2022-04-13: 3 mL via RESPIRATORY_TRACT
  Filled 2022-04-12: qty 3

## 2022-04-12 MED ORDER — PRAVASTATIN SODIUM 40 MG PO TABS
40.0000 mg | ORAL_TABLET | Freq: Every day | ORAL | Status: DC
  Administered 2022-04-12 – 2022-04-14 (×3): 40 mg via ORAL
  Filled 2022-04-12 (×3): qty 1

## 2022-04-12 MED ORDER — MIRTAZAPINE 15 MG PO TABS
30.0000 mg | ORAL_TABLET | Freq: Every day | ORAL | Status: DC
  Administered 2022-04-12 – 2022-04-14 (×3): 30 mg via ORAL
  Filled 2022-04-12 (×3): qty 2

## 2022-04-12 MED ORDER — LACTATED RINGERS IV SOLN
INTRAVENOUS | Status: DC
  Administered 2022-04-12: 75 mL/h via INTRAVENOUS

## 2022-04-12 MED ORDER — SODIUM CHLORIDE 0.9 % IV SOLN
1.0000 g | Freq: Once | INTRAVENOUS | Status: AC
  Administered 2022-04-12: 1 g via INTRAVENOUS
  Filled 2022-04-12: qty 10

## 2022-04-12 MED ORDER — ONDANSETRON HCL 4 MG PO TABS: 4.0000 mg | ORAL_TABLET | Freq: Four times a day (QID) | ORAL | Status: DC | PRN

## 2022-04-12 MED ORDER — SODIUM CHLORIDE 0.9 % IV SOLN
500.0000 mg | INTRAVENOUS | Status: DC
  Administered 2022-04-13: 500 mg via INTRAVENOUS
  Filled 2022-04-12: qty 5

## 2022-04-12 MED ORDER — ENOXAPARIN SODIUM 40 MG/0.4ML IJ SOSY
40.0000 mg | PREFILLED_SYRINGE | INTRAMUSCULAR | Status: DC
  Administered 2022-04-12 – 2022-04-13 (×2): 40 mg via SUBCUTANEOUS
  Filled 2022-04-12 (×2): qty 0.4

## 2022-04-12 MED ORDER — ALPRAZOLAM 0.5 MG PO TABS
0.5000 mg | ORAL_TABLET | Freq: Every evening | ORAL | Status: DC | PRN
  Administered 2022-04-12 – 2022-04-13 (×2): 0.5 mg via ORAL
  Filled 2022-04-12 (×2): qty 1

## 2022-04-12 MED ORDER — ONDANSETRON HCL 4 MG/2ML IJ SOLN: 4.0000 mg | Freq: Four times a day (QID) | INTRAMUSCULAR | Status: DC | PRN

## 2022-04-12 NOTE — Assessment & Plan Note (Addendum)
Continue with pravastatin 

## 2022-04-12 NOTE — ED Notes (Signed)
To CT

## 2022-04-12 NOTE — Assessment & Plan Note (Addendum)
-  Affecting both eyes -Continue outpatient follow-up with PCP/ophthalmologist -Continue the use of Cosopt eyedrops.

## 2022-04-12 NOTE — Assessment & Plan Note (Deleted)
-  Patient may criteria for sepsis at time of admission with elevated WBCs, tachycardia and tachypnea; source of infection pneumonia as seen on CT scan and chest x-ray.  No organ dysfunction appreciated. -Sepsis features improving appropriate -No fever, WBC trending down and lactic acid now normalized -Continue to follow culture results -Continue current IV antibiotic therapy -Flutter valve and mucolytic management provided -Continue bronchodilator management. -Continue home oxygen supplementation. -Maintain adequate hydration -Follow clinical response.

## 2022-04-12 NOTE — Assessment & Plan Note (Signed)
Patient currently pain-free but is tachycardic.  EKG was obtained.  Rate 172.  Cannot determine the rhythm.  Right bundle blanch block makes determining the rhythm difficult as well.  Concern for SVT.  I am also concerned about the possibility of pulmonary embolus as I have had a prior presentation similar to this. This is a medical emergency.  He is going directly to the ER for further evaluation and management.

## 2022-04-12 NOTE — Assessment & Plan Note (Addendum)
-  Continue with mirtazapine and alprazolam.

## 2022-04-12 NOTE — Progress Notes (Signed)
Patient arrived to the floor with daughter and other family member with him. Patient is on 4L oxygen. This is normal for him per family. Respirations are labored with movement. Telemetry placed on patient. Daughter stated that patient was supposed to be moving to [place] today but was too sick to go. She is hoping to be able to go tomorrow if possible.

## 2022-04-12 NOTE — Assessment & Plan Note (Addendum)
-  In the setting of multilobar pneumonia -Elevated D-dimer appreciated at time of admission; CT angiogram has ruled out the presence of pulmonary embolism. -Continue as needed analgesia -Continue treatment for pneumonia.

## 2022-04-12 NOTE — Assessment & Plan Note (Addendum)
-  With component of chronic respiratory failure.   -Stable overall -Patient no longer smoking. -While inpatient will use nebulizer equivalent (Pulmicort and Brovana). -Continue as needed use of albuterol. -Continue mucolytic management and the use of flutter valve. -Good saturation on chronic oxygen supplementation.

## 2022-04-12 NOTE — ED Triage Notes (Signed)
Chest pain on right side started last night. CP coming and going. Pt was seen at [facility] and HR was 170's. Oxygen intake same 4L [place].

## 2022-04-12 NOTE — Progress Notes (Signed)
Subjective:  Patient ID: George Meyer, male    DOB: 1941-03-16  Age: 81 y.o. MRN: [id number]  CC: Chief Complaint  Patient presents with   right side chest pain     Started this weekend has worsened last night     HPI:  81 year old male with COPD and chronic respiratory failure presents for evaluation the above.  Patient reports abrupt onset of pain to the right side of the chest below the axilla last night.  States that he had some pain on the left side but his predominant pain is of the right side.  No increasing shortness of breath per his report.  No recent travel.  He was supposed to go to [place] today but this was canceled due to the fact that he developed symptoms and thus had to come in for evaluation.  Patient denies central chest pain.  No other reported symptoms.  Patient states that he is currently pain-free.  Patient Active Problem List   Diagnosis Date Noted   Chest pain 04/02/2022   Chronic respiratory failure with hypoxia and hypercapnia (HCC) 10/04/2021   Underweight 09/29/2020   Centrilobular emphysema ([place]) 04/25/2020   Depression, major, single episode, in partial remission ([place]) 01/22/2020   History of colonic polyps 26/83/4196   History of Helicobacter pylori infection 10/26/2019   Cigarette smoker 10/21/2019   Dupuytrens contracture 02/02/2019   Aortic atherosclerosis ([place]) 12/30/2016   Insomnia 02/16/2016   Hyperlipidemia 04/18/2015   Solitary pulmonary nodule 11/09/2014   HTN (hypertension) 12/17/2013   Generalized anxiety disorder 12/27/2009   IBS 12/27/2009   COPD  Group D symptoms / risk 12/26/2009    Social Hx   Social History   Socioeconomic History   Marital status: Single    Spouse name: Not on file   Number of children: Not on file   Years of education: Not on file   Highest education level: Not on file  Occupational History   Not on file  Tobacco Use   Smoking status: Former    Packs/day: 1.00    Years: 66.00    Total pack  years: 66.00    Types: Cigarettes    Quit date: 11/09/2020    Years since quitting: 1.4   Smokeless tobacco: Never   Tobacco comments:    started smoking age 21  Vaping Use   Vaping Use: Never used  Substance and Sexual Activity   Alcohol use: No   Drug use: No   Sexual activity: Yes    Partners: Female  Other Topics Concern   Not on file  Social History Narrative   Not on file   Social Determinants of Health   Financial Resource Strain: Not on file  Food Insecurity: Not on file  Transportation Needs: Not on file  Physical Activity: Not on file  Stress: Not on file  Social Connections: Not on file    Review of Systems Per HPI  Objective:  BP 103/71   Pulse (!) 165   Ht '5\' 8"'$  (1.727 m)   Wt 116 lb (52.6 kg)   SpO2 92%   BMI 17.64 kg/m      04/13/2022   11:21 AM 03/22/2022    9:23 AM 03/06/2022    4:32 PM  BP/Weight  Systolic BP 222 979   Diastolic BP 71 62   Wt. (Lbs) 116 118.8 120.8  BMI 17.64 kg/m2 18.06 kg/m2 18.37 kg/m2    Physical Exam Constitutional:      Comments: Underweight,  chronically ill-appearing elderly male in no acute distress.  Eyes:     General:        Right eye: No discharge.        Left eye: No discharge.     Conjunctiva/sclera: Conjunctivae normal.  Cardiovascular:     Rate and Rhythm: Regular rhythm.     Comments: Tachycardia to the 160s. Pulmonary:     Comments: Patient's respiratory status is at his baseline.  Decreased air movement throughout. Psychiatric:        Mood and Affect: Mood normal.        Behavior: Behavior normal.     Lab Results  Component Value Date   WBC 8.7 11/09/2021   HGB 14.6 11/09/2021   HCT 43.7 11/09/2021   PLT 436 11/09/2021   GLUCOSE 91 03/07/2022   CHOL 145 08/21/2021   TRIG 119 08/21/2021   HDL 54 08/21/2021   LDLCALC 70 08/21/2021   ALT 8 11/09/2021   AST 16 11/09/2021   NA 139 03/07/2022   K 5.2 03/07/2022   CL 97 03/07/2022   CREATININE 0.83 03/07/2022   BUN 14 03/07/2022   CO2  27 03/07/2022   TSH 2.390 11/07/2020   PSA 0.82 08/03/2013   HGBA1C 6.0 (H) 11/09/2021     Assessment & Plan:   Problem List Items Addressed This Visit       Other   Chest pain - Primary    Patient currently pain-free but is tachycardic.  EKG was obtained.  Rate 172.  Cannot determine the rhythm.  Right bundle blanch block makes determining the rhythm difficult as well.  Concern for SVT.  I am also concerned about the possibility of pulmonary embolus as I have had a prior presentation similar to this. This is a medical emergency.  He is going directly to the ER for further evaluation and management.      Relevant Orders   EKG 12-Lead (Completed)    Lonni Dirden Lacinda Axon DO [place]

## 2022-04-12 NOTE — H&P (Signed)
History and Physical    Patient: George Meyer ZCH:[id number] DOB: [birth year] DOA: 04/01/2022 DOS: the patient was seen and examined on 04/27/2022 PCP: Kathyrn Drown, MD  Patient coming from: Home  Chief Complaint:  Chief Complaint  Patient presents with   Tachycardia   HPI: George Meyer is a 81 y.o. male with medical history significant of anxiety/depression, hypertension, hyperlipidemia, glaucoma, history of COPD, gastroesophageal reflux disease and prior history of H. Pylori; who presented to the hospital secondary to pleuritic chest discomfort, intermittent coughing spells and shortness of breath.  Patient reports no fever; but experiencing chills.  Coughing spells have become productive of yellowish..  No hemoptysis.  Presented to PCP office with above concerns and was referred to the emergency department for further evaluation and management.  Patient denies palpitations, nausea, vomiting, dysuria/hematuria, melena, hematochezia, focal weaknesses or any other complaints.  Work-up in the ED demonstrated elevated D-dimer with subsequent CT scan of the chest ruling out pulmonary embolism and confirmation as seen on chest x-ray 8 of what appears to be multilobar pneumonia.  Patient met sepsis criteria on admission with tachycardia, tachypnea, elevated WBCs and elevated lactic acid.  Cultures taken, IV antibiotics and fluid resuscitation initiated and TR[facility] contacted to place patient in the hospital for further evaluation and management.  Review of Systems: As mentioned in the history of present illness. All other systems reviewed and are negative. Past Medical History:  Diagnosis Date   Anxiety    Cataracts, bilateral    removed by surgery   COPD (chronic obstructive pulmonary disease) (HCC)    Depression, major, single episode, moderate ([place]) 12/18/2018   Glaucoma    both eyes   Glaucoma    bilateral   H. pylori infection 09/17/2019   S/p treatment with Prevpac.  Documented eradiation via breath test on 11/03/19.    Hyperlipidemia    Hypertension    Pre-diabetes    diet controlled   Skin cancer, basal cell    arms, face   Wears dentures    full   Past Surgical History:  Procedure Laterality Date   BIOPSY  09/17/2019   Procedure: BIOPSY;  Surgeon: Daneil Dolin, MD;  [facility];  Service: Endoscopy;;  gastric   CARDIAC CATHETERIZATION N/A 12/2005   negative   COLONOSCOPY N/A 12/2009   small hyperplastic polyp repeat in 10 years   COLONOSCOPY WITH PROPOFOL N/A 12/13/2020   Procedure: COLONOSCOPY WITH PROPOFOL;  Surgeon: Eloise Harman, DO;  [facility];  Service: Endoscopy;  Laterality: N/A;  2:45pm   DUPUYTREN CONTRACTURE RELEASE Left 02/02/2019   Procedure: EXCISION LEFT HAND AND SMALL FINGER DUPUYTREN CONTRACTURE RELEASE;  Surgeon: Marybelle Killings, MD;  Location: [facility];  Service: Orthopedics;  Laterality: Left;   ESOPHAGOGASTRODUODENOSCOPY (EGD) WITH PROPOFOL N/A 09/17/2019   Procedure: ESOPHAGOGASTRODUODENOSCOPY (EGD) WITH PROPOFOL;  Surgeon: Daneil Dolin, MD; esophageal labs/Schatzki's ring noncritical, not manipulated.  Small hiatal hernia.  Inflamed stomach with biopsies revealing chronic active gastritis with H. pylori.  H. pylori treated with Prevpac.   EYE SURGERY Bilateral    remove cataracts   HAND SURGERY Right 2010   dupuytrens excision right little finger   POLYPECTOMY  12/13/2020   Procedure: POLYPECTOMY;  Surgeon: Eloise Harman, DO;  [facility];  Service: Endoscopy;;   TONSILLECTOMY     WISDOM TOOTH EXTRACTION     Social History:  reports that he quit smoking about 17 months ago. His smoking use included  cigarettes. He has a 66.00 pack-year smoking history. He has never used smokeless tobacco. He reports that he does not drink alcohol and does not use drugs.  Allergies  Allergen Reactions   Norvasc [Amlodipine] Itching   Parafon Forte Dsc [Chlorzoxazone] Itching    Family History   Problem Relation Age of Onset   Heart attack Father    Heart attack Mother    Lung cancer Brother    Colon cancer Neg Hx     Prior to Admission medications   Medication Sig Start Date End Date Taking? Authorizing Provider  albuterol (PROVENTIL) (2.5 MG/3ML) 0.083% nebulizer solution Take 2.5 mg by nebulization every 6 (six) hours as needed for wheezing.   Yes [provider]  albuterol (VENTOLIN HFA) 108 (90 Base) MCG/ACT inhaler Inhale 2 puffs into the lungs every 4 (four) hours as needed. 02/13/22  Yes Babs Sciara, MD  ALPRAZolam Prudy Feeler) 0.5 MG tablet TAKE 1 TABLET BY MOUTH EVERY DAY AT BEDTIME AS NEEDED 02/13/22  Yes Luking, Jonna Coup, MD  Ascorbic Acid (VITAMIN C PO) Take 1 tablet by mouth daily.   Yes [provider]  aspirin EC 81 MG tablet Take 81 mg by mouth daily.   Yes [provider]  budesonide-formoterol (SYMBICORT) 160-4.5 MCG/ACT inhaler INHALE TWO PUFFS BY MOUTH TWICE DAILY ** STOP SPIRIVA ** 02/13/22  Yes Luking, Scott A, MD  docusate sodium (COLACE) 100 MG capsule Take 100 mg by mouth 2 (two) times daily as needed for mild constipation.   Yes [provider]  dorzolamide-timolol (COSOPT) 22.3-6.8 MG/ML ophthalmic solution Place 1 drop into both eyes 2 (two) times daily. 06/02/19  Yes Luking, Jonna Coup, MD  Fluticasone-Umeclidin-Vilant (TRELEGY ELLIPTA) 100-62.5-25 MCG/ACT AEPB Inhale 1 puff into the lungs daily. 03/22/22  Yes Oretha Milch, MD  ipratropium-albuterol (DUONEB) 0.5-2.5 (3) MG/3ML SOLN USE 3 ML VIA NEBULIZER TWICE DAILY 02/27/22  Yes Oretha Milch, MD  mirtazapine (REMERON SOL-TAB) 30 MG disintegrating tablet Take 1 tablet (30 mg total) by mouth at bedtime. 02/13/22  Yes Luking, Jonna Coup, MD  Multiple Vitamins-Minerals (IMMUNE SUPPORT PO) Take 1 tablet by mouth daily.   Yes [provider]  OXYGEN Inhale 4 L into the lungs continuous. 4lpm   Yes [provider]  pravastatin (PRAVACHOL) 40 MG tablet Take 1 tablet  (40 mg total) by mouth at bedtime. 02/13/22  Yes Luking, Jonna Coup, MD  Saw Palmetto 450 MG CAPS Take 450 mg by mouth at bedtime.   Yes [provider]    Physical Exam: Vitals:   04/03/2022 1409 04/19/2022 1430 04/19/2022 1500 04/19/2022 1530  BP: 128/78 126/72 122/68 138/70  Pulse: 95 93 94 98  Resp: (!) 21 (!) 21 (!) 29 18  Temp:      TempSrc:      SpO2: 98% 100% 98% 98%   General exam: Alert, awake, oriented x 3; reports being chest pain-free currently; experiencing intermittent coughing spells and feeling short winded.  No nausea or vomiting. Respiratory system: Bilateral rhonchi with mild expiratory wheezing appreciated on examination.  No using accessory muscle.  Good saturation on 4 L nasal cannula supplementation (baseline). Cardiovascular system: Rate controlled, no rubs, no gallops, no JVD. Gastrointestinal system: Abdomen is nondistended, soft and nontender. No organomegaly or masses felt. Normal bowel sounds heard. Central nervous system: Alert and oriented. No focal neurological deficits. Extremities: No cyanosis or clubbing; no edema. Skin: No petechiae. Psychiatry: Judgement and insight appear normal. Mood & affect appropriate.  Data Reviewed: Lactic acid: 2.3 D-dimer: 14.85 Chest x-ray: Demonstrating multifocal pneumonia CBC: WBCs 14.0, hemoglobin 14.3, platelet count 380 K Comprehensive metabolic panel: Sodium 161, potassium 4.5, bicarb 39, BUN 18, creatinine 0.63; normal LFTs. CT scan of the chest with contrast:  1.No evidence of pulmonary embolus. 2. Peribronchovascular consolidation and atelectasis of the left upper and lower lobes with associated interlobular septal thickening, possibly due to pneumonia, although lymphangitic spread of tumor could also have this appearance. 3. Two solid pulmonary nodules of the right upper lobe, both are increased in size when compared with prior exam and highly concerning for primary lung malignancy. 4. Moderate left and  small right pleural effusions. 5. Enlarged mediastinal lymph nodes, concerning for metastatic disease. 6. New sclerotic lesion of T2, concerning for osseous metastatic disease.  Assessment and Plan: * Sepsis due to pneumonia [facility]) - Patient may criteria for sepsis at time of admission with elevated WBCs, tachycardia and tachypnea; source of infection pneumonia as seen on CT scan and chest x-ray.  No organ dysfunction appreciated at this point -Multilobar pneumonia as appreciated on chest x-ray/CT scan -Follow culture results -Starting treatment with the use of Rocephin and Zithromax -Maintain adequate hydration -Provide bronchodilators and resume home maintenance COPD management (no much wheezing appreciated currently close (. -Mucinex, flutter valve and supportive care will be provided -Good saturation on chronic 4 L supplementation; will follow clinical response.  Lactic acidosis - In the setting of sepsis and dehydration -Provide fluid resuscitation -Follow lactic acid trend -Continue treatment for infection.  Glaucoma - Affecting both eyes -Continue outpatient follow-up with PCP/ophthalmologist -Continue the use of Cosopt eyedrops.  GERD (gastroesophageal reflux disease) - Continue PPI.  Pleuritic chest pain - In the setting of multilobar pneumonia -Elevated D-dimer appreciated at time of admission; CT angiogram ruling out the presence of pulmonary embolism -Continue as needed analgesia -Continue treatment for pneumonia.  Hyperlipidemia - Continue statin. -Patient chronically using Pravachol.  HTN (hypertension) - No currently taking any antihypertensive agents prior to admission -Continue heart healthy diet -Stable vital sign -Follow blood pressure trend.  COPD  Group D symptoms / risk - Stable overall -Patient no longer smoking. -While inpatient will use nebulizer equivalent (Pulmicort and Brovana). -Continue as needed use of albuterol.  Generalized anxiety  disorder - With component of depression -Overall stable mood -Continue as needed Xanax -Continue the use of Remeron.    Advance Care Planning:   Code Status: Full Code   Consults: None  Family Communication: No family at bedside.  Severity of Illness: The appropriate patient status for this patient is INPATIENT. Inpatient status is judged to be reasonable and necessary in order to provide the required intensity of service to ensure the patient's safety. The patient's presenting symptoms, physical exam findings, and initial radiographic and laboratory data in the context of their chronic comorbidities is felt to place them at high risk for further clinical deterioration. Furthermore, it is not anticipated that the patient will be medically stable for discharge from the hospital within 2 midnights of admission.   * I certify that at the point of admission it is my clinical judgment that the patient will require inpatient hospital care spanning beyond 2 midnights from the point of admission due to high intensity of service, high risk for further deterioration and high frequency of surveillance required.*  Author: Barton Dubois, MD 04/25/2022 5:02 PM  For on call review [email or web address].

## 2022-04-12 NOTE — Assessment & Plan Note (Addendum)
-  In the setting of sepsis and dehydration -Resolved after receiving fluid resuscitation. -Continue treatment for pneumonia.

## 2022-04-12 NOTE — Assessment & Plan Note (Addendum)
Continue PPI ?

## 2022-04-12 NOTE — ED Notes (Signed)
Pt ambulated to bathroom and called out to say he had had diarrhea. Pt had soiled on himself and on the floor. Pt was cleaned and clothing changed. As pt was ready to leave bathroom, he had another episode. Pt remained in bathroom for an extended time, as he was short of breath during and after event. Oxygen increased to 6L. Pt able to assist changing clothes and ambulated back to room. Pt remained alert throughout events, although still short of breath. Back to room and placed back on CCM and overdue meds given and IVF started

## 2022-04-12 NOTE — Assessment & Plan Note (Addendum)
Patient hypotensive.  On hold all antihypertensive medications.

## 2022-04-12 NOTE — ED Provider Notes (Signed)
[facility] EMERGENCY DEPARTMENT Provider Note   CSN: [id number] Arrival date & time: 04/14/2022  1155     History  Chief Complaint  Patient presents with   Tachycardia    [facility] is a 81 y.o. male.  HPI 81 year old male presents from PCP office with tachycardia.  History is from patient but also from daughter.  Patient has some memory issues according to the daughter.  She has traveled up here to stay with him over the last couple days to try and move him to [place].  Yesterday he developed some transient right-sided chest pain, primarily seemingly when he was coughing.  Happened again last night.  He is felt generally weak and a little lightheaded.  He was found to have a heart rate of about 170 at the PCPs office and was sent here.  No anterior or central chest pain.  No new dyspnea, is chronically on oxygen.  Does not feel any palpitations. Daughter states he's not been eating/drinking well.  Home Medications Prior to Admission medications   Medication Sig Start Date End Date Taking? Authorizing Provider  albuterol (PROVENTIL) (2.5 MG/3ML) 0.083% nebulizer solution Take 2.5 mg by nebulization every 6 (six) hours as needed for wheezing.    [provider]  albuterol (VENTOLIN HFA) 108 (90 Base) MCG/ACT inhaler Inhale 2 puffs into the lungs every 4 (four) hours as needed. 02/13/22   Kathyrn Drown, MD  ALPRAZolam Duanne Moron) 0.5 MG tablet TAKE 1 TABLET BY MOUTH EVERY DAY AT BEDTIME AS NEEDED 02/13/22   Kathyrn Drown, MD  Ascorbic Acid (VITAMIN C PO) Take 1 tablet by mouth daily.    [provider]  aspirin EC 81 MG tablet Take 81 mg by mouth daily.    [provider]  budesonide-formoterol (SYMBICORT) 160-4.5 MCG/ACT inhaler INHALE TWO PUFFS BY MOUTH TWICE DAILY ** STOP SPIRIVA ** 02/13/22   Luking, Shalie Schremp A, MD  docusate sodium (COLACE) 100 MG capsule Take 100 mg by mouth 2 (two) times daily as needed for mild constipation.    [provider]   dorzolamide-timolol (COSOPT) 22.3-6.8 MG/ML ophthalmic solution Place 1 drop into both eyes 2 (two) times daily. 06/02/19   Kathyrn Drown, MD  Fluticasone-Umeclidin-Vilant (TRELEGY ELLIPTA) 100-62.5-25 MCG/ACT AEPB Inhale 1 puff into the lungs daily. 03/22/22   Rigoberto Noel, MD  ipratropium-albuterol (DUONEB) 0.5-2.5 (3) MG/3ML SOLN USE 3 ML VIA NEBULIZER TWICE DAILY 02/27/22   Rigoberto Noel, MD  mirtazapine (REMERON SOL-TAB) 30 MG disintegrating tablet Take 1 tablet (30 mg total) by mouth at bedtime. 02/13/22   Kathyrn Drown, MD  Multiple Vitamins-Minerals (IMMUNE SUPPORT PO) Take 1 tablet by mouth daily.    [provider]  nitroGLYCERIN (NITROSTAT) 0.4 MG SL tablet 1 sl q 5 min prn chest pressure as directed Patient not taking: Reported on 04/16/2022 03/27/16   Kathyrn Drown, MD  OXYGEN Inhale into the lungs. 4lpm    [provider]  pravastatin (PRAVACHOL) 40 MG tablet Take 1 tablet (40 mg total) by mouth at bedtime. 02/13/22   Kathyrn Drown, MD  Saw Palmetto 450 MG CAPS Take 450 mg by mouth in the morning and at bedtime.    [provider]      Allergies    Norvasc [amlodipine] and Parafon forte dsc [chlorzoxazone]    Review of Systems   Review of Systems  Respiratory:  Positive for cough. Negative for shortness of breath.   Cardiovascular:  Positive for chest  pain.  Neurological:  Positive for weakness and light-headedness.    Physical Exam Updated Vital Signs BP 128/78   Pulse 95   Temp 97.7 F (36.5 C) (Oral)   Resp (!) 21   SpO2 98%  Physical Exam Vitals and nursing note reviewed.  Constitutional:      General: He is not in acute distress.    Appearance: He is well-developed. He is not ill-appearing or diaphoretic.  HENT:     Head: Normocephalic and atraumatic.  Cardiovascular:     Rate and Rhythm: Regular rhythm. Tachycardia present.     Heart sounds: Normal heart sounds.     Comments: HR~160 Pulmonary:     Effort: Pulmonary effort  is normal.     Breath sounds: Wheezing (slight, expiratory) present.  Abdominal:     Palpations: Abdomen is soft.     Tenderness: There is no abdominal tenderness.  Musculoskeletal:     Right lower leg: No edema.     Left lower leg: No edema.  Skin:    General: Skin is warm and dry.  Neurological:     Mental Status: He is alert.     ED Results / Procedures / Treatments   Labs (all labs ordered are listed, but only abnormal results are displayed) Labs Reviewed  COMPREHENSIVE METABOLIC PANEL - Abnormal; Notable for the following components:      Result Value   Sodium 146 (*)    CO2 39 (*)    Glucose, Bld 111 (*)    All other components within normal limits  CBC WITH DIFFERENTIAL/PLATELET - Abnormal; Notable for the following components:   WBC 14.0 (*)    MCV 102.6 (*)    RDW 16.0 (*)    Neutro Abs 10.8 (*)    Abs Immature Granulocytes 0.12 (*)    All other components within normal limits  D-DIMER, QUANTITATIVE - Abnormal; Notable for the following components:   D-Dimer, Quant 14.85 (*)    All other components within normal limits  LACTIC ACID, PLASMA - Abnormal; Notable for the following components:   Lactic Acid, Venous 2.3 (*)    All other components within normal limits  RESP PANEL BY RT-PCR (FLU A&B, COVID) ARPGX2  CULTURE, BLOOD (ROUTINE X 2)  CULTURE, BLOOD (ROUTINE X 2)  MAGNESIUM  URINALYSIS, ROUTINE W REFLEX MICROSCOPIC  LACTIC ACID, PLASMA    EKG EKG Interpretation  Date/Time:  Thursday April 12 2022 12:11:52 EDT Ventricular Rate:  170 PR Interval:    QRS Duration: 118 QT Interval:  264 QTC Calculation: 443 R Axis:   238 Text Interpretation: Supraventricular tachycardia with occasional Premature ventricular complexes Right bundle branch block Confirmed by Sherwood Gambler [phone]) on 04/13/2022 12:14:46 PM   EKG Interpretation  Date/Time:  Thursday April 12 2022 12:26:45 EDT Ventricular Rate:  109 PR Interval:  130 QRS Duration: 141 QT  Interval:  379 QTC Calculation: 511 R Axis:   125 Text Interpretation: Sinus tachycardia Multiple premature complexes, vent & supraven Right bundle branch block SVT resolved when compared to earlier in the day Confirmed by Sherwood Gambler [phone]) on 04/14/2022 12:42:57 PM        Radiology CT Angio Chest PE W and/or Wo Contrast  Result Date: 04/13/2022 CLINICAL DATA:  Chest pain; * Tracking Code: BO * EXAM: CT ANGIOGRAPHY CHEST WITH CONTRAST TECHNIQUE: Multidetector CT imaging of the chest was performed using the standard protocol during bolus administration of intravenous contrast. Multiplanar CT image reconstructions and MIPs were obtained to evaluate the  vascular anatomy. RADIATION DOSE REDUCTION: This exam was performed according to the departmental dose-optimization program which includes automated exposure control, adjustment of the mA and/or kV according to patient size and/or use of iterative reconstruction technique. CONTRAST:  6m OMNIPAQUE IOHEXOL 350 MG/ML SOLN COMPARISON:  Chest CT dated December 30, 2018 FINDINGS: Cardiovascular: No evidence of pulmonary embolus. Normal heart size. Trace pericardial effusion. Normal caliber thoracic aorta with moderate atherosclerotic disease. Mediastinum/Nodes: Moderate circumferential esophageal wall thickening, most pronounced in the lower esophagus enlarged subcarinal lymph node measuring 2.3 cm in short axis on series 5, image 157. Enlarged AP window lymph node measuring 1.3 cm in short axis on image 44. Enlarged pre-vascular lymph node measuring 1.3 cm in short axis on image 34. Lungs/Pleura: Central airways are patent. Bilateral bronchial wall thickening. Peribronchovascular consolidations and atelectasis of the left upper and lower lobes with associated interlobular septal thickening. Moderate left and small right pleural effusions. Solid pulmonary nodule of the right upper lobe measuring 1.6 x 1.4 cm on series 6, image 172, previously measured 3 mm.  Spiculated solid pulmonary nodule of the right upper lobe measuring 9 x 7 mm on series 6, image 34, previously measured 4 mm. Upper Abdomen: Simple appearing cyst of the left kidney. Nonobstructing stone of the right kidney. Musculoskeletal: New sclerotic lesion of T2. Stable tiny sclerotic lesion of T3, likely a bone island new mild age indeterminate compression deformity of T4. Unchanged T7 and L1 mild compression deformities. Review of the MIP images confirms the above findings. IMPRESSION: 1. No evidence of pulmonary embolus. 2. Peribronchovascular consolidation and atelectasis of the left upper and lower lobes with associated interlobular septal thickening, possibly due to pneumonia, although lymphangitic spread of tumor could also have this appearance. 3. Two solid pulmonary nodules of the right upper lobe, both are increased in size when compared with prior exam and highly concerning for primary lung malignancy. 4. Moderate left and small right pleural effusions. 5. Enlarged mediastinal lymph nodes, concerning for metastatic disease. 6. New sclerotic lesion of T2, concerning for osseous metastatic disease. 7. Aortic Atherosclerosis (ICD10-I70.0). Electronically Signed   By: LYetta GlassmanM.D.   On: 04/06/2022 15:16   DG Chest 2 View  Result Date: 04/13/2022 CLINICAL DATA:  SVT, chest pain EXAM: CHEST - 2 VIEW COMPARISON:  September 22, 2021. FINDINGS: Interstitial and patchy airspace opacities in the left lung. Small left pleural effusion. No visible pneumothorax. Cardiomediastinal silhouette is within normal limits for technique. Patient is rotated to the left. IMPRESSION: 1. Interstitial and patchy airspace opacities in the left lung, suspicious for pneumonia or asymmetric edema. Followup PA and lateral chest X-ray is recommended in 3-4 weeks following trial of antibiotic therapy to ensure resolution and exclude underlying malignancy. 2. Small left pleural effusion. Electronically Signed   By: FMargaretha SheffieldM.D.   On: 04/30/2022 12:55    Procedures .Critical Care  Performed by: GSherwood Gambler MD Authorized by: GSherwood Gambler MD   Critical care provider statement:    Critical care time (minutes):  35   Critical care time was exclusive of:  Separately billable procedures and treating other patients   Critical care was necessary to treat or prevent imminent or life-threatening deterioration of the following conditions:  Respiratory failure   Critical care was time spent personally by me on the following activities:  Development of treatment plan with patient or surrogate, discussions with consultants, evaluation of patient's response to treatment, examination of patient, ordering and review of laboratory studies, ordering and  review of radiographic studies, ordering and performing treatments and interventions, pulse oximetry, re-evaluation of patient's condition and review of old charts     Medications Ordered in ED Medications  azithromycin (ZITHROMAX) 500 mg in sodium chloride 0.9 % 250 mL IVPB (has no administration in time range)  cefTRIAXone (ROCEPHIN) 1 g in sodium chloride 0.9 % 100 mL IVPB (has no administration in time range)  lactated ringers bolus 1,000 mL (0 mLs Intravenous Stopped 04/13/2022 1335)  cefTRIAXone (ROCEPHIN) 1 g in sodium chloride 0.9 % 100 mL IVPB (0 g Intravenous Stopped 04/14/2022 1437)  iohexol (OMNIPAQUE) 350 MG/ML injection 75 mL (75 mLs Intravenous Contrast Given 04/07/2022 1446)  lactated ringers bolus 1,000 mL (1,000 mLs Intravenous New Bag/Given 04/26/2022 1535)    ED Course/ Medical Decision Making/ A&P                           Medical Decision Making Problems Addressed: Community acquired pneumonia of left lung, unspecified part of lung: acute illness or injury with systemic symptoms that poses a threat to life or bodily functions SVT (supraventricular tachycardia): acute illness or injury that poses a threat to life or bodily functions  Amount  and/or Complexity of Data Reviewed Independent Historian:     Details: daughter External Data Reviewed: notes. Labs: ordered.    Details: Leukocytosis c/w infection. Hypernatremia c/w dehydration Radiology: ordered and independent interpretation performed.    Details: CXR - left sided pneumonia and pleural effusion ECG/medicine tests: independent interpretation performed.    Details: SVT  Risk Prescription drug management. Decision regarding hospitalization.   Patient presents with soft but normal blood pressures in the setting of SVT.  He feels generally weak.  He is also had a cough.  The revert maneuver was applied and he had resolution of his SVT back to a sinus rhythm.  He was given IV fluids.  Work-up shows pneumonia which goes along with his acute on chronic cough.  He is chronically on oxygen.  Started IV antibiotics including Rocephin and azithromycin.  He does appear to have some mild dehydration with a hypernatremia.  He has a leukocytosis as well.  I think you will need admission given his multiple comorbidities such as COPD.  I had originally sent a D-dimer given unclear cause of his symptoms but with the chest x-ray findings I think pneumonia explains the cough.  However his right-sided chest pain is not fully explained though probably from the coughing itself.  Given this after discussion with hospitalist, Dr. Dyann Kief, CT was obtained. Will be admitted to hospitalist service.         Final Clinical Impression(s) / ED Diagnoses Final diagnoses:  SVT (supraventricular tachycardia)  Community acquired pneumonia of left lung, unspecified part of lung    Rx / DC Orders ED Discharge Orders     None         Sherwood Gambler, MD 05/01/2022 1545

## 2022-04-13 ENCOUNTER — Telehealth: Payer: Self-pay

## 2022-04-13 DIAGNOSIS — J449 Chronic obstructive pulmonary disease, unspecified: Secondary | ICD-10-CM | POA: Diagnosis not present

## 2022-04-13 DIAGNOSIS — J189 Pneumonia, unspecified organism: Secondary | ICD-10-CM | POA: Diagnosis not present

## 2022-04-13 DIAGNOSIS — F411 Generalized anxiety disorder: Secondary | ICD-10-CM | POA: Diagnosis not present

## 2022-04-13 DIAGNOSIS — E870 Hyperosmolality and hypernatremia: Secondary | ICD-10-CM | POA: Diagnosis present

## 2022-04-13 DIAGNOSIS — K219 Gastro-esophageal reflux disease without esophagitis: Secondary | ICD-10-CM | POA: Diagnosis not present

## 2022-04-13 LAB — CBC
HCT: 40 % (ref 39.0–52.0)
Hemoglobin: 12.4 g/dL — ABNORMAL LOW (ref 13.0–17.0)
MCH: 31.9 pg (ref 26.0–34.0)
MCHC: 31 g/dL (ref 30.0–36.0)
MCV: 102.8 fL — ABNORMAL HIGH (ref 80.0–100.0)
Platelets: 395 10*3/uL (ref 150–400)
RBC: 3.89 MIL/uL — ABNORMAL LOW (ref 4.22–5.81)
RDW: 16.3 % — ABNORMAL HIGH (ref 11.5–15.5)
WBC: 15.4 10*3/uL — ABNORMAL HIGH (ref 4.0–10.5)
nRBC: 0 % (ref 0.0–0.2)

## 2022-04-13 LAB — BASIC METABOLIC PANEL
Anion gap: 9 (ref 5–15)
BUN: 17 mg/dL (ref 8–23)
CO2: 38 mmol/L — ABNORMAL HIGH (ref 22–32)
Calcium: 9 mg/dL (ref 8.9–10.3)
Chloride: 101 mmol/L (ref 98–111)
Creatinine, Ser: 0.52 mg/dL — ABNORMAL LOW (ref 0.61–1.24)
GFR, Estimated: >60 mL/min (ref >60)
Glucose, Bld: 98 mg/dL (ref 70–99)
Potassium: 3.7 mmol/L (ref 3.5–5.1)
Sodium: 148 mmol/L — ABNORMAL HIGH (ref 135–145)

## 2022-04-13 LAB — MAGNESIUM: Magnesium: 1.8 mg/dL (ref 1.7–2.4)

## 2022-04-13 LAB — LACTIC ACID, PLASMA: Lactic Acid, Venous: 1.6 mmol/L (ref 0.5–1.9)

## 2022-04-13 LAB — PHOSPHORUS: Phosphorus: 3.8 mg/dL (ref 2.5–4.6)

## 2022-04-13 MED ORDER — SODIUM CHLORIDE 0.45 % IV SOLN: INTRAVENOUS | Status: DC

## 2022-04-13 NOTE — Progress Notes (Signed)
Progress Note   Patient: [facility] DOB: 01/09/1941 DOA: 04/03/2022     1 DOS: the patient was seen and examined on 04/13/2022   Brief hospital course: [facility] is a 81 y.o. male with medical history significant of anxiety/depression, hypertension, hyperlipidemia, glaucoma, history of COPD, gastroesophageal reflux disease and prior history of H. Pylori; who presented to the hospital secondary to pleuritic chest discomfort, intermittent coughing spells and shortness of breath.  Patient reports no fever; but experiencing chills.  Coughing spells have become productive of yellowish..  No hemoptysis.  Presented to PCP office with above concerns and was referred to the emergency department for further evaluation and management.   Patient denies palpitations, nausea, vomiting, dysuria/hematuria, melena, hematochezia, focal weaknesses or any other complaints.   Work-up in the ED demonstrated elevated D-dimer with subsequent CT scan of the chest ruling out pulmonary embolism and confirmation as seen on chest x-ray 8 of what appears to be multilobar pneumonia.  Patient met sepsis criteria on admission with tachycardia, tachypnea, elevated WBCs and elevated lactic acid.  Cultures taken, IV antibiotics and fluid resuscitation initiated and TRH contacted to place patient in the hospital for further evaluation and management.  Assessment and Plan: * Sepsis due to pneumonia [facility]) -Patient may criteria for sepsis at time of admission with elevated WBCs, tachycardia and tachypnea; source of infection pneumonia as seen on CT scan and chest x-ray.  No organ dysfunction appreciated. -Sepsis features improving appropriate -No fever, WBC trending down and lactic acid now normalized -Continue to follow culture results -Continue current IV antibiotic therapy -Flutter valve and mucolytic management provided -Continue bronchodilator management. -Continue home oxygen  supplementation. -Maintain adequate hydration -Follow clinical response.  Hypernatremia - In the setting of dehydration -Continue fluid resuscitation -IV fluids adjusted to half-normal saline; patient advised to improve oral intake of free water. -Follow electrolytes trend.  Lactic acidosis -In the setting of sepsis and dehydration -Resolved after receiving fluid resuscitation. -Continue treatment for pneumonia.  Glaucoma -Affecting both eyes -Continue outpatient follow-up with PCP/ophthalmologist -Continue the use of Cosopt eyedrops.  GERD (gastroesophageal reflux disease) -Continue PPI.  Pleuritic chest pain -In the setting of multilobar pneumonia -Elevated D-dimer appreciated at time of admission; CT angiogram has ruled out the presence of pulmonary embolism. -Continue as needed analgesia -Continue treatment for pneumonia.  Hyperlipidemia -Continue statin.   HTN (hypertension) -No currently taking any antihypertensive agents prior to admission. -Continue heart healthy diet -Continue to follow vital signs and blood pressure stability. -Maintain adequate hydration.  COPD  Group D symptoms / risk -With component of chronic respiratory failure.   -Stable overall -Patient no longer smoking. -While inpatient will use nebulizer equivalent (Pulmicort and Brovana). -Continue as needed use of albuterol. -Continue mucolytic management and the use of flutter valve. -Good saturation on chronic oxygen supplementation.  Generalized anxiety disorder -With component of depression -Continue as needed Xanax and Remeron -No suicidal ideation or hallucination.   -Stable mood.   Subjective:  Afebrile.  Reporting generalized weakness, tachypnea, ongoing intermittent productive coughing spells and decreased appetite.  Good oxygen saturation on chronic supplementation (4 L nasal cannula supplementation.  Physical Exam: Vitals:   04/13/22 0408 04/13/22 0500 04/13/22 0937 04/13/22  1317  BP: 139/69   123/65  Pulse: 92   92  Resp: 20   18  Temp: 98 F (36.7 C)   98.1 F (36.7 C)  TempSrc: Oral   Oral  SpO2: 98%  94% 96%  Weight:  54.7 kg  General exam: Alert, awake, oriented x 2; disoriented to year.  Able to speak in full sentences.  Still complaining of intermittent coughing spells and tachypnea was appreciated on examination.  No fever. Respiratory system: Positive rhonchi bilaterally; no using accessory muscle.  Positive tachypnea, no crackles.  Mild end expiratory wheezing appreciated on exam. Cardiovascular system: Rate controlled, no rubs, no gallops, no JVD. Gastrointestinal system: Abdomen is nondistended, soft and nontender. No organomegaly or masses felt. Normal bowel sounds heard. Central nervous system: Alert and oriented. No focal neurological deficits. Extremities: No cyanosis or clubbing. Skin: No petechiae. Psychiatry: Mood & affect appropriate.   Data Reviewed:   Family Communication: Daughter at bedside.  Disposition: Status is: Inpatient Remains inpatient appropriate because: Continue IV antibiotics for sepsis due to pneumonia.   Planned Discharge Destination: Home with plans to move to [place] to be with family.   Author: Barton Dubois, MD 04/13/2022 5:49 PM  For on call review [email or web address].

## 2022-04-13 NOTE — Telephone Encounter (Signed)
Call received from patient's daughter Ms Lenor Coffin on dpr, she states patient is currently in hospital and , she would like your opinion on patient condition and estimate amount of time expected to be in hospital, he was supposed to be moving to [place] with family. Please advise

## 2022-04-13 NOTE — Progress Notes (Signed)
[phone] Patient in restroom, unavailable for nebulizer treatment at this time. RT will return later to administer treatment.

## 2022-04-13 NOTE — Telephone Encounter (Signed)
Pt daughter [name] contacted and verbalized understanding. Pt states depending on how patient is feeling bringing him in to office may not be feasible.

## 2022-04-13 NOTE — Telephone Encounter (Signed)
How long he will be in the hospital we will follow under the preview of the hospitalist.  Most situations like this it is 3 to 5 days then I would highly recommend a follow-up office visit with [place] upon getting out before going to [place]  We will follow along electronically when he is discharged we will make sure that they have a follow-up visit quickly

## 2022-04-13 NOTE — TOC Progression Note (Signed)
  Transition of Care [facility]) Screening Note   Patient Details  Name: George [facility] Date of Birth: 09/12/1940   Transition of Care [facility]) CM/SW Contact:    Boneta Lucks, RN Phone Number: 04/13/2022, 11:42 AM    Transition of Care Department [facility]) has reviewed patient and no TOC needs have been identified at this time. We will continue to monitor patient advancement through interdisciplinary progression rounds. If new patient transition needs arise, please place a TOC consult.     Barriers to Discharge: Continued Medical Work up

## 2022-04-13 NOTE — Assessment & Plan Note (Signed)
Renal function with serum cr at 0,61, K is 4,1 and serum bicarbonate at 40. Mg is 2,1  Patient currently NPO Continue volume resuscitation with isotonic saline.  Follow up renal function and electrolytes in am.

## 2022-04-13 NOTE — Care Management Important Message (Signed)
Important Message  Patient Details  Name: George [facility] MRN: [id number] Date of Birth: 05/20/1941   Medicare Important Message Given:  Yes (copy left in room for daughter Mrs. Judie Grieve)     Tommy Medal 04/13/2022, 4:50 PM

## 2022-04-14 ENCOUNTER — Inpatient Hospital Stay (HOSPITAL_COMMUNITY): Payer: PPO

## 2022-04-14 ENCOUNTER — Inpatient Hospital Stay: Payer: Self-pay

## 2022-04-14 DIAGNOSIS — R918 Other nonspecific abnormal finding of lung field: Secondary | ICD-10-CM | POA: Diagnosis present

## 2022-04-14 DIAGNOSIS — A419 Sepsis, unspecified organism: Secondary | ICD-10-CM | POA: Diagnosis not present

## 2022-04-14 DIAGNOSIS — I4891 Unspecified atrial fibrillation: Secondary | ICD-10-CM | POA: Diagnosis present

## 2022-04-14 DIAGNOSIS — J189 Pneumonia, unspecified organism: Secondary | ICD-10-CM | POA: Diagnosis not present

## 2022-04-14 LAB — CBC
HCT: 42.4 % (ref 39.0–52.0)
Hemoglobin: 12.6 g/dL — ABNORMAL LOW (ref 13.0–17.0)
MCH: 31.3 pg (ref 26.0–34.0)
MCHC: 29.7 g/dL — ABNORMAL LOW (ref 30.0–36.0)
MCV: 105.2 fL — ABNORMAL HIGH (ref 80.0–100.0)
Platelets: 403 10*3/uL — ABNORMAL HIGH (ref 150–400)
RBC: 4.03 MIL/uL — ABNORMAL LOW (ref 4.22–5.81)
RDW: 16 % — ABNORMAL HIGH (ref 11.5–15.5)
WBC: 16.8 10*3/uL — ABNORMAL HIGH (ref 4.0–10.5)
nRBC: 0 % (ref 0.0–0.2)

## 2022-04-14 LAB — MAGNESIUM: Magnesium: 2.1 mg/dL (ref 1.7–2.4)

## 2022-04-14 LAB — BLOOD GAS, VENOUS
Acid-Base Excess: 6.4 mmol/L — ABNORMAL HIGH (ref 0.0–2.0)
Bicarbonate: 41.9 mmol/L — ABNORMAL HIGH (ref 20.0–28.0)
Drawn by: 1517
O2 Saturation: 94.1 %
Patient temperature: 37.2
pCO2, Ven: >123 mmHg (ref 44–60)
pH, Ven: 7.06 — CL (ref 7.25–7.43)
pO2, Ven: 79 mmHg — ABNORMAL HIGH (ref 32–45)

## 2022-04-14 LAB — BLOOD GAS, ARTERIAL
Acid-Base Excess: 16 mmol/L — ABNORMAL HIGH (ref 0.0–2.0)
Bicarbonate: 42.4 mmol/L — ABNORMAL HIGH (ref 20.0–28.0)
Drawn by: 10555
FIO2: 100 %
O2 Saturation: 99.5 %
Patient temperature: 35
pCO2 arterial: 52 mmHg — ABNORMAL HIGH (ref 32–48)
pH, Arterial: 7.51 — ABNORMAL HIGH (ref 7.35–7.45)
pO2, Arterial: 328 mmHg — ABNORMAL HIGH (ref 83–108)

## 2022-04-14 LAB — BASIC METABOLIC PANEL
Anion gap: 4 — ABNORMAL LOW (ref 5–15)
BUN: 23 mg/dL (ref 8–23)
CO2: 40 mmol/L — ABNORMAL HIGH (ref 22–32)
Calcium: 8.5 mg/dL — ABNORMAL LOW (ref 8.9–10.3)
Chloride: 101 mmol/L (ref 98–111)
Creatinine, Ser: 0.61 mg/dL (ref 0.61–1.24)
GFR, Estimated: >60 mL/min (ref >60)
Glucose, Bld: 201 mg/dL — ABNORMAL HIGH (ref 70–99)
Potassium: 4.1 mmol/L (ref 3.5–5.1)
Sodium: 145 mmol/L (ref 135–145)

## 2022-04-14 LAB — GLUCOSE, CAPILLARY
Glucose-Capillary: 118 mg/dL — ABNORMAL HIGH (ref 70–99)
Glucose-Capillary: 122 mg/dL — ABNORMAL HIGH (ref 70–99)
Glucose-Capillary: 123 mg/dL — ABNORMAL HIGH (ref 70–99)
Glucose-Capillary: 127 mg/dL — ABNORMAL HIGH (ref 70–99)
Glucose-Capillary: 204 mg/dL — ABNORMAL HIGH (ref 70–99)

## 2022-04-14 LAB — TROPONIN I (HIGH SENSITIVITY): Troponin I (High Sensitivity): 16 ng/L (ref <18)

## 2022-04-14 LAB — HEPARIN LEVEL (UNFRACTIONATED): Heparin Unfractionated: 0.1 IU/mL — ABNORMAL LOW (ref 0.30–0.70)

## 2022-04-14 MED ORDER — STERILE WATER FOR INJECTION IV SOLN
INTRAVENOUS | Status: DC
  Filled 2022-04-14: qty 1000

## 2022-04-14 MED ORDER — FENTANYL 2500MCG IN NS 250ML (10MCG/ML) PREMIX INFUSION
0.0000 ug/h | INTRAVENOUS | Status: DC
  Administered 2022-04-15: 100 ug/h via INTRAVENOUS
  Filled 2022-04-14: qty 250

## 2022-04-14 MED ORDER — SODIUM BICARBONATE 8.4 % IV SOLN: 100.0000 meq | Freq: Once | INTRAVENOUS | Status: AC

## 2022-04-14 MED ORDER — HEPARIN (PORCINE) 25000 UT/250ML-% IV SOLN
1100.0000 [IU]/h | INTRAVENOUS | Status: DC
  Administered 2022-04-14: 850 [IU]/h via INTRAVENOUS
  Administered 2022-04-15 – 2022-04-16 (×2): 1100 [IU]/h via INTRAVENOUS
  Filled 2022-04-14 (×3): qty 250

## 2022-04-14 MED ORDER — SODIUM CHLORIDE 0.9 % IV SOLN
2.0000 g | Freq: Two times a day (BID) | INTRAVENOUS | Status: DC
  Administered 2022-04-14 – 2022-04-16 (×6): 2 g via INTRAVENOUS
  Filled 2022-04-14 (×6): qty 12.5

## 2022-04-14 MED ORDER — HEPARIN BOLUS VIA INFUSION
3200.0000 [IU] | Freq: Once | INTRAVENOUS | Status: AC
  Administered 2022-04-14: 3200 [IU] via INTRAVENOUS
  Filled 2022-04-14: qty 3200

## 2022-04-14 MED ORDER — ETOMIDATE 2 MG/ML IV SOLN
INTRAVENOUS | Status: AC
  Administered 2022-04-14: 50 mg
  Filled 2022-04-14: qty 20

## 2022-04-14 MED ORDER — NOREPINEPHRINE 4 MG/250ML-% IV SOLN
2.0000 ug/min | INTRAVENOUS | Status: DC
  Administered 2022-04-14: 6 ug/min via INTRAVENOUS
  Administered 2022-04-15: 5 ug/min via INTRAVENOUS
  Administered 2022-04-15: 3 ug/min via INTRAVENOUS
  Filled 2022-04-14 (×4): qty 250

## 2022-04-14 MED ORDER — NOREPINEPHRINE 4 MG/250ML-% IV SOLN: 0.0000 ug/min | INTRAVENOUS | Status: DC

## 2022-04-14 MED ORDER — SODIUM CHLORIDE 0.9 % IV BOLUS: 500.0000 mL | Freq: Once | INTRAVENOUS | Status: DC

## 2022-04-14 MED ORDER — SODIUM BICARBONATE 8.4 % IV SOLN
INTRAVENOUS | Status: AC
  Administered 2022-04-14: 100 meq via INTRAVENOUS
  Filled 2022-04-14: qty 50

## 2022-04-14 MED ORDER — LACTATED RINGERS IV BOLUS
1000.0000 mL | Freq: Once | INTRAVENOUS | Status: AC
  Administered 2022-04-14: 1000 mL via INTRAVENOUS

## 2022-04-14 MED ORDER — CHLORHEXIDINE GLUCONATE CLOTH 2 % EX PADS
6.0000 | MEDICATED_PAD | Freq: Every day | CUTANEOUS | Status: DC
  Administered 2022-04-14 – 2022-04-16 (×3): 6 via TOPICAL

## 2022-04-14 MED ORDER — DEXMEDETOMIDINE HCL IN NACL 400 MCG/100ML IV SOLN
0.4000 ug/kg/h | INTRAVENOUS | Status: DC
  Administered 2022-04-14 – 2022-04-16 (×4): 0.4 ug/kg/h via INTRAVENOUS
  Filled 2022-04-14 (×4): qty 100

## 2022-04-14 MED ORDER — SODIUM CHLORIDE 0.9% FLUSH
10.0000 mL | Freq: Two times a day (BID) | INTRAVENOUS | Status: DC
  Administered 2022-04-14 – 2022-04-16 (×6): 10 mL

## 2022-04-14 MED ORDER — ORAL CARE MOUTH RINSE: 15.0000 mL | OROMUCOSAL | Status: DC | PRN

## 2022-04-14 MED ORDER — HEPARIN BOLUS VIA INFUSION
1000.0000 [IU] | Freq: Once | INTRAVENOUS | Status: AC
  Administered 2022-04-15: 1000 [IU] via INTRAVENOUS
  Filled 2022-04-14: qty 1000

## 2022-04-14 MED ORDER — VANCOMYCIN HCL IN DEXTROSE 1-5 GM/200ML-% IV SOLN
1000.0000 mg | Freq: Once | INTRAVENOUS | Status: AC
  Administered 2022-04-14: 1000 mg via INTRAVENOUS
  Filled 2022-04-14: qty 200

## 2022-04-14 MED ORDER — VANCOMYCIN HCL 500 MG/100ML IV SOLN
500.0000 mg | Freq: Two times a day (BID) | INTRAVENOUS | Status: DC
  Administered 2022-04-14 – 2022-04-16 (×5): 500 mg via INTRAVENOUS
  Filled 2022-04-14 (×6): qty 100

## 2022-04-14 MED ORDER — SODIUM CHLORIDE 0.9 % IV BOLUS: 1000.0000 mL | Freq: Once | INTRAVENOUS | Status: DC

## 2022-04-14 MED ORDER — NOREPINEPHRINE 4 MG/250ML-% IV SOLN
INTRAVENOUS | Status: AC
  Administered 2022-04-14: 5 ug/min via INTRAVENOUS
  Filled 2022-04-14: qty 250

## 2022-04-14 MED ORDER — SUCCINYLCHOLINE CHLORIDE 200 MG/10ML IV SOSY
PREFILLED_SYRINGE | INTRAVENOUS | Status: AC
  Administered 2022-04-14: 75 mg
  Filled 2022-04-14: qty 10

## 2022-04-14 MED ORDER — FENTANYL 2500MCG IN NS 250ML (10MCG/ML) PREMIX INFUSION
INTRAVENOUS | Status: AC
  Administered 2022-04-14: 50 ug/h via INTRAVENOUS
  Filled 2022-04-14: qty 250

## 2022-04-14 MED ORDER — LACTATED RINGERS IV SOLN: INTRAVENOUS | Status: AC

## 2022-04-14 MED ORDER — SODIUM CHLORIDE 0.9% FLUSH: 10.0000 mL | INTRAVENOUS | Status: DC | PRN

## 2022-04-14 MED ORDER — SODIUM CHLORIDE 0.9 % IV SOLN
250.0000 mL | INTRAVENOUS | Status: DC
  Administered 2022-04-14: 250 mL via INTRAVENOUS

## 2022-04-14 MED ORDER — ORAL CARE MOUTH RINSE
15.0000 mL | OROMUCOSAL | Status: DC
  Administered 2022-04-14 – 2022-04-17 (×28): 15 mL via OROMUCOSAL

## 2022-04-14 MED ORDER — AMIODARONE HCL IN DEXTROSE 360-4.14 MG/200ML-% IV SOLN
60.0000 mg/h | INTRAVENOUS | Status: AC
  Administered 2022-04-14 (×2): 60 mg/h via INTRAVENOUS
  Filled 2022-04-14 (×2): qty 200

## 2022-04-14 MED ORDER — IPRATROPIUM-ALBUTEROL 0.5-2.5 (3) MG/3ML IN SOLN
3.0000 mL | RESPIRATORY_TRACT | Status: DC
  Administered 2022-04-14 – 2022-04-15 (×5): 3 mL via RESPIRATORY_TRACT
  Filled 2022-04-14 (×5): qty 3

## 2022-04-14 MED ORDER — AMIODARONE HCL IN DEXTROSE 360-4.14 MG/200ML-% IV SOLN
30.0000 mg/h | INTRAVENOUS | Status: DC
  Administered 2022-04-14: 30 mg/h via INTRAVENOUS
  Filled 2022-04-14: qty 200

## 2022-04-14 NOTE — Progress Notes (Addendum)
During patient's round at [place] patient was laying down on bed with eyes close resp. even and non labored, heart rhythm was WNL. At 0200 am this nurse went for the lunch break. CNA and charge nurse were aware. Around 0206 CNA called and said that she found patient on the floor. Full body assessment done, patient was laying down on the bathroom floor, with out responding to verbal command. Not open area or bruising noted at this time. RRT called and patient transferred to ICU.

## 2022-04-14 NOTE — Progress Notes (Signed)
ANTICOAGULATION CONSULT NOTE - Initial Consult  Pharmacy Consult for heparin gtt  Indication: atrial fibrillation  Allergies  Allergen Reactions   Norvasc [Amlodipine] Itching   Parafon Forte Dsc [Chlorzoxazone] Itching    Patient Measurements: Height: '5\' 8"'$  (172.7 cm) Weight: 52.5 kg (115 lb 11.9 oz) IBW/kg (Calculated) : 68.4 Heparin Dosing Weight:     Vital Signs: Temp: 98.1 F (36.7 C) (10/14 0630) Temp Source: Axillary (10/14 0211) BP: 81/50 (10/14 0630) Pulse Rate: 77 (10/14 0630)  Labs: Recent Labs    04/27/2022 1228 04/16/2022 1700 04/13/22 0357 04/14/22 0211 04/14/22 0217  HGB 14.3 14.3 12.4* 12.6*  --   HCT 46.5 45.8 40.0 42.4  --   PLT 380 420* 395 403*  --   CREATININE 0.63  --  0.52*  --  0.61    Estimated Creatinine Clearance: 52.9 mL/min (by C-G formula based on SCr of 0.61 mg/dL).   Medical History: Past Medical History:  Diagnosis Date   Anxiety    Cataracts, bilateral    removed by surgery   COPD (chronic obstructive pulmonary disease) ([place])    Depression, major, single episode, moderate ([place]) 12/18/2018   Glaucoma    both eyes   Glaucoma    bilateral   H. pylori infection 09/17/2019   S/p treatment with Prevpac. Documented eradiation via breath test on 11/03/19.    Hyperlipidemia    Hypertension    Pre-diabetes    diet controlled   Skin cancer, basal cell    arms, face   Wears dentures    full    Medications:  Medications Prior to Admission  Medication Sig Dispense Refill Last Dose   albuterol (PROVENTIL) (2.5 MG/3ML) 0.083% nebulizer solution Take 2.5 mg by nebulization every 6 (six) hours as needed for wheezing.   04/16/2022   albuterol (VENTOLIN HFA) 108 (90 Base) MCG/ACT inhaler Inhale 2 puffs into the lungs every 4 (four) hours as needed. 54 g 3 04/11/2022   ALPRAZolam (XANAX) 0.5 MG tablet TAKE 1 TABLET BY MOUTH EVERY DAY AT BEDTIME AS NEEDED 30 tablet 5 04/11/2022   Ascorbic Acid (VITAMIN C PO) Take 1 tablet by mouth daily.    04/11/2022   aspirin EC 81 MG tablet Take 81 mg by mouth daily.   Past Week   budesonide-formoterol (SYMBICORT) 160-4.5 MCG/ACT inhaler INHALE TWO PUFFS BY MOUTH TWICE DAILY ** STOP SPIRIVA ** 1 each 6 04/11/2022   docusate sodium (COLACE) 100 MG capsule Take 100 mg by mouth 2 (two) times daily as needed for mild constipation.   05/01/2022   dorzolamide-timolol (COSOPT) 22.3-6.8 MG/ML ophthalmic solution Place 1 drop into both eyes 2 (two) times daily. 10 mL 0 04/24/2022   Fluticasone-Umeclidin-Vilant (TRELEGY ELLIPTA) 100-62.5-25 MCG/ACT AEPB Inhale 1 puff into the lungs daily. 60 each 0 04/11/2022   ipratropium-albuterol (DUONEB) 0.5-2.5 (3) MG/3ML SOLN USE 3 ML VIA NEBULIZER TWICE DAILY 180 mL 6 04/26/2022   mirtazapine (REMERON SOL-TAB) 30 MG disintegrating tablet Take 1 tablet (30 mg total) by mouth at bedtime. 30 tablet 5 04/11/2022   Multiple Vitamins-Minerals (IMMUNE SUPPORT PO) Take 1 tablet by mouth daily.   04/11/2022   OXYGEN Inhale 4 L into the lungs continuous. 4lpm   04/23/2022   pravastatin (PRAVACHOL) 40 MG tablet Take 1 tablet (40 mg total) by mouth at bedtime. 90 tablet 1 04/11/2022   Saw Palmetto 450 MG CAPS Take 450 mg by mouth at bedtime.   04/11/2022   Scheduled:   arformoterol  15 mcg Nebulization BID  ascorbic acid  500 mg Oral Daily   aspirin EC  81 mg Oral Daily   budesonide (PULMICORT) nebulizer solution  0.5 mg Nebulization BID   Chlorhexidine Gluconate Cloth  6 each Topical Daily   dorzolamide-timolol  1 drop Both Eyes BID   guaiFENesin  600 mg Oral BID   heparin  3,200 Units Intravenous Once   ipratropium-albuterol  3 mL Nebulization Q4H   mirtazapine  30 mg Oral QHS   pravastatin  40 mg Oral QHS   Infusions:   sodium chloride 20 mL/hr at 04/14/22 0639   amiodarone 60 mg/hr (04/14/22 0923)   amiodarone     fentaNYL infusion INTRAVENOUS 120 mcg/hr (04/14/22 0818)   heparin     norepinephrine (LEVOPHED) Adult infusion 6 mcg/min (04/14/22 0818)   PRN:  acetaminophen **OR** acetaminophen, ALPRAZolam, ondansetron **OR** ondansetron (ZOFRAN) IV Anti-infectives (From admission, onward)    Start     Dose/Rate Route Frequency Ordered Stop   04/13/22 1600  azithromycin (ZITHROMAX) 500 mg in sodium chloride 0.9 % 250 mL IVPB  Status:  Discontinued        500 mg 250 mL/hr over 60 Minutes Intravenous Every 24 hours 04/10/2022 1700 04/14/22 0908   04/13/22 1000  cefTRIAXone (ROCEPHIN) 1 g in sodium chloride 0.9 % 100 mL IVPB  Status:  Discontinued        1 g 200 mL/hr over 30 Minutes Intravenous Every 24 hours 04/29/2022 1516 04/14/22 0908   04/06/2022 1315  cefTRIAXone (ROCEPHIN) 1 g in sodium chloride 0.9 % 100 mL IVPB        1 g 200 mL/hr over 30 Minutes Intravenous  Once 04/14/2022 1312 04/10/2022 1437   04/27/2022 1315  azithromycin (ZITHROMAX) 500 mg in sodium chloride 0.9 % 250 mL IVPB        500 mg 250 mL/hr over 60 Minutes Intravenous  Once 04/29/2022 1312 04/18/2022 1654       Assessment: George Meyer a 81 y.o. male requires anticoagulation with a heparin iv infusion for the indication of  atrial fibrillation. Heparin gtt will be started following pharmacy protocol per pharmacy consult. Patient is not on previous oral anticoagulant that will require aPTT/HL correlation before transitioning to only HL monitoring.   Goal of Therapy:  Heparin level 0.3-0.7 units/ml Monitor platelets by anticoagulation protocol: Yes   Plan:  Give 3200 units bolus x 1 Start heparin infusion at 850 units/hr Check anti-Xa level in 8 hours and daily while on heparin Continue to monitor H&H and platelets   Donna Christen Sonnia Strong 04/14/2022,9:26 AM

## 2022-04-14 NOTE — Progress Notes (Addendum)
Progress Note   Patient: George Meyer XIH:[id number] DOB: 1941-03-16 DOA: 04/15/2022     2 DOS: the patient was seen and examined on 04/14/2022   Brief hospital course: [name] was admitted to the hospital with the working diagnosis of pneumonia, complicated with septic shock and atrial fibrillation with RVR  81 yo male with the past medical history of depression, hypertension, dyslipidemia, and COPD who presented with tachycardia. Patient reported chest pain, cough and dyspnea. Productive cough. Patient was evaluated by his primary care provider and was referred to the ED for further evaluation. His blood pressure was 128/78, HR 95, RR 29 and 02 saturation 98%, lungs with bilateral rhonchi, mild expiratory wheezing, heart with S1 and S2 present and rhythmic, abdomen not distended and no lower extremity edema.   Na 146, K 4,5 Cl 98 bicarbonate 39, glucose 111, bun 18 cr 0,63  Wbc 14, hgb 14.3 plt 380  D dimer 14,8 Sars covid 19 negative  Urine analysis SG >1.046, negative protein, negative leukocytes.   Chest radiograph with left lower lobe infiltrate with left pleural effusion.   Chest CT with centrilobular emphysema, with patchy infiltrate left lower and upper lobe, interlobular septal thickening. Positive left pleural effusion.  Two solid pulmonary nodules (increased in size) right upper lobe, enlarged mediastinal lymph nodes and new sclerotic lesion of T 12.   EKG SVT 170 bpm, right axis deviation, right bundle branch block, SVT rhythm with no significant ST segment or T wave changes.   Patient placed on supplemental 02 per [place] and antibiotic therapy.   10/14 patient was found unresponsive, developed acute respiratory acidosis, with Pc02 123 and pH 7,06, placed on invasive mechanical ventilation.  Had hypotension and was placed on norepinephrine infusion per peripheral access.          Assessment and Plan: * Sepsis due to pneumonia [facility]) Septic shock due to  community acquired pneumonia (presetn on admission).  Lactic acidosis  Patient continue with hypotension, on norepinephrine infusion (peripheral 7 mcg per min). Continue atrial fibrillation with RVR 150's.  Wbc 16,8  Cultures with no growth.   Plan: Patient needs better rate control atrial fibrillation to improve his hemodynamics.  Start patient on amiodarone drip Add 1000 ml bolus NS.  Change antibiotic therapy to Vancomycin and Cefepime. PIC line requested   Acute respiratory failure, hypercapnia and hypoxemia, Ventilator PRVC Peep 5 and Fi02 50% with no dyssynchrony  Currently off sedation with good toleration.  Plan to keep on invasive mechanical ventilation until patient more hemodynamically stable.    Atrial fibrillation with RVR ([place]) Patient continue with atrial fibrillation with RVR Currently hypotensive  Plan for rate control with amiodarone.  Chads Vasc score 2  Add anticoagulation with heparin  Echocardiogram after better rate control.   HTN (hypertension) Patient hypotensive.  On hold all antihypertensive medications.   Centrilobular emphysema (HCC) Respiratory failure has improved with mechanical ventilation Follow up blood gas with pH 7,51, Pc02 52, Pa02 328 bicarbonate 42.  Continue bronchodilatory therapy Hold on systemic steroids for now When hemodynamically more stable attempt to liberate from mechanical ventilation   Hypernatremia Renal function with serum cr at 0,61, K is 4,1 and serum bicarbonate at 40. Mg is 2,1  Patient currently NPO Continue volume resuscitation with isotonic saline.  Follow up renal function and electrolytes in am.   Hyperlipidemia Continue with pravastatin   Glaucoma -Affecting both eyes -Continue outpatient follow-up with PCP/ophthalmologist -Continue the use of Cosopt eyedrops.  GERD (gastroesophageal reflux disease) -  Continue PPI.  Generalized anxiety disorder Continue with mirtazapine and alprazolam.    Pulmonary nodules Worsening pulmonary nodules, with mediastinal lymph nodes along with sclerotic lesion in T 12.  High pre test probability for malignancy.  Will need follow up once patient more stable.         Subjective: Patient on mechanical ventilation, he is awake and following commands   Physical Exam: Vitals:   04/14/22 0545 04/14/22 0600 04/14/22 0615 04/14/22 0630  BP: '92/65 98/62 95/61 '$ (!) 81/50  Pulse: 75 79 75 77  Resp: '18 16 18 18  '$ Temp: (!) 83.5 F (28.6 C) (!) 97.5 F (36.4 C) 97.9 F (36.6 C) 98.1 F (36.7 C)  TempSrc:      SpO2: 100% 100% 100% 100%  Weight:      Height:       Neurology awake and alert ENT ET tube in place Cardiovascular with S1 and S2 present and tachycardic, irregularly irregular, with no gallops.  Respiratory with no wheezing, scattered rhonchi Abdomen with no distention  Warm lower extremities with no edema  Data Reviewed:    Family Communication: I spoke with patient's daughter at the bedside, we talked in detail about patient's condition, plan of care and prognosis and all questions were addressed.   Disposition: Status is: Inpatient Remains inpatient appropriate because: critically ill   Planned Discharge Destination:  to be determined     Time spent: 60 minutes critical care time   Author: Tawni Millers, MD 04/14/2022 9:15 AM  For on call review [email or web address].

## 2022-04-14 NOTE — Progress Notes (Addendum)
eLink Physician-Brief Progress Note Patient Name: George Meyer DOB: Feb 06, 1941 MRN: [id number]   Date of Service  04/14/2022  HPI/Events of Note  Patient admitted with pneumonia and sepsis then developed acute hypoxemic / hypercapnic respiratory failure requiring intubation and mechanical ventilation.  eICU Interventions  New Patient Evaluation. Ventilator settings adjusted in response to ABG result.        Kerry Kass Jolissa Kapral 04/14/2022, 4:24 AM

## 2022-04-14 NOTE — Assessment & Plan Note (Addendum)
Patient continue with atrial fibrillation with RVR Currently hypotensive  Plan for rate control with amiodarone.  Chads Vasc score 2  Add anticoagulation with heparin  Echocardiogram after better rate control.

## 2022-04-14 NOTE — Progress Notes (Signed)
Patient has periods of rapid heart rate which may increase to 160 beats per minute on monitor appears to be a-fib then returns to normal sinus rhythm. Sent message to Dr. Cathlean Sauer. Left arm IV watch had check IV site. Checked site and was able to return blood from IV. Changed IV watch probe and no check messages given.

## 2022-04-14 NOTE — Progress Notes (Signed)
[place] for heparin gtt  Indication: atrial fibrillation  Allergies  Allergen Reactions   Norvasc [Amlodipine] Itching   Parafon Forte Dsc [Chlorzoxazone] Itching    Patient Measurements: Height: '5\' 8"'$  (172.7 cm) Weight: 52.5 kg (115 lb 11.9 oz) IBW/kg (Calculated) : 68.4 Heparin Dosing Weight:     Vital Signs: Temp: 101.1 F (38.4 C) (10/14 2230) Temp Source: Core (Comment) (10/14 2200) BP: 107/65 (10/14 2230) Pulse Rate: 59 (10/14 2230)  Labs: Recent Labs    04/03/2022 1228 04/10/2022 1700 04/13/22 0357 04/14/22 0211 04/14/22 0217 04/14/22 1932  HGB 14.3 14.3 12.4* 12.6*  --   --   HCT 46.5 45.8 40.0 42.4  --   --   PLT 380 420* 395 403*  --   --   HEPARINUNFRC  --   --   --   --   --  0.10*  CREATININE 0.63  --  0.52*  --  0.61  --      Estimated Creatinine Clearance: 52.9 mL/min (by C-G formula based on SCr of 0.61 mg/dL).   Medical History: Past Medical History:  Diagnosis Date   Anxiety    Cataracts, bilateral    removed by surgery   COPD (chronic obstructive pulmonary disease) ([place])    Depression, major, single episode, moderate ([place]) 12/18/2018   Glaucoma    both eyes   Glaucoma    bilateral   H. pylori infection 09/17/2019   S/p treatment with Prevpac. Documented eradiation via breath test on 11/03/19.    Hyperlipidemia    Hypertension    Pre-diabetes    diet controlled   Skin cancer, basal cell    arms, face   Wears dentures    full    Medications:  Medications Prior to Admission  Medication Sig Dispense Refill Last Dose   albuterol (PROVENTIL) (2.5 MG/3ML) 0.083% nebulizer solution Take 2.5 mg by nebulization every 6 (six) hours as needed for wheezing.   04/03/2022   albuterol (VENTOLIN HFA) 108 (90 Base) MCG/ACT inhaler Inhale 2 puffs into the lungs every 4 (four) hours as needed. 54 g 3 04/11/2022   ALPRAZolam (XANAX) 0.5 MG tablet TAKE 1 TABLET BY MOUTH EVERY DAY AT BEDTIME AS NEEDED 30 tablet 5  04/11/2022   Ascorbic Acid (VITAMIN C PO) Take 1 tablet by mouth daily.   04/11/2022   aspirin EC 81 MG tablet Take 81 mg by mouth daily.   Past Week   budesonide-formoterol (SYMBICORT) 160-4.5 MCG/ACT inhaler INHALE TWO PUFFS BY MOUTH TWICE DAILY ** STOP SPIRIVA ** 1 each 6 04/02/2022   docusate sodium (COLACE) 100 MG capsule Take 100 mg by mouth 2 (two) times daily as needed for mild constipation.   04/04/2022   dorzolamide-timolol (COSOPT) 22.3-6.8 MG/ML ophthalmic solution Place 1 drop into both eyes 2 (two) times daily. 10 mL 0 04/13/2022   Fluticasone-Umeclidin-Vilant (TRELEGY ELLIPTA) 100-62.5-25 MCG/ACT AEPB Inhale 1 puff into the lungs daily. 60 each 0 04/11/2022   ipratropium-albuterol (DUONEB) 0.5-2.5 (3) MG/3ML SOLN USE 3 ML VIA NEBULIZER TWICE DAILY 180 mL 6 04/02/2022   mirtazapine (REMERON SOL-TAB) 30 MG disintegrating tablet Take 1 tablet (30 mg total) by mouth at bedtime. 30 tablet 5 04/11/2022   Multiple Vitamins-Minerals (IMMUNE SUPPORT PO) Take 1 tablet by mouth daily.   04/11/2022   OXYGEN Inhale 4 L into the lungs continuous. 4lpm   04/01/2022   pravastatin (PRAVACHOL) 40 MG tablet Take 1 tablet (40 mg total) by mouth at bedtime.  90 tablet 1 04/11/2022   Saw Palmetto 450 MG CAPS Take 450 mg by mouth at bedtime.   04/11/2022   Scheduled:   arformoterol  15 mcg Nebulization BID   ascorbic acid  500 mg Oral Daily   aspirin EC  81 mg Oral Daily   budesonide (PULMICORT) nebulizer solution  0.5 mg Nebulization BID   Chlorhexidine Gluconate Cloth  6 each Topical Daily   dorzolamide-timolol  1 drop Both Eyes BID   guaiFENesin  600 mg Oral BID   ipratropium-albuterol  3 mL Nebulization Q4H   mirtazapine  30 mg Oral QHS   mouth rinse  15 mL Mouth Rinse Q2H   pravastatin  40 mg Oral QHS   sodium chloride flush  10-40 mL Intracatheter Q12H   Infusions:   sodium chloride 10 mL/hr at 04/14/22 2201   amiodarone 30 mg/hr (04/14/22 2201)   ceFEPime (MAXIPIME) IV Stopped (04/14/22  2158)   dexmedetomidine (PRECEDEX) IV infusion 0.4 mcg/kg/hr (04/14/22 2201)   fentaNYL infusion INTRAVENOUS 100 mcg/hr (04/14/22 2201)   heparin 850 Units/hr (04/14/22 2201)   lactated ringers 100 mL/hr at 04/14/22 2305   norepinephrine (LEVOPHED) Adult infusion 6 mcg/min (04/14/22 2201)   vancomycin 500 mg (04/14/22 2225)   PRN: acetaminophen **OR** acetaminophen, ALPRAZolam, ondansetron **OR** ondansetron (ZOFRAN) IV, mouth rinse, sodium chloride flush Anti-infectives (From admission, onward)    Start     Dose/Rate Route Frequency Ordered Stop   04/14/22 2100  vancomycin (VANCOREADY) IVPB 500 mg/100 mL        500 mg 100 mL/hr over 60 Minutes Intravenous Every 12 hours 04/14/22 0929     04/14/22 1015  vancomycin (VANCOCIN) IVPB 1000 mg/200 mL premix        1,000 mg 200 mL/hr over 60 Minutes Intravenous  Once 04/14/22 0928 04/14/22 1116   04/14/22 1015  ceFEPIme (MAXIPIME) 2 g in sodium chloride 0.9 % 100 mL IVPB        2 g 200 mL/hr over 30 Minutes Intravenous Every 12 hours 04/14/22 0928     04/13/22 1600  azithromycin (ZITHROMAX) 500 mg in sodium chloride 0.9 % 250 mL IVPB  Status:  Discontinued        500 mg 250 mL/hr over 60 Minutes Intravenous Every 24 hours 04/30/2022 1700 04/14/22 0908   04/13/22 1000  cefTRIAXone (ROCEPHIN) 1 g in sodium chloride 0.9 % 100 mL IVPB  Status:  Discontinued        1 g 200 mL/hr over 30 Minutes Intravenous Every 24 hours 04/28/2022 1516 04/14/22 0908   04/08/2022 1315  cefTRIAXone (ROCEPHIN) 1 g in sodium chloride 0.9 % 100 mL IVPB        1 g 200 mL/hr over 30 Minutes Intravenous  Once 04/05/2022 1312 04/11/2022 1437   04/08/2022 1315  azithromycin (ZITHROMAX) 500 mg in sodium chloride 0.9 % 250 mL IVPB        500 mg 250 mL/hr over 60 Minutes Intravenous  Once 05/01/2022 1312 04/26/2022 1654       Assessment: George Meyer a 81 y.o. male requires anticoagulation with a heparin iv infusion for the indication of  atrial fibrillation. Heparin gtt will be  started following pharmacy protocol per pharmacy consult. Patient is not on previous oral anticoagulant that will require aPTT/HL correlation before transitioning to only HL monitoring.   Initial heparin level undetectable. No issues with infusion or overt s/sx of bleeding   Goal of Therapy:  Heparin level 0.3-0.7 units/ml Monitor platelets by anticoagulation  protocol: Yes   Plan:  Give 1000 units bolus x 1 Increase heparin infusion to 1000 units/hr Check anti-Xa level in 8 hours and daily while on heparin Continue to monitor H&H and platelets   Georga Bora, PharmD Clinical Pharmacist 04/14/2022 11:32 PM Please check AMION for all [place] numbers

## 2022-04-14 NOTE — Progress Notes (Signed)
Pt resting comfortably, Levo'@4mcg'$ , Fentanyl'@125mcg'$ , pt opens eyes follows commands and nods head yes/no appropriately and tolerating vent settings. Dr. Josph Macho updated on all changes and pt status. [id number] spoke to [place] and gave update on pt condition/status, shes on her way to come see pt.

## 2022-04-14 NOTE — Progress Notes (Signed)
Peripherally Inserted Central Catheter Placement  The IV Nurse has discussed with the patient and/or persons authorized to consent for the patient, the purpose of this procedure and the potential benefits and risks involved with this procedure.  The benefits include less needle sticks, lab draws from the catheter, and the patient may be discharged home with the catheter. Risks include, but not limited to, infection, bleeding, blood clot (thrombus formation), and puncture of an artery; nerve damage and irregular heartbeat and possibility to perform a PICC exchange if needed/ordered by physician.  Alternatives to this procedure were also discussed.  Bard Power PICC patient education guide, fact sheet on infection prevention and patient information card has been provided to patient /or left at bedside.    PICC Placement Documentation  PICC Double Lumen 34/19/62 Right Basilic 39 cm 0 cm (Active)  Indication for Insertion or Continuance of Line Vasoactive infusions 04/14/22 1154  Exposed Catheter (cm) 0 cm 04/14/22 1154  Site Assessment Clean, Dry, Intact 04/14/22 1154  Lumen #1 Status Saline locked;Flushed;Blood return noted 04/14/22 1154  Lumen #2 Status Saline locked;Flushed;Blood return noted 04/14/22 1154  Dressing Type Transparent;Securing device 04/14/22 1154  Dressing Status Antimicrobial disc in place;Clean, Dry, Intact 04/14/22 1154  Safety Lock Not Applicable 22/97/98 9211  Line Care Connections checked and tightened 04/14/22 1154  Line Adjustment (NICU/IV Team Only) No 04/14/22 1154  Dressing Intervention New dressing 04/14/22 1154  Dressing Change Due 04/21/22 04/14/22 [place] 04/14/2022, 11:55 AM

## 2022-04-14 NOTE — Progress Notes (Signed)
0203 Called to room and patient found on floor. Spoke with patient but he would not speak. He turned and looked at me but did not speak. Patient has emesis on his gown.  0205 Rapid response called bp158/64 o2 sats 96% on 4L. Respirations are agonal. Pulses felt 0210 Zoll pads placed on patient. MD in room and placing orders 0222 patient moved to the bed and on 6L via mask. Patient is still not responding.  0226 patient is being moved to ICU, respiratory started bagging patient. EKG showed nsr with RBBB. Pulses still palpable.  0230 patient moved to ICU bed 0240 patient intubated 7.5  Critical lab called pH 7.06 pCO2 >123. Dr. Clearence Ped notified in room.

## 2022-04-14 NOTE — Assessment & Plan Note (Signed)
Worsening pulmonary nodules, with mediastinal lymph nodes along with sclerotic lesion in T 12.  High pre test probability for malignancy.  Will need follow up once patient more stable.

## 2022-04-14 NOTE — Hospital Course (Signed)
Mr. Skoda was admitted to the hospital with the working diagnosis of pneumonia, complicated with septic shock and atrial fibrillation with RVR  81 yo male with the past medical history of depression, hypertension, dyslipidemia, and COPD who presented with tachycardia. Patient reported chest pain, cough and dyspnea. Productive cough. Patient was evaluated by his primary care provider and was referred to the ED for further evaluation. His blood pressure was 128/78, HR 95, RR 29 and 02 saturation 98%, lungs with bilateral rhonchi, mild expiratory wheezing, heart with S1 and S2 present and rhythmic, abdomen not distended and no lower extremity edema.   Na 146, K 4,5 Cl 98 bicarbonate 39, glucose 111, bun 18 cr 0,63  Wbc 14, hgb 14.3 plt 380  D dimer 14,8 Sars covid 19 negative  Urine analysis SG >1.046, negative protein, negative leukocytes.   Chest radiograph with left lower lobe infiltrate with left pleural effusion.   Chest CT with centrilobular emphysema, with patchy infiltrate left lower and upper lobe, interlobular septal thickening. Positive left pleural effusion.  Two solid pulmonary nodules (increased in size) right upper lobe, enlarged mediastinal lymph nodes and new sclerotic lesion of T 12.   EKG SVT 170 bpm, right axis deviation, right bundle branch block, SVT rhythm with no significant ST segment or T wave changes.   Patient placed on supplemental 02 per [place] and antibiotic therapy.   10/14 patient was found unresponsive, developed acute respiratory acidosis, with Pc02 123 and pH 7,06, placed on invasive mechanical ventilation.  Had hypotension and was placed on norepinephrine infusion per peripheral access.

## 2022-04-14 NOTE — ED Provider Notes (Signed)
Called to ICU for airway management. Patient recently admitted for SVT and pneumonia, found on the floor of the bathroom, unresponsive. Forehead contusion concerning for head injury. Respirations are agonal, pulses are intact, patient does not respond to verbal or painful stimuli. Pupils are equal.   Procedure Name: Intubation Date/Time: 04/14/2022 2:54 AM  Performed by: Truddie Hidden, MDPre-anesthesia Checklist: Patient identified, Patient being monitored, Emergency Drugs available, Timeout performed and Suction available Oxygen Delivery Method: Non-rebreather mask Preoxygenation: Pre-oxygenation with 100% oxygen Induction Type: Rapid sequence Ventilation: Mask ventilation without difficulty Laryngoscope Size: Glidescope and 3 Grade View: Grade I Tube size: 7.5 mm Number of attempts: 1 Placement Confirmation: ETT inserted through vocal cords under direct vision, CO2 detector and Breath sounds checked- equal and bilateral Secured at: 23 cm Tube secured with: ETT holder     Care of the patient to be continued by primary team.    Truddie Hidden, MD 04/14/22 [id number]

## 2022-04-14 NOTE — Progress Notes (Addendum)
0230 Pt arrived to ICU, unresponsive and agonal.  0240 '75mg'$  succs and '15mg'$  Etomidate 0242 successful intubation ETT at 23 at lip, started Levo at 47mg 0245 LR bolus, increased Levo to 857m 0304 10068mbicarb, decreased levo to [id number] levo decreased to 6 0330 pt taken to CT, levo decreased to 5 0345 pt back from CT awake and following commads Elink updated, FYI txt sent to Dr. ZierJosph Macho

## 2022-04-14 NOTE — Progress Notes (Signed)
Pharmacy Antibiotic Note  George Meyer a 81 y.o. male admitted on 04/22/2022 with sepsis, pulmonary source suspected.  Pharmacy has been consulted for vancomycin and cefepime dosing.  Plan: Vancomycin '500mg'$  IV every 12 hours.  Goal trough 15-20 mcg/mL. Cefepime 2gm IV every 12 hours.  Medical History: Past Medical History:  Diagnosis Date   Anxiety    Cataracts, bilateral    removed by surgery   COPD (chronic obstructive pulmonary disease) ([place])    Depression, major, single episode, moderate ([place]) 12/18/2018   Glaucoma    both eyes   Glaucoma    bilateral   H. pylori infection 09/17/2019   S/p treatment with Prevpac. Documented eradiation via breath test on 11/03/19.    Hyperlipidemia    Hypertension    Pre-diabetes    diet controlled   Skin cancer, basal cell    arms, face   Wears dentures    full    Allergies:  Allergies  Allergen Reactions   Norvasc [Amlodipine] Itching   Parafon Forte Dsc [Chlorzoxazone] Itching    Filed Weights   04/13/22 0500 04/14/22 0500  Weight: 54.7 kg (120 lb 9.5 oz) 52.5 kg (115 lb 11.9 oz)       Latest Ref Rng & Units 04/14/2022    2:11 AM 04/13/2022    3:57 AM 04/16/2022    5:00 PM  CBC  WBC 4.0 - 10.5 K/uL 16.8  15.4  13.6   Hemoglobin 13.0 - 17.0 g/dL 12.6  12.4  14.3   Hematocrit 39.0 - 52.0 % 42.4  40.0  45.8   Platelets 150 - 400 K/uL 403  395  420      Estimated Creatinine Clearance: 52.9 mL/min (by C-G formula based on SCr of 0.61 mg/dL).  Antibiotics Given (last 72 hours)     Date/Time Action Medication Dose Rate   04/13/2022 1407 New Bag/Given  [no pump available]   cefTRIAXone (ROCEPHIN) 1 g in sodium chloride 0.9 % 100 mL IVPB 1 g 200 mL/hr   04/09/2022 1554 New Bag/Given   azithromycin (ZITHROMAX) 500 mg in sodium chloride 0.9 % 250 mL IVPB 500 mg 250 mL/hr   04/13/22 0825 New Bag/Given   cefTRIAXone (ROCEPHIN) 1 g in sodium chloride 0.9 % 100 mL IVPB 1 g 200 mL/hr   04/13/22 1548 New Bag/Given    azithromycin (ZITHROMAX) 500 mg in sodium chloride 0.9 % 250 mL IVPB 500 mg 250 mL/hr       Antimicrobials this admission:  Cefepime 04/14/2022  >>  vancomycin 04/14/2022  >>  Azithromycin 10/12>10/13 Ceftriaxone 10/12>10/13 Microbiology results: 04/02/2022  BCx: NGx2days 04/27/2022  Resp Panel: negative   04/14/2022  MRSA PCR: sent  Thank you for allowing pharmacy to be a part of this patient's care.  Thomasenia Sales, PharmD Clinical Pharmacist

## 2022-04-14 NOTE — Assessment & Plan Note (Addendum)
Septic shock due to community acquired pneumonia (presetn on admission).  Lactic acidosis  Patient has improved hemodynamically; currently off amiodarone and norepinephrine.   -Frustrating episodes of bradycardia with irregular rhythm.  Blood culture without growth; no fever and having elevated WBCs  -Continue current IV antibiotics -Continue supportive care -Attempt extubation

## 2022-04-14 NOTE — Progress Notes (Signed)
Rapid response called when patient was found on floor in the bathroom.  Patient was unresponsive.  He had agonal breathing.  Heart rate and blood pressure was stable.  Oxygen sat was reading mid 90s.  EKG showed sinus tachycardia with a rate of 96.  No ST elevation.  Right bundle branch block was redemonstrated.  Patient was moved to bed.  He agreed to have a small abrasion on his left forehead.  CT scan without contrast of head ordered.  Due to breathing CT scan of chest also ordered.  Patient was not stable to go to CT.  We moved him to the ICU and intubated him.  VBG was done that showed a PCO2 of greater than 123, and a pH of 7.06.  Repeat blood gas will be done in 1 hour since starting the vent.  Patient was given 2 A of bicarb and started on a bicarb drip.  Patient became hypotensive after intubation, and was started on Levophed.  Levophed corrected the pressure.  Patient was also started on LR bolus.  LR chosen over normal saline due to hypernatremia.  Fingerstick was done that showed a glucose of 204.  Labs came back that showed improved sodium 145, bicarb elevated at 40 so bicarb drip was continued.  Troponin 16.  White blood cell count continues to climb at 16.8.  White blood cell count was previously 15.4, and before that 13.6.  Sputum culture ordered. Repeat UA ordered. Patient is already on Rocephin and Zithromax. Contacted pharm to discuss broadening the antibiotics. Will continue to follow.   CRITICAL CARE Performed by: [place]   Total critical care time: 65 minutes  Critical care time was exclusive of separately billable procedures and treating other patients.  Critical care was necessary to treat or prevent imminent or life-threatening deterioration.  Critical care was time spent personally by me on the following activities: development of treatment plan with patient and/or surrogate as well as nursing, discussions with consultants, evaluation of patient's response to  treatment, examination of patient, obtaining history from patient or surrogate, ordering and performing treatments and interventions, ordering and review of laboratory studies, ordering and review of radiographic studies, pulse oximetry and re-evaluation of patient's condition.

## 2022-04-14 NOTE — Progress Notes (Addendum)
Patient's daughter [name] called multiple different times no answer.

## 2022-04-14 NOTE — Assessment & Plan Note (Addendum)
-  Respiratory failure improved with mechanical ventilation -Successfully extubated on 04/15/2022; with transient desaturation and hypercapnic process that was able to be fixed with BiPAP usage. -Remains a high risk for requiring intubation with weak cough and overall frail stability to protect airways. -Requiring 5 L second supplementation currently. -Continue steroids and bronchodilator management -Pulmonology service consulted.

## 2022-04-15 ENCOUNTER — Inpatient Hospital Stay (HOSPITAL_COMMUNITY): Payer: PPO

## 2022-04-15 DIAGNOSIS — J9621 Acute and chronic respiratory failure with hypoxia: Secondary | ICD-10-CM

## 2022-04-15 DIAGNOSIS — I4891 Unspecified atrial fibrillation: Secondary | ICD-10-CM | POA: Diagnosis not present

## 2022-04-15 DIAGNOSIS — J189 Pneumonia, unspecified organism: Secondary | ICD-10-CM | POA: Diagnosis not present

## 2022-04-15 DIAGNOSIS — J432 Centrilobular emphysema: Secondary | ICD-10-CM | POA: Diagnosis not present

## 2022-04-15 DIAGNOSIS — F411 Generalized anxiety disorder: Secondary | ICD-10-CM | POA: Diagnosis not present

## 2022-04-15 DIAGNOSIS — R918 Other nonspecific abnormal finding of lung field: Secondary | ICD-10-CM

## 2022-04-15 DIAGNOSIS — J9622 Acute and chronic respiratory failure with hypercapnia: Secondary | ICD-10-CM

## 2022-04-15 LAB — CBC WITH DIFFERENTIAL/PLATELET
Abs Immature Granulocytes: 0.47 10*3/uL — ABNORMAL HIGH (ref 0.00–0.07)
Basophils Absolute: 0.1 10*3/uL (ref 0.0–0.1)
Basophils Relative: 1 %
Eosinophils Absolute: 0.1 10*3/uL (ref 0.0–0.5)
Eosinophils Relative: 1 %
HCT: 38.2 % — ABNORMAL LOW (ref 39.0–52.0)
Hemoglobin: 12.3 g/dL — ABNORMAL LOW (ref 13.0–17.0)
Immature Granulocytes: 2 %
Lymphocytes Relative: 9 %
Lymphs Abs: 1.9 10*3/uL (ref 0.7–4.0)
MCH: 31.9 pg (ref 26.0–34.0)
MCHC: 32.2 g/dL (ref 30.0–36.0)
MCV: 99.2 fL (ref 80.0–100.0)
Monocytes Absolute: 1.3 10*3/uL — ABNORMAL HIGH (ref 0.1–1.0)
Monocytes Relative: 6 %
Neutro Abs: 17.6 10*3/uL — ABNORMAL HIGH (ref 1.7–7.7)
Neutrophils Relative %: 81 %
Platelets: 345 10*3/uL (ref 150–400)
RBC: 3.85 MIL/uL — ABNORMAL LOW (ref 4.22–5.81)
RDW: 16.7 % — ABNORMAL HIGH (ref 11.5–15.5)
WBC: 21.6 10*3/uL — ABNORMAL HIGH (ref 4.0–10.5)
nRBC: 0 % (ref 0.0–0.2)

## 2022-04-15 LAB — BASIC METABOLIC PANEL
Anion gap: 10 (ref 5–15)
BUN: 31 mg/dL — ABNORMAL HIGH (ref 8–23)
CO2: 30 mmol/L (ref 22–32)
Calcium: 8.4 mg/dL — ABNORMAL LOW (ref 8.9–10.3)
Chloride: 102 mmol/L (ref 98–111)
Creatinine, Ser: 0.82 mg/dL (ref 0.61–1.24)
GFR, Estimated: >60 mL/min (ref >60)
Glucose, Bld: 124 mg/dL — ABNORMAL HIGH (ref 70–99)
Potassium: 3.3 mmol/L — ABNORMAL LOW (ref 3.5–5.1)
Sodium: 142 mmol/L (ref 135–145)

## 2022-04-15 LAB — BLOOD GAS, ARTERIAL
Acid-Base Excess: 8.8 mmol/L — ABNORMAL HIGH (ref 0.0–2.0)
Acid-Base Excess: 3.2 mmol/L — ABNORMAL HIGH (ref 0.0–2.0)
Acid-Base Excess: 6.2 mmol/L — ABNORMAL HIGH (ref 0.0–2.0)
Bicarbonate: 34.1 mmol/L — ABNORMAL HIGH (ref 20.0–28.0)
Bicarbonate: 35.5 mmol/L — ABNORMAL HIGH (ref 20.0–28.0)
Bicarbonate: 32.7 mmol/L — ABNORMAL HIGH (ref 20.0–28.0)
Drawn by: 27027
Drawn by: 27016
Drawn by: 442
FIO2: 70 %
FIO2: 35 %
O2 Content: 70 L/min
O2 Saturation: 96.1 %
O2 Saturation: 98.7 %
O2 Saturation: 91.1 %
Patient temperature: 37
Patient temperature: 37
Patient temperature: 37
pCO2 arterial: 48 mmHg (ref 32–48)
pCO2 arterial: 102 mmHg (ref 32–48)
pCO2 arterial: 54 mmHg — ABNORMAL HIGH (ref 32–48)
pH, Arterial: 7.46 — ABNORMAL HIGH (ref 7.35–7.45)
pH, Arterial: 7.15 — CL (ref 7.35–7.45)
pH, Arterial: 7.39 (ref 7.35–7.45)
pO2, Arterial: 72 mmHg — ABNORMAL LOW (ref 83–108)
pO2, Arterial: 121 mmHg — ABNORMAL HIGH (ref 83–108)
pO2, Arterial: 57 mmHg — ABNORMAL LOW (ref 83–108)

## 2022-04-15 LAB — GLUCOSE, CAPILLARY
Glucose-Capillary: 114 mg/dL — ABNORMAL HIGH (ref 70–99)
Glucose-Capillary: 104 mg/dL — ABNORMAL HIGH (ref 70–99)
Glucose-Capillary: 87 mg/dL (ref 70–99)
Glucose-Capillary: 108 mg/dL — ABNORMAL HIGH (ref 70–99)
Glucose-Capillary: 110 mg/dL — ABNORMAL HIGH (ref 70–99)

## 2022-04-15 LAB — HEPARIN LEVEL (UNFRACTIONATED)
Heparin Unfractionated: 0.27 IU/mL — ABNORMAL LOW (ref 0.30–0.70)
Heparin Unfractionated: 0.37 IU/mL (ref 0.30–0.70)

## 2022-04-15 LAB — CK: Total CK: 62 U/L (ref 49–397)

## 2022-04-15 MED ORDER — POTASSIUM CHLORIDE 10 MEQ/100ML IV SOLN
10.0000 meq | INTRAVENOUS | Status: AC
  Administered 2022-04-15 (×2): 10 meq via INTRAVENOUS
  Filled 2022-04-15 (×2): qty 100

## 2022-04-15 MED ORDER — METHYLPREDNISOLONE SODIUM SUCC 40 MG IJ SOLR
40.0000 mg | Freq: Two times a day (BID) | INTRAMUSCULAR | Status: DC
  Administered 2022-04-15 – 2022-04-17 (×4): 40 mg via INTRAVENOUS
  Filled 2022-04-15 (×4): qty 1

## 2022-04-15 MED ORDER — LORAZEPAM 2 MG/ML IJ SOLN
1.0000 mg | Freq: Once | INTRAMUSCULAR | Status: AC
  Administered 2022-04-15: 1 mg via INTRAVENOUS
  Filled 2022-04-15: qty 1

## 2022-04-15 MED ORDER — HEPARIN BOLUS VIA INFUSION
500.0000 [IU] | Freq: Once | INTRAVENOUS | Status: AC
  Administered 2022-04-15: 500 [IU] via INTRAVENOUS
  Filled 2022-04-15: qty 500

## 2022-04-15 MED ORDER — IPRATROPIUM-ALBUTEROL 0.5-2.5 (3) MG/3ML IN SOLN
3.0000 mL | Freq: Four times a day (QID) | RESPIRATORY_TRACT | Status: DC
  Administered 2022-04-15 – 2022-04-17 (×9): 3 mL via RESPIRATORY_TRACT
  Filled 2022-04-15 (×10): qty 3

## 2022-04-15 NOTE — Progress Notes (Signed)
Patient only had 150 mls of dark brown urine today. Does have foley and will flush well.George Meyer

## 2022-04-15 NOTE — Progress Notes (Signed)
Approximately 1 hour after patient was successfully extubated he developed altered mentation, agonal breathing and hypoxia.  Vital signs otherwise stable.  ABG: pH 7.15; CO2 102; oxygen while on BiPAP 121 and bicarb 35.5  Chest x-ray not demonstrating acute changes or congestion different to prior images (previous x-ray and CT scan of the chest).  Plan: -Patient started on BiPAP, with very low threshold for requiring reintubation. -On reexamination there is significant expiratory wheezing bilaterally; given concern for bronchospasm. -Continue nebulizer management and start treatment with steroids. -pulmonolgy service will be consulted.  Barton Dubois MD [phone]

## 2022-04-15 NOTE — Progress Notes (Signed)
Pt transported to CT and back with no events to report.

## 2022-04-15 NOTE — Progress Notes (Signed)
Patient extubated and tolerating 4 lpm nasal cannula well. Stopped all fentanyl, precedex, and due to heart rate being 49 stopped Amiodarone.

## 2022-04-15 NOTE — Procedures (Signed)
Extubation Procedure Note  Patient Details:   Name: CALEN GEISTER DOB: 1941-02-06 MRN: [id number]   Airway Documentation:  Airway 7.5 mm (Active)  Secured at (cm) 23 cm 04/15/22 1145  Measured From Lips 04/15/22 1145  Secured Location Left 04/15/22 1145  Secured By [employer] 04/15/22 1145  Tube Holder Repositioned Yes 04/15/22 1145  Prone position No 04/15/22 1145  Head position Right 04/15/22 1145  Cuff Pressure (cm H2O) Clear OR 27-39 CmH2O 04/15/22 1145  Site Condition Dry 04/15/22 1145   Vent end date: (not recorded) Vent end time: (not recorded)   Evaluation  O2 sats: stable throughout Complications: No apparent complications Patient did tolerate procedure well.    Yes  Lucianne Muss 04/15/2022, 1:01 PM

## 2022-04-15 NOTE — Progress Notes (Signed)
Removed patient off BiPAP after 22:00 blood gas. Patient stated he was fine. After about 5 minutes patients work of breathing had increased. At 10 minutes Patient became restless, accessory muscle work increased. Placed back on BiPAP. Patients saturation never dropped below 97 on 6 liters. RT witnessed entire episode.

## 2022-04-15 NOTE — Progress Notes (Signed)
Pt weaning on CP/PS 10/5 35%.  RT will evaluate for extubation after 30-45 mins and ABG results.

## 2022-04-15 NOTE — Progress Notes (Signed)
ANTICOAGULATION CONSULT NOTE -   Pharmacy Consult for heparin gtt  Indication: atrial fibrillation  Allergies  Allergen Reactions   Norvasc [Amlodipine] Itching   Parafon Forte Dsc [Chlorzoxazone] Itching    Patient Measurements: Height: '5\' 8"'$  (172.7 cm) Weight: 57.6 kg (126 lb 15.8 oz) IBW/kg (Calculated) : 68.4 Heparin Dosing Weight:     Vital Signs: Temp: 98.2 F (36.8 C) (10/15 0630) Temp Source: Bladder (10/15 0330) BP: 94/46 (10/15 0630) Pulse Rate: 49 (10/15 0630)  Labs: Recent Labs    04/13/22 0357 04/14/22 0211 04/14/22 0217 04/14/22 1932 04/15/22 0359 04/15/22 0757  HGB 12.4* 12.6*  --   --  12.3*  --   HCT 40.0 42.4  --   --  38.2*  --   PLT 395 403*  --   --  345  --   HEPARINUNFRC  --   --   --  0.10*  --  0.27*  CREATININE 0.52*  --  0.61  --  0.82  --   CKTOTAL  --   --   --   --  62  --      Estimated Creatinine Clearance: 56.6 mL/min (by C-G formula based on SCr of 0.82 mg/dL).   Medical History: Past Medical History:  Diagnosis Date   Anxiety    Cataracts, bilateral    removed by surgery   COPD (chronic obstructive pulmonary disease) ([place])    Depression, major, single episode, moderate ([place]) 12/18/2018   Glaucoma    both eyes   Glaucoma    bilateral   H. pylori infection 09/17/2019   S/p treatment with Prevpac. Documented eradiation via breath test on 11/03/19.    Hyperlipidemia    Hypertension    Pre-diabetes    diet controlled   Skin cancer, basal cell    arms, face   Wears dentures    full    Medications:  Medications Prior to Admission  Medication Sig Dispense Refill Last Dose   albuterol (PROVENTIL) (2.5 MG/3ML) 0.083% nebulizer solution Take 2.5 mg by nebulization every 6 (six) hours as needed for wheezing.   04/19/2022   albuterol (VENTOLIN HFA) 108 (90 Base) MCG/ACT inhaler Inhale 2 puffs into the lungs every 4 (four) hours as needed. 54 g 3 04/11/2022   ALPRAZolam (XANAX) 0.5 MG tablet TAKE 1 TABLET BY MOUTH EVERY DAY  AT BEDTIME AS NEEDED 30 tablet 5 04/11/2022   Ascorbic Acid (VITAMIN C PO) Take 1 tablet by mouth daily.   04/11/2022   aspirin EC 81 MG tablet Take 81 mg by mouth daily.   Past Week   budesonide-formoterol (SYMBICORT) 160-4.5 MCG/ACT inhaler INHALE TWO PUFFS BY MOUTH TWICE DAILY ** STOP SPIRIVA ** 1 each 6 04/19/2022   docusate sodium (COLACE) 100 MG capsule Take 100 mg by mouth 2 (two) times daily as needed for mild constipation.   04/24/2022   dorzolamide-timolol (COSOPT) 22.3-6.8 MG/ML ophthalmic solution Place 1 drop into both eyes 2 (two) times daily. 10 mL 0 04/05/2022   Fluticasone-Umeclidin-Vilant (TRELEGY ELLIPTA) 100-62.5-25 MCG/ACT AEPB Inhale 1 puff into the lungs daily. 60 each 0 04/11/2022   ipratropium-albuterol (DUONEB) 0.5-2.5 (3) MG/3ML SOLN USE 3 ML VIA NEBULIZER TWICE DAILY 180 mL 6 04/22/2022   mirtazapine (REMERON SOL-TAB) 30 MG disintegrating tablet Take 1 tablet (30 mg total) by mouth at bedtime. 30 tablet 5 04/11/2022   Multiple Vitamins-Minerals (IMMUNE SUPPORT PO) Take 1 tablet by mouth daily.   04/11/2022   OXYGEN Inhale 4 L into the lungs  continuous. 4lpm   04/10/2022   pravastatin (PRAVACHOL) 40 MG tablet Take 1 tablet (40 mg total) by mouth at bedtime. 90 tablet 1 04/11/2022   Saw Palmetto 450 MG CAPS Take 450 mg by mouth at bedtime.   04/11/2022   Scheduled:   arformoterol  15 mcg Nebulization BID   ascorbic acid  500 mg Oral Daily   aspirin EC  81 mg Oral Daily   budesonide (PULMICORT) nebulizer solution  0.5 mg Nebulization BID   Chlorhexidine Gluconate Cloth  6 each Topical Daily   dorzolamide-timolol  1 drop Both Eyes BID   guaiFENesin  600 mg Oral BID   ipratropium-albuterol  3 mL Nebulization Q6H   mirtazapine  30 mg Oral QHS   mouth rinse  15 mL Mouth Rinse Q2H   pravastatin  40 mg Oral QHS   sodium chloride flush  10-40 mL Intracatheter Q12H   Infusions:   sodium chloride 10 mL/hr at 04/15/22 0644   amiodarone 30 mg/hr (04/15/22 0644)   ceFEPime  (MAXIPIME) IV Stopped (04/14/22 2158)   dexmedetomidine (PRECEDEX) IV infusion 0.4 mcg/kg/hr (04/15/22 0644)   fentaNYL infusion INTRAVENOUS 100 mcg/hr (04/15/22 0644)   heparin 1,000 Units/hr (04/15/22 0644)   lactated ringers 125 mL/hr at 04/15/22 0644   norepinephrine (LEVOPHED) Adult infusion 3 mcg/min (04/15/22 0644)   vancomycin 500 mg (04/15/22 0814)   PRN: acetaminophen **OR** acetaminophen, ALPRAZolam, ondansetron **OR** ondansetron (ZOFRAN) IV, mouth rinse, sodium chloride flush Anti-infectives (From admission, onward)    Start     Dose/Rate Route Frequency Ordered Stop   04/14/22 2100  vancomycin (VANCOREADY) IVPB 500 mg/100 mL        500 mg 100 mL/hr over 60 Minutes Intravenous Every 12 hours 04/14/22 0929     04/14/22 1015  vancomycin (VANCOCIN) IVPB 1000 mg/200 mL premix        1,000 mg 200 mL/hr over 60 Minutes Intravenous  Once 04/14/22 0928 04/14/22 1116   04/14/22 1015  ceFEPIme (MAXIPIME) 2 g in sodium chloride 0.9 % 100 mL IVPB        2 g 200 mL/hr over 30 Minutes Intravenous Every 12 hours 04/14/22 0928     04/13/22 1600  azithromycin (ZITHROMAX) 500 mg in sodium chloride 0.9 % 250 mL IVPB  Status:  Discontinued        500 mg 250 mL/hr over 60 Minutes Intravenous Every 24 hours 04/13/2022 1700 04/14/22 0908   04/13/22 1000  cefTRIAXone (ROCEPHIN) 1 g in sodium chloride 0.9 % 100 mL IVPB  Status:  Discontinued        1 g 200 mL/hr over 30 Minutes Intravenous Every 24 hours 05/01/2022 1516 04/14/22 0908   04/15/2022 1315  cefTRIAXone (ROCEPHIN) 1 g in sodium chloride 0.9 % 100 mL IVPB        1 g 200 mL/hr over 30 Minutes Intravenous  Once 04/01/2022 1312 04/23/2022 1437   04/07/2022 1315  azithromycin (ZITHROMAX) 500 mg in sodium chloride 0.9 % 250 mL IVPB        500 mg 250 mL/hr over 60 Minutes Intravenous  Once 04/07/2022 1312 04/29/2022 1654       Assessment: George Meyer a 81 y.o. male requires anticoagulation with a heparin iv infusion for the indication of new  onset atrial fibrillation. Heparin gtt will be started following pharmacy protocol per pharmacy consult. Patient is not on previous oral anticoagulant.  HL is 0.27, just slightly subtherapeutic  Goal of Therapy:  Heparin level 0.3-0.7 units/ml Monitor  platelets by anticoagulation protocol: Yes   Plan:  Give 500 units bolus x 1 Increase heparin infusion to 1100 units/hr Check anti-Xa level in 8 hours and daily while on heparin Continue to monitor H&H and platelets  Isac Sarna, BS Pharm D, BCPS Clinical Pharmacist 04/15/2022,8:51 AM

## 2022-04-15 NOTE — Progress Notes (Addendum)
Discussed with Dr. Josph Macho, pt uop is minimal, pt was started on LR at 127m/hr with only 729min urine. (No uop was charted for previous shift) LR has been increased to 25089mr. Pt is swollen in BUE and has a wt gain of ~5kg, will see what BUN/creat and CK are, (potential need for Lasix or AKI since fall). Pt remains very pleasant and follows commands and is looking forward to being extubated soon!!

## 2022-04-15 NOTE — Progress Notes (Signed)
Progress Note   Patient: George Meyer KNL:[id number] DOB: 1941/04/21 DOA: 04/06/2022     3 DOS: the patient was seen and examined on 04/15/2022   Brief hospital course: George Meyer is a 81 y.o. male with medical history significant of anxiety/depression, hypertension, hyperlipidemia, glaucoma, history of COPD, gastroesophageal reflux disease and prior history of H. Pylori; who presented to the hospital secondary to pleuritic chest discomfort, intermittent coughing spells and shortness of breath.  Patient reports no fever; but experiencing chills.  Coughing spells have become productive of yellowish..  No hemoptysis.  Presented to PCP office with above concerns and was referred to the emergency department for further evaluation and management.   Patient denies palpitations, nausea, vomiting, dysuria/hematuria, melena, hematochezia, focal weaknesses or any other complaints.   Work-up in the ED demonstrated elevated D-dimer with subsequent CT scan of the chest ruling out pulmonary embolism and confirmation as seen on chest x-ray 8 of what appears to be multilobar pneumonia.  Patient met sepsis criteria on admission with tachycardia, tachypnea, elevated WBCs and elevated lactic acid.  Cultures taken, IV antibiotics and fluid resuscitation initiated and TRH contacted to place patient in the hospital for further evaluation and management.  Assessment and Plan: * Sepsis due to pneumonia [facility] [place]) Septic shock due to community acquired pneumonia (presetn on admission).  Lactic acidosis  Patient continue with hypotension, on norepinephrine infusion (peripheral 7 mcg per min). Continue atrial fibrillation with RVR 150's.  Wbc 16,8  Cultures with no growth.   Plan: Patient needs better rate control atrial fibrillation to improve his hemodynamics.  Start patient on amiodarone drip Add 1000 ml bolus NS.  Change antibiotic therapy to Vancomycin and Cefepime. PIC line requested   Acute  respiratory failure, hypercapnia and hypoxemia, Ventilator PRVC Peep 5 and Fi02 50% with no dyssynchrony  Currently off sedation with good toleration.  Plan to keep on invasive mechanical ventilation until patient more hemodynamically stable.    Atrial fibrillation with RVR ([place]) Patient continue with atrial fibrillation with RVR Currently hypotensive  Plan for rate control with amiodarone.  Chads Vasc score 2  Add anticoagulation with heparin  Echocardiogram after better rate control.   HTN (hypertension) Patient hypotensive.  On hold all antihypertensive medications.   Centrilobular emphysema (HCC) Respiratory failure has improved with mechanical ventilation Follow up blood gas with pH 7,51, Pc02 52, Pa02 328 bicarbonate 42.  Continue bronchodilatory therapy Hold on systemic steroids for now When hemodynamically more stable attempt to liberate from mechanical ventilation   Hypernatremia Renal function with serum cr at 0,61, K is 4,1 and serum bicarbonate at 40. Mg is 2,1  Patient currently NPO Continue volume resuscitation with isotonic saline.  Follow up renal function and electrolytes in am.   Hyperlipidemia Continue with pravastatin   Glaucoma -Affecting both eyes -Continue outpatient follow-up with PCP/ophthalmologist -Continue the use of Cosopt eyedrops.  GERD (gastroesophageal reflux disease) -Continue PPI.  Generalized anxiety disorder Continue with mirtazapine and alprazolam.   Pulmonary nodules Worsening pulmonary nodules, with mediastinal lymph nodes along with sclerotic lesion in T 12.  High pre test probability for malignancy.  Will need follow up once patient more stable.    Subjective:  No fever, intubated and mechanically ventilated; following commands appropriately and demonstrating stable respiratory effort.  Good ABGs consult for acute SBT process.  Physical Exam: Vitals:   04/15/22 1000 04/15/22 1145 04/15/22 1400 04/15/22 1500  BP:  (!) 102/51   (!) 102/51  Pulse: (!) 47   84  Resp: 18   18  Temp: 98.2 F (36.8 C)     TempSrc:      SpO2: 100% 100% 98% 98%  Weight:      Height:       General exam: Alert, awake, oriented and following commands appropriately; patient is intubated demonstrating good respiratory effort and having SBT's. Respiratory system: Improved air movement bilaterally; positive rhonchi, no wheezing or crackles. Cardiovascular system: Irregular, bradycardic, no rubs, no gallops, no JVD. Gastrointestinal system: Abdomen is nondistended, soft and nontender. No organomegaly or masses felt. Normal bowel sounds heard. Central nervous system: No focal neurological deficits. Extremities: No cyanosis, clubbing or edema. Skin: No petechiae; multiple bruises on his upper extremity appreciated. Psychiatry: Mood & affect appropriate.   Data Reviewed: Basic metabolic panel sodium 275, potassium 3.3, chloride 102, bicarb 30, BUN 31, creatinine 0.82 CBC: WBCs 21.6, hemoglobin 12.3, platelet count 345 K ABG: pH 7.46, CO2 48, O2 72, bicarb 34.1  Family Communication: Daughter at bedside.  Disposition: Status is: Inpatient Remains inpatient appropriate because: Continue IV antibiotics for sepsis due to pneumonia.   Planned Discharge Destination: Home with plans to move to [place] to be with family.   Author: Barton Dubois, MD 04/15/2022 4:02 PM  For on call review [email or web address].

## 2022-04-15 NOTE — Progress Notes (Signed)
ANTICOAGULATION CONSULT NOTE -   Pharmacy Consult for heparin gtt  Indication: atrial fibrillation  Allergies  Allergen Reactions   Norvasc [Amlodipine] Itching   Parafon Forte Dsc [Chlorzoxazone] Itching    Patient Measurements: Height: '5\' 8"'$  (172.7 cm) Weight: 57.6 kg (126 lb 15.8 oz) IBW/kg (Calculated) : 68.4 Heparin Dosing Weight:     Vital Signs: Temp: 97.6 F (36.4 C) (10/15 1626) Temp Source: Axillary (10/15 1626) BP: 93/51 (10/15 1800) Pulse Rate: 55 (10/15 1800)  Labs: Recent Labs    04/13/22 0357 04/14/22 0211 04/14/22 0217 04/14/22 1932 04/15/22 0359 04/15/22 0757 04/15/22 1803  HGB 12.4* 12.6*  --   --  12.3*  --   --   HCT 40.0 42.4  --   --  38.2*  --   --   PLT 395 403*  --   --  345  --   --   HEPARINUNFRC  --   --   --  0.10*  --  0.27* 0.37  CREATININE 0.52*  --  0.61  --  0.82  --   --   CKTOTAL  --   --   --   --  62  --   --      Estimated Creatinine Clearance: 56.6 mL/min (by C-G formula based on SCr of 0.82 mg/dL).   Medical History: Past Medical History:  Diagnosis Date   Anxiety    Cataracts, bilateral    removed by surgery   COPD (chronic obstructive pulmonary disease) ([place])    Depression, major, single episode, moderate ([place]) 12/18/2018   Glaucoma    both eyes   Glaucoma    bilateral   H. pylori infection 09/17/2019   S/p treatment with Prevpac. Documented eradiation via breath test on 11/03/19.    Hyperlipidemia    Hypertension    Pre-diabetes    diet controlled   Skin cancer, basal cell    arms, face   Wears dentures    full    Medications:  Medications Prior to Admission  Medication Sig Dispense Refill Last Dose   albuterol (PROVENTIL) (2.5 MG/3ML) 0.083% nebulizer solution Take 2.5 mg by nebulization every 6 (six) hours as needed for wheezing.   04/22/2022   albuterol (VENTOLIN HFA) 108 (90 Base) MCG/ACT inhaler Inhale 2 puffs into the lungs every 4 (four) hours as needed. 54 g 3 04/11/2022   ALPRAZolam (XANAX)  0.5 MG tablet TAKE 1 TABLET BY MOUTH EVERY DAY AT BEDTIME AS NEEDED 30 tablet 5 04/11/2022   Ascorbic Acid (VITAMIN C PO) Take 1 tablet by mouth daily.   04/11/2022   aspirin EC 81 MG tablet Take 81 mg by mouth daily.   Past Week   budesonide-formoterol (SYMBICORT) 160-4.5 MCG/ACT inhaler INHALE TWO PUFFS BY MOUTH TWICE DAILY ** STOP SPIRIVA ** 1 each 6 04/05/2022   docusate sodium (COLACE) 100 MG capsule Take 100 mg by mouth 2 (two) times daily as needed for mild constipation.   04/20/2022   dorzolamide-timolol (COSOPT) 22.3-6.8 MG/ML ophthalmic solution Place 1 drop into both eyes 2 (two) times daily. 10 mL 0 04/30/2022   Fluticasone-Umeclidin-Vilant (TRELEGY ELLIPTA) 100-62.5-25 MCG/ACT AEPB Inhale 1 puff into the lungs daily. 60 each 0 04/11/2022   ipratropium-albuterol (DUONEB) 0.5-2.5 (3) MG/3ML SOLN USE 3 ML VIA NEBULIZER TWICE DAILY 180 mL 6 04/01/2022   mirtazapine (REMERON SOL-TAB) 30 MG disintegrating tablet Take 1 tablet (30 mg total) by mouth at bedtime. 30 tablet 5 04/11/2022   Multiple Vitamins-Minerals (IMMUNE SUPPORT PO)  Take 1 tablet by mouth daily.   04/11/2022   OXYGEN Inhale 4 L into the lungs continuous. 4lpm   04/26/2022   pravastatin (PRAVACHOL) 40 MG tablet Take 1 tablet (40 mg total) by mouth at bedtime. 90 tablet 1 04/11/2022   Saw Palmetto 450 MG CAPS Take 450 mg by mouth at bedtime.   04/11/2022   Scheduled:   arformoterol  15 mcg Nebulization BID   ascorbic acid  500 mg Oral Daily   aspirin EC  81 mg Oral Daily   budesonide (PULMICORT) nebulizer solution  0.5 mg Nebulization BID   Chlorhexidine Gluconate Cloth  6 each Topical Daily   dorzolamide-timolol  1 drop Both Eyes BID   guaiFENesin  600 mg Oral BID   ipratropium-albuterol  3 mL Nebulization Q6H   methylPREDNISolone (SOLU-MEDROL) injection  40 mg Intravenous Q12H   mirtazapine  30 mg Oral QHS   mouth rinse  15 mL Mouth Rinse Q2H   pravastatin  40 mg Oral QHS   sodium chloride flush  10-40 mL  Intracatheter Q12H   Infusions:   sodium chloride 10 mL/hr at 04/15/22 0644   amiodarone Stopped (04/15/22 1306)   ceFEPime (MAXIPIME) IV Stopped (04/15/22 1100)   dexmedetomidine (PRECEDEX) IV infusion 0.4 mcg/kg/hr (04/15/22 1849)   fentaNYL infusion INTRAVENOUS Stopped (04/15/22 1307)   heparin 1,100 Units/hr (04/15/22 0950)   norepinephrine (LEVOPHED) Adult infusion 3 mcg/min (04/15/22 1847)   potassium chloride 10 mEq (04/15/22 1919)   vancomycin 500 mg (04/15/22 0814)   PRN: acetaminophen **OR** acetaminophen, ALPRAZolam, ondansetron **OR** ondansetron (ZOFRAN) IV, mouth rinse, sodium chloride flush Anti-infectives (From admission, onward)    Start     Dose/Rate Route Frequency Ordered Stop   04/14/22 2100  vancomycin (VANCOREADY) IVPB 500 mg/100 mL        500 mg 100 mL/hr over 60 Minutes Intravenous Every 12 hours 04/14/22 0929     04/14/22 1015  vancomycin (VANCOCIN) IVPB 1000 mg/200 mL premix        1,000 mg 200 mL/hr over 60 Minutes Intravenous  Once 04/14/22 0928 04/14/22 1116   04/14/22 1015  ceFEPIme (MAXIPIME) 2 g in sodium chloride 0.9 % 100 mL IVPB        2 g 200 mL/hr over 30 Minutes Intravenous Every 12 hours 04/14/22 0928     04/13/22 1600  azithromycin (ZITHROMAX) 500 mg in sodium chloride 0.9 % 250 mL IVPB  Status:  Discontinued        500 mg 250 mL/hr over 60 Minutes Intravenous Every 24 hours 04/11/2022 1700 04/14/22 0908   04/13/22 1000  cefTRIAXone (ROCEPHIN) 1 g in sodium chloride 0.9 % 100 mL IVPB  Status:  Discontinued        1 g 200 mL/hr over 30 Minutes Intravenous Every 24 hours 04/30/2022 1516 04/14/22 0908   04/11/2022 1315  cefTRIAXone (ROCEPHIN) 1 g in sodium chloride 0.9 % 100 mL IVPB        1 g 200 mL/hr over 30 Minutes Intravenous  Once 04/16/2022 1312 04/19/2022 1437   04/19/2022 1315  azithromycin (ZITHROMAX) 500 mg in sodium chloride 0.9 % 250 mL IVPB        500 mg 250 mL/hr over 60 Minutes Intravenous  Once 04/01/2022 1312 04/03/2022 1654        Assessment: George Meyer a 81 y.o. male requires anticoagulation with a heparin iv infusion for the indication of new onset atrial fibrillation. Heparin gtt will be started following pharmacy protocol per pharmacy  consult. Patient is not on previous oral anticoagulant.  HL is 0.37, therapeutic  Goal of Therapy:  Heparin level 0.3-0.7 units/ml Monitor platelets by anticoagulation protocol: Yes   Plan:  Continue heparin infusion at 1100 units/hr Check anti-Xa level in 8 hours and daily while on heparin Continue to monitor H&H and platelets  Isac Sarna, BS Pharm D, BCPS Clinical Pharmacist 04/15/2022,7:41 PM

## 2022-04-16 ENCOUNTER — Inpatient Hospital Stay (HOSPITAL_COMMUNITY): Payer: PPO

## 2022-04-16 DIAGNOSIS — A419 Sepsis, unspecified organism: Secondary | ICD-10-CM | POA: Diagnosis not present

## 2022-04-16 DIAGNOSIS — J9602 Acute respiratory failure with hypercapnia: Secondary | ICD-10-CM

## 2022-04-16 DIAGNOSIS — I4891 Unspecified atrial fibrillation: Secondary | ICD-10-CM | POA: Diagnosis not present

## 2022-04-16 DIAGNOSIS — J432 Centrilobular emphysema: Secondary | ICD-10-CM | POA: Diagnosis not present

## 2022-04-16 DIAGNOSIS — F411 Generalized anxiety disorder: Secondary | ICD-10-CM | POA: Diagnosis not present

## 2022-04-16 DIAGNOSIS — J9601 Acute respiratory failure with hypoxia: Secondary | ICD-10-CM | POA: Diagnosis not present

## 2022-04-16 DIAGNOSIS — J189 Pneumonia, unspecified organism: Secondary | ICD-10-CM | POA: Diagnosis not present

## 2022-04-16 LAB — BASIC METABOLIC PANEL
Anion gap: 12 (ref 5–15)
BUN: 34 mg/dL — ABNORMAL HIGH (ref 8–23)
CO2: 30 mmol/L (ref 22–32)
Calcium: 8.3 mg/dL — ABNORMAL LOW (ref 8.9–10.3)
Chloride: 100 mmol/L (ref 98–111)
Creatinine, Ser: 0.9 mg/dL (ref 0.61–1.24)
GFR, Estimated: >60 mL/min (ref >60)
Glucose, Bld: 148 mg/dL — ABNORMAL HIGH (ref 70–99)
Potassium: 4.3 mmol/L (ref 3.5–5.1)
Sodium: 142 mmol/L (ref 135–145)

## 2022-04-16 LAB — GLUCOSE, CAPILLARY
Glucose-Capillary: 112 mg/dL — ABNORMAL HIGH (ref 70–99)
Glucose-Capillary: 114 mg/dL — ABNORMAL HIGH (ref 70–99)
Glucose-Capillary: 129 mg/dL — ABNORMAL HIGH (ref 70–99)
Glucose-Capillary: 133 mg/dL — ABNORMAL HIGH (ref 70–99)
Glucose-Capillary: 139 mg/dL — ABNORMAL HIGH (ref 70–99)
Glucose-Capillary: 139 mg/dL — ABNORMAL HIGH (ref 70–99)
Glucose-Capillary: 139 mg/dL — ABNORMAL HIGH (ref 70–99)

## 2022-04-16 LAB — BLOOD GAS, VENOUS
Acid-Base Excess: 3.4 mmol/L — ABNORMAL HIGH (ref 0.0–2.0)
Bicarbonate: 29.6 mmol/L — ABNORMAL HIGH (ref 20.0–28.0)
Drawn by: 1517
O2 Saturation: 87.2 %
Patient temperature: 37
pCO2, Ven: 50 mmHg (ref 44–60)
pH, Ven: 7.38 (ref 7.25–7.43)
pO2, Ven: 56 mmHg — ABNORMAL HIGH (ref 32–45)

## 2022-04-16 LAB — CBC
HCT: 36.7 % — ABNORMAL LOW (ref 39.0–52.0)
Hemoglobin: 11.7 g/dL — ABNORMAL LOW (ref 13.0–17.0)
MCH: 31.9 pg (ref 26.0–34.0)
MCHC: 31.9 g/dL (ref 30.0–36.0)
MCV: 100 fL (ref 80.0–100.0)
Platelets: 337 10*3/uL (ref 150–400)
RBC: 3.67 MIL/uL — ABNORMAL LOW (ref 4.22–5.81)
RDW: 16.7 % — ABNORMAL HIGH (ref 11.5–15.5)
WBC: 22.7 10*3/uL — ABNORMAL HIGH (ref 4.0–10.5)
nRBC: 0 % (ref 0.0–0.2)

## 2022-04-16 LAB — MRSA NEXT GEN BY PCR, NASAL: MRSA by PCR Next Gen: NOT DETECTED

## 2022-04-16 LAB — HEPARIN LEVEL (UNFRACTIONATED): Heparin Unfractionated: 0.34 IU/mL (ref 0.30–0.70)

## 2022-04-16 NOTE — Consult Note (Signed)
NAME:  George Meyer, MRN:  [id number], DOB:  1940/09/23, LOS: 4 ADMISSION DATE:  04/06/2022, CONSULTATION DATE:  04/16/2022  REFERRING MD:  Dyann Kief, TRH, CHIEF COMPLAINT:  resp failure    History of Present Illness:  81 year old male with severe COPD and chronic respiratory failure on 2 L of oxygen admitted 10/12 with chills, yellow sputum Labs showed leukocytosis 16.8 CT angiogram chest showed consolidation in left upper and lower lobe with interlobular septal thickening, moderate left effusion, 2 pulmonary nodules noted in right upper lobe 1.6 x 1.4 cm and 9 x 7 mm.  There was a large mediastinal lymph nodes.  There was new sclerotic lesion at T2 and a stable lesion at T3 He developed respiratory arrest on 10/14 requiring emergent intubation, ABG showed acute hypercarbic respiratory failure, per tensive requiring Levophed. He was extubated 10/15 and required BiPAP. PCCM consulted 10/16  Pertinent  Medical History  Anxiety/depression Hypertension GE reflux disease with H. pylori  Significant Hospital Events: Including procedures, antibiotic start and stop dates in addition to other pertinent events   ETT 10/14 to 10/15  Interim History / Subjective:  Critically ill, on low-dose Precedex and Levophed. On IV heparin. Daughter at bedside  Objective   Blood pressure (!) 148/63, pulse 84, temperature (!) 96.7 F (35.9 C), temperature source Axillary, resp. rate 20, height '5\' 8"'$  (1.727 m), weight 62.6 kg, SpO2 95 %.    Vent Mode: BIPAP FiO2 (%):  [35 %-45 %] 40 % Set Rate:  [22 bmp] 22 bmp PEEP:  [5 cmH20] 5 cmH20   Intake/Output Summary (Last 24 hours) at 04/16/2022 1534 Last data filed at 04/16/2022 1243 Gross per 24 hour  Intake 1370.53 ml  Output 450 ml  Net 920.53 ml   Filed Weights   04/14/22 0500 04/15/22 0330 04/16/22 0430  Weight: 52.5 kg 57.6 kg 62.6 kg    Examination: General: Elderly man, mild distress, appears fidgety HENT: Mild pallor, no icterus, no  JVD Lungs: Decreased breath sounds bilateral, hoarse voice, no accessory muscle use Cardiovascular: S1-S2 regular, no murmur Abdomen: Soft, nontender abdomen, no hepatosplenomegaly Extremities: Ecchymosis, no deformity, Neuro: Alert, awake, follows one-step commands, mild tremors, hoarse voice   Chest x-ray dependently reviewed shows left lower lobe airspace disease and effusion  Resolved Hospital Problem list     Assessment & Plan:  Acute hypoxic and hypercarbic respiratory failure Left lower lobe community-acquired pneumonia Left parapneumonic effusion  -Differential includes lung malignancy, it is possible that the entire presentation is due to advanced lung malignancy ovulating in the left lower lobe and metastatic nodules in the right lung and T2.  There is also circumferential esophageal thickening which may need further investigation  -Wean oxygen, use BiPAP as needed -Continue empiric cefepime vancomycin -Swallow evaluation once improved. -Proceed with thoracentesis on left tomorrow, will need to discontinue heparin prior.  This will help differentiate parapneumonic from malignant effusion  Severe centrilobular emphysema -Budesonide/Brovana and Yupelri regimen -No bronchospasm so okay to hold off on steroids  Atrial fibrillation with RVR -IV heparin per primary service , can be discontinued now that he is back to sinus rhythm.  Severe protein calorie malnutrition -consider tube feeds if his voice does not improve by tomorrow  Best Practice (right click and "Reselect all SmartList Selections" daily)   Diet/type: NPO DVT prophylaxis: systemic heparin GI prophylaxis: N/A Lines: N/A Foley:  N/A Code Status:  full code Last date of multidisciplinary goals of care discussion [updated daughter at bedside]  Labs   CBC:  Recent Labs  Lab 04/18/2022 1228 04/19/2022 1700 04/13/22 0357 04/14/22 0211 04/15/22 0359 04/16/22 0423  WBC 14.0* 13.6* 15.4* 16.8* 21.6* 22.7*   NEUTROABS 10.8*  --   --   --  17.6*  --   HGB 14.3 14.3 12.4* 12.6* 12.3* 11.7*  HCT 46.5 45.8 40.0 42.4 38.2* 36.7*  MCV 102.6* 104.3* 102.8* 105.2* 99.2 100.0  PLT 380 420* 395 403* 345 976    Basic Metabolic Panel: Recent Labs  Lab 04/07/2022 1228 04/13/22 0357 04/14/22 0217 04/14/22 0219 04/15/22 0359 04/16/22 0423  NA 146* 148* 145  --  142 142  K 4.5 3.7 4.1  --  3.3* 4.3  CL 98 101 101  --  102 100  CO2 39* 38* 40*  --  30 30  GLUCOSE 111* 98 201*  --  124* 148*  BUN '18 17 23  '$ --  31* 34*  CREATININE 0.63 0.52* 0.61  --  0.82 0.90  CALCIUM 9.4 9.0 8.5*  --  8.4* 8.3*  MG 2.1 1.8  --  2.1  --   --   PHOS  --  3.8  --   --   --   --    GFR: Estimated Creatinine Clearance: 56 mL/min (by C-G formula based on SCr of 0.9 mg/dL). Recent Labs  Lab 04/18/2022 1343 04/21/2022 1617 04/04/2022 1700 04/13/22 0357 04/14/22 0211 04/15/22 0359 04/16/22 0423  WBC  --   --    < > 15.4* 16.8* 21.6* 22.7*  LATICACIDVEN 2.3* 2.5*  --  1.6  --   --   --    < > = values in this interval not displayed.    Liver Function Tests: Recent Labs  Lab 04/08/2022 1228  AST 21  ALT 10  ALKPHOS 78  BILITOT 0.8  PROT 6.5  ALBUMIN 3.6   No results for input(s): "LIPASE", "AMYLASE" in the last 168 hours. No results for input(s): "AMMONIA" in the last 168 hours.  ABG    Component Value Date/Time   PHART 7.39 04/15/2022 2140   PCO2ART 54 (H) 04/15/2022 2140   PO2ART 57 (L) 04/15/2022 2140   HCO3 29.6 (H) 04/16/2022 0423   TCO2 21.8 04/15/2009 0935   O2SAT 87.2 04/16/2022 0423     Coagulation Profile: No results for input(s): "INR", "PROTIME" in the last 168 hours.  Cardiac Enzymes: Recent Labs  Lab 04/15/22 0359  CKTOTAL 62    HbA1C: Hgb A1c MFr Bld  Date/Time Value Ref Range Status  11/09/2021 09:04 AM 6.0 (H) 4.8 - 5.6 % Final    Comment:             Prediabetes: 5.7 - 6.4          Diabetes: >6.4          Glycemic control for adults with diabetes: <7.0      CBG: Recent Labs  Lab 04/16/22 0004 04/16/22 0017 04/16/22 0444 04/16/22 0715 04/16/22 1140  GLUCAP 129* 114* 139* 139* 112*    Review of Systems:   Unable to obtain due to critical illness  Past Medical History:  He,  has a past medical history of Anxiety, Cataracts, bilateral, COPD (chronic obstructive pulmonary disease) ([place]), Depression, major, single episode, moderate ([place]) (12/18/2018), Glaucoma, Glaucoma, H. pylori infection (09/17/2019), Hyperlipidemia, Hypertension, Pre-diabetes, Skin cancer, basal cell, and Wears dentures.   Surgical History:   Past Surgical History:  Procedure Laterality Date   BIOPSY  09/17/2019   Procedure: BIOPSY;  Surgeon: Manus Rudd  M, MD;  [facility];  Service: Endoscopy;;  gastric   CARDIAC CATHETERIZATION N/A 12/2005   negative   COLONOSCOPY N/A 12/2009   small hyperplastic polyp repeat in 10 years   COLONOSCOPY WITH PROPOFOL N/A 12/13/2020   Procedure: COLONOSCOPY WITH PROPOFOL;  Surgeon: Eloise Harman, DO;  [facility];  Service: Endoscopy;  Laterality: N/A;  2:45pm   DUPUYTREN CONTRACTURE RELEASE Left 02/02/2019   Procedure: EXCISION LEFT HAND AND SMALL FINGER DUPUYTREN CONTRACTURE RELEASE;  Surgeon: Marybelle Killings, MD;  Location: [facility];  Service: Orthopedics;  Laterality: Left;   ESOPHAGOGASTRODUODENOSCOPY (EGD) WITH PROPOFOL N/A 09/17/2019   Procedure: ESOPHAGOGASTRODUODENOSCOPY (EGD) WITH PROPOFOL;  Surgeon: Daneil Dolin, MD; esophageal labs/Schatzki's ring noncritical, not manipulated.  Small hiatal hernia.  Inflamed stomach with biopsies revealing chronic active gastritis with H. pylori.  H. pylori treated with Prevpac.   EYE SURGERY Bilateral    remove cataracts   HAND SURGERY Right 2010   dupuytrens excision right little finger   POLYPECTOMY  12/13/2020   Procedure: POLYPECTOMY;  Surgeon: Eloise Harman, DO;  [facility];  Service: Endoscopy;;   TONSILLECTOMY     WISDOM TOOTH  EXTRACTION       Social History:   reports that he quit smoking about 17 months ago. His smoking use included cigarettes. He has a 66.00 pack-year smoking history. He has never used smokeless tobacco. He reports that he does not drink alcohol and does not use drugs.   Family History:  His family history includes Heart attack in his father and mother; Lung cancer in his brother. There is no history of Colon cancer.   Allergies Allergies  Allergen Reactions   Norvasc [Amlodipine] Itching   Parafon Forte Dsc [Chlorzoxazone] Itching     Home Medications  Prior to Admission medications   Medication Sig Start Date End Date Taking? Authorizing Provider  albuterol (PROVENTIL) (2.5 MG/3ML) 0.083% nebulizer solution Take 2.5 mg by nebulization every 6 (six) hours as needed for wheezing.   Yes [provider]  albuterol (VENTOLIN HFA) 108 (90 Base) MCG/ACT inhaler Inhale 2 puffs into the lungs every 4 (four) hours as needed. 02/13/22  Yes Kathyrn Drown, MD  ALPRAZolam Duanne Moron) 0.5 MG tablet TAKE 1 TABLET BY MOUTH EVERY DAY AT BEDTIME AS NEEDED 02/13/22  Yes Luking, Elayne Snare, MD  Ascorbic Acid (VITAMIN C PO) Take 1 tablet by mouth daily.   Yes [provider]  aspirin EC 81 MG tablet Take 81 mg by mouth daily.   Yes [provider]  budesonide-formoterol (SYMBICORT) 160-4.5 MCG/ACT inhaler INHALE TWO PUFFS BY MOUTH TWICE DAILY ** STOP SPIRIVA ** 02/13/22  Yes Luking, Scott A, MD  docusate sodium (COLACE) 100 MG capsule Take 100 mg by mouth 2 (two) times daily as needed for mild constipation.   Yes [provider]  dorzolamide-timolol (COSOPT) 22.3-6.8 MG/ML ophthalmic solution Place 1 drop into both eyes 2 (two) times daily. 06/02/19  Yes Luking, Elayne Snare, MD  Fluticasone-Umeclidin-Vilant (TRELEGY ELLIPTA) 100-62.5-25 MCG/ACT AEPB Inhale 1 puff into the lungs daily. 03/22/22  Yes Rigoberto Noel, MD  ipratropium-albuterol (DUONEB) 0.5-2.5 (3) MG/3ML SOLN USE 3 ML VIA  NEBULIZER TWICE DAILY 02/27/22  Yes Rigoberto Noel, MD  mirtazapine (REMERON SOL-TAB) 30 MG disintegrating tablet Take 1 tablet (30 mg total) by mouth at bedtime. 02/13/22  Yes Luking, Elayne Snare, MD  Multiple Vitamins-Minerals (IMMUNE SUPPORT PO) Take 1 tablet by mouth daily.   Yes  [provider]  OXYGEN Inhale 4 L into the lungs continuous. 4lpm   Yes [provider]  pravastatin (PRAVACHOL) 40 MG tablet Take 1 tablet (40 mg total) by mouth at bedtime. 02/13/22  Yes Luking, Elayne Snare, MD  Saw Palmetto 450 MG CAPS Take 450 mg by mouth at bedtime.   Yes [provider]     Critical care time: Lad[place] MD. FCCP. [facility] Pager : [phone]  If no response to pager , please call [phone] until 7 pm After 7:00 pm call Elink  [phone]   04/16/2022

## 2022-04-16 NOTE — Progress Notes (Signed)
Progress Note   Patient: George Meyer PQZ:[id number] DOB: [birth year] DOA: 04/25/2022     4 DOS: the patient was seen and examined on 04/16/2022   Brief hospital course: MEREK NIU is a 81 y.o. male with medical history significant of anxiety/depression, hypertension, hyperlipidemia, glaucoma, history of COPD, gastroesophageal reflux disease and prior history of H. Pylori; who presented to the hospital secondary to pleuritic chest discomfort, intermittent coughing spells and shortness of breath.  Patient reports no fever; but experiencing chills.  Coughing spells have become productive of yellowish..  No hemoptysis.  Presented to PCP office with above concerns and was referred to the emergency department for further evaluation and management.   Patient denies palpitations, nausea, vomiting, dysuria/hematuria, melena, hematochezia, focal weaknesses or any other complaints.   Work-up in the ED demonstrated elevated D-dimer with subsequent CT scan of the chest ruling out pulmonary embolism and confirmation as seen on chest x-ray 8 of what appears to be multilobar pneumonia.  Patient met sepsis criteria on admission with tachycardia, tachypnea, elevated WBCs and elevated lactic acid.  Cultures taken, IV antibiotics and fluid resuscitation initiated and TRH contacted to place patient in the hospital for further evaluation and management.  Assessment and Plan: * Sepsis due to pneumonia [facility]) -Septic shock due to community acquired pneumonia (presetn on admission).  -Lactic acidosis -Patient has improved hemodynamically; currently off amiodarone and norepinephrine.   -Sinus rhythm and stable blood pressure. -Blood culture without growth; no fever and now having elevated WBCs secondary to steroids. -Continue current IV antibiotics -Continue supportive care -close monitoring of his respiratory status; patient high risk for requiring reintubation.      Atrial fibrillation with RVR  (HCC) -No longer hypotensive -Demonstrating bradycardia with irregular beating -Patient with a past history of A-fib; anticipate worsening secondary to sepsis and pneumonia. -Discontinued amiodarone and in the setting of hematuria will stop heparin drip. -Follow heart rate and response. -Chads Vasc score 2; was on aspirin only prior to admission. -planning t pursuit echocardiogram tomorrow.  HTN (hypertension) -Continue holding oral hypertensive agents -Follow-up vital signs.  Centrilobular emphysema ([place]) -Respiratory failure improved with mechanical ventilation -Successfully extubated on 04/15/2022; with transient desaturation and hypercapnic process that was able to be fixed with BiPAP usage. -Remains a high risk for requiring intubation with weak cough and overall frail stability to protect airways. -Requiring 5 L second supplementation currently. -Continue steroids and bronchodilator management -Pulmonology service consulted.  Hypernatremia -Improved after fluid resuscitation given -Continue to follow electrolytes trend. -Patient currently n.p.o; will assess bedside swallow when capability. -If needed will get speech therapy consultation.  Hyperlipidemia -Continue with pravastatin   Glaucoma -Affecting both eyes -Continue outpatient follow-up with PCP/ophthalmologist -Continue the use of Cosopt eyedrops.  GERD (gastroesophageal reflux disease) -Continue PPI.  Generalized anxiety disorder -Continue with mirtazapine and alprazolam.   Pulmonary nodules -Worsening pulmonary nodules, with mediastinal lymph nodes -along with sclerotic lesion in T 12.  -High pre test probability for malignancy.  -After discussion with pulmonology service will attempt ultrasound thoracentesis of appreciated pleural effusion on his chest images. -Continue current antibiotic therapy.   Subjective:  Afebrile, no chest pain, no nausea, no vomiting.  Extubated and currently demonstrating good  saturation on 5 L.  Physical Exam: Vitals:   04/16/22 1200 04/16/22 1300 04/16/22 1333 04/16/22 1400  BP: 95/65 (!) 116/52  (!) 148/63  Pulse: 64 71  84  Resp: 16 (!) 22  20  Temp:      TempSrc:  SpO2: 96% 95% 92% 95%  Weight:      Height:       General exam: Alert, awake and following commands appropriately; chronically ill in appearance, weak and very hoarse demonstrating poor/weak cough. Respiratory system: No wheezing appreciated currently; positive rhonchi bilaterally; no using accessory muscle. Cardiovascular system: Rate controlled, sinus rhythm; no rubs, no gallops, no JVD. Gastrointestinal system: Abdomen is nondistended, soft and nontender. No organomegaly or masses felt. Normal bowel sounds heard. Central nervous system: Alert and oriented. No focal neurological deficits. Extremities: No gnosis or clubbing. Skin: No petechiae. Psychiatry: Judgement and insight appear normal. Mood & affect appropriate.   Data Reviewed: Basic metabolic panel sodium 014, potassium 3.3, chloride 102, bicarb 30, BUN 31, creatinine 0.82 CBC: WBCs 22.7, hemoglobin 11.7 and platelets count 337 K. ABG: pH 7.39, PCO2 54, PO2 57, bicarb 32.7  Family Communication: Daughter at bedside.  Disposition: Status is: Inpatient Remains inpatient appropriate because: Continue IV antibiotics for sepsis due to pneumonia.   Planned Discharge Destination: Home with plans to move to [place] to be with family.   Author: Barton Dubois, MD 04/16/2022 3:30 PM  For on call review [email or web address].

## 2022-04-16 NOTE — Progress Notes (Signed)
Received call from RN.  Pt keeps pulling off [place] and wanted patient to go back on Bipap to see if he would keep the mask on.  Placed patient back on BIPAP but O2 sats dropped.  Have had to take patient up to 80%, sats are holding at 90%.  Will continue to monitor and wean FIO2 as tolerated.

## 2022-04-16 NOTE — Progress Notes (Signed)
Patient taken off of BIPAP and placed on 4L nasal cannula and is tolerating well at this time. BIPAP is in room on standby. RN aware.

## 2022-04-16 NOTE — Progress Notes (Signed)
ANTICOAGULATION CONSULT NOTE -   Pharmacy Consult for heparin gtt  Indication: atrial fibrillation  Allergies  Allergen Reactions   Norvasc [Amlodipine] Itching   Parafon Forte Dsc [Chlorzoxazone] Itching    Patient Measurements: Height: '5\' 8"'$  (172.7 cm) Weight: 62.6 kg (138 lb 0.1 oz) IBW/kg (Calculated) : 68.4 HEPARIN DW (KG): 62.6  Vital Signs: Temp: 96.3 F (35.7 C) (10/16 0717) Temp Source: Axillary (10/16 0717) BP: 116/73 (10/16 0717) Pulse Rate: 65 (10/16 0717)  Labs: Recent Labs    04/14/22 0211 04/14/22 0217 04/14/22 1932 04/15/22 0359 04/15/22 0757 04/15/22 1803 04/16/22 0422 04/16/22 0423  HGB 12.6*  --   --  12.3*  --   --   --  11.7*  HCT 42.4  --   --  38.2*  --   --   --  36.7*  PLT 403*  --   --  345  --   --   --  337  HEPARINUNFRC  --   --    < >  --  0.27* 0.37 0.34  --   CREATININE  --  0.61  --  0.82  --   --   --  0.90  CKTOTAL  --   --   --  62  --   --   --   --    < > = values in this interval not displayed.     Estimated Creatinine Clearance: 56 mL/min (by C-G formula based on SCr of 0.9 mg/dL).   Medical History: Past Medical History:  Diagnosis Date   Anxiety    Cataracts, bilateral    removed by surgery   COPD (chronic obstructive pulmonary disease) ([place])    Depression, major, single episode, moderate ([place]) 12/18/2018   Glaucoma    both eyes   Glaucoma    bilateral   H. pylori infection 09/17/2019   S/p treatment with Prevpac. Documented eradiation via breath test on 11/03/19.    Hyperlipidemia    Hypertension    Pre-diabetes    diet controlled   Skin cancer, basal cell    arms, face   Wears dentures    full    Medications:  Medications Prior to Admission  Medication Sig Dispense Refill Last Dose   albuterol (PROVENTIL) (2.5 MG/3ML) 0.083% nebulizer solution Take 2.5 mg by nebulization every 6 (six) hours as needed for wheezing.   04/29/2022   albuterol (VENTOLIN HFA) 108 (90 Base) MCG/ACT inhaler Inhale 2 puffs  into the lungs every 4 (four) hours as needed. 54 g 3 04/11/2022   ALPRAZolam (XANAX) 0.5 MG tablet TAKE 1 TABLET BY MOUTH EVERY DAY AT BEDTIME AS NEEDED 30 tablet 5 04/11/2022   Ascorbic Acid (VITAMIN C PO) Take 1 tablet by mouth daily.   04/11/2022   aspirin EC 81 MG tablet Take 81 mg by mouth daily.   Past Week   budesonide-formoterol (SYMBICORT) 160-4.5 MCG/ACT inhaler INHALE TWO PUFFS BY MOUTH TWICE DAILY ** STOP SPIRIVA ** 1 each 6 04/19/2022   docusate sodium (COLACE) 100 MG capsule Take 100 mg by mouth 2 (two) times daily as needed for mild constipation.   04/26/2022   dorzolamide-timolol (COSOPT) 22.3-6.8 MG/ML ophthalmic solution Place 1 drop into both eyes 2 (two) times daily. 10 mL 0 04/07/2022   Fluticasone-Umeclidin-Vilant (TRELEGY ELLIPTA) 100-62.5-25 MCG/ACT AEPB Inhale 1 puff into the lungs daily. 60 each 0 04/11/2022   ipratropium-albuterol (DUONEB) 0.5-2.5 (3) MG/3ML SOLN USE 3 ML VIA NEBULIZER TWICE DAILY 180 mL 6 04/24/2022  mirtazapine (REMERON SOL-TAB) 30 MG disintegrating tablet Take 1 tablet (30 mg total) by mouth at bedtime. 30 tablet 5 04/11/2022   Multiple Vitamins-Minerals (IMMUNE SUPPORT PO) Take 1 tablet by mouth daily.   04/11/2022   OXYGEN Inhale 4 L into the lungs continuous. 4lpm   04/19/2022   pravastatin (PRAVACHOL) 40 MG tablet Take 1 tablet (40 mg total) by mouth at bedtime. 90 tablet 1 04/11/2022   Saw Palmetto 450 MG CAPS Take 450 mg by mouth at bedtime.   04/11/2022   Scheduled:   arformoterol  15 mcg Nebulization BID   ascorbic acid  500 mg Oral Daily   aspirin EC  81 mg Oral Daily   budesonide (PULMICORT) nebulizer solution  0.5 mg Nebulization BID   Chlorhexidine Gluconate Cloth  6 each Topical Daily   dorzolamide-timolol  1 drop Both Eyes BID   guaiFENesin  600 mg Oral BID   ipratropium-albuterol  3 mL Nebulization Q6H   methylPREDNISolone (SOLU-MEDROL) injection  40 mg Intravenous Q12H   mirtazapine  30 mg Oral QHS   mouth rinse  15 mL Mouth  Rinse Q2H   pravastatin  40 mg Oral QHS   sodium chloride flush  10-40 mL Intracatheter Q12H   Infusions:   sodium chloride 10 mL/hr at 04/15/22 0644   amiodarone Stopped (04/15/22 1306)   ceFEPime (MAXIPIME) IV 2 g (04/15/22 2215)   dexmedetomidine (PRECEDEX) IV infusion 0.4 mcg/kg/hr (04/15/22 1849)   fentaNYL infusion INTRAVENOUS Stopped (04/15/22 1307)   heparin 1,100 Units/hr (04/15/22 0950)   norepinephrine (LEVOPHED) Adult infusion 3 mcg/min (04/15/22 1847)   vancomycin 500 mg (04/15/22 2056)   PRN: acetaminophen **OR** acetaminophen, ALPRAZolam, ondansetron **OR** ondansetron (ZOFRAN) IV, mouth rinse, sodium chloride flush Anti-infectives (From admission, onward)    Start     Dose/Rate Route Frequency Ordered Stop   04/14/22 2100  vancomycin (VANCOREADY) IVPB 500 mg/100 mL        500 mg 100 mL/hr over 60 Minutes Intravenous Every 12 hours 04/14/22 0929     04/14/22 1015  vancomycin (VANCOCIN) IVPB 1000 mg/200 mL premix        1,000 mg 200 mL/hr over 60 Minutes Intravenous  Once 04/14/22 0928 04/14/22 1116   04/14/22 1015  ceFEPIme (MAXIPIME) 2 g in sodium chloride 0.9 % 100 mL IVPB        2 g 200 mL/hr over 30 Minutes Intravenous Every 12 hours 04/14/22 0928     04/13/22 1600  azithromycin (ZITHROMAX) 500 mg in sodium chloride 0.9 % 250 mL IVPB  Status:  Discontinued        500 mg 250 mL/hr over 60 Minutes Intravenous Every 24 hours 04/02/2022 1700 04/14/22 0908   04/13/22 1000  cefTRIAXone (ROCEPHIN) 1 g in sodium chloride 0.9 % 100 mL IVPB  Status:  Discontinued        1 g 200 mL/hr over 30 Minutes Intravenous Every 24 hours 04/23/2022 1516 04/14/22 0908   04/23/2022 1315  cefTRIAXone (ROCEPHIN) 1 g in sodium chloride 0.9 % 100 mL IVPB        1 g 200 mL/hr over 30 Minutes Intravenous  Once 04/22/2022 1312 04/16/2022 1437   04/30/2022 1315  azithromycin (ZITHROMAX) 500 mg in sodium chloride 0.9 % 250 mL IVPB        500 mg 250 mL/hr over 60 Minutes Intravenous  Once 04/25/2022 1312  04/25/2022 1654       Assessment: George Meyer a 81 y.o. male requires anticoagulation with a  heparin iv infusion for the indication of new onset atrial fibrillation. Heparin gtt will be started following pharmacy protocol per pharmacy consult. Patient is not on previous oral anticoagulant.  HL is 0.34, remains therapeutic  Goal of Therapy:  Heparin level 0.3-0.7 units/ml Monitor platelets by anticoagulation protocol: Yes   Plan:  Continue heparin infusion at 1100 units/hr Check anti-Xa level daily while on heparin Continue to monitor H&H and platelets F/U transition to po anticoagulation  Isac Sarna, BS Pharm D, BCPS Clinical Pharmacist 04/16/2022,7:44 AM

## 2022-04-16 NOTE — Progress Notes (Signed)
2200 10/15 Blood gas was mixed venous/ arterial sample by RT.

## 2022-04-16 NOTE — Progress Notes (Signed)
Morning venous blood gas requested by e - link nurse through Dr Earnest Conroy  ( hospitalitis ) . Showed PH 7.38 , PCO2 50 , PO2 56.

## 2022-04-17 ENCOUNTER — Other Ambulatory Visit (HOSPITAL_COMMUNITY): Payer: PPO

## 2022-04-17 ENCOUNTER — Inpatient Hospital Stay (HOSPITAL_COMMUNITY): Payer: PPO

## 2022-04-17 DIAGNOSIS — I10 Essential (primary) hypertension: Secondary | ICD-10-CM | POA: Diagnosis not present

## 2022-04-17 DIAGNOSIS — J189 Pneumonia, unspecified organism: Secondary | ICD-10-CM | POA: Diagnosis not present

## 2022-04-17 DIAGNOSIS — I469 Cardiac arrest, cause unspecified: Secondary | ICD-10-CM

## 2022-04-17 DIAGNOSIS — J9621 Acute and chronic respiratory failure with hypoxia: Secondary | ICD-10-CM | POA: Diagnosis not present

## 2022-04-17 LAB — GLUCOSE, CAPILLARY
Glucose-Capillary: 105 mg/dL — ABNORMAL HIGH (ref 70–99)
Glucose-Capillary: 119 mg/dL — ABNORMAL HIGH (ref 70–99)

## 2022-04-17 LAB — CBC
HCT: 34.2 % — ABNORMAL LOW (ref 39.0–52.0)
Hemoglobin: 11 g/dL — ABNORMAL LOW (ref 13.0–17.0)
MCH: 32.3 pg (ref 26.0–34.0)
MCHC: 32.2 g/dL (ref 30.0–36.0)
MCV: 100.3 fL — ABNORMAL HIGH (ref 80.0–100.0)
Platelets: 407 10*3/uL — ABNORMAL HIGH (ref 150–400)
RBC: 3.41 MIL/uL — ABNORMAL LOW (ref 4.22–5.81)
RDW: 17.1 % — ABNORMAL HIGH (ref 11.5–15.5)
WBC: 31 10*3/uL — ABNORMAL HIGH (ref 4.0–10.5)
nRBC: 0 % (ref 0.0–0.2)

## 2022-04-17 LAB — CULTURE, BLOOD (ROUTINE X 2)
Culture: NO GROWTH
Culture: NO GROWTH
Special Requests: ADEQUATE

## 2022-04-17 LAB — HEPARIN LEVEL (UNFRACTIONATED): Heparin Unfractionated: <0.1 IU/mL — ABNORMAL LOW (ref 0.30–0.70)

## 2022-04-17 MED ORDER — ORAL CARE MOUTH RINSE: 15.0000 mL | OROMUCOSAL | Status: DC

## 2022-04-17 MED ORDER — NOREPINEPHRINE 4 MG/250ML-% IV SOLN
0.0000 ug/min | INTRAVENOUS | Status: DC
  Administered 2022-04-17: 2 ug/min via INTRAVENOUS
  Filled 2022-04-17: qty 250

## 2022-04-17 MED ORDER — ORAL CARE MOUTH RINSE: 15.0000 mL | OROMUCOSAL | Status: DC | PRN

## 2022-04-17 MED ORDER — SODIUM CHLORIDE 0.9 % IV BOLUS
500.0000 mL | Freq: Once | INTRAVENOUS | Status: AC
  Administered 2022-04-17: 500 mL via INTRAVENOUS

## 2022-04-18 ENCOUNTER — Ambulatory Visit: Payer: PPO | Admitting: Pulmonary Disease

## 2022-04-20 ENCOUNTER — Telehealth: Payer: Self-pay | Admitting: Pulmonary Disease

## 2022-04-20 NOTE — Telephone Encounter (Signed)
Expressed my condolences to [place]

## 2022-04-30 ENCOUNTER — Telehealth: Payer: PPO | Admitting: Family Medicine

## 2022-05-02 ENCOUNTER — Ambulatory Visit: Payer: PPO | Admitting: Pulmonary Disease

## 2022-05-02 NOTE — Progress Notes (Signed)
Removed IV from left [facility]

## 2022-05-02 NOTE — ED Provider Notes (Signed)
I was called to a code and ICU around 730-7:45 AM.  Patient was being bagged he was apneic and pulseless.  The patient was intubated immediately and good breath sounds were noted bilaterally.  He still did not have a pulse and atropine and epinephrine were given.  Appropriate ACLS continued and the code was then taken over by the hospitalist.   Milton Ferguson, MD 16-May-2022 [phone]

## 2022-05-02 NOTE — Progress Notes (Signed)
Patient taken off Bipap and placed back on 6L [place] humidified oxygen.  Patient sat is 96%.  Will continue to monitor.

## 2022-05-02 NOTE — Progress Notes (Signed)
Code blue called upon arrival CPR in progress. Intubated with 7.5 oral ETT secured at 25 cm positive color change on ETCO2 Bilat BS equal and course.  CPR continued until time of death with 100% O2 delivered via ambu bag.

## 2022-05-02 NOTE — Death Summary Note (Signed)
DEATH SUMMARY   Patient Details  Name: George Meyer MRN: [id number] DOB: Apr 17, 1941 [id number], George Snare, MD Admission/Discharge Information   Admit Date:  30-Apr-2022  Date of Death: Date of Death: 05/05/22  Time of Death: Time of Death: 0817  Length of Stay: 5   Principle Cause of death: PEA arrest.  Hospital Diagnoses: Principal Problem:   Sepsis due to pneumonia [facility]) Active Problems:   Atrial fibrillation with RVR (HCC)   HTN (hypertension)   Centrilobular emphysema (HCC)   Hypernatremia   Hyperlipidemia   Glaucoma   GERD (gastroesophageal reflux disease)   Generalized anxiety disorder   Pulmonary nodules   Hospital Course: Mr. Meyer was admitted to the hospital with the working diagnosis of pneumonia, complicated with septic shock and atrial fibrillation with RVR  81 yo male with the past medical history of depression, hypertension, dyslipidemia, and COPD who presented with tachycardia. Patient reported chest pain, cough and dyspnea. Productive cough. Patient was evaluated by his primary care provider and was referred to the ED for further evaluation. His blood pressure was 128/78, HR 95, RR 29 and 02 saturation 98%, lungs with bilateral rhonchi, mild expiratory wheezing, heart with S1 and S2 present and rhythmic, abdomen not distended and no lower extremity edema.   Na 146, K 4,5 Cl 98 bicarbonate 39, glucose 111, bun 18 cr 0,63  Wbc 14, hgb 14.3 plt 380  D dimer 14,8 Sars covid 19 negative  Urine analysis SG >1.046, negative protein, negative leukocytes.   Chest radiograph with left lower lobe infiltrate with left pleural effusion.   Chest CT with centrilobular emphysema, with patchy infiltrate left lower and upper lobe, interlobular septal thickening. Positive left pleural effusion.  Two solid pulmonary nodules (increased in size) right upper lobe, enlarged mediastinal lymph nodes and new sclerotic lesion of T 12.   EKG SVT 170 bpm, right axis  deviation, right bundle branch block, SVT rhythm with no significant ST segment or T wave changes.   Patient placed on supplemental 02 per [place] and antibiotic therapy.   Assessment and Plan: *PEA arrest  -Patient experience code while in the ICU around 7:45 AM on 2022-05-05. -Patient with episode of bradycardia, syncope and subsequent asystole. -No pulses and hypoxia noted. -Appropriate ACLS initiated and given pulseless and apnea patient was intubated; continue adequate CPR, epinephrine and atropine were provided; after 30 minutes of resuscitation ROSC was not achieved and resuscitation efforts terminated. -patient declared death at 8:17 am.  sepsis due to pneumonia [facility]) -Septic shock due to community acquired pneumonia (presetn on admission).  -Lactic acidosis -Patient has improved hemodynamically and was off amiodarone and norepinephrine for > 24 hours.   -rhythm and BP were stable. -Blood culture without growth; no fever and now having elevated WBCs secondary to steroids. -Patient was kept on adequate broad-spectrum IV antibiotics -He was very frail and with weak cough; making him high risk for requiring reintubation.  Atrial fibrillation with RVR (HCC) -No longer hypotensive -Demonstrating bradycardia with irregular beating -Patient with a past history of A-fib; anticipate worsening secondary to sepsis and pneumonia. -Amiodarone was discontinued in the setting of control reason with episodes of sinus bradycardia.  Rate and rhythm stabilize after intervention. -Patient heparin drip was discontinue in the setting of hematuria. -Follow heart rate and response. -Chads Vasc score 2; was on aspirin only prior to admission. -planning to pursuit echocardiogram on the day he passed away.  HTN (hypertension) -Overall stabilized without supports.  Centrilobular emphysema (HCC) -Respiratory failure improved  with mechanical ventilation -Successfully extubated on 04/15/2022; with transient  desaturation and hypercapnic process that was able to be fixed with BiPAP usage. -Remains a high risk for requiring intubation with weak cough and overall frail stability to protect airways. -Requiring 5 L second supplementation currently. -Continue steroids and bronchodilator management -Pulmonology service consulted.  Hypernatremia -Improved after fluid resuscitation given. -Continue to follow electrolytes trend. -Patient was n.p.o; in the process to assess bedside swallow  capability and speech therapy evaluation. -If needed will get speech therapy consultation.  Hyperlipidemia -Kept on pravastatin   Glaucoma -Affecting both eyes -Continue outpatient follow-up with PCP/ophthalmologist -Continue the use of Cosopt eyedrops.  GERD (gastroesophageal reflux disease) -On PPI.  Generalized anxiety disorder -Patient to continued the use of mirtazapine and alprazolam.   Pulmonary nodules -Worsening pulmonary nodules, with mediastinal lymph nodes -along with sclerotic lesion in T 12.  -High pre test probability for malignancy.  -After discussion with pulmonology service plan was to  attempt ultrasound thoracentesis of appreciated pleural effusion on his chest images. -Continue current antibiotic therapy.  Procedures: See below for x-ray report.  Consultations: PCCM  The results of significant diagnostics from this hospitalization (including imaging, microbiology, ancillary and laboratory) are listed below for reference.   Significant Diagnostic Studies: US Venous Img Upper Uni Right(DVT)  Result Date: 05-14-22 CLINICAL DATA:  Right upper extremity edema. EXAM: RIGHT UPPER EXTREMITY VENOUS DOPPLER ULTRASOUND TECHNIQUE: Gray-scale sonography with graded compression, as well as color Doppler and duplex ultrasound were performed to evaluate the upper extremity deep venous system from the level of the subclavian vein and including the jugular, axillary, basilic, radial, ulnar and  upper cephalic vein. Spectral Doppler was utilized to evaluate flow at rest and with distal augmentation maneuvers. COMPARISON:  None Available. FINDINGS: Contralateral Subclavian Vein: Respiratory phasicity is normal and symmetric with the symptomatic side. No evidence of thrombus. Normal compressibility. Internal Jugular Vein: No evidence of thrombus. Normal compressibility, respiratory phasicity and response to augmentation. Subclavian Vein: No evidence of thrombus. Normal compressibility, respiratory phasicity and response to augmentation. Axillary Vein: No evidence of thrombus. Normal compressibility, respiratory phasicity and response to augmentation. Cephalic Vein: No evidence of thrombus. Normal compressibility, respiratory phasicity and response to augmentation. Basilic Vein: No evidence of thrombus. Normal compressibility, respiratory phasicity and response to augmentation. Brachial Veins: No evidence of thrombus. Normal compressibility, respiratory phasicity and response to augmentation. Radial Veins: No evidence of thrombus. Normal compressibility, respiratory phasicity and response to augmentation. Ulnar Veins: No evidence of thrombus. Normal compressibility, respiratory phasicity and response to augmentation. Venous Reflux:  None visualized. Other Findings: No evidence of superficial thrombophlebitis or abnormal fluid collection. IMPRESSION: No evidence of DVT within the right upper extremity. Electronically Signed   By: Aletta Edouard M.D.   On: May 14, 2022 08:01   CT HEAD WO CONTRAST (5MM)  Result Date: 04/15/2022 CLINICAL DATA:  Altered mental status following extubation. EXAM: CT HEAD WITHOUT CONTRAST TECHNIQUE: Contiguous axial images were obtained from the base of the skull through the vertex without intravenous contrast. RADIATION DOSE REDUCTION: This exam was performed according to the departmental dose-optimization program which includes automated exposure control, adjustment of the mA  and/or kV according to patient size and/or use of iterative reconstruction technique. COMPARISON:  CT head 1 day prior FINDINGS: Image quality is degraded by motion and streak artifact. Brain: There is no acute intracranial hemorrhage, extra-axial fluid collection, or acute infarct. There is no definite acute territorial infarct. The ventricles are stable in size. Gray-white differentiation appears preserved. There  is no mass lesion.  There is no mass effect or midline shift. Vascular: No hyperdense vessel or unexpected calcification. Skull: Normal. Negative for fracture or focal lesion. Sinuses/Orbits: The paranasal sinuses are clear. Bilateral lens implants are in place. The globes and orbits are otherwise unremarkable. Other: None. IMPRESSION: No definite evidence of acute intracranial pathology. If there is persistent clinical concern, consider MRI given degraded image quality on the current study. Electronically Signed   By: Valetta Mole M.D.   On: 04/15/2022 14:59   DG CHEST PORT 1 VIEW  Result Date: 04/15/2022 CLINICAL DATA:  Shortness of breath EXAM: PORTABLE CHEST 1 VIEW COMPARISON:  04/14/2021 FINDINGS: Interval endotracheal and esophagogastric extubation. Otherwise unchanged AP portable examination with diffuse bilateral interstitial pulmonary opacity, masslike consolidation of the perihilar left lung, and small, layering pleural effusions. No new airspace opacity. IMPRESSION: 1. Interval endotracheal and esophagogastric extubation. 2. Otherwise unchanged AP portable examination with diffuse bilateral interstitial pulmonary opacity, masslike consolidation of the perihilar left lung, and small, layering pleural effusions. Electronically Signed   By: Delanna Ahmadi M.D.   On: 04/15/2022 14:37   DG Chest Port 1 View  Result Date: 04/14/2022 CLINICAL DATA:  Follow-up exam EXAM: PORTABLE CHEST 1 VIEW COMPARISON:  April 12, 2022 FINDINGS: Endotracheal tube is identified distal tip 6.8 cm from carina.  Nasogastric tube is noted, distal tip not included on the film but is at least in the stomach. Patchy consolidation with increased pulmonary interstitium identified in the left lung slightly worse compared to prior exam. The right lung is hyperinflated. IMPRESSION: Patchy consolidation with increased pulmonary interstitium identified in the left lung slightly worse compared to prior exam. Electronically Signed   By: Abelardo Diesel M.D.   On: 04/14/2022 10:48   [place] EKG SITE RITE  Result Date: 04/14/2022 If Site Rite image not attached, placement could not be confirmed due to current cardiac rhythm.  CT CHEST WO CONTRAST  Result Date: 04/14/2022 CLINICAL DATA:  Chest trauma, blunt.  Fall. EXAM: CT CHEST WITHOUT CONTRAST TECHNIQUE: Multidetector CT imaging of the chest was performed following the standard protocol without IV contrast. RADIATION DOSE REDUCTION: This exam was performed according to the departmental dose-optimization program which includes automated exposure control, adjustment of the mA and/or kV according to patient size and/or use of iterative reconstruction technique. COMPARISON:  04/08/2022. FINDINGS: Cardiovascular: Heart is normal in size and there is a small pericardial effusion. Multi-vessel coronary artery calcifications are noted. There is atherosclerotic calcification of the aorta without evidence of aneurysm. The pulmonary trunk is normal in caliber. Mediastinum/Nodes: Multiple enlarged lymph nodes are present in the mediastinum measuring up to 1.7 cm in the right paratracheal space, unchanged. No axillary lymphadenopathy. Evaluation of the hila is limited due to lack of IV contrast. An endotracheal tube and enteric tubes are noted. Lungs/Pleura: Emphysematous changes are present in the lungs. An 8 mm subsolid nodule is noted in the right upper lobe, axial image 24. There is a 0.9 x 0.7 mm nodule in the right upper lobe, axial image 30. There is a 1.7 x 1.3 cm nodule in the right  upper lobe, axial image 64. Patchy airspace disease and interlobular septal thickening is noted in the left lung. There is consolidation at the left lung base. There is a moderate pleural effusion on the left and small pleural effusion on the right. No pneumothorax. Upper Abdomen: Prominent lymph nodes are present in the gastrohepatic ligament and periaortic space measuring up to 9 mm in short axis  diameter. An enteric tube terminates in the stomach. Musculoskeletal: Subcutaneous emphysema is noted in the clavicular regions bilaterally. Degenerative changes are present in the thoracic spine. There is a stable sclerotic lesion at T2. Stable compression deformities at T4 and T12. No acute osseous abnormality. IMPRESSION: 1. Patchy infiltrates and interlobular septal thickening in the left lung with consolidation at the left lung base, slightly increased from the previous exam, possible pneumonia versus lymphangitic spread of tumor. 2. Multiple pulmonary nodules in the right lung measuring up to 1.7 cm and not significantly changed from the prior exam and are concerning for malignancy. 3. Moderate left pleural effusion and small right pleural effusion. 4. Emphysema. 5. Aortic atherosclerosis. 6. Remaining stable findings as described above. Electronically Signed   By: Brett Fairy M.D.   On: 04/14/2022 03:50   CT HEAD WO CONTRAST (5MM)  Result Date: 04/14/2022 CLINICAL DATA:  Fall EXAM: CT HEAD WITHOUT CONTRAST TECHNIQUE: Contiguous axial images were obtained from the base of the skull through the vertex without intravenous contrast. RADIATION DOSE REDUCTION: This exam was performed according to the departmental dose-optimization program which includes automated exposure control, adjustment of the mA and/or kV according to patient size and/or use of iterative reconstruction technique. COMPARISON:  None Available. FINDINGS: Brain: There is no mass, hemorrhage or extra-axial collection. The size and configuration  of the ventricles and extra-axial CSF spaces are normal. There is hypoattenuation of the white matter, most commonly indicating chronic small vessel disease. Vascular: No abnormal hyperdensity of the major intracranial arteries or dural venous sinuses. No intracranial atherosclerosis. Skull: The visualized skull base, calvarium and extracranial soft tissues are normal. Sinuses/Orbits: No fluid levels or advanced mucosal thickening of the visualized paranasal sinuses. No mastoid or middle ear effusion. The orbits are normal. IMPRESSION: 1. No acute intracranial abnormality. 2. Chronic small vessel disease. Electronically Signed   By: Ulyses Jarred M.D.   On: 04/14/2022 03:46   CT Angio Chest PE W and/or Wo Contrast  Result Date: 04/20/2022 CLINICAL DATA:  Chest pain; * Tracking Code: BO * EXAM: CT ANGIOGRAPHY CHEST WITH CONTRAST TECHNIQUE: Multidetector CT imaging of the chest was performed using the standard protocol during bolus administration of intravenous contrast. Multiplanar CT image reconstructions and MIPs were obtained to evaluate the vascular anatomy. RADIATION DOSE REDUCTION: This exam was performed according to the departmental dose-optimization program which includes automated exposure control, adjustment of the mA and/or kV according to patient size and/or use of iterative reconstruction technique. CONTRAST:  5m OMNIPAQUE IOHEXOL 350 MG/ML SOLN COMPARISON:  Chest CT dated December 30, 2018 FINDINGS: Cardiovascular: No evidence of pulmonary embolus. Normal heart size. Trace pericardial effusion. Normal caliber thoracic aorta with moderate atherosclerotic disease. Mediastinum/Nodes: Moderate circumferential esophageal wall thickening, most pronounced in the lower esophagus enlarged subcarinal lymph node measuring 2.3 cm in short axis on series 5, image 157. Enlarged AP window lymph node measuring 1.3 cm in short axis on image 44. Enlarged pre-vascular lymph node measuring 1.3 cm in short axis on image  34. Lungs/Pleura: Central airways are patent. Bilateral bronchial wall thickening. Peribronchovascular consolidations and atelectasis of the left upper and lower lobes with associated interlobular septal thickening. Moderate left and small right pleural effusions. Solid pulmonary nodule of the right upper lobe measuring 1.6 x 1.4 cm on series 6, image 172, previously measured 3 mm. Spiculated solid pulmonary nodule of the right upper lobe measuring 9 x 7 mm on series 6, image 34, previously measured 4 mm. Upper Abdomen: Simple appearing cyst of  the left kidney. Nonobstructing stone of the right kidney. Musculoskeletal: New sclerotic lesion of T2. Stable tiny sclerotic lesion of T3, likely a bone island new mild age indeterminate compression deformity of T4. Unchanged T7 and L1 mild compression deformities. Review of the MIP images confirms the above findings. IMPRESSION: 1. No evidence of pulmonary embolus. 2. Peribronchovascular consolidation and atelectasis of the left upper and lower lobes with associated interlobular septal thickening, possibly due to pneumonia, although lymphangitic spread of tumor could also have this appearance. 3. Two solid pulmonary nodules of the right upper lobe, both are increased in size when compared with prior exam and highly concerning for primary lung malignancy. 4. Moderate left and small right pleural effusions. 5. Enlarged mediastinal lymph nodes, concerning for metastatic disease. 6. New sclerotic lesion of T2, concerning for osseous metastatic disease. 7. Aortic Atherosclerosis (ICD10-I70.0). Electronically Signed   By: Yetta Glassman M.D.   On: 04/22/2022 15:16   DG Chest 2 View  Result Date: 04/21/2022 CLINICAL DATA:  SVT, chest pain EXAM: CHEST - 2 VIEW COMPARISON:  September 22, 2021. FINDINGS: Interstitial and patchy airspace opacities in the left lung. Small left pleural effusion. No visible pneumothorax. Cardiomediastinal silhouette is within normal limits for  technique. Patient is rotated to the left. IMPRESSION: 1. Interstitial and patchy airspace opacities in the left lung, suspicious for pneumonia or asymmetric edema. Followup PA and lateral chest X-ray is recommended in 3-4 weeks following trial of antibiotic therapy to ensure resolution and exclude underlying malignancy. 2. Small left pleural effusion. Electronically Signed   By: Margaretha Sheffield M.D.   On: 04/04/2022 12:55    Microbiology: Recent Results (from the past 240 hour(s))  Resp Panel by RT-PCR (Flu A&B, Covid) Anterior Nasal Swab     Status: None   Collection Time: 04/19/2022 12:29 PM   Specimen: Anterior Nasal Swab  Result Value Ref Range Status   SARS Coronavirus 2 by RT PCR NEGATIVE NEGATIVE Final    Comment: (NOTE) SARS-CoV-2 target nucleic acids are NOT DETECTED.  The SARS-CoV-2 RNA is generally detectable in upper respiratory specimens during the acute phase of infection. The lowest concentration of SARS-CoV-2 viral copies this assay can detect is 138 copies/mL. A negative result does not preclude SARS-Cov-2 infection and should not be used as the sole basis for treatment or other patient management decisions. A negative result may occur with  improper specimen collection/handling, submission of specimen other than nasopharyngeal swab, presence of viral mutation(s) within the areas targeted by this assay, and inadequate number of viral copies(<138 copies/mL). A negative result must be combined with clinical observations, patient history, and epidemiological information. The expected result is Negative.  Fact Sheet for Patients:  EntrepreneurPulse.com.au  Fact Sheet for Healthcare Providers:  IncredibleEmployment.be  This test is no t yet approved or cleared by the [place] FDA and  has been authorized for detection and/or diagnosis of SARS-CoV-2 by FDA under an Emergency Use Authorization (EUA). This EUA will remain  in effect  (meaning this test can be used) for the duration of the COVID-19 declaration under Section 564(b)(1) of the Act, 21 U.S.C.section 360bbb-3(b)(1), unless the authorization is terminated  or revoked sooner.       Influenza A by PCR NEGATIVE NEGATIVE Final   Influenza B by PCR NEGATIVE NEGATIVE Final    Comment: (NOTE) The Xpert Xpress SARS-CoV-2/FLU/RSV plus assay is intended as an aid in the diagnosis of influenza from Nasopharyngeal swab specimens and should not be used as a sole basis  for treatment. Nasal washings and aspirates are unacceptable for Xpert Xpress SARS-CoV-2/FLU/RSV testing.  Fact Sheet for Patients: EntrepreneurPulse.com.au  Fact Sheet for Healthcare Providers: IncredibleEmployment.be  This test is not yet approved or cleared by the [place] FDA and has been authorized for detection and/or diagnosis of SARS-CoV-2 by FDA under an Emergency Use Authorization (EUA). This EUA will remain in effect (meaning this test can be used) for the duration of the COVID-19 declaration under Section 564(b)(1) of the Act, 21 U.S.C. section 360bbb-3(b)(1), unless the authorization is terminated or revoked.  Performed at [facility], [address], [place], [place] [address]   Culture, blood (routine x 2)     Status: None (Preliminary result)   Collection Time: 04/03/2022  1:43 PM   Specimen: BLOOD  Result Value Ref Range Status   Specimen Description BLOOD LEFT ASSIST CONTROL  Final   Special Requests   Final    Blood Culture results may not be optimal due to an excessive volume of blood received in culture bottles BOTTLES DRAWN AEROBIC AND ANAEROBIC   Culture   Final    NO GROWTH 4 DAYS Performed at [facility], [address] [place], [place] [address]    Report Status PENDING  Incomplete  Culture, blood (routine x 2)     Status: None (Preliminary result)   Collection Time: 04/29/2022  1:43 PM   Specimen: BLOOD  Result Value Ref  Range Status   Specimen Description BLOOD RIGHT HAND  Final   Special Requests   Final    BOTTLES DRAWN AEROBIC AND ANAEROBIC Blood Culture adequate volume   Culture   Final    NO GROWTH 4 DAYS Performed at [facility], [address], [place], [place] [address]    Report Status PENDING  Incomplete  MRSA Next Gen by PCR, Nasal     Status: None   Collection Time: 04/16/22 11:15 AM   Specimen: Nasal Mucosa; Nasal Swab  Result Value Ref Range Status   MRSA by PCR Next Gen NOT DETECTED NOT DETECTED Final    Comment: (NOTE) The GeneXpert MRSA Assay (FDA approved for NASAL specimens only), is one component of a comprehensive MRSA colonization surveillance program. It is not intended to diagnose MRSA infection nor to guide or monitor treatment for MRSA infections. Test performance is not FDA approved in patients less than 75 years old. Performed at [facility], [address], [place], [place] [address]     Time spent: 40 minutes  Signed: Barton Dubois, MD Apr 30, 2022

## 2022-05-02 NOTE — Progress Notes (Addendum)
Patient sat has been going up and down since placement of HFNC.  Went up to 9L and current sat is 96%.  Also attached cord to ear probe and sats have registered better.

## 2022-05-02 NOTE — Progress Notes (Signed)
Patient's mask has been bothering him off and on all night.  Changed mask from medium to large but kept his medium straps.  Seems to be working better for patient and not pushing on his nose as bad as before.  Will continue to monitor.

## 2022-05-02 NOTE — Progress Notes (Signed)
Received phone call from ICU Nurse Tech that patient had coded and had passed. Daughter was present bedside. Chaplain found George Meyer, patient daughter sitting on couch inside ICU room and very tearful. Assisted her in talking about how her father influenced her life and she shared stories about his upbringing, their relationship, his illness story, and spiritual heritage. She shared that George Meyer told her a few days ago that he was 'going' and last night he told her he was 'going home'. She felt at peace with leaving him last night and although she was shocked when she came in this morning after he coded and passed, she is accepting and grateful that he is now not suffering anymore. Chaplain provided space for her to spend with her father and answered questions; provided closing prayer bedside. Will remain available in order to provide spiritual support and to assess for spiritual need.   Rev. Bennie Pierini, M.Div Chaplain

## 2022-05-02 NOTE — Progress Notes (Signed)
Code blue called due to patient going bradycardic to asystole and shallow breath to no breath being taken. CPR started and respiratory in room and started providing respirations (ambu-bag). ACLS meds given per Dr Dyann Kief and CPR and pulse checks continued. Time of death called by Dr Dyann Kief.

## 2022-05-02 DEATH — deceased

## 2022-05-16 ENCOUNTER — Telehealth: Payer: PPO | Admitting: Family Medicine

## 2022-05-30 ENCOUNTER — Telehealth: Payer: PPO | Admitting: Family Medicine

## 2022-06-21 ENCOUNTER — Ambulatory Visit: Payer: PPO | Admitting: Pulmonary Disease
